# Patient Record
Sex: Female | Born: 1935 | Race: Black or African American | Hispanic: No | Marital: Married | Smoking: Former smoker
Health system: Southern US, Community
[De-identification: ages and names within clinical notes are randomized; demographics above are authoritative.]

## PROBLEM LIST (undated history)

## (undated) DIAGNOSIS — L8990 Pressure ulcer of unspecified site, unspecified stage: Secondary | ICD-10-CM

## (undated) DIAGNOSIS — M1990 Unspecified osteoarthritis, unspecified site: Secondary | ICD-10-CM

## (undated) DIAGNOSIS — I10 Essential (primary) hypertension: Secondary | ICD-10-CM

## (undated) DIAGNOSIS — R350 Frequency of micturition: Secondary | ICD-10-CM

## (undated) DIAGNOSIS — F0390 Unspecified dementia without behavioral disturbance: Secondary | ICD-10-CM

## (undated) DIAGNOSIS — K219 Gastro-esophageal reflux disease without esophagitis: Secondary | ICD-10-CM

## (undated) DIAGNOSIS — E785 Hyperlipidemia, unspecified: Secondary | ICD-10-CM

## (undated) DIAGNOSIS — N39498 Other specified urinary incontinence: Secondary | ICD-10-CM

## (undated) DIAGNOSIS — S31000A Unspecified open wound of lower back and pelvis without penetration into retroperitoneum, initial encounter: Secondary | ICD-10-CM

## (undated) DIAGNOSIS — T7840XA Allergy, unspecified, initial encounter: Secondary | ICD-10-CM

## (undated) DIAGNOSIS — F419 Anxiety disorder, unspecified: Secondary | ICD-10-CM

## (undated) HISTORY — PX: CHOLECYSTECTOMY: SHX55

## (undated) HISTORY — DX: Hyperlipidemia, unspecified: E78.5

## (undated) HISTORY — DX: Anxiety disorder, unspecified: F41.9

## (undated) HISTORY — PX: APPENDECTOMY: SHX54

## (undated) HISTORY — PX: ABDOMINAL HYSTERECTOMY: SHX81

## (undated) HISTORY — PX: COLON SURGERY: SHX602

## (undated) HISTORY — DX: Essential (primary) hypertension: I10

## (undated) HISTORY — DX: Allergy, unspecified, initial encounter: T78.40XA

## (undated) HISTORY — DX: Unspecified osteoarthritis, unspecified site: M19.90

## (undated) HISTORY — PX: COLONOSCOPY W/ POLYPECTOMY: SHX1380

---

## 1991-03-16 HISTORY — PX: BREAST SURGERY: SHX581

## 1998-03-15 HISTORY — PX: BACK SURGERY: SHX140

## 1998-08-16 ENCOUNTER — Emergency Department (HOSPITAL_COMMUNITY): Admission: EM | Admit: 1998-08-16 | Discharge: 1998-08-16 | Payer: Self-pay | Admitting: Emergency Medicine

## 1998-08-20 ENCOUNTER — Encounter: Payer: Self-pay | Admitting: Orthopedic Surgery

## 1998-08-21 ENCOUNTER — Inpatient Hospital Stay (HOSPITAL_COMMUNITY): Admission: RE | Admit: 1998-08-21 | Discharge: 1998-08-23 | Payer: Self-pay | Admitting: Orthopedic Surgery

## 1998-08-21 ENCOUNTER — Encounter: Payer: Self-pay | Admitting: Orthopedic Surgery

## 1999-05-06 ENCOUNTER — Encounter: Payer: Self-pay | Admitting: Orthopedic Surgery

## 1999-05-06 ENCOUNTER — Ambulatory Visit (HOSPITAL_COMMUNITY): Admission: RE | Admit: 1999-05-06 | Discharge: 1999-05-06 | Payer: Self-pay | Admitting: Orthopedic Surgery

## 1999-05-20 ENCOUNTER — Ambulatory Visit (HOSPITAL_COMMUNITY): Admission: RE | Admit: 1999-05-20 | Discharge: 1999-05-20 | Payer: Self-pay | Admitting: Orthopedic Surgery

## 1999-05-20 ENCOUNTER — Encounter: Payer: Self-pay | Admitting: Orthopedic Surgery

## 1999-06-12 ENCOUNTER — Encounter: Payer: Self-pay | Admitting: Orthopedic Surgery

## 1999-06-12 ENCOUNTER — Ambulatory Visit (HOSPITAL_COMMUNITY): Admission: RE | Admit: 1999-06-12 | Discharge: 1999-06-12 | Payer: Self-pay | Admitting: Orthopedic Surgery

## 2001-08-02 ENCOUNTER — Ambulatory Visit (HOSPITAL_COMMUNITY): Admission: RE | Admit: 2001-08-02 | Discharge: 2001-08-02 | Payer: Self-pay | Admitting: Gastroenterology

## 2007-06-14 LAB — CONVERTED CEMR LAB: Pap Smear: NORMAL

## 2008-05-28 ENCOUNTER — Encounter: Payer: Self-pay | Admitting: Family Medicine

## 2008-07-10 ENCOUNTER — Ambulatory Visit: Payer: Self-pay | Admitting: Family Medicine

## 2008-07-10 DIAGNOSIS — R32 Unspecified urinary incontinence: Secondary | ICD-10-CM

## 2008-07-10 DIAGNOSIS — I10 Essential (primary) hypertension: Secondary | ICD-10-CM | POA: Insufficient documentation

## 2008-07-10 DIAGNOSIS — E785 Hyperlipidemia, unspecified: Secondary | ICD-10-CM

## 2008-07-10 LAB — CONVERTED CEMR LAB
Bilirubin Urine: NEGATIVE
Nitrite: NEGATIVE
Urobilinogen, UA: 0.2

## 2008-08-05 ENCOUNTER — Ambulatory Visit: Payer: Self-pay | Admitting: Family Medicine

## 2008-08-06 ENCOUNTER — Encounter: Payer: Self-pay | Admitting: Family Medicine

## 2008-09-20 ENCOUNTER — Telehealth: Payer: Self-pay | Admitting: Family Medicine

## 2008-09-20 ENCOUNTER — Ambulatory Visit: Payer: Self-pay | Admitting: Family Medicine

## 2008-09-20 DIAGNOSIS — M169 Osteoarthritis of hip, unspecified: Secondary | ICD-10-CM

## 2008-09-20 DIAGNOSIS — M1610 Unilateral primary osteoarthritis, unspecified hip: Secondary | ICD-10-CM | POA: Insufficient documentation

## 2008-09-23 ENCOUNTER — Telehealth: Payer: Self-pay | Admitting: Family Medicine

## 2008-09-23 LAB — CONVERTED CEMR LAB
Alkaline Phosphatase: 68 units/L (ref 39–117)
BUN: 9 mg/dL (ref 6–23)
Bilirubin, Direct: 0 mg/dL (ref 0.0–0.3)
CO2: 31 meq/L (ref 19–32)
Chloride: 105 meq/L (ref 96–112)
Creatinine, Ser: 0.6 mg/dL (ref 0.4–1.2)
Glucose, Bld: 93 mg/dL (ref 70–99)
Total CHOL/HDL Ratio: 4
VLDL: 26 mg/dL (ref 0.0–40.0)

## 2008-10-03 ENCOUNTER — Encounter: Admission: RE | Admit: 2008-10-03 | Discharge: 2008-10-03 | Payer: Self-pay | Admitting: Orthopedic Surgery

## 2008-10-03 ENCOUNTER — Encounter: Payer: Self-pay | Admitting: Family Medicine

## 2008-11-13 ENCOUNTER — Telehealth: Payer: Self-pay | Admitting: Family Medicine

## 2008-11-26 ENCOUNTER — Encounter: Payer: Self-pay | Admitting: *Deleted

## 2008-11-27 ENCOUNTER — Ambulatory Visit: Payer: Self-pay | Admitting: Family Medicine

## 2008-12-09 ENCOUNTER — Telehealth: Payer: Self-pay | Admitting: Family Medicine

## 2009-01-09 ENCOUNTER — Ambulatory Visit: Payer: Self-pay | Admitting: Family Medicine

## 2009-01-10 LAB — CONVERTED CEMR LAB
LDL Cholesterol: 78 mg/dL (ref 0–99)
VLDL: 18.4 mg/dL (ref 0.0–40.0)

## 2009-01-16 ENCOUNTER — Telehealth (INDEPENDENT_AMBULATORY_CARE_PROVIDER_SITE_OTHER): Payer: Self-pay | Admitting: *Deleted

## 2009-01-27 ENCOUNTER — Telehealth: Payer: Self-pay | Admitting: Family Medicine

## 2009-02-20 ENCOUNTER — Ambulatory Visit: Payer: Self-pay | Admitting: Family Medicine

## 2009-04-04 ENCOUNTER — Telehealth: Payer: Self-pay | Admitting: Family Medicine

## 2009-04-25 ENCOUNTER — Ambulatory Visit: Payer: Self-pay | Admitting: Family Medicine

## 2009-04-25 DIAGNOSIS — R609 Edema, unspecified: Secondary | ICD-10-CM

## 2009-04-25 LAB — CONVERTED CEMR LAB
Bilirubin Urine: NEGATIVE
Glucose, Urine, Semiquant: NEGATIVE
Urobilinogen, UA: 0.2
pH: 6.5

## 2009-05-21 ENCOUNTER — Telehealth (INDEPENDENT_AMBULATORY_CARE_PROVIDER_SITE_OTHER): Payer: Self-pay | Admitting: *Deleted

## 2009-05-22 ENCOUNTER — Ambulatory Visit: Payer: Self-pay | Admitting: Family Medicine

## 2009-06-20 ENCOUNTER — Ambulatory Visit: Payer: Self-pay | Admitting: Family Medicine

## 2009-10-02 ENCOUNTER — Telehealth: Payer: Self-pay | Admitting: Family Medicine

## 2009-10-17 ENCOUNTER — Ambulatory Visit: Payer: Self-pay | Admitting: Family Medicine

## 2009-10-17 LAB — CONVERTED CEMR LAB: Cholesterol, target level: 200 mg/dL

## 2009-11-14 ENCOUNTER — Ambulatory Visit: Payer: Self-pay | Admitting: Family Medicine

## 2009-11-14 LAB — CONVERTED CEMR LAB
ALT: 11 U/L
AST: 18 U/L
Albumin: 3.8 g/dL
Alkaline Phosphatase: 59 U/L
BUN: 12 mg/dL
Bilirubin, Direct: 0.1 mg/dL
CO2: 31 meq/L
Calcium: 8.9 mg/dL
Chloride: 103 meq/L
Cholesterol: 172 mg/dL
Creatinine, Ser: 0.7 mg/dL
GFR calc non Af Amer: 105.52 mL/min
Glucose, Bld: 82 mg/dL
HDL: 57.5 mg/dL
LDL Cholesterol: 102 mg/dL — ABNORMAL HIGH
Potassium: 3.7 meq/L
Sodium: 140 meq/L
Total Bilirubin: 0.6 mg/dL
Total CHOL/HDL Ratio: 3
Total Protein: 6.4 g/dL
Triglycerides: 64 mg/dL
VLDL: 12.8 mg/dL

## 2009-11-20 ENCOUNTER — Encounter: Payer: Self-pay | Admitting: Family Medicine

## 2009-11-20 LAB — HM MAMMOGRAPHY

## 2009-12-15 ENCOUNTER — Telehealth: Payer: Self-pay | Admitting: Family Medicine

## 2009-12-16 ENCOUNTER — Ambulatory Visit: Payer: Self-pay | Admitting: Family Medicine

## 2009-12-16 DIAGNOSIS — N318 Other neuromuscular dysfunction of bladder: Secondary | ICD-10-CM

## 2009-12-16 LAB — CONVERTED CEMR LAB
Bilirubin Urine: NEGATIVE
Blood in Urine, dipstick: NEGATIVE
Ketones, urine, test strip: NEGATIVE
Protein, U semiquant: NEGATIVE
Urobilinogen, UA: 0.2

## 2009-12-30 ENCOUNTER — Telehealth: Payer: Self-pay | Admitting: Family Medicine

## 2010-01-16 ENCOUNTER — Ambulatory Visit: Payer: Self-pay | Admitting: Family Medicine

## 2010-01-27 ENCOUNTER — Ambulatory Visit: Payer: Self-pay | Admitting: Family Medicine

## 2010-01-29 LAB — CONVERTED CEMR LAB
OCCULT 1: NEGATIVE
OCCULT 2: NEGATIVE
OCCULT 3: NEGATIVE

## 2010-03-23 ENCOUNTER — Ambulatory Visit
Admission: RE | Admit: 2010-03-23 | Discharge: 2010-03-23 | Payer: Self-pay | Source: Home / Self Care | Attending: Family Medicine | Admitting: Family Medicine

## 2010-03-23 ENCOUNTER — Other Ambulatory Visit: Payer: Self-pay | Admitting: Family Medicine

## 2010-03-23 DIAGNOSIS — M545 Low back pain: Secondary | ICD-10-CM | POA: Insufficient documentation

## 2010-03-23 LAB — BASIC METABOLIC PANEL
BUN: 10 mg/dL (ref 6–23)
CO2: 30 mEq/L (ref 19–32)
Calcium: 9.5 mg/dL (ref 8.4–10.5)
Chloride: 107 mEq/L (ref 96–112)
Creatinine, Ser: 0.7 mg/dL (ref 0.4–1.2)
GFR: 103.71 mL/min (ref >60.00)
Glucose, Bld: 96 mg/dL (ref 70–99)
Potassium: 3.9 mEq/L (ref 3.5–5.1)
Sodium: 145 mEq/L (ref 135–145)

## 2010-03-23 LAB — CBC WITH DIFFERENTIAL/PLATELET
Basophils Absolute: 0 10*3/uL (ref 0.0–0.1)
Basophils Relative: 0.3 % (ref 0.0–3.0)
Eosinophils Absolute: 0.1 10*3/uL (ref 0.0–0.7)
Eosinophils Relative: 0.8 % (ref 0.0–5.0)
HCT: 42.5 % (ref 36.0–46.0)
Hemoglobin: 13.8 g/dL (ref 12.0–15.0)
Lymphocytes Relative: 37.4 % (ref 12.0–46.0)
Lymphs Abs: 3.3 10*3/uL (ref 0.7–4.0)
MCHC: 32.4 g/dL (ref 30.0–36.0)
MCV: 85.8 fl (ref 78.0–100.0)
Monocytes Absolute: 0.5 10*3/uL (ref 0.1–1.0)
Monocytes Relative: 5 % (ref 3.0–12.0)
Neutro Abs: 5 10*3/uL (ref 1.4–7.7)
Neutrophils Relative %: 56.5 % (ref 43.0–77.0)
Platelets: 192 10*3/uL (ref 150.0–400.0)
RBC: 4.95 Mil/uL (ref 3.87–5.11)
RDW: 14.6 % (ref 11.5–14.6)
WBC: 8.9 10*3/uL (ref 4.5–10.5)

## 2010-03-23 LAB — HEPATIC FUNCTION PANEL
ALT: 14 U/L (ref 0–35)
AST: 21 U/L (ref 0–37)
Albumin: 3.8 g/dL (ref 3.5–5.2)
Alkaline Phosphatase: 66 U/L (ref 39–117)
Bilirubin, Direct: 0.1 mg/dL (ref 0.0–0.3)
Total Bilirubin: 0.8 mg/dL (ref 0.3–1.2)
Total Protein: 6.9 g/dL (ref 6.0–8.3)

## 2010-03-23 LAB — CONVERTED CEMR LAB
Bilirubin Urine: NEGATIVE
Glucose, Urine, Semiquant: NEGATIVE
pH: 5.5

## 2010-03-23 LAB — SEDIMENTATION RATE: Sed Rate: 7 mm/hr (ref 0–22)

## 2010-03-31 ENCOUNTER — Telehealth: Payer: Self-pay | Admitting: Family Medicine

## 2010-04-07 ENCOUNTER — Ambulatory Visit
Admission: RE | Admit: 2010-04-07 | Discharge: 2010-04-07 | Payer: Self-pay | Source: Home / Self Care | Attending: Family Medicine | Admitting: Family Medicine

## 2010-04-16 NOTE — Assessment & Plan Note (Signed)
Summary: swollen feet   Vital Signs:  Patient profile:   76 year old female Menstrual status:  postmenopausal Weight:      160 pounds Temp:     98.7 degrees F oral BP sitting:   140 / 88  (left arm) Cuff size:   regular  Vitals Entered By: Sid Falcon LPN (May 22, 2009 4:12 PM) CC: legs, ankles, feet swelling ongoing, Hypertension Management   History of Present Illness: Patient seen with increased edema ankles and feet progressive over several weeks. No history of CHF. Denies any orthopnea, dyspnea with exertion, chest pains, or PND. She had some tendencies for edema in the past. No history of chronic kidney disease. Amlodipine 10 mg daily along with hydrochlorothiazide, losartan and Toprol-XL for blood pressure. Blood pressure relatively stable.  Slightly pruritic rash feet. Triamcinolone has helped pruritus somewhat but rash if anything seems to be spreading. Has not tried any topical antifungals.  Hypertension History:      She denies headache, chest pain, palpitations, dyspnea with exertion, PND, neurologic problems, and syncope.        Positive major cardiovascular risk factors include female age 16 years old or older, hyperlipidemia, and hypertension.     Allergies: 1)  ! Codeine Sulfate (Codeine Sulfate) 2)  Tramadol Hcl (Tramadol Hcl)  Past History:  Past Medical History: Last updated: 11/27/2008 Arthritis Hay Fever/Allergies Hyperlipidemia Hypertension neurogenic Urinary and bowel incontinence S/P disc surgery  6/00 UTI Anxiety, chronic--family stress Degenerative arthritis hips  Review of Systems       The patient complains of weight gain and peripheral edema.  The patient denies anorexia, fever, weight loss, chest pain, syncope, dyspnea on exertion, prolonged cough, headaches, hemoptysis, abdominal pain, and muscle weakness.    Physical Exam  General:  Well-developed,well-nourished,in no acute distress; alert,appropriate and cooperative throughout  examination Eyes:  pupils equal, pupils round, and pupils reactive to light.   Mouth:  Oral mucosa and oropharynx without lesions or exudates.  Teeth in good repair. Neck:  No deformities, masses, or tenderness noted. Lungs:  Normal respiratory effort, chest expands symmetrically. Lungs are clear to auscultation, no crackles or wheezes. Heart:  normal rate, regular rhythm, and no gallop.   Pulses:  2+ DP bil Extremities:  1 + edema ankles and legs bil. Neurologic:  strength normal in all extremities.   Skin:  erythematous scaly rash feet bil with fairly well demarcated borders.   Impression & Recommendations:  Problem # 1:  EDEMA (ICD-782.3) probably exacerbated by amlodipine. Reduced to 5 mg and increase losartan Her updated medication list for this problem includes:    Hydrochlorothiazide 25 Mg Tabs (Hydrochlorothiazide) ..... Once daily  Problem # 2:  SKIN RASH (ICD-782.1) stop triamcinolone and start clotrimazole cream. Keep feet as dry as possible. Her updated medication list for this problem includes:    Triamcinolone Acetonide 0.1 % Crea (Triamcinolone acetonide) .Marland Kitchen... Apply to affected rash two times a day as needed    Clotrimazole 1 % Crea (Clotrimazole) .Marland Kitchen... Apply to affected rash two times a day  Problem # 3:  HYPERTENSION (ICD-401.9) blood pressure medication changes as previously outlined and reassess one month Her updated medication list for this problem includes:    Amlodipine Besylate 5 Mg Tabs (Amlodipine besylate) ..... One by mouth once daily    Hydrochlorothiazide 25 Mg Tabs (Hydrochlorothiazide) ..... Once daily    Toprol Xl 50 Mg Xr24h-tab (Metoprolol succinate) ..... Once daily    Losartan Potassium 100 Mg Tabs (Losartan potassium) ..... One  by mouth once daily  Complete Medication List: 1)  Amlodipine Besylate 5 Mg Tabs (Amlodipine besylate) .... One by mouth once daily 2)  Estrace 0.5 Mg Tabs (Estradiol) .... One tab two times a day 3)   Hydrochlorothiazide 25 Mg Tabs (Hydrochlorothiazide) .... Once daily 4)  Simvastatin 20 Mg Tabs (Simvastatin) .... Once daily 5)  Hydrocodone-acetaminophen 5-500 Mg Tabs (Hydrocodone-acetaminophen) .Marland Kitchen.. 1-2 tabs every 6 hours as needed for back pain 6)  Benefiber Tabs (Wheat dextrin) 7)  Toprol Xl 50 Mg Xr24h-tab (Metoprolol succinate) .... Once daily 8)  Caltrate 600+d 600-400 Mg-unit Tabs (Calcium carbonate-vitamin d) .... Once daily 9)  Carisoprodol 350 Mg Tabs (Carisoprodol) .... At bedtime as needed 10)  Losartan Potassium 100 Mg Tabs (Losartan potassium) .... One by mouth once daily 11)  Astelin 137 Mcg/spray Soln (Azelastine hcl) .Marland Kitchen.. 1-2 sprays per nostril bid 12)  Flonase 50 Mcg/act Susp (Fluticasone propionate) .... 2 puffs per nostril once daily 13)  Triamcinolone Acetonide 0.1 % Crea (Triamcinolone acetonide) .... Apply to affected rash two times a day as needed 14)  Clotrimazole 1 % Crea (Clotrimazole) .... Apply to affected rash two times a day  Hypertension Assessment/Plan:      The patient's hypertensive risk group is category B: At least one risk factor (excluding diabetes) with no target organ damage.  Her calculated 10 year risk of coronary heart disease is 9 %.  Today's blood pressure is 140/88.    Patient Instructions: 1)  Please schedule a follow-up appointment in 1 month.  2)  Limit your Sodium(salt) .  Prescriptions: LOSARTAN POTASSIUM 100 MG TABS (LOSARTAN POTASSIUM) one by mouth once daily  #30 x 6   Entered and Authorized by:   Evelena Peat MD   Signed by:   Evelena Peat MD on 05/22/2009   Method used:   [facility]  [place] [address]       [address]       [place], [place]  [address]       Ph: [phone] or [phone]       Fax: [phone]   RxID:   [id number] AMLODIPINE BESYLATE 5 MG TABS (AMLODIPINE BESYLATE) one by mouth once daily  #30 x 6   Entered and Authorized by:   Evelena Peat MD   Signed by:   Evelena Peat MD on 05/22/2009   Method used:   [facility]        [facility]  [place] [address]       [address]       [place], [place]  [address]       Ph: [phone] or [phone]       Fax: [phone]   RxID:   [id number] CLOTRIMAZOLE 1 % CREA (CLOTRIMAZOLE) apply to affected rash two times a day  #15 gm x 1   Entered and Authorized by:   Evelena Peat MD   Signed by:   Evelena Peat MD on 05/22/2009   Method used:   [facility]        [facility]  [place] [address]       [address]       [place], [place]  [address]       Ph: [phone] or [phone]       Fax: [phone]   RxID:   [id number]

## 2010-04-16 NOTE — Assessment & Plan Note (Signed)
Summary: 4 month fup//ccm   Vital Signs:  Patient profile:   76 year old female Menstrual status:  postmenopausal Weight:      161 pounds Temp:     98.6 degrees F oral BP sitting:   130 / 72  (left arm) Cuff size:   regular  Vitals Entered By: Sid Falcon LPN (October 17, 2009 1:11 PM) CC: 4 month follow-up, Hypertension Management, Lipid Management   History of Present Illness: Patient here for followup multiple medical problems as below.  Osteoarthritis involving mostly her hips. Cortisone injection left hip since last visit-per orthopedist. This has improved greatly. Now has some right hip pains. Takes oxycodone very sporadically.  Hypertension. Blood pressure controlled by home readings. No side effects from medication other than mild swelling from amlodipine. No orthostatic dizziness.  Hyperlipidemia. Compliant with simvastatin. No myalgias. Needs repeat lipids soon.  Social Hx signif for daughter age 94 passed away last month from complications of acute leukemia.  Pt has supportive church and family and is coping well.  She has now lost 4 of 5 children.  Hypertension History:      She denies headache, chest pain, palpitations, dyspnea with exertion, orthopnea, PND, visual symptoms, neurologic problems, syncope, and side effects from treatment.        Positive major cardiovascular risk factors include female age 57 years old or older, hyperlipidemia, and hypertension.  Negative major cardiovascular risk factors include no history of diabetes.        Further assessment for target organ damage reveals no history of ASHD, stroke/TIA, or peripheral vascular disease.    Lipid Management History:      Positive NCEP/ATP III risk factors include female age 75 years old or older and hypertension.  Negative NCEP/ATP III risk factors include no history of early menopause without estrogen hormone replacement, non-diabetic, no ASHD (atherosclerotic heart disease), no prior stroke/TIA, no  peripheral vascular disease, and no history of aortic aneurysm.      Allergies: 1)  ! Codeine Sulfate (Codeine Sulfate) 2)  Tramadol Hcl (Tramadol Hcl)  Past History:  Past Medical History: Last updated: 11/27/2008 Arthritis Hay Fever/Allergies Hyperlipidemia Hypertension neurogenic Urinary and bowel incontinence S/P disc surgery  6/00 UTI Anxiety, chronic--family stress Degenerative arthritis hips  Social History: Last updated: 07/10/2008 Married, housewife Alcohol use-no Previous smoker PMH reviewed for relevance, SH/Risk Factors reviewed for relevance  Review of Systems  The patient denies anorexia, fever, weight loss, chest pain, syncope, dyspnea on exertion, prolonged cough, headaches, and abdominal pain.    Physical Exam  General:  Well-developed,well-nourished,in no acute distress; alert,appropriate and cooperative throughout examination Head:  Normocephalic and atraumatic without obvious abnormalities. No apparent alopecia or balding. Ears:  minimal cerumen in both ear canals Mouth:  Oral mucosa and oropharynx without lesions or exudates.  Teeth in good repair. Neck:  No deformities, masses, or tenderness noted. Lungs:  Normal respiratory effort, chest expands symmetrically. Lungs are clear to auscultation, no crackles or wheezes. Heart:  regular rhythm and rate with 2/6 systolic murmur right upper sternal border Extremities:  trace edema nonpitting lower legs bilaterally Skin:  no rashes.   Cervical Nodes:  No lymphadenopathy noted Psych:  good eye contact, not anxious appearing, and not depressed appearing.     Impression & Recommendations:  Problem # 1:  DEGENERATIVE JOINT DISEASE, HIPS (ICD-715.95)  Her updated medication list for this problem includes:    Oxycodone-acetaminophen 5-325 Mg Tabs (Oxycodone-acetaminophen) .Marland Kitchen... 1-2 by mouth q 6 hours as needed pain  Problem # 2:  HYPERTENSION (ICD-401.9)  Her updated medication list for this problem  includes:    Amlodipine Besylate 5 Mg Tabs (Amlodipine besylate) ..... One by mouth once daily    Hydrochlorothiazide 25 Mg Tabs (Hydrochlorothiazide) ..... Once daily    Toprol Xl 50 Mg Xr24h-tab (Metoprolol succinate) ..... Once daily    Losartan Potassium 100 Mg Tabs (Losartan potassium) ..... One by mouth once daily  Problem # 3:  HYPERLIPIDEMIA (ICD-272.4) schedule labs. Her updated medication list for this problem includes:    Simvastatin 20 Mg Tabs (Simvastatin) ..... Once daily  Complete Medication List: 1)  Amlodipine Besylate 5 Mg Tabs (Amlodipine besylate) .... One by mouth once daily 2)  Estrace 0.5 Mg Tabs (Estradiol) .... One tab two times a day 3)  Hydrochlorothiazide 25 Mg Tabs (Hydrochlorothiazide) .... Once daily 4)  Simvastatin 20 Mg Tabs (Simvastatin) .... Once daily 5)  Oxycodone-acetaminophen 5-325 Mg Tabs (Oxycodone-acetaminophen) .Marland Kitchen.. 1-2 by mouth q 6 hours as needed pain 6)  Benefiber Tabs (Wheat dextrin) 7)  Toprol Xl 50 Mg Xr24h-tab (Metoprolol succinate) .... Once daily 8)  Caltrate 600+d 600-400 Mg-unit Tabs (Calcium carbonate-vitamin d) .... Once daily 9)  Carisoprodol 350 Mg Tabs (Carisoprodol) .... At bedtime as needed 10)  Losartan Potassium 100 Mg Tabs (Losartan potassium) .... One by mouth once daily 11)  Astelin 137 Mcg/spray Soln (Azelastine hcl) .Marland Kitchen.. 1-2 sprays per nostril bid 12)  Flonase 50 Mcg/act Susp (Fluticasone propionate) .... 2 puffs per nostril once daily 13)  Triamcinolone Acetonide 0.1 % Crea (Triamcinolone acetonide) .... Apply to affected rash two times a day as needed 14)  Clotrimazole 1 % Crea (Clotrimazole) .... Apply to affected rash two times a day  Hypertension Assessment/Plan:      The patient's hypertensive risk group is category B: At least one risk factor (excluding diabetes) with no target organ damage.  Her calculated 10 year risk of coronary heart disease is 7 %.  Today's blood pressure is 130/72.    Lipid  Assessment/Plan:      Based on NCEP/ATP III, the patient's risk factor category is "0-1 risk factors".  The patient's lipid goals are as follows: Total cholesterol goal is 200; LDL cholesterol goal is 130; HDL cholesterol goal is 40; Triglyceride goal is 150.    Patient Instructions: 1)  [place] the following labs in 1-2 months: 2)   lipid and hepatic  272.4 3)  BMP   401.9 4)  Please schedule a follow-up appointment in 3 months .  Prescriptions: TOPROL XL 50 MG XR24H-TAB (METOPROLOL SUCCINATE) once daily  #30 Tablet x 6   Entered and Authorized by:   Evelena Peat MD   Signed by:   Evelena Peat MD on 10/17/2009   Method used:   [facility]        [facility]  [place] [address]       [address]       [place], [place]  [address]       Ph: [phone] or [phone]       Fax: [phone]   RxID:   [id number] HYDROCHLOROTHIAZIDE 25 MG TABS (HYDROCHLOROTHIAZIDE) once daily  #30 x 6   Entered and Authorized by:   Evelena Peat MD   Signed by:   Evelena Peat MD on 10/17/2009   Method used:   [facility]        [facility] [address] [phone][facility] ([facility]ail)       [address]       [place], [place]  [address]       Ph: [phone] or [phone]       Fax: [phone]   RxID:   [id number]

## 2010-04-16 NOTE — Progress Notes (Signed)
Summary: sample NS or different med  Phone Note Call from Patient Call back at Home Phone [phone]   Caller: live Call For: [name] Frank Summary of Call: Sample or different nasal spray needed.  Dr. B said he'd give sample if avail.  The azelastine generic costs $42.  It has helped but too expensive.  [facility]. Initial call taken by: Rudy Jew, RN,  April 04, 2009 11:56 AM  Follow-up for Phone Call        There are no less expesive nasal antihistamine options.  We could put her on generic Flonase but I think she has been on in past without much improvement.  If she is willing to try, let's get back on Flonase nasal 2 sprays per nostril once daily. Follow-up by: Evelena Peat MD,  April 04, 2009 12:13 PM    New/Updated Medications: FLONASE 50 MCG/ACT SUSP (FLUTICASONE PROPIONATE) 2 puffs per nostril once daily Prescriptions: FLONASE 50 MCG/ACT SUSP (FLUTICASONE PROPIONATE) 2 puffs per nostril once daily  #1 x 5   Entered by:   Rudy Jew, RN   Authorized by:   Evelena Peat MD   Signed by:   Rudy Jew, RN on 04/04/2009   Method used:   [facility]  [place] [address] [facility]       [address]       [place], [place]  [address]       Ph: [phone] or [phone]       Fax: [phone]   RxID:   [id number]

## 2010-04-16 NOTE — Progress Notes (Signed)
Summary: Amlodipine cost went up  Phone Note Call from Patient Call back at Home Phone [phone]   Caller: Patient------triage vm******* Call For: Evelena Peat MD Summary of Call: pt would like to knoa why her  AMLODIPINE BESYLATE 5 MG TABS one by mouth once daily, has went from $5.00 to $23.00? please return call. Initial call taken by: Warnell Forester,  March 31, 2010 4:15 PM  Follow-up for Phone Call        Pt will check with pharmacist for equilivent alternatives and call back with options Follow-up by: Sid Falcon LPN,  March 31, 2010 5:31 PM

## 2010-04-16 NOTE — Progress Notes (Signed)
Summary: ensure  Phone Note Call from Patient Call back at Home Phone [phone]   Caller: Patient Call For: Evelena Peat MD Reason for Call: Talk to Doctor Summary of Call: patient would like to know if it safe for her to drink ensure with the medications she is taking. Initial call taken by: Kern Reap CMA Duncan Dull),  October 02, 2009 12:00 PM  Follow-up for Phone Call        yes Follow-up by: Evelena Peat MD,  October 02, 2009 1:17 PM  Additional Follow-up for Phone Call Additional follow up Details #1::        Phone Call Completed Additional Follow-up by: Kern Reap CMA Duncan Dull),  October 02, 2009 2:13 PM

## 2010-04-16 NOTE — Assessment & Plan Note (Signed)
Summary: leg pain/dm   Vital Signs:  Patient profile:   76 year old female Menstrual status:  postmenopausal Weight:      161 pounds Temp:     98.7 degrees F oral BP sitting:   140 / 88  (left arm) Cuff size:   regular  Vitals Entered By: Sid Falcon LPN (March 23, 2010 10:01 AM)  History of Present Illness:       This is a 76 year old woman who presents with Back Pain.  The patient reports weakness and loss of sensation, but denies fever, chills, and urinary incontinence.  The pain is located in the right low back.  The pain began at home and gradually.  The pain radiates to the right buttock.  The pain is made worse by standing or walking, flexion, and activity.  The pain is made better by inactivity.  Risk factors for serious underlying conditions include duration of pain > 1 month and age >= 50 years.    Surgery ?level 2000 and some pain off and on since then.  Hypertension History:      She denies headache, chest pain, palpitations, dyspnea with exertion, orthopnea, peripheral edema, neurologic problems, and syncope.        Positive major cardiovascular risk factors include female age 47 years old or older, hyperlipidemia, and hypertension.  Negative major cardiovascular risk factors include no history of diabetes and negative family history for ischemic heart disease.        Further assessment for target organ damage reveals no history of ASHD, stroke/TIA, or peripheral vascular disease.     Allergies: 1)  ! Codeine Sulfate (Codeine Sulfate) 2)  Tramadol Hcl (Tramadol Hcl)  Past History:  Past Medical History: Last updated: 11/27/2008 Arthritis Hay Fever/Allergies Hyperlipidemia Hypertension neurogenic Urinary and bowel incontinence S/P disc surgery  6/00 UTI Anxiety, chronic--family stress Degenerative arthritis hips  Past Surgical History: Last updated: 07/10/2008 Appendectomy  1981 Cholecystectomy  1974 Hysterectomy  1981 Breast biopsy 1993 Back Surgery  2000  Family History: Last updated: 08/06/2008 Family History of Alcoholism/Addiction Family History of Arthritis Family History Hypertension, parent Family History of Cardiovascular disorder Colon cancer Lung cancer Prostate cancer Stroke Emotional Illness Diabetes  (mother)  Social History: 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    Physical Exam  General:  Well-developed,well-nourished,in no acute distress; alert,appropriate and cooperative throughout examination Eyes:  pupils equal, pupils round, and pupils reactive to light.   Ears:  External ear exam shows no significant lesions or deformities.  Otoscopic examination reveals clear canals, tympanic membranes are intact bilaterally without bulging, retraction, inflammation or discharge. Hearing is grossly normal bilaterally. Mouth:  Oral mucosa and oropharynx without lesions or exudates.  Teeth in good repair. Neck:  No deformities, masses, or tenderness noted. Lungs:  Normal respiratory effort, chest expands symmetrically. Lungs are clear to auscultation, no crackles or wheezes. Heart:  normal rate and regular rhythm.   Msk:  tender lower lumbar area. Extremities:  no calf tenderness.   no pitting edema. Neurologic:  alert & oriented X3, strength normal in all extremities, and sensation intact to light touch.   Psych:  normally interactive, good eye contact, not anxious appearing, and not depressed appearing.     Impression & Recommendations:  Problem # 1:  LUMBAGO (ICD-724.2)  Her updated medication list for this problem includes:    Oxycodone-acetaminophen 5-325 Mg Tabs (Oxycodone-acetaminophen) .Marland Kitchen... 1-2 by mouth q 6 hours as needed pain     Carisoprodol 350 Mg Tabs (Carisoprodol) .Marland Kitchen... At bedtime as needed    Meloxicam 15 Mg Tabs (Meloxicam) ..... One by mouth once daily  Orders: UA Dipstick w/o Micro (automated)  (81003) Specimen Handling (98119) Venipuncture (14782) TLB-CBC Platelet - w/Differential (85025-CBCD) TLB-Hepatic/Liver Function Pnl (80076-HEPATIC) TLB-BMP (Basic Metabolic Panel-BMET) (80048-METABOL) TLB-Sedimentation Rate (ESR) (85652-ESR) T-Lumbar Spine Complete, 5 Views (71110TC)  Problem # 2:  HYPERTENSION (ICD-401.9)  Her updated medication list for this problem includes:    Amlodipine Besylate 5 Mg Tabs (Amlodipine besylate) ..... One by mouth once daily    Hydrochlorothiazide 25 Mg Tabs (Hydrochlorothiazide) ..... Once daily    Toprol Xl 50 Mg Xr24h-tab (Metoprolol succinate) ..... Once daily    Losartan Potassium 100 Mg Tabs (Losartan potassium) ..... One by mouth once daily  Complete Medication List: 1)  Amlodipine Besylate 5 Mg Tabs (Amlodipine besylate) .... One by mouth once daily 2)  Estrace 0.5 Mg Tabs (Estradiol) .... One tab two times a day 3)  Hydrochlorothiazide 25 Mg Tabs (Hydrochlorothiazide) .... Once daily 4)  Simvastatin 20 Mg Tabs (Simvastatin) .... Once daily 5)  Oxycodone-acetaminophen 5-325 Mg Tabs (Oxycodone-acetaminophen) .Marland Kitchen.. 1-2 by mouth q 6 hours as needed pain 6)  Benefiber Tabs (Wheat dextrin) 7)  Toprol Xl 50 Mg Xr24h-tab (Metoprolol succinate) .... Once daily 8)  Caltrate 600+d 600-400 Mg-unit Tabs (Calcium carbonate-vitamin d) .... Once daily 9)  Carisoprodol 350 Mg Tabs (Carisoprodol) .... At bedtime as needed 10)  Losartan Potassium 100 Mg Tabs (Losartan potassium) .... One by mouth once daily 11)  Astelin 137 Mcg/spray Soln (Azelastine hcl) .Marland Kitchen.. 1-2 sprays per nostril bid 12)  Flonase 50 Mcg/act Susp (Fluticasone propionate) .... 2 puffs per nostril once daily 13)  Triamcinolone Acetonide 0.1 % Crea (Triamcinolone acetonide) .... Apply to affected rash two times a day  as needed 14)  Clotrimazole 1 % Crea (Clotrimazole) .... Apply to affected rash two times a day 15)  Meloxicam 15 Mg Tabs (Meloxicam) .... One by mouth once daily  Hypertension Assessment/Plan:      The patient's hypertensive risk group is category B: At least one risk factor (excluding diabetes) with no target organ damage.  Her calculated 10 year risk of coronary heart disease is 13 %.  Today's blood pressure is 140/88.    Patient Instructions: 1)  Please schedule a follow-up appointment in 2 weeks.    Orders Added: 1)  UA Dipstick w/o Micro (automated)  [81003] 2)  Specimen Handling [99000] 3)  Venipuncture [36415] 4)  TLB-CBC Platelet - w/Differential [85025-CBCD] 5)  TLB-Hepatic/Liver Function Pnl [80076-HEPATIC] 6)  TLB-BMP (Basic Metabolic Panel-BMET) [80048-METABOL] 7)  TLB-Sedimentation Rate (ESR) [85652-ESR] 8)  T-Lumbar Spine Complete, 5 Views [71110TC] 9)  Est. Patient Level IV [95621]    Laboratory Results   Urine Tests    Routine Urinalysis   Color: yellow Appearance: Clear Glucose: negative   (Normal Range: Negative) Bilirubin: negative   (Normal Range: Negative) Ketone: trace (5)   (Normal Range: Negative) Spec. Gravity: 1.020   (Normal Range: 1.003-1.035) Blood: negative   (Normal Range: Negative) pH: 5.5   (Normal Range: 5.0-8.0) Protein: negative   (Normal Range: Negative) Urobilinogen: 0.2   (Normal Range: 0-1) Nitrite: negative   (Normal Range: Negative) Leukocyte Esterace: negative   (Normal Range: Negative)    Comments: Rita Ohara  March 23, 2010 11:56 AM

## 2010-04-16 NOTE — Assessment & Plan Note (Signed)
Summary: 1 MO FU/LEGS STILL BOTHERING/PS   Vital Signs:  Patient profile:   76 year old female Menstrual status:  postmenopausal Weight:      164 pounds Temp:     97.7 degrees F oral BP sitting:   148 / 88  Vitals Entered By: Sid Falcon LPN (June 20, 6385 1:27 PM) CC: One month follow-up   History of Present Illness: Patient seen with mostly left lower extremity pain progressive over several weeks if not months. Pain poorly localized. Radiates from hip down toward in thigh region mostly. Achy quality. No claudication symptoms. No recent fall. History of known degenerative arthritis of hips. Has tried hydrocodone without relief. Difficulty sleeping secondary to pain. Does have history of some low back difficulties but currently no low back pain.  Symptoms have been progressive. X-rays last July revealed severe bilateral degenerative changes in both hips.  Allergies: 1)  ! Codeine Sulfate (Codeine Sulfate) 2)  Tramadol Hcl (Tramadol Hcl)  Past History:  Past Medical History: Last updated: 11/27/2008 Arthritis Hay Fever/Allergies Hyperlipidemia Hypertension neurogenic Urinary and bowel incontinence S/P disc surgery  6/00 UTI Anxiety, chronic--family stress Degenerative arthritis hips  Past Surgical History: Last updated: 07/10/2008 Appendectomy  1981 Cholecystectomy  1974 Hysterectomy  1981 Breast biopsy 1993 Back Surgery 2000  Social History: Last updated: 07/10/2008 Married, housewife Alcohol use-no Previous smoker PMH reviewed for relevance, PSH reviewed for relevance, SH/Risk Factors reviewed for relevance  Review of Systems      See HPI  Physical Exam  General:  Well-developed,well-nourished,in no acute distress; alert,appropriate and cooperative throughout examination Lungs:  Normal respiratory effort, chest expands symmetrically. Lungs are clear to auscultation, no crackles or wheezes. Heart:  normal rate and regular rhythm.   Extremities:  patient  has trace edema lower legs bilaterally. Very limited range of motion left hip. Limited but to a lesser extent range of motion right hip. Straight leg raise is negative. Neurologic:  normal sensory function lower extremities. Symmetric reflexes. No focal strength deficits.   Impression & Recommendations:  Problem # 1:  DEGENERATIVE JOINT DISEASE, HIPS (ICD-715.95)  symptoms progressive. Poor relief with hydrocodone. Prescription for oxycodone and referral back to orthopedist.  Suspect she may need consideration for L THR soon. Her updated medication list for this problem includes:    Oxycodone-acetaminophen 5-325 Mg Tabs (Oxycodone-acetaminophen) .Marland Kitchen... 1-2 by mouth q 6 hours as needed pain  Orders: Orthopedic Surgeon Referral (Ortho Surgeon)  Complete Medication List: 1)  Amlodipine Besylate 5 Mg Tabs (Amlodipine besylate) .... One by mouth once daily 2)  Estrace 0.5 Mg Tabs (Estradiol) .... One tab two times a day 3)  Hydrochlorothiazide 25 Mg Tabs (Hydrochlorothiazide) .... Once daily 4)  Simvastatin 20 Mg Tabs (Simvastatin) .... Once daily 5)  Oxycodone-acetaminophen 5-325 Mg Tabs (Oxycodone-acetaminophen) .Marland Kitchen.. 1-2 by mouth q 6 hours as needed pain 6)  Benefiber Tabs (Wheat dextrin) 7)  Toprol Xl 50 Mg Xr24h-tab (Metoprolol succinate) .... Once daily 8)  Caltrate 600+d 600-400 Mg-unit Tabs (Calcium carbonate-vitamin d) .... Once daily 9)  Carisoprodol 350 Mg Tabs (Carisoprodol) .... At bedtime as needed 10)  Losartan Potassium 100 Mg Tabs (Losartan potassium) .... One by mouth once daily 11)  Astelin 137 Mcg/spray Soln (Azelastine hcl) .Marland Kitchen.. 1-2 sprays per nostril bid 12)  Flonase 50 Mcg/act Susp (Fluticasone propionate) .... 2 puffs per nostril once daily 13)  Triamcinolone Acetonide 0.1 % Crea (Triamcinolone acetonide) .... Apply to affected rash two times a day as needed 14)  Clotrimazole 1 % Crea (  Clotrimazole) .... Apply to affected rash two times a day  Patient Instructions: 1)   Please schedule a follow-up appointment in 4 months .  Prescriptions: OXYCODONE-ACETAMINOPHEN 5-325 MG TABS (OXYCODONE-ACETAMINOPHEN) 1-2 by mouth q 6 hours as needed pain  #40 x 0   Entered and Authorized by:   Evelena Peat MD   Signed by:   Evelena Peat MD on 06/20/2009   Method used:   Print then Give to Patient   RxID:   [id number]

## 2010-04-16 NOTE — Assessment & Plan Note (Signed)
Summary: MEDS/LEGS HURTING/URINE SPECIMEN CHECK/COMPRESSION STOCKING ?/PS   Vital Signs:  Patient profile:   76 year old female Menstrual status:  postmenopausal Temp:     97.7 degrees F oral Resp:     97.7 per minute BP sitting:   142 / 82  (left arm) Cuff size:   regular  Vitals Entered By: Sid Falcon LPN (April 25, 2009 4:46 PM) CC: Legs hurting off and on X 2 weeks, check UA   History of Present Illness: Patient is seen for the following items today.  Chronic edema lower extremities. Previously used compression garments which all. Complains of frequent spitting and burning sensation. No history of diabetes or known neuropathy. Major complaint is edema which is usually worse late day. No shortness of breath.  Pruritic erythematous rash right foot greater than left. Conforms to contact areas with shoe. No known contact allergy. Using Neosporin without improvement.  Pt requests UA.  Occ frequency.  No burning.  Allergies: 1)  ! Codeine Sulfate (Codeine Sulfate) 2)  Tramadol Hcl (Tramadol Hcl)  Past History:  Past Medical History: Last updated: 11/27/2008 Arthritis Hay Fever/Allergies Hyperlipidemia Hypertension neurogenic Urinary and bowel incontinence S/P disc surgery  6/00 UTI Anxiety, chronic--family stress Degenerative arthritis hips  Past Surgical History: Last updated: 07/10/2008 Appendectomy  1981 Cholecystectomy  1974 Hysterectomy  1981 Breast biopsy 1993 Back Surgery 2000 PMH reviewed for relevance  Review of Systems       The patient complains of peripheral edema.  The patient denies anorexia, fever, weight loss, chest pain, dyspnea on exertion, and prolonged cough.    Physical Exam  General:  Well-developed,well-nourished,in no acute distress; alert,appropriate and cooperative throughout examination Lungs:  Normal respiratory effort, chest expands symmetrically. Lungs are clear to auscultation, no crackles or wheezes. Heart:  normal rate  and regular rhythm.   Pulses:  good dorsalis pedis pulses bilaterally Extremities:  trace nonpitting edema lower legs ankles and feet bilaterally Skin:  erythematous macular slightly scaly rash involving right foot greater than left. No interdigital involvement. Rash most along medial aspect lesser extent lateral aspect of foot   Impression & Recommendations:  Problem # 1:  DERMATITIS, ALLERGIC (ICD-692.9) suspect contact allergy. Try triamcinolone cream. May need to change shoes Her updated medication list for this problem includes:    Triamcinolone Acetonide 0.1 % Crea (Triamcinolone acetonide) .Marland Kitchen... Apply to affected rash two times a day as needed  Problem # 2:  EDEMA (ICD-782.3) suspect largely venous stasis related. Get medical compression stocking-rx given. Her updated medication list for this problem includes:    Hydrochlorothiazide 25 Mg Tabs (Hydrochlorothiazide) ..... Once daily  Complete Medication List: 1)  Amlodipine Besylate 10 Mg Tabs (Amlodipine besylate) .... Once daily 2)  Estrace 0.5 Mg Tabs (Estradiol) .... One tab two times a day 3)  Hydrochlorothiazide 25 Mg Tabs (Hydrochlorothiazide) .... Once daily 4)  Simvastatin 20 Mg Tabs (Simvastatin) .... Once daily 5)  Hydrocodone-acetaminophen 5-500 Mg Tabs (Hydrocodone-acetaminophen) .Marland Kitchen.. 1-2 tabs every 6 hours as needed for back pain 6)  Benefiber Tabs (Wheat dextrin) 7)  Toprol Xl 50 Mg Xr24h-tab (Metoprolol succinate) .... Once daily 8)  Caltrate 600+d 600-400 Mg-unit Tabs (Calcium carbonate-vitamin d) .... Once daily 9)  Carisoprodol 350 Mg Tabs (Carisoprodol) .... At bedtime as needed 10)  Losartan Potassium 50 Mg Tabs (Losartan potassium) .... Once daily 11)  Astelin 137 Mcg/spray Soln (Azelastine hcl) .Marland Kitchen.. 1-2 sprays per nostril bid 12)  Flonase 50 Mcg/act Susp (Fluticasone propionate) .... 2 puffs per nostril once daily  13)  Triamcinolone Acetonide 0.1 % Crea (Triamcinolone acetonide) .... Apply to affected rash  two times a day as needed  Other Orders: UA Dipstick w/o Micro (manual) (91478)  Patient Instructions: 1)  Leave off Neosporin to leg rash. 2)  Elevate legs and feet frequently 3)  Use compression hose daily 4)  Walk regularly for exercise 5)  Please schedule a follow-up appointment in 4 months .  Prescriptions: TRIAMCINOLONE ACETONIDE 0.1 % CREA (TRIAMCINOLONE ACETONIDE) apply to affected rash two times a day as needed  #30 gm x 1   Entered and Authorized by:   Evelena Peat MD   Signed by:   Evelena Peat MD on 04/25/2009   Method used:   Electronically to        [facility]  [place] [address] (retail)       [address]       [place], [place]  [address]       Ph: [phone] or [phone]       Fax: [phone]   RxID:   [id number]   Laboratory Results   Urine Tests    Routine Urinalysis   Color: yellow Appearance: Hazy Glucose: negative   (Normal Range: Negative) Bilirubin: negative   (Normal Range: Negative) Ketone: negative   (Normal Range: Negative) Spec. Gravity: 1.010   (Normal Range: 1.003-1.035) Blood: trace-lysed   (Normal Range: Negative) pH: 6.5   (Normal Range: 5.0-8.0) Protein: negative   (Normal Range: Negative) Urobilinogen: 0.2   (Normal Range: 0-1) Nitrite: negative   (Normal Range: Negative) Leukocyte Esterace: negative   (Normal Range: Negative)    Comments: Sid Falcon LPN  April 25, 2009 4:58 PM

## 2010-04-16 NOTE — Assessment & Plan Note (Signed)
Summary: urinating alot/njr   Vital Signs:  Patient profile:   76 year old female Menstrual status:  postmenopausal Weight:      161 pounds BMI:     24.93 O2 Sat:      92 % Temp:     98.1 degrees F Pulse rate:   114 / minute BP sitting:   140 / 86  (left arm)  Vitals Entered By: Pura Spice, RN (December 16, 2009 8:40 AM) CC: urinary frequency notices this after taking her losartan    History of Present Illness: Here to ask about urinary frequency It sounds like this has been going on for years, but she wanted to talk about it today. No burning or discomfort, no incontinence. her BP has been stable, and a BMET one month ago was normal.   Allergies: 1)  ! Codeine Sulfate (Codeine Sulfate) 2)  Tramadol Hcl (Tramadol Hcl)  Past History:  Past Medical History: Reviewed history from 11/27/2008 and no changes required. Arthritis Hay Fever/Allergies Hyperlipidemia Hypertension neurogenic Urinary and bowel incontinence S/P disc surgery  6/00 UTI Anxiety, chronic--family stress Degenerative arthritis hips  Review of Systems  The patient denies anorexia, fever, weight loss, weight gain, vision loss, decreased hearing, hoarseness, chest pain, syncope, dyspnea on exertion, peripheral edema, prolonged cough, headaches, hemoptysis, abdominal pain, melena, hematochezia, severe indigestion/heartburn, hematuria, incontinence, genital sores, muscle weakness, suspicious skin lesions, transient blindness, difficulty walking, depression, unusual weight change, abnormal bleeding, enlarged lymph nodes, angioedema, breast masses, and testicular masses.         Flu Vaccine Consent Questions     Do you have a history of severe allergic reactions to this vaccine? no    Any prior history of allergic reactions to egg and/or gelatin? no    Do you have a sensitivity to the preservative Thimersol? no    Do you have a past history of Guillan-Barre Syndrome? no    Do you currently have an acute febrile  illness? no    Have you ever had a severe reaction to latex? no    Vaccine information given and explained to patient? yes    Are you currently pregnant? no    Lot Number:[id number]   Exp Date:09/12/2010   Site Given  Left Deltoid IM Pura Spice, RN  December 16, 2009 9:02 AM    Physical Exam  General:  Well-developed,well-nourished,in no acute distress; alert,appropriate and cooperative throughout examination Lungs:  Normal respiratory effort, chest expands symmetrically. Lungs are clear to auscultation, no crackles or wheezes. Heart:  Normal rate and regular rhythm. S1 and S2 normal without gallop, murmur, click, rub or other extra sounds. Abdomen:  Bowel sounds positive,abdomen soft and non-tender without masses, organomegaly or hernias noted. Extremities:  1+ left pedal edema and 1+ right pedal edema.     Impression & Recommendations:  Problem # 1:  OVERACTIVE BLADDER (ICD-596.51)  Orders: UA Dipstick w/o Micro (manual) (16109)  Problem # 2:  EDEMA (ICD-782.3)  Her updated medication list for this problem includes:    Hydrochlorothiazide 25 Mg Tabs (Hydrochlorothiazide) ..... Once daily  Problem # 3:  HYPERTENSION (ICD-401.9)  Her updated medication list for this problem includes:    Amlodipine Besylate 5 Mg Tabs (Amlodipine besylate) ..... One by mouth once daily    Hydrochlorothiazide 25 Mg Tabs (Hydrochlorothiazide) ..... Once daily    Toprol Xl 50 Mg Xr24h-tab (Metoprolol succinate) ..... Once daily    Losartan Potassium 100 Mg Tabs (Losartan potassium) ..... One by mouth  once daily  Complete Medication List: 1)  Amlodipine Besylate 5 Mg Tabs (Amlodipine besylate) .... One by mouth once daily 2)  Estrace 0.5 Mg Tabs (Estradiol) .... One tab two times a day 3)  Hydrochlorothiazide 25 Mg Tabs (Hydrochlorothiazide) .... Once daily 4)  Simvastatin 20 Mg Tabs (Simvastatin) .... Once daily 5)  Oxycodone-acetaminophen 5-325 Mg Tabs (Oxycodone-acetaminophen) .Marland Kitchen.. 1-2 by  mouth q 6 hours as needed pain 6)  Benefiber Tabs (Wheat dextrin) 7)  Toprol Xl 50 Mg Xr24h-tab (Metoprolol succinate) .... Once daily 8)  Caltrate 600+d 600-400 Mg-unit Tabs (Calcium carbonate-vitamin d) .... Once daily 9)  Carisoprodol 350 Mg Tabs (Carisoprodol) .... At bedtime as needed 10)  Losartan Potassium 100 Mg Tabs (Losartan potassium) .... One by mouth once daily 11)  Astelin 137 Mcg/spray Soln (Azelastine hcl) .Marland Kitchen.. 1-2 sprays per nostril bid 12)  Flonase 50 Mcg/act Susp (Fluticasone propionate) .... 2 puffs per nostril once daily 13)  Triamcinolone Acetonide 0.1 % Crea (Triamcinolone acetonide) .... Apply to affected rash two times a day as needed 14)  Clotrimazole 1 % Crea (Clotrimazole) .... Apply to affected rash two times a day  Other Orders: Flu Vaccine 68yrs + MEDICARE PATIENTS (Z6109) Administration Flu vaccine - MCR (U0454)  Patient Instructions: 1)  Reassured her that her kidneys are working fine, but that her bladder wants to empty itself too quickly. I told her that medications are available to help with this, but she prefers to think about this first. Follow up with Dr. Caryl Never.   Laboratory Results   Urine Tests  Date/Time Received: December 16, 2009 9:05 AM  Date/Time Reported: 9:05 AM   Routine Urinalysis   Color: yellow Appearance: Clear Glucose: negative   (Normal Range: Negative) Bilirubin: negative   (Normal Range: Negative) Ketone: negative   (Normal Range: Negative) Spec. Gravity: 1.020   (Normal Range: 1.003-1.035) Blood: negative   (Normal Range: Negative) pH: 5.0   (Normal Range: 5.0-8.0) Protein: negative   (Normal Range: Negative) Urobilinogen: 0.2   (Normal Range: 0-1) Nitrite: negative   (Normal Range: Negative) Leukocyte Esterace: negative   (Normal Range: Negative)    Comments: Pura Spice, RN  December 16, 2009 9:05 AM

## 2010-04-16 NOTE — Progress Notes (Signed)
Summary: swollen feet  Phone Note Call from Patient Call back at Home Phone [phone]   Caller: Patient Call For: [name] Peat MD Summary of Call: she says her feet are swollen and "breaking out"; you gave her some cream and she's using that. Should she see foot doctor?  [facility] Initial call taken by: Raechel Ache, RN,  May 21, 2009 4:39 PM  Follow-up for Phone Call        I think she has two separate issues.  First, she has edema which is likely exacerbated by Norvasc.  She can f/u up here and we might need to change her BP meds.  If rash not clearing with triamcinolone, she might wish to try OTC Lamisil cream.  If not responding to that dermatology would be more appropriate. Follow-up by: [name] Peat MD,  May 21, 2009 5:09 PM  Additional Follow-up for Phone Call Additional follow up Details #1::        appt for today Additional Follow-up by: Raechel Ache, RN,  May 22, 2009 8:42 AM

## 2010-04-16 NOTE — Assessment & Plan Note (Signed)
Summary: 2 WK ROV/MM   Vital Signs:  Patient profile:   76 year old female Menstrual status:  postmenopausal Weight:      165 pounds Temp:     98.5 degrees F oral BP sitting:   130 / 84  (left arm) Cuff size:   regular  Vitals Entered By: Sid Falcon LPN (April 07, 2010 9:48 AM)  History of Present Illness: Here for follow up back pain. No real change in back pain. X-rays showed degenerative changes.    Labs unremarkable.  No weight loss. No change is symptoms.  Taking oxycodone sporadically but does not like the way it makes her feel.  She has had prior lumbar epidurals with Dr Ethelene Hal in past. She is considering starting water exercise class.  No recent incontinence of stool or urine.  No weight loss and no appetite loss.  Allergies: 1)  ! Codeine Sulfate (Codeine Sulfate) 2)  Tramadol Hcl (Tramadol Hcl)  Past History:  Past Medical History: Last updated: 11/27/2008 Arthritis Hay Fever/Allergies Hyperlipidemia Hypertension neurogenic Urinary and bowel incontinence S/P disc surgery  6/00 UTI Anxiety, chronic--family stress Degenerative arthritis hips  Physical Exam  General:  Well-developed,well-nourished,in no acute distress; alert,appropriate and cooperative throughout examination Neck:  No deformities, masses, or tenderness noted. Lungs:  Normal respiratory effort, chest expands symmetrically. Lungs are clear to auscultation, no crackles or wheezes. Heart:  normal rate and regular rhythm.   Extremities:  SLRs neg.  no edema legs Neurologic:  strength normal in all extremities, sensation intact to light touch, gait normal, and DTRs symmetrical and normal.     Impression & Recommendations:  Problem # 1:  LUMBAGO (ICD-724.2) unchanged.  discussed options-refer back to ortho vs trial of water exercises and she prefers the later. Her updated medication list for this problem includes:    Oxycodone-acetaminophen 5-325 Mg Tabs (Oxycodone-acetaminophen) .Marland Kitchen...  1-2 by mouth q 6 hours as needed pain    Carisoprodol 350 Mg Tabs (Carisoprodol) .Marland Kitchen... At bedtime as needed    Meloxicam 15 Mg Tabs (Meloxicam) ..... One by mouth once daily  Complete Medication List: 1)  Amlodipine Besylate 5 Mg Tabs (Amlodipine besylate) .... One by mouth once daily 2)  Estrace 0.5 Mg Tabs (Estradiol) .... One tab two times a day 3)  Hydrochlorothiazide 25 Mg Tabs (Hydrochlorothiazide) .... Once daily 4)  Simvastatin 20 Mg Tabs (Simvastatin) .... Once daily 5)  Oxycodone-acetaminophen 5-325 Mg Tabs (Oxycodone-acetaminophen) .Marland Kitchen.. 1-2 by mouth q 6 hours as needed pain 6)  Benefiber Tabs (Wheat dextrin) 7)  Toprol Xl 50 Mg Xr24h-tab (Metoprolol succinate) .... Once daily 8)  Caltrate 600+d 600-400 Mg-unit Tabs (Calcium carbonate-vitamin d) .... Once daily 9)  Carisoprodol 350 Mg Tabs (Carisoprodol) .... At bedtime as needed 10)  Losartan Potassium 100 Mg Tabs (Losartan potassium) .... One by mouth once daily 11)  Astelin 137 Mcg/spray Soln (Azelastine hcl) .Marland Kitchen.. 1-2 sprays per nostril bid 12)  Flonase 50 Mcg/act Susp (Fluticasone propionate) .... 2 puffs per nostril once daily 13)  Triamcinolone Acetonide 0.1 % Crea (Triamcinolone acetonide) .... Apply to affected rash two times a day as needed 14)  Clotrimazole 1 % Crea (Clotrimazole) .... Apply to affected rash two times a day 15)  Meloxicam 15 Mg Tabs (Meloxicam) .... One by mouth once daily  Patient Instructions: 1)  Please schedule a follow-up appointment in 3 months .  2)  Be in touch if your back pain is worsening and we will consider referral back to Dr Ethelene Hal. 3)  Consider  water aerobic exercises.   Orders Added: 1)  Est. Patient Level III [98119]

## 2010-04-16 NOTE — Progress Notes (Signed)
Summary: please advise of Oxycodone  Phone Note Call from Patient Call back at Home Phone [phone]   Caller: [facility] mail Summary of Call: calling about her oxycodone. shoud she stay on this meds? please advise. Initial call taken by: Warnell Forester,  December 30, 2009 1:22 PM  Follow-up for Phone Call        Best to avoid regular use, if possible, to avoid dependency.  If her hip pain is recurring following steroid injection from orthopedist, she should be back in touch with them. Follow-up by: Evelena Peat MD,  December 30, 2009 5:38 PM  Additional Follow-up for Phone Call Additional follow up Details #1::        Pt needs something for occasional pain, she was given #40 in April. " Is there something else Dr Caryl Never would like me to try for occasional hip pain?"  Its not bad enough to go back to Ortho yet. Additional Follow-up by: Sid Falcon LPN,  December 31, 2009 12:59 PM    Additional Follow-up for Phone Call Additional follow up Details #2::    We can refil that med.  She is apparently taking very sparingly. Follow-up by: Evelena Peat MD,  December 31, 2009 5:28 PM  Additional Follow-up for Phone Call Additional follow up Details #3:: Details for Additional Follow-up Action Taken: Pt informed RX ready for pick-up Additional Follow-up by: Sid Falcon LPN,  January 01, 2010 8:02 AM  Prescriptions: OXYCODONE-ACETAMINOPHEN 5-325 MG TABS (OXYCODONE-ACETAMINOPHEN) 1-2 by mouth q 6 hours as needed pain  #40 x 0   Entered by:   Evelena Peat MD   Authorized by:   Sid Falcon LPN   Signed by:   Evelena Peat MD on 12/31/2009   Method used:   Print then Give to Patient   RxID:   [id number]

## 2010-04-16 NOTE — Progress Notes (Signed)
Summary: URINATING ALOT ?MED  Phone Note Call from Patient Call back at Home Phone [phone]   Caller: Patient Call For: Evelena Peat MD Summary of Call: PT HAS ? CONCERNING LOSARTAN-POTASSIUM MED SHE IS URINATING A LOT . PLEASE ADVISE Initial call taken by: Heron Sabins,  December 15, 2009 11:23 AM  Follow-up for Phone Call        make an OV to look at this  Follow-up by: Nelwyn Salisbury MD,  December 15, 2009 4:57 PM  Additional Follow-up for Phone Call Additional follow up Details #1::        ov 12-15-2009 8.30am Additional Follow-up by: Heron Sabins,  December 15, 2009 5:14 PM

## 2010-04-16 NOTE — Assessment & Plan Note (Signed)
Summary: 3 month fup//ccm   Vital Signs:  Patient profile:   76 year old female Menstrual status:  postmenopausal Weight:      161 pounds Temp:     98.1 degrees F oral BP sitting:   136 / 80  (left arm) Cuff size:   regular  Vitals Entered By: Sid Falcon LPN (January 16, 2010 8:38 AM)  History of Present Illness: Patient is seen for followup multiple medical problems.  Osteoarthritis mostly involving hips. Takes meloxicam 7.5 mg daily. Would like to consider titrating to 15 mg. Pain is moderate. Still ambulating without much difficulty.  Hypertension on multiple medications. These are reviewed. Patient compliant with all. No side effects.  Patient had recent [facility] screening in August. She requested a review her tests. She had testing of all components with the exception of abdominal aortic aneurysm screening. These were all normal.  Hyperlipidemia. Recent lipids reviewed with patient. Compliant with simvastatin  Hypertension History:      She denies headache, chest pain, palpitations, dyspnea with exertion, orthopnea, PND, peripheral edema, visual symptoms, neurologic problems, syncope, and side effects from treatment.        Positive major cardiovascular risk factors include female age 70 years old or older, hyperlipidemia, and hypertension.  Negative major cardiovascular risk factors include no history of diabetes and negative family history for ischemic heart disease.        Further assessment for target organ damage reveals no history of ASHD, stroke/TIA, or peripheral vascular disease.    Lipid Management History:      Positive NCEP/ATP III risk factors include female age 84 years old or older and hypertension.  Negative NCEP/ATP III risk factors include no history of early menopause without estrogen hormone replacement, non-diabetic, no family history for ischemic heart disease, no ASHD (atherosclerotic heart disease), no prior stroke/TIA, no peripheral vascular disease,  and no history of aortic aneurysm.      Allergies: 1)  ! Codeine Sulfate (Codeine Sulfate) 2)  Tramadol Hcl (Tramadol Hcl)  Past History:  Past Medical History: Last updated: 11/27/2008 Arthritis Hay Fever/Allergies Hyperlipidemia Hypertension neurogenic Urinary and bowel incontinence S/P disc surgery  6/00 UTI Anxiety, chronic--family stress Degenerative arthritis hips  Past Surgical History: Last updated: 07/10/2008 Appendectomy  1981 Cholecystectomy  1974 Hysterectomy  1981 Breast biopsy 1993 Back Surgery 2000  Family History: Last updated: 08/06/2008 Family History of Alcoholism/Addiction Family History of Arthritis Family History Hypertension, parent Family History of Cardiovascular disorder Colon cancer Lung cancer Prostate cancer Stroke Emotional Illness Diabetes  (mother)  Social History: Last updated: 07/10/2008 Married, housewife Alcohol use-no Previous smoker PMH-FH-SH reviewed for relevance  Review of Systems       The patient complains of peripheral edema.  The patient denies anorexia, fever, weight loss, weight gain, chest pain, syncope, dyspnea on exertion, prolonged cough, headaches, hemoptysis, abdominal pain, melena, hematochezia, severe indigestion/heartburn, muscle weakness, and depression.         peripheral edema is mild and controlled with support hose.  Physical Exam  General:  Well-developed,well-nourished,in no acute distress; alert,appropriate and cooperative throughout examination Ears:  External ear exam shows no significant lesions or deformities.  Otoscopic examination reveals clear canals, tympanic membranes are intact bilaterally without bulging, retraction, inflammation or discharge. Hearing is grossly normal bilaterally. Mouth:  Oral mucosa and oropharynx without lesions or exudates.  Teeth in good repair. Neck:  No deformities, masses, or tenderness noted. Lungs:  Normal respiratory effort, chest expands symmetrically.  Lungs are clear to auscultation, no  crackles or wheezes. Heart:  normal rate, regular rhythm, and no gallop.   Extremities:  support hose on .  No pitting edema. Neurologic:  alert & oriented X3 and cranial nerves II-XII intact.   Skin:  no rashes and no suspicious lesions.   Cervical Nodes:  No lymphadenopathy noted Psych:  normally interactive, good eye contact, not anxious appearing, and not depressed appearing.     Impression & Recommendations:  Problem # 1:  DEGENERATIVE JOINT DISEASE, HIPS (ICD-715.95) titrate Meloxicam to 15 mg. Her updated medication list for this problem includes:    Oxycodone-acetaminophen 5-325 Mg Tabs (Oxycodone-acetaminophen) .Marland Kitchen... 1-2 by mouth q 6 hours as needed pain    Meloxicam 15 Mg Tabs (Meloxicam) ..... One by mouth once daily  Problem # 2:  HYPERTENSION (ICD-401.9)  Her updated medication list for this problem includes:    Amlodipine Besylate 5 Mg Tabs (Amlodipine besylate) ..... One by mouth once daily    Hydrochlorothiazide 25 Mg Tabs (Hydrochlorothiazide) ..... Once daily    Toprol Xl 50 Mg Xr24h-tab (Metoprolol succinate) ..... Once daily    Losartan Potassium 100 Mg Tabs (Losartan potassium) ..... One by mouth once daily  Problem # 3:  HYPERLIPIDEMIA (ICD-272.4)  Her updated medication list for this problem includes:    Simvastatin 20 Mg Tabs (Simvastatin) ..... Once daily  Problem # 4:  EDEMA (ICD-782.3)  Her updated medication list for this problem includes:    Hydrochlorothiazide 25 Mg Tabs (Hydrochlorothiazide) ..... Once daily  Complete Medication List: 1)  Amlodipine Besylate 5 Mg Tabs (Amlodipine besylate) .... One by mouth once daily 2)  Estrace 0.5 Mg Tabs (Estradiol) .... One tab two times a day 3)  Hydrochlorothiazide 25 Mg Tabs (Hydrochlorothiazide) .... Once daily 4)  Simvastatin 20 Mg Tabs (Simvastatin) .... Once daily 5)  Oxycodone-acetaminophen 5-325 Mg Tabs (Oxycodone-acetaminophen) .Marland Kitchen.. 1-2 by mouth q 6 hours as  needed pain 6)  Benefiber Tabs (Wheat dextrin) 7)  Toprol Xl 50 Mg Xr24h-tab (Metoprolol succinate) .... Once daily 8)  Caltrate 600+d 600-400 Mg-unit Tabs (Calcium carbonate-vitamin d) .... Once daily 9)  Carisoprodol 350 Mg Tabs (Carisoprodol) .... At bedtime as needed 10)  Losartan Potassium 100 Mg Tabs (Losartan potassium) .... One by mouth once daily 11)  Astelin 137 Mcg/spray Soln (Azelastine hcl) .Marland Kitchen.. 1-2 sprays per nostril bid 12)  Flonase 50 Mcg/act Susp (Fluticasone propionate) .... 2 puffs per nostril once daily 13)  Triamcinolone Acetonide 0.1 % Crea (Triamcinolone acetonide) .... Apply to affected rash two times a day as needed 14)  Clotrimazole 1 % Crea (Clotrimazole) .... Apply to affected rash two times a day 15)  Meloxicam 15 Mg Tabs (Meloxicam) .... One by mouth once daily  Hypertension Assessment/Plan:      The patient's hypertensive risk group is category B: At least one risk factor (excluding diabetes) with no target organ damage.  Her calculated 10 year risk of coronary heart disease is 9 %.  Today's blood pressure is 136/80.    Lipid Assessment/Plan:      Based on NCEP/ATP III, the patient's risk factor category is "2 or more risk factors and a calculated 10 year CAD risk of < 20%".  The patient's lipid goals are as follows: Total cholesterol goal is 200; LDL cholesterol goal is 130; HDL cholesterol goal is 40; Triglyceride goal is 150.    Patient Instructions: 1)  Please schedule a follow-up appointment in 3 months .  Prescriptions: MELOXICAM 15 MG TABS (MELOXICAM) one by mouth once daily  #  30 x 5   Entered and Authorized by:   Evelena Peat MD   Signed by:   Evelena Peat MD on 01/16/2010   Method used:   Electronically to        [facility]  [place] [address] (retail)       [address]       [place], [place]  [address]       Ph: [phone] or [phone]       Fax: [phone]   RxID:   [id number]    Orders Added: 1)  Est. Patient Level IV [59563]

## 2010-05-30 ENCOUNTER — Other Ambulatory Visit: Payer: Self-pay | Admitting: Family Medicine

## 2010-06-23 ENCOUNTER — Other Ambulatory Visit: Payer: Self-pay | Admitting: Family Medicine

## 2010-07-20 ENCOUNTER — Other Ambulatory Visit: Payer: Self-pay | Admitting: Family Medicine

## 2010-07-31 NOTE — Procedures (Signed)
[facility]  Patient:    [name] Frank, [name] Frank Visit Number: [id number] MRN: [id number]          Service Type: Attending:  Anselmo Rod, M.D. Dictated by:   Anselmo Rod, M.D. Proc. Date: 08/02/01   CC:         Kristian Covey, M.D.   Procedure Report  DATE OF BIRTH:  01/20/1935.  PROCEDURE:  Colonoscopy.  ENDOSCOPIST:  Anselmo Rod, M.D.  INSTRUMENT USED:  Adjustable pediatric Olympus colonoscope.  INDICATION FOR PROCEDURE:  Rectal bleeding and history of change in bowel habits in a 76 year old African-American female.  Rule out colonic polyps, masses, hemorrhoids, etc.  PREPROCEDURE PREPARATION:  Informed consent was procured from the patient. The patient was fasted for eight hours prior to the procedure and prepped with a bottle of magnesium citrate and a gallon of NuLytely the night prior to the procedure.  PREPROCEDURE PHYSICAL:  VITAL SIGNS:  The patient had stable vital signs.  NECK:  Supple.  CHEST:  Clear to auscultation.  S1, S2 regular.  ABDOMEN:  Soft with normal bowel sounds.  DESCRIPTION OF PROCEDURE:  The patient was placed in the left lateral decubitus position and sedated with 75 mg of Demerol and 8 mg of Versed intravenously.  Once the patient was adequately sedate and maintained on low-flow oxygen and continuous cardiac monitoring, the Olympus video colonoscope was advanced from the rectum to the cecum with difficulty.  The patient had a very tortuous colon.  Multiple attempts were made to reposition the patient from the left lateral to the supine and the right lateral position to adequately visualize the entire colonic mucosa.  The procedure was complete up to the cecum.  The appendiceal orifice and the ileocecal valve were clearly visualized and photographed.  No masses or polyps were seen.  The patient had somewhat lax sphincter tone, and retroflexion was not possible.  There was  no evidence of diverticular disease or hemorrhoids.  The patient tolerated the procedure well without complication.  IMPRESSION: 1. Very tortuous colon, no masses or polyps seen. 2. Lax sphincter tone, retroflexion not possible.  RECOMMENDATIONS: 1. A high-fiber diet has been discussed with the patient in great detail, and    brochures have been given to her for her education. 2. Outpatient follow-up is advised in the next two weeks.  Further    recommendations will be made depending on her symptoms in the near future.Dictated by:   Anselmo Rod, M.D. Attending:  Anselmo Rod, M.D. DD:  08/02/01 TD:  08/04/01 Job: [id number]

## 2010-09-02 ENCOUNTER — Other Ambulatory Visit: Payer: Self-pay | Admitting: Family Medicine

## 2010-09-17 ENCOUNTER — Encounter: Payer: Self-pay | Admitting: Family Medicine

## 2010-09-23 ENCOUNTER — Ambulatory Visit (INDEPENDENT_AMBULATORY_CARE_PROVIDER_SITE_OTHER): Payer: Medicare Other | Admitting: Family Medicine

## 2010-09-23 ENCOUNTER — Encounter: Payer: Self-pay | Admitting: Family Medicine

## 2010-09-23 VITALS — BP 144/80 | Temp 98.1°F | Wt 162.0 lb

## 2010-09-23 DIAGNOSIS — M545 Low back pain: Secondary | ICD-10-CM

## 2010-09-23 DIAGNOSIS — Z780 Asymptomatic menopausal state: Secondary | ICD-10-CM

## 2010-09-23 DIAGNOSIS — M1990 Unspecified osteoarthritis, unspecified site: Secondary | ICD-10-CM

## 2010-09-23 DIAGNOSIS — I10 Essential (primary) hypertension: Secondary | ICD-10-CM

## 2010-09-23 NOTE — Progress Notes (Signed)
  Subjective:    Patient ID: [name] Frank, female    DOB: 11/04/1934, 76 y.o.   MRN: [id number]  HPI Patient has severe arthritis especially involving low back. Previous epidurals without much improvement. She has had previous intolerance to tramadol. Takes oxycodone infrequently. Mobic with minimal benefit. No relief withTylenol. Back pain overall is stable  Hypertension treated with 4 drug regimen. No recent headaches or dizziness. She has some chronic mild ankle edema unchanged. Recent lab work including basic metabolic panel normal. No history of diabetes. No renal insufficiency.  Wellness issues discussed. Postmenopausal on estrogen replacement for years.  Gyn retiring. She has never tried discontinuing this. Gets yearly mammograms.   Review of Systems  Constitutional: Negative for fever, activity change, appetite change and unexpected weight change.  Respiratory: Negative for cough and shortness of breath.   Cardiovascular: Negative for chest pain, palpitations and leg swelling.  Genitourinary: Negative for dysuria.  Musculoskeletal: Positive for myalgias, back pain and arthralgias.       Objective:   Physical Exam  Constitutional: She appears well-developed and well-nourished.  HENT:  Right Ear: External ear normal.  Left Ear: External ear normal.  Neck: Neck supple.  Cardiovascular: Normal rate, regular rhythm and normal heart sounds.   Pulmonary/Chest: Effort normal and breath sounds normal. No respiratory distress. She has no wheezes. She has no rales.  Musculoskeletal: She exhibits no edema.  Lymphadenopathy:    She has no cervical adenopathy.          Assessment & Plan:  #1 post menopausal. Discussed tapering off Estrace. #2 chronic low back pain with known osteoarthritis. Discussed role exercise. Continued infrequent oxycodone and meloxicam #3 hypertension marginal control- continue close monitoring.

## 2010-09-23 NOTE — Patient Instructions (Signed)
Consider tapering Estrace to one every other day for 2 weeks and then try to discontinue.

## 2010-10-06 ENCOUNTER — Other Ambulatory Visit: Payer: Self-pay | Admitting: Family Medicine

## 2010-10-19 ENCOUNTER — Other Ambulatory Visit: Payer: Self-pay | Admitting: Family Medicine

## 2010-11-06 ENCOUNTER — Other Ambulatory Visit: Payer: Self-pay | Admitting: Family Medicine

## 2010-11-26 ENCOUNTER — Encounter: Payer: Self-pay | Admitting: Family Medicine

## 2010-12-01 ENCOUNTER — Telehealth: Payer: Self-pay | Admitting: *Deleted

## 2010-12-01 NOTE — Telephone Encounter (Signed)
Appt made with Dr. Caryl Never tomorrow.

## 2010-12-02 ENCOUNTER — Ambulatory Visit (INDEPENDENT_AMBULATORY_CARE_PROVIDER_SITE_OTHER): Payer: Medicare Other | Admitting: Family Medicine

## 2010-12-02 ENCOUNTER — Encounter: Payer: Self-pay | Admitting: Family Medicine

## 2010-12-02 VITALS — BP 142/82 | Temp 98.0°F | Wt 156.0 lb

## 2010-12-02 DIAGNOSIS — T23002A Burn of unspecified degree of left hand, unspecified site, initial encounter: Secondary | ICD-10-CM

## 2010-12-02 DIAGNOSIS — T23009A Burn of unspecified degree of unspecified hand, unspecified site, initial encounter: Secondary | ICD-10-CM

## 2010-12-02 DIAGNOSIS — Z23 Encounter for immunization: Secondary | ICD-10-CM

## 2010-12-02 NOTE — Progress Notes (Signed)
  Subjective:    Patient ID: [name] Frank, female    DOB: Mar 24, 1934, 76 y.o.   MRN: [id number]  HPI Burn left hand dorsally with recurrence 5 days ago. Occurred while stirring gravy. Initial pain but no significant pain at this time. No progressive erythema. No drainage. Last tetanus 2010. Patient denies any other injury. Full range of motion all digits of the hand. She has not been using any topical antibiotic or doing any dressings   Review of Systems  Constitutional: Negative for fever and chills.  Hematological: Negative for adenopathy.       Objective:   Physical Exam  Constitutional: She appears well-developed and well-nourished. No distress.  Cardiovascular: Normal rate and regular rhythm.   Pulmonary/Chest: Effort normal and breath sounds normal. No respiratory distress. She has no wheezes. She has no rales.  Skin:       Dorsum left hand proximal to thumb patient has second degree burn. No surrounding cellulitis. No drainage. Dimensions of burn are 4 x 4 centimeters          Assessment & Plan:  Second degree burn left hand. No secondary infection. Wound care discussed. Tetanus up to date. Daily dressing changes. Reassess 5 days

## 2010-12-02 NOTE — Patient Instructions (Signed)
Burn Care Instructions Your skin is a natural barrier to infection. It is the largest organ of your body. Because of your burn, this natural protection has been damaged. To help prevent infection, it is very important to follow these instructions in the care of your burn. BURNS ARE CLASSIFIED AS:  First degree - only erythema or redness of skin. No scarring expected.   Second degree - blistering of skin. No scarring expected.   Third degree - destruction of all layers of skin, scarring expected. Depending on size it may require grafting.  HOME CARE INSTRUCTIONS  Wash hands well before changing your bandage.   Change your bandage one times per day or as instructed by your caregiver.   Remove old bandage. (If bandage sticks to burn you may soak it off with cool, clean water).   Cleanse thoroughly but gently with mild soap and water.   Pat dry with a clean, dry cloth.   Apply a thin layer of anti-bacterial (germ) cream to the burn.   Apply clean bandages as shown to you in the Emergency Department or by your caregiver.   Keep dressing as clean and dry as possible.   Elevate affected area (such as hand or foot) for the first 24 hours, then as instructed by caregiver.   Only take over-the-counter or prescription medicines for pain, discomfort, or fever as directed by your caregiver.  SEEK IMMEDIATE MEDICAL CARE IF:  You develop excessive pain.   The burned area develops redness, tenderness, swelling, or red streaks near the burn.   The burned area develops pus or a foul odor.   You develop an oral temperature above 100.  MAKE SURE YOU:   Understand these instructions.   Will watch your condition.   Will get help right away if you are not doing well or get worse.  Document Released: 03/01/2005 Document Re-Released: 08/19/2009 [facility] Patient Information 2011 [place], [place].

## 2010-12-07 ENCOUNTER — Encounter: Payer: Self-pay | Admitting: Family Medicine

## 2010-12-07 ENCOUNTER — Ambulatory Visit (INDEPENDENT_AMBULATORY_CARE_PROVIDER_SITE_OTHER): Payer: Medicare Other | Admitting: Family Medicine

## 2010-12-07 VITALS — BP 140/80 | Temp 98.2°F | Wt 159.0 lb

## 2010-12-07 DIAGNOSIS — I10 Essential (primary) hypertension: Secondary | ICD-10-CM

## 2010-12-07 DIAGNOSIS — T23209A Burn of second degree of unspecified hand, unspecified site, initial encounter: Secondary | ICD-10-CM

## 2010-12-07 DIAGNOSIS — T23202A Burn of second degree of left hand, unspecified site, initial encounter: Secondary | ICD-10-CM

## 2010-12-07 NOTE — Progress Notes (Signed)
  Subjective:    Patient ID: [name] Frank, female    DOB: 1934/11/22, 76 y.o.   MRN: [id number]  HPI Followup burn left hand. Essentially no pain. Daily dressing changes with Silvadene. Overall feeling very well. No complaints.  Hypertension treated with Norvasc, hydrochlorothiazide, metoprolol, and losartan. Compliant with all medications. No dizziness. Denies any chest pains or dyspnea.   Review of Systems  Constitutional: Negative for fever and chills.  Cardiovascular: Negative for chest pain.  Neurological: Negative for dizziness and headaches.       Objective:   Physical Exam  Constitutional: She appears well-developed and well-nourished.  Neck: Neck supple. No thyromegaly present.  Cardiovascular: Normal rate and regular rhythm.   Pulmonary/Chest: Effort normal and breath sounds normal. No respiratory distress. She has no wheezes. She has no rales.  Musculoskeletal: She exhibits no edema.  Lymphadenopathy:    She has no cervical adenopathy.  Skin:       Second degree burn left hand healing extremely well. Much less erythema. No cellulitis changes.          Assessment & Plan:  #1 second degree burn left hand healing well with no signs of infection. Continue basic wound care and followup as needed #2 hypertension stable. Continue current medications

## 2010-12-07 NOTE — Patient Instructions (Signed)
Continue with Silvadene dressing for another couple of days and then should be able to leave open.

## 2011-02-18 ENCOUNTER — Telehealth: Payer: Self-pay | Admitting: *Deleted

## 2011-02-18 NOTE — Telephone Encounter (Signed)
Not taking any medications associated with cough.  If cough has gone one full month needs to be seen but not urgently if no fever or other acute symptoms.  See if we can get in next week

## 2011-02-18 NOTE — Telephone Encounter (Signed)
Pt is having a runny nose and cough. Cough has been going on for 1 month or more. No fever or sob. Pt hasn't tried anything OTC. She is wanting to know if it could be her medication causing the cough and what she could do about it. Eyes are watery. No fever.

## 2011-02-19 NOTE — Telephone Encounter (Signed)
Pt aware and appt made.

## 2011-02-21 ENCOUNTER — Other Ambulatory Visit: Payer: Self-pay | Admitting: Family Medicine

## 2011-02-25 ENCOUNTER — Ambulatory Visit (INDEPENDENT_AMBULATORY_CARE_PROVIDER_SITE_OTHER): Payer: Medicare Other | Admitting: Family Medicine

## 2011-02-25 ENCOUNTER — Encounter: Payer: Self-pay | Admitting: Family Medicine

## 2011-02-25 VITALS — BP 122/82 | Temp 98.3°F | Wt 155.0 lb

## 2011-02-25 DIAGNOSIS — R350 Frequency of micturition: Secondary | ICD-10-CM

## 2011-02-25 DIAGNOSIS — I10 Essential (primary) hypertension: Secondary | ICD-10-CM

## 2011-02-25 DIAGNOSIS — R05 Cough: Secondary | ICD-10-CM

## 2011-02-25 LAB — POCT URINALYSIS DIPSTICK
Ketones, UA: NEGATIVE
Protein, UA: NEGATIVE
Spec Grav, UA: 1.025
pH, UA: 5

## 2011-02-25 MED ORDER — AZELASTINE HCL 0.1 % NA SOLN: 1.0000 | Freq: Two times a day (BID) | NASAL | Status: DC

## 2011-02-25 NOTE — Progress Notes (Signed)
  Subjective:    Patient ID: [name] Frank, female    DOB: [birth year], 76 y.o.   MRN: [id number]  HPI  Chronic cough. Duration several months. Patient has frequent postnasal drip symptoms of clear mucus. No facial pain. Denies any fever or chills. No GERD symptoms. No wheezing. Denies any pleuritic pain or hemoptysis. Quit smoking 1985. No recent appetite or weight changes. Takes Flonase regularly. No consistent use of oral antihistamines.  No exacerbating features other than ? Postnasal drip symptoms.  No alleviating factors.  Past Medical History  Diagnosis Date  . Arthritis   . Allergy   . Hyperlipidemia   . Hypertension   . Anxiety    Past Surgical History  Procedure Date  . Appendectomy   . Cholecystectomy   . Abdominal hysterectomy   . Breast surgery     biopsy  . Back surgery     reports that she quit smoking about 27 years ago. Her smoking use included Cigarettes. She has a 5 pack-year smoking history. She does not have any smokeless tobacco history on file. She reports that she does not drink alcohol. Her drug history not on file. family history includes Alcohol abuse in an unspecified family member; Arthritis in an unspecified family member; Cancer in an unspecified family member; Depression in an unspecified family member; Diabetes in her mother; Heart disease in an unspecified family member; Hypertension in an unspecified family member; and Stroke in an unspecified family member. Allergies  Allergen Reactions  . Codeine Sulfate     REACTION: stomach pains  . Tramadol Hcl     REACTION: stomach cramps      Review of Systems  Constitutional: Negative for fever, chills, activity change and unexpected weight change.  HENT: Negative for sore throat.   Respiratory: Positive for cough. Negative for shortness of breath and wheezing.   Cardiovascular: Negative for chest pain, palpitations and leg swelling.  Gastrointestinal: Negative for abdominal pain.    Neurological: Negative for dizziness, syncope and headaches.  Hematological: Negative for adenopathy.       Objective:   Physical Exam  Constitutional: She is oriented to person, place, and time. She appears well-developed and well-nourished. No distress.  HENT:  Right Ear: External ear normal.  Left Ear: External ear normal.  Mouth/Throat: Oropharynx is clear and moist.  Neck: Neck supple. No thyromegaly present.  Cardiovascular: Normal rate and regular rhythm.   Pulmonary/Chest: Effort normal and breath sounds normal. No respiratory distress. She has no wheezes. She has no rales.  Musculoskeletal: She exhibits no edema.  Lymphadenopathy:    She has no cervical adenopathy.  Neurological: She is alert and oriented to person, place, and time.          Assessment & Plan:  Chronic cough. Suspect related to allergic postnasal drip but given duration chest x-ray. Add Astelin nasal twice daily and continue Flonase. Consider over-the-counter Allegra or Zyrtec. Touch base 2 weeks if symptoms not improving.  No red flags such as weight loss, hemoptysis, etc.

## 2011-02-25 NOTE — Patient Instructions (Signed)
Try over the counter antihistamine such as Allegra or Zyrtec. Add Astelin nasal to your Flonase.

## 2011-03-01 ENCOUNTER — Ambulatory Visit (INDEPENDENT_AMBULATORY_CARE_PROVIDER_SITE_OTHER)
Admission: RE | Admit: 2011-03-01 | Discharge: 2011-03-01 | Disposition: A | Discharge status: Home / Self Care | Payer: Medicare Other | Source: Ambulatory Visit | Attending: Family Medicine | Admitting: Family Medicine

## 2011-03-01 DIAGNOSIS — R05 Cough: Secondary | ICD-10-CM

## 2011-03-02 NOTE — Progress Notes (Signed)
Quick Note:  Pt informed on VM ______ 

## 2011-03-03 NOTE — Telephone Encounter (Addendum)
Pt call not afford generic astelin ns cost 45.00. Please call something cheaper [facility]. Pt has [employer].

## 2011-03-03 NOTE — Telephone Encounter (Signed)
No equivalent nasal spray.  I would have her try OTC Allegra one daily.

## 2011-03-04 NOTE — Telephone Encounter (Signed)
Pt informed

## 2011-03-22 ENCOUNTER — Encounter: Payer: Self-pay | Admitting: Family Medicine

## 2011-03-22 ENCOUNTER — Ambulatory Visit (INDEPENDENT_AMBULATORY_CARE_PROVIDER_SITE_OTHER): Payer: Medicare Other | Admitting: Family Medicine

## 2011-03-22 DIAGNOSIS — R05 Cough: Secondary | ICD-10-CM

## 2011-03-22 DIAGNOSIS — I10 Essential (primary) hypertension: Secondary | ICD-10-CM

## 2011-03-22 DIAGNOSIS — E785 Hyperlipidemia, unspecified: Secondary | ICD-10-CM

## 2011-03-22 DIAGNOSIS — M1990 Unspecified osteoarthritis, unspecified site: Secondary | ICD-10-CM

## 2011-03-22 LAB — LIPID PANEL
Cholesterol: 145 mg/dL (ref 0–200)
HDL: 44.6 mg/dL (ref >39.00)
LDL Cholesterol: 74 mg/dL (ref 0–99)
VLDL: 26.6 mg/dL (ref 0.0–40.0)

## 2011-03-22 LAB — HEPATIC FUNCTION PANEL
Bilirubin, Direct: 0.1 mg/dL (ref 0.0–0.3)
Total Bilirubin: 0.7 mg/dL (ref 0.3–1.2)

## 2011-03-22 LAB — BASIC METABOLIC PANEL
BUN: 18 mg/dL (ref 6–23)
CO2: 31 mEq/L (ref 19–32)
Glucose, Bld: 100 mg/dL — ABNORMAL HIGH (ref 70–99)
Potassium: 3.5 mEq/L (ref 3.5–5.1)
Sodium: 145 mEq/L (ref 135–145)

## 2011-03-22 MED ORDER — HYDROCODONE-HOMATROPINE 5-1.5 MG/5ML PO SYRP
5.0000 mL | ORAL_SOLUTION | Freq: Four times a day (QID) | ORAL | Status: AC | PRN
Start: 2011-03-22 — End: 2011-04-01

## 2011-03-22 NOTE — Progress Notes (Signed)
  Subjective:    Patient ID: [name] Frank, female    DOB: 01-23-1935, 77 y.o.   MRN: [id number]  HPI  Medical followup and followup from recent illness. Onset Christmas Eve of cough nasal congestion body aches. Poor appetite. Overall improved and now has persistent dry cough. Especially bothersome at night. Not relieved with over-the-counter medications. Postnasal drip symptoms treated with Allegra.  She has chronic problems including hyperlipidemia, hypertension, urge urinary incontinence and osteoarthritis. Needs repeat lab work. Medications reviewed. Compliant with all. Denies recent chest pains. Hyperlipidemia treated with simvastatin. No myalgias.   Review of Systems  Constitutional: Negative for fever, chills, activity change, appetite change and fatigue.  HENT: Negative for congestion, sore throat and postnasal drip.   Respiratory: Positive for cough. Negative for shortness of breath and wheezing.   Cardiovascular: Negative for chest pain.  Gastrointestinal: Negative for abdominal pain.  Neurological: Negative for dizziness and headaches.       Objective:   Physical Exam  Constitutional: She appears well-developed and well-nourished.  HENT:  Right Ear: External ear normal.  Left Ear: External ear normal.  Mouth/Throat: Oropharynx is clear and moist.  Neck: Neck supple. No thyromegaly present.  Cardiovascular: Normal rate and regular rhythm.   Pulmonary/Chest: Effort normal and breath sounds normal. No respiratory distress. She has no wheezes. She has no rales.  Musculoskeletal: She exhibits no edema.  Lymphadenopathy:    She has no cervical adenopathy.          Assessment & Plan:  #1 cough probably secondary to recent acute viral illness. Hycodan as needed for cough suppression at night. Follow up probably for a fever #2 hyperlipidemia. Check lipid and hepatic panel. Continue current medication  #3 hypertension stable and at goal. #4 osteoarthritis stable.  Continue meloxicam

## 2011-03-23 NOTE — Progress Notes (Signed)
Quick Note:  Pt informed on home VM ______ 

## 2011-04-23 ENCOUNTER — Other Ambulatory Visit: Payer: Self-pay

## 2011-04-23 MED ORDER — HYDROCHLOROTHIAZIDE 25 MG PO TABS: 25.0000 mg | ORAL_TABLET | Freq: Every day | ORAL | Status: DC

## 2011-04-23 NOTE — Telephone Encounter (Signed)
Fax refill request from [facility] for hydromet  Last seen 03/22/11 - last written 03/22/11   0RF Please advise

## 2011-04-23 NOTE — Telephone Encounter (Signed)
Refill once 

## 2011-04-23 NOTE — Telephone Encounter (Signed)
rx called in

## 2011-04-26 ENCOUNTER — Telehealth: Payer: Self-pay | Admitting: *Deleted

## 2011-04-26 NOTE — Telephone Encounter (Signed)
Refill once 

## 2011-04-26 NOTE — Telephone Encounter (Signed)
Duplicate request

## 2011-04-26 NOTE — Telephone Encounter (Signed)
Faxed request for refill of Homatropine cough syrup.  Last filled 03-22-11

## 2011-07-20 ENCOUNTER — Other Ambulatory Visit: Payer: Self-pay | Admitting: *Deleted

## 2011-07-20 MED ORDER — SIMVASTATIN 20 MG PO TABS: 20.0000 mg | ORAL_TABLET | Freq: Every day | ORAL | Status: DC

## 2011-07-21 ENCOUNTER — Other Ambulatory Visit: Payer: Self-pay | Admitting: *Deleted

## 2011-07-21 MED ORDER — SIMVASTATIN 20 MG PO TABS: 20.0000 mg | ORAL_TABLET | Freq: Every day | ORAL | Status: DC

## 2011-07-28 ENCOUNTER — Encounter: Payer: Self-pay | Admitting: Family Medicine

## 2011-07-28 ENCOUNTER — Ambulatory Visit (INDEPENDENT_AMBULATORY_CARE_PROVIDER_SITE_OTHER): Payer: Medicare Other | Admitting: Family Medicine

## 2011-07-28 VITALS — BP 142/82 | Temp 97.8°F | Wt 154.0 lb

## 2011-07-28 DIAGNOSIS — M1990 Unspecified osteoarthritis, unspecified site: Secondary | ICD-10-CM

## 2011-07-28 DIAGNOSIS — M48061 Spinal stenosis, lumbar region without neurogenic claudication: Secondary | ICD-10-CM

## 2011-07-28 DIAGNOSIS — M169 Osteoarthritis of hip, unspecified: Secondary | ICD-10-CM

## 2011-07-28 NOTE — Patient Instructions (Signed)
We will call you regarding orthopedic referral. 

## 2011-07-28 NOTE — Progress Notes (Signed)
Subjective:    Patient ID: [name] Frank, female    DOB: [birth year], 77 y.o.   MRN: [id number]  HPI  Progressive right hip pain. She has known osteoarthritis. Previous x-rays 2010 reveals severe degenerative change right hip and also advanced arthritis left hip. Her pain is mostly right sided. Especially progressive over the past several months. Pain is sometimes 9 out 10 severity. She also has frequent right lumbar back pain which is sharp with occasional radiation down right lower extremity. Her hip pain and back pain are worse with walking and movement. She has occasional numbness right upper thigh. Question of some progressive weakness. Taken meloxicam without relief. Supplements with rare Percocet which does help. She has been reluctant to surgery previously but is willing to consider this point.  She has known degenerative spondylosis lumbar spine by previous MRI scan 10/03/2008.  Evidence then for right S1 nerve impingement.  Past Medical History  Diagnosis Date  . Arthritis   . Allergy   . Hyperlipidemia   . Hypertension   . Anxiety    Past Surgical History  Procedure Date  . Appendectomy   . Cholecystectomy   . Abdominal hysterectomy   . Breast surgery     biopsy  . Back surgery     reports that she quit smoking about 27 years ago. Her smoking use included Cigarettes. She has a 5 pack-year smoking history. She does not have any smokeless tobacco history on file. She reports that she does not drink alcohol. Her drug history not on file. family history includes Alcohol abuse in an unspecified family member; Arthritis in an unspecified family member; Cancer in an unspecified family member; Depression in an unspecified family member; Diabetes in her mother; Heart disease in an unspecified family member; Hypertension in an unspecified family member; and Stroke in an unspecified family member. Allergies  Allergen Reactions  . Codeine Sulfate     REACTION: stomach pains    . Tramadol Hcl     REACTION: stomach cramps      Review of Systems  Constitutional: Negative for appetite change and unexpected weight change.  Respiratory: Negative for cough and shortness of breath.   Cardiovascular: Negative for chest pain.  Gastrointestinal: Negative for abdominal pain.  Genitourinary: Negative for dysuria.  Musculoskeletal: Positive for back pain, arthralgias and gait problem. Negative for myalgias.       Objective:   Physical Exam  Constitutional: She appears well-developed and well-nourished.  Cardiovascular: Normal rate and regular rhythm.   Pulmonary/Chest: Effort normal and breath sounds normal. No respiratory distress. She has no wheezes. She has no rales.  Musculoskeletal: She exhibits no edema.       Very limited range of motion right hip. Pain with internal and external rotation. Straight leg raise is negative.  Neurological:       Trace reflexes in knee and ankle bilaterally. Question of some weakness with right plantar flexion dorsiflexion compared to the left. Subjective impairment of sensation right anterior thigh compared to left.          Assessment & Plan:  Right hip pain. She has known severe degenerative arthritis. Needs orthopedic referral. We elected not to get x-rays since she has known severe degenerative changes and orthopedist will likely need to repeat any way. She had severe right-sided osteoarthritis 2010. Suspect will need consideration for total hip replacement.  Ambulation becoming significantly impaired.  Known degenerative spondylosis lumbar spine. Suspect some of her radiculopathy symptoms related to this. Suspect her ambulation  difficulties are more related to her severe osteoarthritis hip

## 2011-08-10 ENCOUNTER — Telehealth: Payer: Self-pay | Admitting: *Deleted

## 2011-08-10 NOTE — Telephone Encounter (Signed)
Pt states she wants to speak to dr burchette about her condition,and then states she wants to talk to someone about a bill from 2006 that she is just getting from dr Charlann Boxer and bean??Thinks she needs to  talk to dr burchette because dr Caryl Never sent him to these mds??

## 2011-08-11 ENCOUNTER — Other Ambulatory Visit: Payer: Self-pay | Admitting: *Deleted

## 2011-08-11 MED ORDER — LOSARTAN POTASSIUM 100 MG PO TABS: 100.0000 mg | ORAL_TABLET | Freq: Every day | ORAL | Status: DC

## 2011-08-12 NOTE — Telephone Encounter (Signed)
Returned called to patient and she stated that her issues have been resolved.

## 2011-09-30 ENCOUNTER — Encounter: Payer: Self-pay | Admitting: Family Medicine

## 2011-10-05 ENCOUNTER — Telehealth: Payer: Self-pay | Admitting: *Deleted

## 2011-10-05 MED ORDER — HYDROCODONE-HOMATROPINE 5-1.5 MG/5ML PO SYRP: 5.0000 mL | ORAL_SOLUTION | Freq: Three times a day (TID) | ORAL | Status: AC | PRN

## 2011-10-05 NOTE — Telephone Encounter (Signed)
Refill once 

## 2011-10-05 NOTE — Telephone Encounter (Signed)
Pt requesting refill Hydrocodone-Homatropine syrup.  Last filled 04/01/11

## 2011-10-11 ENCOUNTER — Ambulatory Visit (INDEPENDENT_AMBULATORY_CARE_PROVIDER_SITE_OTHER): Payer: Medicare Other | Admitting: Family Medicine

## 2011-10-11 ENCOUNTER — Encounter: Payer: Self-pay | Admitting: Family Medicine

## 2011-10-11 VITALS — BP 138/78 | Temp 97.7°F | Wt 149.0 lb

## 2011-10-11 DIAGNOSIS — R300 Dysuria: Secondary | ICD-10-CM

## 2011-10-11 DIAGNOSIS — M160 Bilateral primary osteoarthritis of hip: Secondary | ICD-10-CM

## 2011-10-11 DIAGNOSIS — M169 Osteoarthritis of hip, unspecified: Secondary | ICD-10-CM

## 2011-10-11 DIAGNOSIS — K219 Gastro-esophageal reflux disease without esophagitis: Secondary | ICD-10-CM

## 2011-10-11 LAB — POCT URINALYSIS DIPSTICK
Bilirubin, UA: NEGATIVE
Glucose, UA: NEGATIVE
Ketones, UA: NEGATIVE
Nitrite, UA: NEGATIVE

## 2011-10-11 MED ORDER — PANTOPRAZOLE SODIUM 40 MG PO TBEC: 40.0000 mg | DELAYED_RELEASE_TABLET | Freq: Every day | ORAL | Status: DC

## 2011-10-11 NOTE — Progress Notes (Signed)
  Subjective:    Patient ID: [name] Frank, female    DOB: 03-14-1935, 77 y.o.   MRN: [id number]  HPI  Patient presents with multiple symptoms. She frequently has dry cough and frequently clears her throat. She has occasional sensation of something being stuck in her throat but has never had any true dysphagia or odynophagia. No hoarseness. Chest x-ray several months ago which was normal. No appetite or weight changes. Nonsmoker. No hemoptysis. No pleuritic pain. She has long history of frequent sinus congestive symptoms. No purulent secretions.  Patient also has some mild dysuria off and on.  Mild burning and occasional urgency.  No fever, nausea, back pain.    Saw orthopedist since last visit and ?steroid injection right hip.  Minimal improvement.    Past Medical History  Diagnosis Date  . Arthritis   . Allergy   . Hyperlipidemia   . Hypertension   . Anxiety    Past Surgical History  Procedure Date  . Appendectomy   . Cholecystectomy   . Abdominal hysterectomy   . Breast surgery     biopsy  . Back surgery     reports that she quit smoking about 28 years ago. Her smoking use included Cigarettes. She has a 5 pack-year smoking history. She does not have any smokeless tobacco history on file. She reports that she does not drink alcohol. Her drug history not on file. family history includes Alcohol abuse in an unspecified family member; Arthritis in an unspecified family member; Cancer in an unspecified family member; Depression in an unspecified family member; Diabetes in her mother; Heart disease in an unspecified family member; Hypertension in an unspecified family member; and Stroke in an unspecified family member. Allergies  Allergen Reactions  . Codeine Sulfate     REACTION: stomach pains  . Tramadol Hcl     REACTION: stomach cramps      Review of Systems  Constitutional: Negative for fever, appetite change and unexpected weight change.  HENT: Positive for  congestion. Negative for sore throat, trouble swallowing and voice change.   Respiratory: Positive for cough. Negative for shortness of breath and wheezing.   Cardiovascular: Negative for chest pain, palpitations and leg swelling.  Genitourinary: Positive for dysuria, urgency and frequency. Negative for hematuria, flank pain and difficulty urinating.  Musculoskeletal: Positive for arthralgias.  Neurological: Negative for dizziness.  Hematological: Negative for adenopathy.       Objective:   Physical Exam  Constitutional: She appears well-developed and well-nourished. No distress.  HENT:  Mouth/Throat: Oropharynx is clear and moist.  Neck: Neck supple. No thyromegaly present.  Cardiovascular: Normal rate and regular rhythm.   Pulmonary/Chest: Effort normal and breath sounds normal. No respiratory distress. She has no wheezes. She has no rales.  Lymphadenopathy:    She has no cervical adenopathy.          Assessment & Plan:  Patient presents with multiple issues including intermittent dry cough, frequent sinus congestion, and frequent clearing of throat. Question GERD. Discontinue Mobic which she states is not helping her arthritis. Start pantoprazole 40 mg once daily. Reassess in one month. Dysuria.  Urine dip unremarkable.  ?atropic vaginitis. Osteoarthritis hip.  Recently saw orthopedist with ?steroid injection of hip.  Minimal improvement and she will follow up with them if pain persists.

## 2011-11-17 ENCOUNTER — Other Ambulatory Visit: Payer: Self-pay | Admitting: *Deleted

## 2011-11-17 MED ORDER — MELOXICAM 15 MG PO TABS: 15.0000 mg | ORAL_TABLET | Freq: Every day | ORAL | Status: DC | PRN

## 2011-11-24 LAB — HM MAMMOGRAPHY: HM Mammogram: NEGATIVE

## 2011-11-25 ENCOUNTER — Other Ambulatory Visit: Payer: Self-pay | Admitting: *Deleted

## 2011-11-25 MED ORDER — AMLODIPINE BESYLATE 5 MG PO TABS: 5.0000 mg | ORAL_TABLET | Freq: Every day | ORAL | Status: DC

## 2011-11-26 ENCOUNTER — Encounter: Payer: Self-pay | Admitting: Family Medicine

## 2011-11-29 ENCOUNTER — Telehealth: Payer: Self-pay | Admitting: Family Medicine

## 2011-11-29 NOTE — Telephone Encounter (Signed)
Patient called stating that she need a refill of her oxycodone. Please assist.  °

## 2011-11-29 NOTE — Telephone Encounter (Signed)
Pt last seen 10/11/11.  Pls advise.  

## 2011-11-29 NOTE — Telephone Encounter (Signed)
OK to refill #30.  Confirm whether she has seen orthopedist yet.

## 2011-11-30 ENCOUNTER — Encounter: Payer: Self-pay | Admitting: Family Medicine

## 2011-11-30 ENCOUNTER — Ambulatory Visit (INDEPENDENT_AMBULATORY_CARE_PROVIDER_SITE_OTHER): Payer: Medicare Other | Admitting: Family Medicine

## 2011-11-30 VITALS — BP 142/88 | Temp 97.9°F | Wt 148.0 lb

## 2011-11-30 DIAGNOSIS — M1990 Unspecified osteoarthritis, unspecified site: Secondary | ICD-10-CM

## 2011-11-30 DIAGNOSIS — J309 Allergic rhinitis, unspecified: Secondary | ICD-10-CM

## 2011-11-30 DIAGNOSIS — M169 Osteoarthritis of hip, unspecified: Secondary | ICD-10-CM

## 2011-11-30 DIAGNOSIS — R21 Rash and other nonspecific skin eruption: Secondary | ICD-10-CM

## 2011-11-30 MED ORDER — OXYCODONE-ACETAMINOPHEN 10-325 MG PO TABS: 1.0000 | ORAL_TABLET | ORAL | Status: DC | PRN

## 2011-11-30 MED ORDER — HYDROXYZINE HCL 25 MG PO TABS: 25.0000 mg | ORAL_TABLET | Freq: Three times a day (TID) | ORAL | Status: DC | PRN

## 2011-11-30 MED ORDER — AZELASTINE HCL 0.1 % NA SOLN: 1.0000 | Freq: Two times a day (BID) | NASAL | Status: DC

## 2011-11-30 NOTE — Progress Notes (Signed)
  Subjective:    Patient ID: [name] Frank, female    DOB: Dec 01, 1934, 77 y.o.   MRN: [id number]  HPI  The patient seen for several issues as follows  Severe degenerative arthritis right hip. Recently saw orthopedics.  Had cortisone injection which did not seem to help much. She has not followup with him yet. She is requesting refill of Percocet. She has not had any pain medicine from orthopedist. She's tried meloxicam regular without relief. Tylenol without relief. Severe difficulty with transfers increased night pain. No recent fall or injury.  She complains of difficulty swallowing the past several weeks. On further questioning no true dysphagia. No odynophagia. No appetite changes. Some gradual weight loss over the past several years- some of which as been due to her efforts. Her weight is only down 1 pound from July. She had several recent screening labs including TSH and CBC per gastroenterologist and these were normal. She's not having active GERD symptoms. Takes protonix. No appetite changes. Positive postnasal drip symptoms. Does use Flonase and over-the-counter antihistamine such as Allegra without much improvement. No purulent secretions. No fever or chills.  Pruritic rash lower legs around the ankles and feet. Present for about a week. She felt or some type of bite. No relief with topical creams   Review of Systems  Constitutional: Negative for fever, chills and appetite change.  HENT: Positive for postnasal drip. Negative for trouble swallowing and voice change.   Respiratory: Negative for cough and shortness of breath.   Cardiovascular: Negative for chest pain and palpitations.  Gastrointestinal: Negative for abdominal pain.  Genitourinary: Negative for dysuria.  Musculoskeletal: Positive for arthralgias.  Hematological: Negative for adenopathy. Does not bruise/bleed easily.  Psychiatric/Behavioral: Negative for dysphoric mood.       Objective:   Physical Exam    Constitutional: She appears well-developed and well-nourished.  HENT:  Mouth/Throat: Oropharynx is clear and moist.  Neck: Neck supple. No thyromegaly present.  Cardiovascular: Normal rate and regular rhythm.   Pulmonary/Chest: Effort normal and breath sounds normal. No respiratory distress. She has no wheezes. She has no rales.  Musculoskeletal: She exhibits no edema.  Lymphadenopathy:    She has no cervical adenopathy.  Skin: Rash noted.       Patient's couple small scattered erythematous papules lower legs. Couple small vesicles region. Nontender. No pustules. Papules are about 1-2 mm diameter  Psychiatric: She has a normal mood and affect. Her behavior is normal.          Assessment & Plan:  #1 severe osteoarthritis right hip. Refill Percocet. Explained she can only get through one provider. She is encouraged followup with orthopedist. No relief with corticosteroid injection recently. May need surgery soon  #2 allergic rhinitis. Try Astelin nasal 1 spray per nostril once daily.  #3 pruritic rash lower legs. Presumably secondary to some type of bite question flea versus chiggers. Atarax 25 mg each bedtime. Continue hydrocortisone cream

## 2011-11-30 NOTE — Telephone Encounter (Signed)
Left a message for pt to return call 

## 2011-12-02 NOTE — Telephone Encounter (Signed)
Pt informed Rx ready to pick up

## 2011-12-02 NOTE — Telephone Encounter (Signed)
Left a message for pt to return call 

## 2011-12-02 NOTE — Telephone Encounter (Signed)
Rx was printed on 11/30/11 and given to Dr. Caryl Never for signature.

## 2011-12-03 ENCOUNTER — Other Ambulatory Visit: Payer: Self-pay | Admitting: *Deleted

## 2011-12-03 MED ORDER — FLUTICASONE PROPIONATE 50 MCG/ACT NA SUSP: 2.0000 | Freq: Every day | NASAL | Status: DC

## 2011-12-20 ENCOUNTER — Encounter: Payer: Self-pay | Admitting: Family Medicine

## 2011-12-20 ENCOUNTER — Ambulatory Visit (INDEPENDENT_AMBULATORY_CARE_PROVIDER_SITE_OTHER): Payer: Medicare Other | Admitting: Family Medicine

## 2011-12-20 VITALS — BP 130/70 | Temp 97.8°F | Wt 144.0 lb

## 2011-12-20 DIAGNOSIS — I10 Essential (primary) hypertension: Secondary | ICD-10-CM

## 2011-12-20 DIAGNOSIS — Z23 Encounter for immunization: Secondary | ICD-10-CM

## 2011-12-20 DIAGNOSIS — M1990 Unspecified osteoarthritis, unspecified site: Secondary | ICD-10-CM

## 2011-12-20 DIAGNOSIS — Z01818 Encounter for other preprocedural examination: Secondary | ICD-10-CM

## 2011-12-20 NOTE — Progress Notes (Signed)
  Subjective:    Patient ID: [name] Frank, female    DOB: Jul 29, 1934, 77 y.o.   MRN: [id number]  HPI  Preop for total hip replacement. Patient's chronic medical problems include history of osteoarthritis, urinary incontinence, hypertension, hyperlipidemia. No history of diabetes. Former smoker. Quit 1985. No history of chronic lung disease. No cardiac history.  She needs flu vaccine. Has received prior Pneumovax. Denies any recent chest pains, syncope, or dyspnea. Her tetanus is up to date from 2010.  Past Medical History  Diagnosis Date  . Arthritis   . Allergy   . Hyperlipidemia   . Hypertension   . Anxiety    Past Surgical History  Procedure Date  . Appendectomy   . Cholecystectomy   . Abdominal hysterectomy   . Breast surgery     biopsy  . Back surgery     reports that she quit smoking about 28 years ago. Her smoking use included Cigarettes. She has a 5 pack-year smoking history. She does not have any smokeless tobacco history on file. She reports that she does not drink alcohol. Her drug history not on file. family history includes Alcohol abuse in an unspecified family member; Arthritis in an unspecified family member; Cancer in an unspecified family member; Depression in an unspecified family member; Diabetes in her mother; Heart disease in an unspecified family member; Hypertension in an unspecified family member; and Stroke in an unspecified family member. Allergies  Allergen Reactions  . Codeine Sulfate     REACTION: stomach pains  . Tramadol Hcl     REACTION: stomach cramps      Review of Systems  Constitutional: Negative for fatigue.  Eyes: Negative for visual disturbance.  Respiratory: Negative for cough, chest tightness, shortness of breath and wheezing.   Cardiovascular: Negative for chest pain, palpitations and leg swelling.  Genitourinary: Negative for dysuria.  Neurological: Negative for dizziness, seizures, syncope, weakness, light-headedness  and headaches.       Objective:   Physical Exam  Constitutional: She is oriented to person, place, and time. She appears well-developed and well-nourished.  Neck: Neck supple. No thyromegaly present.       No carotid bruits  Cardiovascular: Normal rate and regular rhythm.   Pulmonary/Chest: Effort normal and breath sounds normal. No respiratory distress. She has no wheezes. She has no rales.  Abdominal: Soft. Bowel sounds are normal. She exhibits no distension and no mass. There is no tenderness. There is no rebound and no guarding.  Musculoskeletal: She exhibits no edema.  Lymphadenopathy:    She has no cervical adenopathy.  Neurological: She is alert and oriented to person, place, and time.          Assessment & Plan:  Preoperative visit. Patient has no history of CAD or chronic lung disease. Hypertension has been well controlled. Check EKG since we have none on file. Had chest x-ray last December which was normal. No worrisome symptoms. Needs flu vaccine. No known contraindications for surgery this time

## 2011-12-23 ENCOUNTER — Other Ambulatory Visit: Payer: Self-pay | Admitting: Physician Assistant

## 2011-12-29 ENCOUNTER — Encounter (HOSPITAL_COMMUNITY): Payer: Self-pay

## 2011-12-29 ENCOUNTER — Encounter (HOSPITAL_COMMUNITY)
Admission: RE | Admit: 2011-12-29 | Discharge: 2011-12-29 | Disposition: A | Discharge status: Home / Self Care | Payer: Medicare Other | Source: Ambulatory Visit | Attending: Orthopedic Surgery | Admitting: Orthopedic Surgery

## 2011-12-29 HISTORY — DX: Frequency of micturition: R35.0

## 2011-12-29 HISTORY — DX: Other specified urinary incontinence: N39.498

## 2011-12-29 HISTORY — DX: Gastro-esophageal reflux disease without esophagitis: K21.9

## 2011-12-29 LAB — CBC WITH DIFFERENTIAL/PLATELET
Basophils Absolute: 0 10*3/uL (ref 0.0–0.1)
HCT: 41.8 % (ref 36.0–46.0)
Lymphocytes Relative: 28 % (ref 12–46)
Monocytes Absolute: 0.4 10*3/uL (ref 0.1–1.0)
Neutro Abs: 6 10*3/uL (ref 1.7–7.7)
Platelets: 236 10*3/uL (ref 150–400)
RBC: 5.05 MIL/uL (ref 3.87–5.11)
RDW: 13.9 % (ref 11.5–15.5)
WBC: 9 10*3/uL (ref 4.0–10.5)

## 2011-12-29 LAB — URINALYSIS, ROUTINE W REFLEX MICROSCOPIC
Ketones, ur: 15 mg/dL — AB
Leukocytes, UA: NEGATIVE
Nitrite: NEGATIVE
Specific Gravity, Urine: 1.017 (ref 1.005–1.030)
pH: 5.5 (ref 5.0–8.0)

## 2011-12-29 LAB — COMPREHENSIVE METABOLIC PANEL
ALT: 10 U/L (ref 0–35)
AST: 16 U/L (ref 0–37)
CO2: 30 mEq/L (ref 19–32)
Chloride: 102 mEq/L (ref 96–112)
GFR calc non Af Amer: 83 mL/min — ABNORMAL LOW (ref >90)
Sodium: 140 mEq/L (ref 135–145)
Total Bilirubin: 0.4 mg/dL (ref 0.3–1.2)

## 2011-12-29 LAB — ABO/RH: ABO/RH(D): O POS

## 2011-12-29 LAB — SURGICAL PCR SCREEN: MRSA, PCR: NEGATIVE

## 2011-12-29 LAB — APTT: aPTT: 31 seconds (ref 24–37)

## 2011-12-29 NOTE — Pre-Procedure Instructions (Signed)
20 SREYA FROIO  12/29/2011   Your procedure is scheduled on:  Friday, October 25  Report to [facility] at 0530 AM.  Call this number if you have problems the morning of surgery: [phone]   Remember:   Do not eat food or drink liquids:After Midnight.    Take these medicines the morning of surgery with A SIP OF WATER: *Amlodipine,nasal spray,Hydroxyzine,Metoprolol,pantoprazole, Oxycodone**   Do not wear jewelry, make-up or nail polish.  Do not wear lotions, powders, or perfumes. You may wear deodorant.  Do not shave 48 hours prior to surgery. Men may shave face and neck.  Do not bring valuables to the hospital.  Contacts, dentures or bridgework may not be worn into surgery.  Leave suitcase in the car. After surgery it may be brought to your room.  For patients admitted to the hospital, checkout time is 11:00 AM the day of discharge.   Special Instructions: Incentive Spirometry - Practice and bring it with you on the day of surgery. Shower using CHG 2 nights before surgery and the night before surgery.  If you shower the day of surgery use CHG.  Use special wash - you have one bottle of CHG for all showers.  You should use approximately 1/3 of the bottle for each shower.   Please read over the following fact sheets that you were given: Pain Booklet, Coughing and Deep Breathing, Blood Transfusion Information, MRSA Information and Surgical Site Infection Prevention

## 2011-12-30 LAB — URINE CULTURE

## 2011-12-30 NOTE — H&P (Signed)
[facility] [address]   [address] [place], [place] [address] [phone] A Division of [facility]  Daniel F. Murphy, M.D.   Robert A. [facility], M.D.   W. Dan Caffrey, M.D.   Ryin Schillo P. Landau, M.D.   Anna Voytek, M.D Pollock R. Ibazebo, M.D.  S. Michael Tooke, M.D.   James S. Kramer, M.D.   Rebecca S. Bassett, M.D. James M. Owens, PA-C            Kirstin A. Shepperson, PA-C Josh Edith Groleau, PA-C Brandon Parry, OPA-C  RE: [name] Frank, [name] Frank   [id number]      DOB: 02/17/1935 PROGRESS NOTE: 12-23-11 Follow-up right hip osteoarthritis.   HPI: The patient is a 77-year-old female with a history of right hip osteoarthritis. Also with a history of hypertension and hyperlipidemia. She presents today for further discussion of continued right hip pain and decrease in her range of motion. She has failed conservative treatment with p.o. NSAID, pain medication, intra-articular hip injection, as well as lumbosacral injections. The pain and limitation of her motion is significantly affecting her quality of life and activities of daily living. It will awaken her from sleep at night. She uses a single point cane for a gait aid. The patient is at her wits end and would like to further discuss surgical intervention at this time.  Review of Systems:   A 10-point review of systems obtained. Positive for glasses, high blood pressure, ankle swelling, frequent urination, night urination, weight loss. Past Medical History:   Positive for osteoarthritis right hip, hypertension, hyperlipidemia. Past Surgical History:   Gallbladder, hysterectomy, and a back surgery.  Current Medications:   Norvasc 5 mg daily, Astelin 137 mcg nasal spray b.i.d., calcium carbonate and vitamin D one tablet daily, fexofenadine 180 mg tablet daily, Flonase 50 mcg/Act nasal spray 2 sprays into each nose daily, hydrochlorothiazide 25 mg tablet daily, hydroxyzine 25 mg tablet t.i.d.  p.r.n. itching, losartan 100 mg tablet daily, meloxicam 15 mg tablet daily and as needed, metoprolol 50 mg 24 hour tablet daily, Percocet 10/325 q. 4 hours p.r.n. pain, Protonix 40 mg tablet daily, Zocor 20 mg tablet daily, Benifiber tabs. No known drug allergies.   Family History:   Positive for diabetes in her mother, high blood pressure in her brother.  Social History:   The patient is a smoker, a pack per day for 10 days. Does not drink alcohol. She is married and lives with her husband in a 2 story residence.   EXAMINATION: The patient is seated in the exam room in some obvious discomfort favoring the right hip and lower extremity. Alert and oriented. Appears appropriate age. Height 5'7", weight 144 pounds. BMI calculated at 22.6. Vital signs:  temperature 97.7, pulse 94, respiration 18, blood pressure 164/98. HEENT:  pupils are equal, round, and reactive to light. She does have a front upper tooth that is removable. Neck is supple with good range of motion. Chest and lungs clear to auscultation bilaterally. Normal breath sounds. Normal effort. Cardiac:  regular rate and rhythm no murmurs. Abdomen soft, nontender, positive bowel sounds in all 4 quadrants. Cranial nerves grossly intact. Skin:  no signs of rashes or lesions. Rectal/breast exam not indicated for surgery. Musculoskeletal:  examination of right lower extremity shows she is neurovascularly intact. She has intact dorsiflexion/plantar flexion of the ankle. Denies any calf tenderness with palpation.  Significant limitation of range of motion with the right hip. Passive motion is painful. Ambulating with an antalgic gait   with the assistance of a gait aid.    IMAGING: No new images obtained today. AP pelvis and lateral right hip taken on 12-02-11 show severe osteoarthritis with cystic changes to the femoral head, severe joint space narrowing, bone-on-bone contact in bilateral hips, right greater than left.   ASSESSMENT:   1.  Bilateral hip OA,  right greater than left.  2.  Hypertension. 3.  Hyperlipidemia.   PLAN: The risks and benefits of right total hip arthroplasty were discussed again today and patient wishes to proceed. She has a preadmission appointment scheduled on 12-29-11. Surgery scheduled for 01-07-12. She does wish to go home following surgery but did discuss the possible need for skilled nursing facility. She is to obtain medical and cardiac clearance from her primary care physician Dr. Burchette. She was given preoperative instructions. Also discussed postoperative DVT prophylaxis with Lovenox and perioperative antibiotics with Ancef. If she has any further questions or concerns prior to surgery she may contact the office.   Stevin Bielinski A. Ramiah Helfrich, PA-C 

## 2012-01-06 MED ORDER — CHLORHEXIDINE GLUCONATE 4 % EX LIQD: 60.0000 mL | Freq: Once | CUTANEOUS | Status: DC

## 2012-01-06 MED ORDER — SODIUM CHLORIDE 0.9 % IV SOLN: INTRAVENOUS | Status: DC

## 2012-01-06 MED ORDER — CEFAZOLIN SODIUM-DEXTROSE 2-3 GM-% IV SOLR
2.0000 g | INTRAVENOUS | Status: AC
  Administered 2012-01-07: 2 g via INTRAVENOUS
  Filled 2012-01-06: qty 50

## 2012-01-06 MED ORDER — ACETAMINOPHEN 10 MG/ML IV SOLN
1000.0000 mg | Freq: Four times a day (QID) | INTRAVENOUS | Status: DC
  Administered 2012-01-07: 1000 mg via INTRAVENOUS
  Filled 2012-01-06 (×4): qty 100

## 2012-01-07 ENCOUNTER — Encounter (HOSPITAL_COMMUNITY)
Admission: RE | Disposition: A | Discharge status: Home Health | Payer: Self-pay | Source: Ambulatory Visit | Attending: Orthopedic Surgery

## 2012-01-07 ENCOUNTER — Inpatient Hospital Stay (HOSPITAL_COMMUNITY): Payer: Medicare Other

## 2012-01-07 ENCOUNTER — Encounter (HOSPITAL_COMMUNITY): Payer: Self-pay

## 2012-01-07 ENCOUNTER — Inpatient Hospital Stay (HOSPITAL_COMMUNITY)
Admission: RE | Admit: 2012-01-07 | Discharge: 2012-01-09 | DRG: 470 | Disposition: A | Discharge status: Home Health | Payer: Medicare Other | Source: Ambulatory Visit | Attending: Orthopedic Surgery | Admitting: Orthopedic Surgery

## 2012-01-07 DIAGNOSIS — D62 Acute posthemorrhagic anemia: Secondary | ICD-10-CM | POA: Diagnosis not present

## 2012-01-07 DIAGNOSIS — Z01811 Encounter for preprocedural respiratory examination: Secondary | ICD-10-CM

## 2012-01-07 DIAGNOSIS — Z79899 Other long term (current) drug therapy: Secondary | ICD-10-CM

## 2012-01-07 DIAGNOSIS — R32 Unspecified urinary incontinence: Secondary | ICD-10-CM | POA: Diagnosis present

## 2012-01-07 DIAGNOSIS — M545 Low back pain, unspecified: Secondary | ICD-10-CM | POA: Diagnosis present

## 2012-01-07 DIAGNOSIS — M169 Osteoarthritis of hip, unspecified: Principal | ICD-10-CM | POA: Diagnosis present

## 2012-01-07 DIAGNOSIS — Z01812 Encounter for preprocedural laboratory examination: Secondary | ICD-10-CM

## 2012-01-07 DIAGNOSIS — M1990 Unspecified osteoarthritis, unspecified site: Secondary | ICD-10-CM | POA: Diagnosis present

## 2012-01-07 DIAGNOSIS — I10 Essential (primary) hypertension: Secondary | ICD-10-CM | POA: Diagnosis present

## 2012-01-07 DIAGNOSIS — E785 Hyperlipidemia, unspecified: Secondary | ICD-10-CM | POA: Diagnosis present

## 2012-01-07 DIAGNOSIS — M1610 Unilateral primary osteoarthritis, unspecified hip: Principal | ICD-10-CM | POA: Diagnosis present

## 2012-01-07 HISTORY — PX: TOTAL HIP ARTHROPLASTY: SHX124

## 2012-01-07 SURGERY — ARTHROPLASTY, HIP, TOTAL,POSTERIOR APPROACH
Anesthesia: General | Site: Hip | Laterality: Right | Wound class: Clean

## 2012-01-07 MED ORDER — BUPIVACAINE-EPINEPHRINE (PF) 0.5% -1:200000 IJ SOLN
INTRAMUSCULAR | Status: AC
  Filled 2012-01-07: qty 10

## 2012-01-07 MED ORDER — MENTHOL 3 MG MT LOZG: 1.0000 | LOZENGE | OROMUCOSAL | Status: DC | PRN

## 2012-01-07 MED ORDER — OXYCODONE HCL 5 MG PO TABS: 5.0000 mg | ORAL_TABLET | ORAL | Status: DC | PRN

## 2012-01-07 MED ORDER — PANTOPRAZOLE SODIUM 40 MG PO TBEC
40.0000 mg | DELAYED_RELEASE_TABLET | Freq: Every day | ORAL | Status: DC
  Administered 2012-01-08 – 2012-01-09 (×2): 40 mg via ORAL

## 2012-01-07 MED ORDER — ACETAMINOPHEN 650 MG RE SUPP: 650.0000 mg | Freq: Four times a day (QID) | RECTAL | Status: DC | PRN

## 2012-01-07 MED ORDER — ONDANSETRON HCL 4 MG/2ML IJ SOLN
INTRAMUSCULAR | Status: DC | PRN
  Administered 2012-01-07: 4 mg via INTRAVENOUS

## 2012-01-07 MED ORDER — METHOCARBAMOL 500 MG PO TABS: 500.0000 mg | ORAL_TABLET | Freq: Four times a day (QID) | ORAL | Status: DC

## 2012-01-07 MED ORDER — ENOXAPARIN SODIUM 30 MG/0.3ML ~~LOC~~ SOLN: 30.0000 mg | Freq: Two times a day (BID) | SUBCUTANEOUS | Status: DC

## 2012-01-07 MED ORDER — ONDANSETRON HCL 4 MG/2ML IJ SOLN: 4.0000 mg | Freq: Four times a day (QID) | INTRAMUSCULAR | Status: DC | PRN

## 2012-01-07 MED ORDER — METOCLOPRAMIDE HCL 5 MG/ML IJ SOLN: 10.0000 mg | Freq: Once | INTRAMUSCULAR | Status: DC | PRN

## 2012-01-07 MED ORDER — ACETAMINOPHEN 10 MG/ML IV SOLN
INTRAVENOUS | Status: AC
  Filled 2012-01-07: qty 100

## 2012-01-07 MED ORDER — BISACODYL 10 MG RE SUPP: 10.0000 mg | Freq: Every day | RECTAL | Status: DC | PRN

## 2012-01-07 MED ORDER — METHOCARBAMOL 100 MG/ML IJ SOLN
500.0000 mg | Freq: Four times a day (QID) | INTRAVENOUS | Status: DC | PRN
  Filled 2012-01-07: qty 5

## 2012-01-07 MED ORDER — ENOXAPARIN SODIUM 30 MG/0.3ML ~~LOC~~ SOLN
30.0000 mg | Freq: Two times a day (BID) | SUBCUTANEOUS | Status: DC
  Administered 2012-01-08 – 2012-01-09 (×3): 30 mg via SUBCUTANEOUS
  Filled 2012-01-07 (×5): qty 0.3

## 2012-01-07 MED ORDER — HYDROCHLOROTHIAZIDE 25 MG PO TABS
25.0000 mg | ORAL_TABLET | Freq: Every day | ORAL | Status: DC
  Administered 2012-01-08 – 2012-01-09 (×2): 25 mg via ORAL
  Filled 2012-01-07 (×3): qty 1

## 2012-01-07 MED ORDER — SIMVASTATIN 20 MG PO TABS
20.0000 mg | ORAL_TABLET | Freq: Every day | ORAL | Status: DC
  Administered 2012-01-07 – 2012-01-09 (×3): 20 mg via ORAL
  Filled 2012-01-07 (×3): qty 1

## 2012-01-07 MED ORDER — FLUTICASONE PROPIONATE 50 MCG/ACT NA SUSP
2.0000 | Freq: Every day | NASAL | Status: DC
  Administered 2012-01-08 – 2012-01-09 (×2): 2 via NASAL
  Filled 2012-01-07: qty 16

## 2012-01-07 MED ORDER — HYDROXYZINE HCL 25 MG PO TABS: 25.0000 mg | ORAL_TABLET | Freq: Three times a day (TID) | ORAL | Status: DC | PRN

## 2012-01-07 MED ORDER — OXYCODONE HCL 5 MG PO TABS: 5.0000 mg | ORAL_TABLET | Freq: Once | ORAL | Status: DC | PRN

## 2012-01-07 MED ORDER — DOCUSATE SODIUM 100 MG PO CAPS
100.0000 mg | ORAL_CAPSULE | Freq: Two times a day (BID) | ORAL | Status: DC
  Administered 2012-01-07 – 2012-01-09 (×5): 100 mg via ORAL
  Filled 2012-01-07 (×6): qty 1

## 2012-01-07 MED ORDER — CEFAZOLIN SODIUM 1-5 GM-% IV SOLN
1.0000 g | Freq: Four times a day (QID) | INTRAVENOUS | Status: AC
  Administered 2012-01-07 (×2): 1 g via INTRAVENOUS
  Filled 2012-01-07 (×2): qty 50

## 2012-01-07 MED ORDER — ACETAMINOPHEN 10 MG/ML IV SOLN
1000.0000 mg | Freq: Four times a day (QID) | INTRAVENOUS | Status: AC
  Administered 2012-01-07 – 2012-01-08 (×4): 1000 mg via INTRAVENOUS
  Filled 2012-01-07 (×4): qty 100

## 2012-01-07 MED ORDER — HYDROMORPHONE HCL PF 1 MG/ML IJ SOLN: 0.2500 mg | INTRAMUSCULAR | Status: DC | PRN

## 2012-01-07 MED ORDER — ROCURONIUM BROMIDE 100 MG/10ML IV SOLN
INTRAVENOUS | Status: DC | PRN
  Administered 2012-01-07: 5 mg via INTRAVENOUS
  Administered 2012-01-07: 40 mg via INTRAVENOUS

## 2012-01-07 MED ORDER — NEOSTIGMINE METHYLSULFATE 1 MG/ML IJ SOLN
INTRAMUSCULAR | Status: DC | PRN
  Administered 2012-01-07: 4 mg via INTRAVENOUS

## 2012-01-07 MED ORDER — AMLODIPINE BESYLATE 5 MG PO TABS
5.0000 mg | ORAL_TABLET | Freq: Every day | ORAL | Status: DC
  Administered 2012-01-08 – 2012-01-09 (×2): 5 mg via ORAL
  Filled 2012-01-07 (×3): qty 1

## 2012-01-07 MED ORDER — METOCLOPRAMIDE HCL 5 MG PO TABS
5.0000 mg | ORAL_TABLET | Freq: Three times a day (TID) | ORAL | Status: DC | PRN
  Filled 2012-01-07: qty 2

## 2012-01-07 MED ORDER — SODIUM CHLORIDE 0.9 % IV SOLN
INTRAVENOUS | Status: DC
  Administered 2012-01-07: 14:00:00 via INTRAVENOUS

## 2012-01-07 MED ORDER — OXYCODONE-ACETAMINOPHEN 5-325 MG PO TABS: ORAL_TABLET | ORAL | Status: DC

## 2012-01-07 MED ORDER — HYDROMORPHONE HCL PF 1 MG/ML IJ SOLN: 1.0000 mg | INTRAMUSCULAR | Status: DC | PRN

## 2012-01-07 MED ORDER — DEXAMETHASONE SODIUM PHOSPHATE 4 MG/ML IJ SOLN
INTRAMUSCULAR | Status: DC | PRN
  Administered 2012-01-07: 8 mg via INTRAVENOUS

## 2012-01-07 MED ORDER — MIDAZOLAM HCL 5 MG/5ML IJ SOLN
INTRAMUSCULAR | Status: DC | PRN
  Administered 2012-01-07 (×2): 1 mg via INTRAVENOUS

## 2012-01-07 MED ORDER — PHENYLEPHRINE HCL 10 MG/ML IJ SOLN
INTRAMUSCULAR | Status: DC | PRN
  Administered 2012-01-07 (×10): 40 ug via INTRAVENOUS
  Administered 2012-01-07: 80 ug via INTRAVENOUS

## 2012-01-07 MED ORDER — LACTATED RINGERS IV SOLN
INTRAVENOUS | Status: DC | PRN
  Administered 2012-01-07 (×2): via INTRAVENOUS

## 2012-01-07 MED ORDER — LIDOCAINE HCL (CARDIAC) 20 MG/ML IV SOLN
INTRAVENOUS | Status: DC | PRN
  Administered 2012-01-07: 100 mg via INTRAVENOUS

## 2012-01-07 MED ORDER — SODIUM CHLORIDE 0.9 % IR SOLN
Status: DC | PRN
  Administered 2012-01-07: 1000 mL

## 2012-01-07 MED ORDER — ACETAMINOPHEN 325 MG PO TABS: 650.0000 mg | ORAL_TABLET | Freq: Four times a day (QID) | ORAL | Status: DC | PRN

## 2012-01-07 MED ORDER — FLEET ENEMA 7-19 GM/118ML RE ENEM: 1.0000 | ENEMA | Freq: Once | RECTAL | Status: AC | PRN

## 2012-01-07 MED ORDER — GLYCOPYRROLATE 0.2 MG/ML IJ SOLN
INTRAMUSCULAR | Status: DC | PRN
  Administered 2012-01-07: 0.6 mg via INTRAVENOUS

## 2012-01-07 MED ORDER — PROPOFOL 10 MG/ML IV BOLUS
INTRAVENOUS | Status: DC | PRN
  Administered 2012-01-07: 150 mg via INTRAVENOUS

## 2012-01-07 MED ORDER — PHENOL 1.4 % MT LIQD: 1.0000 | OROMUCOSAL | Status: DC | PRN

## 2012-01-07 MED ORDER — CALCIUM CARBONATE 1250 (500 CA) MG PO TABS
1.0000 | ORAL_TABLET | Freq: Every day | ORAL | Status: DC
  Administered 2012-01-08 – 2012-01-09 (×2): 500 mg via ORAL
  Filled 2012-01-07 (×3): qty 1

## 2012-01-07 MED ORDER — METOPROLOL SUCCINATE ER 50 MG PO TB24
50.0000 mg | ORAL_TABLET | Freq: Every day | ORAL | Status: DC
  Administered 2012-01-08 – 2012-01-09 (×2): 50 mg via ORAL
  Filled 2012-01-07 (×3): qty 1

## 2012-01-07 MED ORDER — SENNOSIDES-DOCUSATE SODIUM 8.6-50 MG PO TABS: 1.0000 | ORAL_TABLET | Freq: Every evening | ORAL | Status: DC | PRN

## 2012-01-07 MED ORDER — LOSARTAN POTASSIUM 50 MG PO TABS
100.0000 mg | ORAL_TABLET | Freq: Every day | ORAL | Status: DC
  Administered 2012-01-07 – 2012-01-09 (×3): 100 mg via ORAL
  Filled 2012-01-07 (×3): qty 2

## 2012-01-07 MED ORDER — METHOCARBAMOL 500 MG PO TABS
500.0000 mg | ORAL_TABLET | Freq: Four times a day (QID) | ORAL | Status: DC | PRN
  Filled 2012-01-07: qty 1

## 2012-01-07 MED ORDER — FENTANYL CITRATE 0.05 MG/ML IJ SOLN
INTRAMUSCULAR | Status: DC | PRN
  Administered 2012-01-07 (×3): 50 ug via INTRAVENOUS
  Administered 2012-01-07: 100 ug via INTRAVENOUS

## 2012-01-07 MED ORDER — ONDANSETRON HCL 4 MG PO TABS: 4.0000 mg | ORAL_TABLET | Freq: Four times a day (QID) | ORAL | Status: DC | PRN

## 2012-01-07 MED ORDER — CALCIUM CARBONATE-VITAMIN D 600-400 MG-UNIT PO TABS: 1.0000 | ORAL_TABLET | Freq: Every day | ORAL | Status: DC

## 2012-01-07 MED ORDER — OXYCODONE HCL 5 MG/5ML PO SOLN: 5.0000 mg | Freq: Once | ORAL | Status: DC | PRN

## 2012-01-07 MED ORDER — METOCLOPRAMIDE HCL 5 MG/ML IJ SOLN: 5.0000 mg | Freq: Three times a day (TID) | INTRAMUSCULAR | Status: DC | PRN

## 2012-01-07 SURGICAL SUPPLY — 57 items
BLADE SAW SAG 73X25 THK (BLADE) ×1
BLADE SAW SGTL 73X25 THK (BLADE) ×1 IMPLANT
BRUSH FEMORAL CANAL (MISCELLANEOUS) IMPLANT
CLOTH BEACON ORANGE TIMEOUT ST (SAFETY) ×2 IMPLANT
COVER BACK TABLE 24X17X13 BIG (DRAPES) IMPLANT
COVER SURGICAL LIGHT HANDLE (MISCELLANEOUS) ×2 IMPLANT
DRAPE INCISE IOBAN 66X45 STRL (DRAPES) ×2 IMPLANT
DRAPE ORTHO SPLIT 77X108 STRL (DRAPES) ×2
DRAPE SURG ORHT 6 SPLT 77X108 (DRAPES) ×2 IMPLANT
DRAPE U-SHAPE 47X51 STRL (DRAPES) ×2 IMPLANT
DRSG ADAPTIC 3X8 NADH LF (GAUZE/BANDAGES/DRESSINGS) ×2 IMPLANT
DRSG PAD ABDOMINAL 8X10 ST (GAUZE/BANDAGES/DRESSINGS) ×4 IMPLANT
DURAPREP 26ML APPLICATOR (WOUND CARE) ×2 IMPLANT
ELECT CAUTERY BLADE 6.4 (BLADE) ×2 IMPLANT
ELECT REM PT RETURN 9FT ADLT (ELECTROSURGICAL) ×2
ELECTRODE REM PT RTRN 9FT ADLT (ELECTROSURGICAL) ×1 IMPLANT
EVACUATOR 1/8 PVC DRAIN (DRAIN) IMPLANT
FACESHIELD LNG OPTICON STERILE (SAFETY) ×4 IMPLANT
GLOVE BIOGEL PI IND STRL 8 (GLOVE) ×2 IMPLANT
GLOVE BIOGEL PI INDICATOR 8 (GLOVE) ×2
GLOVE ORTHO TXT STRL SZ7.5 (GLOVE) ×4 IMPLANT
GLOVE SURG ORTHO 8.0 STRL STRW (GLOVE) ×8 IMPLANT
GOWN PREVENTION PLUS XLARGE (GOWN DISPOSABLE) ×4 IMPLANT
GOWN PREVENTION PLUS XXLARGE (GOWN DISPOSABLE) ×2 IMPLANT
GOWN STRL NON-REIN LRG LVL3 (GOWN DISPOSABLE) ×2 IMPLANT
HANDPIECE INTERPULSE COAX TIP (DISPOSABLE)
IMMOBILIZER KNEE 20 (SOFTGOODS)
IMMOBILIZER KNEE 20 THIGH 36 (SOFTGOODS) IMPLANT
IMMOBILIZER KNEE 22 UNIV (SOFTGOODS) ×2 IMPLANT
IMMOBILIZER KNEE 24 THIGH 36 (MISCELLANEOUS) IMPLANT
IMMOBILIZER KNEE 24 UNIV (MISCELLANEOUS)
KIT BASIN OR (CUSTOM PROCEDURE TRAY) ×2 IMPLANT
KIT ROOM TURNOVER OR (KITS) ×2 IMPLANT
MANIFOLD NEPTUNE II (INSTRUMENTS) ×2 IMPLANT
NEEDLE 22X1 1/2 (OR ONLY) (NEEDLE) ×2 IMPLANT
NEEDLE MAYO TROCAR (NEEDLE) IMPLANT
NS IRRIG 1000ML POUR BTL (IV SOLUTION) ×2 IMPLANT
PACK TOTAL JOINT (CUSTOM PROCEDURE TRAY) ×2 IMPLANT
PAD ARMBOARD 7.5X6 YLW CONV (MISCELLANEOUS) ×4 IMPLANT
PRESSURIZER FEMORAL UNIV (MISCELLANEOUS) IMPLANT
SET HNDPC FAN SPRY TIP SCT (DISPOSABLE) IMPLANT
SPONGE GAUZE 4X4 12PLY (GAUZE/BANDAGES/DRESSINGS) ×2 IMPLANT
STAPLER VISISTAT 35W (STAPLE) ×2 IMPLANT
SUCTION FRAZIER TIP 10 FR DISP (SUCTIONS) ×2 IMPLANT
SUT ETHIBOND NAB CT1 #1 30IN (SUTURE) ×8 IMPLANT
SUT VIC AB 0 CT1 27 (SUTURE) ×2
SUT VIC AB 0 CT1 27XBRD ANBCTR (SUTURE) ×1 IMPLANT
SUT VIC AB 1 CTB1 27 (SUTURE) ×4 IMPLANT
SUT VIC AB 2-0 CT1 27 (SUTURE) ×4
SUT VIC AB 2-0 CT1 TAPERPNT 27 (SUTURE) ×2 IMPLANT
SYR CONTROL 10ML LL (SYRINGE) ×2 IMPLANT
TAPE CLOTH SURG 4X10 WHT LF (GAUZE/BANDAGES/DRESSINGS) ×2 IMPLANT
TOWEL OR 17X24 6PK STRL BLUE (TOWEL DISPOSABLE) ×2 IMPLANT
TOWEL OR 17X26 10 PK STRL BLUE (TOWEL DISPOSABLE) ×2 IMPLANT
TOWER CARTRIDGE SMART MIX (DISPOSABLE) IMPLANT
TRAY FOLEY CATH 14FR (SET/KITS/TRAYS/PACK) ×2 IMPLANT
WATER STERILE IRR 1000ML POUR (IV SOLUTION) ×6 IMPLANT

## 2012-01-07 NOTE — Brief Op Note (Signed)
01/07/2012  10:03 AM  PATIENT:  [name] Frank  77 y.o. female  PRE-OPERATIVE DIAGNOSIS:  DJD RIGHT HIP  POST-OPERATIVE DIAGNOSIS:  DJD RIGHT HIP  PROCEDURE:  Procedure(s) (LRB) with comments: TOTAL HIP ARTHROPLASTY (Right)  SURGEON:  Surgeon(s) and Role:    * W D Carloyn Manner., MD - Primary  PHYSICIAN ASSISTANT:   ASSISTANTS: Margart Sickles, PA-C   ANESTHESIA:   general  EBL:  Total I/O In: 1000 [I.V.:1000] Out: 300 [Urine:100; Blood:200]  BLOOD ADMINISTERED:none  DRAINS: none   LOCAL MEDICATIONS USED:  NONE  SPECIMEN:  No Specimen  DISPOSITION OF SPECIMEN:  N/A  COUNTS:  YES  TOURNIQUET:  * No tourniquets in log *  DICTATION: .Other Dictation: Dictation Number   PLAN OF CARE: Admit to inpatient   PATIENT DISPOSITION:  PACU - hemodynamically stable.   Delay start of Pharmacological VTE agent (>24hrs) due to surgical blood loss or risk of bleeding: yes

## 2012-01-07 NOTE — Transfer of Care (Signed)
Immediate Anesthesia Transfer of Care Note  Patient: [name] Frank  Procedure(s) Performed: Procedure(s) (LRB) with comments: TOTAL HIP ARTHROPLASTY (Right)  Patient Location: PACU  Anesthesia Type: General  Level of Consciousness: awake, alert  and oriented  Airway & Oxygen Therapy: Patient Spontanous Breathing and Patient connected to face mask oxygen  Post-op Assessment: Report given to PACU RN  Post vital signs: Reviewed and stable  Complications: No apparent anesthesia complications

## 2012-01-07 NOTE — Preoperative (Signed)
Beta Blockers   Reason not to administer Beta Blockers:Not Applicable, Took am dose

## 2012-01-07 NOTE — Interval H&P Note (Signed)
History and Physical Interval Note:  01/07/2012 7:41 AM  [name] Frank  has presented today for surgery, with the diagnosis of DJD RIGHT HIP  The various methods of treatment have been discussed with the patient and family. After consideration of risks, benefits and other options for treatment, the patient has consented to  Procedure(s) (LRB) with comments: TOTAL HIP ARTHROPLASTY (Right) as a surgical intervention .  The patient's history has been reviewed, patient examined, no change in status, stable for surgery.  I have reviewed the patient's chart and labs.  Questions were answered to the patient's satisfaction.     [name] Frank,W D

## 2012-01-07 NOTE — Anesthesia Postprocedure Evaluation (Signed)
Anesthesia Post Note  Patient: [name] Frank  Procedure(s) Performed: Procedure(s) (LRB): TOTAL HIP ARTHROPLASTY (Right)  Anesthesia type: general  Patient location: PACU  Post pain: Pain level controlled  Post assessment: Patient's Cardiovascular Status Stable  Last Vitals:  Filed Vitals:   01/07/12 1015  BP:   Pulse:   Temp: 36.2 C  Resp:     Post vital signs: Reviewed and stable  Level of consciousness: sedated  Complications: No apparent anesthesia complications

## 2012-01-07 NOTE — Plan of Care (Signed)
Problem: Consults Goal: Diagnosis- Total Joint Replacement Primary Total Hip RIGHT     

## 2012-01-07 NOTE — H&P (View-Only) (Signed)
[facility] [address]   [address] [place], [place] [address] [phone] A Division of [facility]  Loreta Ave, M.D.   Robert A. Thurston Hole, M.D.   Burnell Blanks, M.D.   Eulas Post, M.D.   Lunette Stands, M.D Buford Dresser, M.D.  Charlsie Quest, M.D.   Estell Harpin, M.D.   Melina Fiddler, M.D. Genene Churn. Barry Dienes, PA-C            Kirstin A. Shepperson, PA-C Josh [place], PA-C [place], [place]  RE: [name], Frank   [id number]      DOB: Feb 25, 1935 PROGRESS NOTE: 12-23-11 Follow-up right hip osteoarthritis.   HPI: The patient is a 77 year old female with a history of right hip osteoarthritis. Also with a history of hypertension and hyperlipidemia. She presents today for further discussion of continued right hip pain and decrease in her range of motion. She has failed conservative treatment with p.o. NSAID, pain medication, intra-articular hip injection, as well as lumbosacral injections. The pain and limitation of her motion is significantly affecting her quality of life and activities of daily living. It will awaken her from sleep at night. She uses a single point cane for a gait aid. The patient is at her wits end and would like to further discuss surgical intervention at this time.  Review of Systems:   A 10-point review of systems obtained. Positive for glasses, high blood pressure, ankle swelling, frequent urination, night urination, weight loss. Past Medical History:   Positive for osteoarthritis right hip, hypertension, hyperlipidemia. Past Surgical History:   Gallbladder, hysterectomy, and a back surgery.  Current Medications:   Norvasc 5 mg daily, Astelin 137 mcg nasal spray b.i.d., calcium carbonate and vitamin D one tablet daily, fexofenadine 180 mg tablet daily, Flonase 50 mcg/Act nasal spray 2 sprays into each nose daily, hydrochlorothiazide 25 mg tablet daily, hydroxyzine 25 mg tablet t.i.d.  p.r.n. itching, losartan 100 mg tablet daily, meloxicam 15 mg tablet daily and as needed, metoprolol 50 mg 24 hour tablet daily, Percocet 10/325 q. 4 hours p.r.n. pain, Protonix 40 mg tablet daily, Zocor 20 mg tablet daily, Benifiber tabs. No known drug allergies.   Family History:   Positive for diabetes in her mother, high blood pressure in her brother.  Social History:   The patient is a smoker, a pack per day for 10 days. Does not drink alcohol. She is married and lives with her husband in a 2 story residence.   EXAMINATION: The patient is seated in the exam room in some obvious discomfort favoring the right hip and lower extremity. Alert and oriented. Appears appropriate age. Height 5'7", weight 144 pounds. BMI calculated at 22.6. Vital signs:  temperature 97.7, pulse 94, respiration 18, blood pressure 164/98. HEENT:  pupils are equal, round, and reactive to light. She does have a front upper tooth that is removable. Neck is supple with good range of motion. Chest and lungs clear to auscultation bilaterally. Normal breath sounds. Normal effort. Cardiac:  regular rate and rhythm no murmurs. Abdomen soft, nontender, positive bowel sounds in all 4 quadrants. Cranial nerves grossly intact. Skin:  no signs of rashes or lesions. Rectal/breast exam not indicated for surgery. Musculoskeletal:  examination of right lower extremity shows she is neurovascularly intact. She has intact dorsiflexion/plantar flexion of the ankle. Denies any calf tenderness with palpation.  Significant limitation of range of motion with the right hip. Passive motion is painful. Ambulating with an antalgic gait  with the assistance of a gait aid.    IMAGING: No new images obtained today. AP pelvis and lateral right hip taken on 12-02-11 show severe osteoarthritis with cystic changes to the femoral head, severe joint space narrowing, bone-on-bone contact in bilateral hips, right greater than left.   ASSESSMENT:   1.  Bilateral hip OA,  right greater than left.  2.  Hypertension. 3.  Hyperlipidemia.   PLAN: The risks and benefits of right total hip arthroplasty were discussed again today and patient wishes to proceed. She has a preadmission appointment scheduled on 12-29-11. Surgery scheduled for 01-07-12. She does wish to go home following surgery but did discuss the possible need for skilled nursing facility. She is to obtain medical and cardiac clearance from her primary care physician Dr. Caryl Never. She was given preoperative instructions. Also discussed postoperative DVT prophylaxis with Lovenox and perioperative antibiotics with Ancef. If she has any further questions or concerns prior to surgery she may contact the office.   [name] Januszewski A. Frazier Balfour, PA-C

## 2012-01-07 NOTE — Anesthesia Preprocedure Evaluation (Signed)
Anesthesia Evaluation  Patient identified by MRN, date of birth, ID band Patient awake    Reviewed: Allergy & Precautions, H&P , NPO status , Patient's Chart, lab work & pertinent test results, reviewed documented beta blocker date and time   Airway Mallampati: II TM Distance: >3 FB Neck ROM: full    Dental   Pulmonary neg pulmonary ROS,  breath sounds clear to auscultation        Cardiovascular hypertension, On Medications and On Home Beta Blockers Rhythm:regular     Neuro/Psych  Neuromuscular disease negative psych ROS   GI/Hepatic Neg liver ROS, Medicated and Controlled,  Endo/Other  negative endocrine ROS  Renal/GU negative Renal ROS  negative genitourinary   Musculoskeletal   Abdominal   Peds  Hematology negative hematology ROS (+)   Anesthesia Other Findings See surgeon's H&P   Reproductive/Obstetrics negative OB ROS                           Anesthesia Physical Anesthesia Plan  ASA: II  Anesthesia Plan: General   Post-op Pain Management:    Induction: Intravenous  Airway Management Planned: Oral ETT  Additional Equipment:   Intra-op Plan:   Post-operative Plan: Extubation in OR  Informed Consent: I have reviewed the patients History and Physical, chart, labs and discussed the procedure including the risks, benefits and alternatives for the proposed anesthesia with the patient or authorized representative who has indicated his/her understanding and acceptance.   Dental Advisory Given  Plan Discussed with: CRNA and Surgeon  Anesthesia Plan Comments:         Anesthesia Quick Evaluation

## 2012-01-07 NOTE — Op Note (Signed)
[name] Frank, LIVECCHI NO.:  [id number]  MEDICAL RECORD NO.:  [id number]  LOCATION:  5N02C                        FACILITY:  [facility]  PHYSICIAN:  Dyke Brackett, M.D.    DATE OF BIRTH:  February 16, 1935  DATE OF PROCEDURE:  01/07/2012 DATE OF DISCHARGE:                              OPERATIVE REPORT   PREOPERATIVE DIAGNOSIS:  Severe osteoarthritis, right hip.  POSTOPERATIVE DIAGNOSIS:  Severe osteoarthritis, right hip.  OPERATION:  Right total hip replacement (DePuy AML porous-coated stem, size 15 small stature 32 mm hip ball +1.5 mm neck length with 50-mm acetabulum Sector Gription cup 100 series).  SURGEON:  Dyke Brackett, M.D.  ASSISTANT:  Margart Sickles, PA-C.  BLOOD LOSS:  Approximately 400.  DESCRIPTION OF PROCEDURE:  Sterile prep and drape, lateral positioning posterior approach to the hip made with splitting the iliotibial band and gluteus maximus.  We split the capsule and did a T capsulotomy.  I dislocated the head and cut it about 1 fingerbreadth above the lesser trochanter.  Progressively reamed and rasped to accept the 50 mm small stature stem.  Attention was next directed to the acetabulum.  Acetabular retractors were placed.  The beginnings of protrusio noted, which had already eroded down to the tear drop.  We reamed 1 mm under reaming 49 mm for 50 cup, placed trial cup which would not bottom out.  We then elected to use 100 series Gription cup size 50, placed the final cup and put a trial poly liner in it.  We then trialed the trial broach and noted excellent stability with the trials and with no tendency to dislocate. Good restoration of leg lengths.  The patient was probably had mild shortening on the opposite side from similar hip disease.  Trial components were removed.  The final poly was placed after apex screw was placed on the cup and the buildup on the poly placed at about the 10 o'clock position.  The final prosthesis was placed with  the 1.5 mm neck length, 32 mm hip ball.  Again all parameters were deemed to be acceptable.  T capsulotomy was closed with interrupted Ethibond.  The fascia with a running #1 Ethibond.  The subcu tissues with 2-0 Vicryl.  Skin with skin clips.  Marcaine with epinephrine over the skin.  Light compressive sterile dressing and knee immobilizer applied.  Taken to the recovery room in a stable condition.     Dyke Brackett, M.D.     WDC/MEDQ  D:  01/07/2012  T:  01/07/2012  Job:  [id number]

## 2012-01-08 ENCOUNTER — Encounter (HOSPITAL_COMMUNITY): Payer: Self-pay | Admitting: *Deleted

## 2012-01-08 DIAGNOSIS — D62 Acute posthemorrhagic anemia: Secondary | ICD-10-CM | POA: Diagnosis not present

## 2012-01-08 LAB — CBC
HCT: 24.4 % — ABNORMAL LOW (ref 36.0–46.0)
Hemoglobin: 8.1 g/dL — ABNORMAL LOW (ref 12.0–15.0)
MCH: 27.1 pg (ref 26.0–34.0)
MCHC: 33.2 g/dL (ref 30.0–36.0)

## 2012-01-08 LAB — BASIC METABOLIC PANEL
BUN: 16 mg/dL (ref 6–23)
CO2: 26 mEq/L (ref 19–32)
GFR calc non Af Amer: 63 mL/min — ABNORMAL LOW (ref >90)
Glucose, Bld: 113 mg/dL — ABNORMAL HIGH (ref 70–99)
Potassium: 3.7 mEq/L (ref 3.5–5.1)

## 2012-01-08 MED ORDER — FUROSEMIDE 10 MG/ML IJ SOLN
20.0000 mg | Freq: Once | INTRAMUSCULAR | Status: AC
  Administered 2012-01-08: 20 mg via INTRAVENOUS
  Filled 2012-01-08: qty 2

## 2012-01-08 NOTE — Clinical Social Work Note (Signed)
Clinical Social Work   CSW received consult for SNF. CSW reviewed chart. PT is recommending home health PT at discharge. CSW is signing off as no further needs identified. Please reconsult if a need arises prior to discharge.   Dede Query, MSW, Theresia Majors [phone]

## 2012-01-08 NOTE — Evaluation (Signed)
Physical Therapy Evaluation Patient Details Name: [name] Frank MRN: [id number] DOB: 06-05-1934 Today's Date: 01/08/2012 Time: 5784-6962 PT Time Calculation (min): 28 min  PT Assessment / Plan / Recommendation Clinical Impression  Pt very motivated to participate in PT.  PT indicated to increase mobility and strength to maximize functional activity for safe d/c home with family.    PT Assessment  Patient needs continued PT services    Follow Up Recommendations  Home health PT    Does the patient have the potential to tolerate intense rehabilitation      Barriers to Discharge None      Equipment Recommendations  None recommended by PT    Recommendations for Other Services     Frequency 7X/week    Precautions / Restrictions Precautions Precautions: Posterior Hip Precaution Booklet Issued: Yes (comment) Precaution Comments: educated pt on 3/3 hip precautions, handout provided Required Braces or Orthoses: Knee Immobilizer - Right Knee Immobilizer - Right: Other (comment) (in bed) Restrictions RLE Weight Bearing: Weight bearing as tolerated   Pertinent Vitals/Pain 0/10      Mobility  Bed Mobility Bed Mobility: Supine to Sit Supine to Sit: 4: Min assist Details for Bed Mobility Assistance: verbal cues for sequencing, precautions Transfers Transfers: Sit to Stand;Stand to Sit Sit to Stand: 4: Min assist;From bed;With upper extremity assist Stand to Sit: 4: Min assist;With armrests;To chair/3-in-1 Details for Transfer Assistance: verbal cues for sequencing, hand placement Ambulation/Gait Ambulation/Gait Assistance: 4: Min guard Ambulation Distance (Feet): 15 Feet Assistive device: Rolling walker Ambulation/Gait Assistance Details: verbal cues for RW management Gait Pattern: Step-to pattern Gait velocity: decreased    Shoulder Instructions     Exercises     PT Diagnosis: Difficulty walking;Acute pain  PT Problem List: Decreased strength;Decreased activity  tolerance;Decreased mobility;Decreased knowledge of use of DME;Decreased knowledge of precautions;Pain PT Treatment Interventions: DME instruction;Gait training;Stair training;Functional mobility training;Therapeutic activities;Therapeutic exercise;Patient/family education   PT Goals Acute Rehab PT Goals PT Goal Formulation: With patient Time For Goal Achievement: 01/15/12 Potential to Achieve Goals: Good Pt will go Supine/Side to Sit: with modified independence;with HOB 0 degrees PT Goal: Supine/Side to Sit - Progress: Goal set today Pt will go Sit to Stand: with modified independence;with upper extremity assist PT Goal: Sit to Stand - Progress: Goal set today Pt will go Stand to Sit: with modified independence;with upper extremity assist PT Goal: Stand to Sit - Progress: Goal set today Pt will Ambulate: >150 feet;with supervision;with rolling walker PT Goal: Ambulate - Progress: Goal set today Pt will Go Up / Down Stairs: 3-5 stairs;with rail(s);with supervision PT Goal: Up/Down Stairs - Progress: Goal set today Pt will Perform Home Exercise Program: with supervision, verbal cues required/provided PT Goal: Perform Home Exercise Program - Progress: Goal set today  Visit Information  Last PT Received On: 01/08/12 Assistance Needed: +1    Subjective Data  Subjective: I feel pretty good. Patient Stated Goal: home   Prior Functioning  Home Living Lives With: Spouse Available Help at Discharge: Family Type of Home: House Home Access: Stairs to enter [employer] of Steps: 3 Entrance Stairs-Rails: Left;Right;Can reach both Home Layout: Two level;Able to live on main level with bedroom/bathroom Home Adaptive Equipment: Walker - rolling;Straight cane;Reacher Prior Function Level of Independence: Independent Able to Take Stairs?: Yes Communication Communication: No difficulties    Cognition  Overall Cognitive Status: Appears within functional limits for tasks  assessed/performed Arousal/Alertness: Awake/alert Orientation Level: Appears intact for tasks assessed Behavior During Session: [facility] for tasks performed  Extremity/Trunk Assessment     Balance    End of Session PT - End of Session Equipment Utilized During Treatment: Gait belt Activity Tolerance: Patient tolerated treatment well Patient left: in chair;with call bell/phone within reach;with family/visitor present  GP     Ilda Foil 01/08/2012, 10:50 AM  Aida Raider, PT  Office # [phone] Pager [phone]

## 2012-01-08 NOTE — Progress Notes (Addendum)
   CARE MANAGEMENT NOTE 01/08/2012  Patient:  [name] Frank, [name] Frank   Account Number:  [id number]  Date Initiated:  01/08/2012  Documentation initiated by:  [facility]  Subjective/Objective Assessment:   TOTAL HIP ARTHROPLASTY     Action/Plan:   lives at home with husband. Rubie Maid [phone] will assist with care at home.   Anticipated DC Date:  01/10/2012   Anticipated DC Plan:  HOME W HOME HEALTH SERVICES      DC Planning Services  CM consult      [facility] Choice  HOME HEALTH   Choice offered to / List presented to:  C-4 Adult Children           [facility] agency  [facility]   Status of service:  In process, will continue to follow Medicare Important Message given?   (If response is "NO", the following Medicare IM given date fields will be blank) Date Medicare IM given:   Date Additional Medicare IM given:    Discharge Disposition:    Per UR Regulation:    If discussed at Long Length of Stay Meetings, dates discussed:    Comments:  01/08/2012 1320 NCM spoke to pt and gave permission to speak to dtr. Offered HH choice to daughter. Dtr states she did not have preference for [facility]. Wanted agency that will accept insurance. Requested [facility] for [facility]. Pt has RW, 3n1 and elevated toilet seat at home. Waiting orders and F2F from d/c MD. NCM did follow up with [facility] and are accepting [facility]. Will fax orders to [place].  Isidoro Donning RN CCM Case Mgmt phone [phone]

## 2012-01-08 NOTE — Progress Notes (Signed)
Patient ID: [name] Frank, female   DOB: July 05, 1934, 77 y.o.   MRN: [id number] PATIENT ID: [name] Frank  MRN: [id number]  DOB/AGE:  Jan 24, 1935 / 77 y.o.  1 Day Post-Op Procedure(s) (LRB): TOTAL HIP ARTHROPLASTY (Right)    PROGRESS NOTE Subjective: Patient is alert, oriented,no Nausea, no Vomiting, yes passing gas, no Bowel Movement. Taking PO well. Denies SOB, Chest or Calf Pain. Using Incentive Spirometer, PAS in place. Ambulate well 1+ assist Patient reports pain as 4 on 0-10 scale  .    Objective: Vital signs in last 24 hours: Filed Vitals:   01/08/12 0000 01/08/12 0200 01/08/12 0400 01/08/12 0600  BP:  100/54  103/51  Pulse:  83  88  Temp:  98.4 F (36.9 C)  98.3 F (36.8 C)  TempSrc:      Resp: 18 18 18 18   SpO2: 100% 100% 100% 100%      Intake/Output from previous day: I/O last 3 completed shifts: In: 2240 [P.O.:240; I.V.:2000] Out: 575 [Urine:375; Blood:200]   Intake/Output this shift: Total I/O In: -  Out: 100 [Urine:100]   LABORATORY DATA:  Basename 01/08/12 0545  WBC 11.3*  HGB 8.1*  HCT 24.4*  PLT 153  NA 141  K 3.7  CL 108  CO2 26  BUN 16  CREATININE 0.88  GLUCOSE 113*  GLUCAP --  INR --  CALCIUM 8.1*    Examination: Neurologically intact ABD soft Neurovascular intact Sensation intact distally Intact pulses distally Dorsiflexion/Plantar flexion intact Incision: moderate drainage} XR AP&Lat of hip shows well placed\fixed THA  Assessment:   1 Day Post-Op Procedure(s) (LRB): TOTAL HIP ARTHROPLASTY (Right) ADDITIONAL DIAGNOSIS:  Principal Problem:  *DEGENERATIVE JOINT DISEASE, HIPS Active Problems:  HYPERTENSION  Lumbago  Postoperative anemia due to acute blood loss   Plan: PT/OT WBAT, THA  posterior precautions  DVT Prophylaxis: SCDx72 hrs, ASA 325 mg BID x 2 weeks  DISCHARGE PLAN: Home  DISCHARGE NEEDS: HHPT, HHRN and Walker  will transfuse 2 units of PRBC due to hypotension due to acute postoperative blood loss  anemia.

## 2012-01-08 NOTE — Progress Notes (Signed)
Physical Therapy Treatment Patient Details Name: [name] Frank MRN: [id number] DOB: 1934/03/16 Today's Date: 01/08/2012 Time: 2952-8413 PT Time Calculation (min): 12 min  PT Assessment / Plan / Recommendation Comments on Treatment Session  Pt progressing well.     Follow Up Recommendations  Home health PT     Does the patient have the potential to tolerate intense rehabilitation     Barriers to Discharge None      Equipment Recommendations  None recommended by PT    Recommendations for Other Services    Frequency 7X/week   Plan Discharge plan remains appropriate;Frequency remains appropriate    Precautions / Restrictions Precautions Precautions: Posterior Hip Precaution Booklet Issued: Yes (comment) Precaution Comments: Reviewed 3/3 hip precautions. Required Braces or Orthoses: Knee Immobilizer - Right Knee Immobilizer - Right: Other (comment) (in bed) Restrictions RLE Weight Bearing: Weight bearing as tolerated   Pertinent Vitals/Pain 0/10    Mobility  Bed Mobility Bed Mobility: Sit to Supine Supine to Sit: 4: Min assist Sit to Supine: 4: Min assist Details for Bed Mobility Assistance: verbal cues for sequencing, precautions Transfers Transfers: Sit to Stand;Stand to Sit Sit to Stand: 4: Min assist;From chair/3-in-1;With upper extremity assist Stand to Sit: 4: Min assist;To bed;With upper extremity assist Details for Transfer Assistance: verbal cues for sequencing Ambulation/Gait Ambulation/Gait Assistance: 4: Min guard Ambulation Distance (Feet): 5 Feet Assistive device: Rolling walker Ambulation/Gait Assistance Details: verbal cues to pivot toward L to avoid IR of RLE Gait Pattern: Step-to pattern Gait velocity: decreased    Exercises Total Joint Exercises Ankle Circles/Pumps: AROM;Both;10 reps Heel Slides: AAROM;Right;10 reps Hip ABduction/ADduction: AAROM;Right;10 reps   PT Diagnosis: Difficulty walking;Acute pain  PT Problem List: Decreased  strength;Decreased activity tolerance;Decreased mobility;Decreased knowledge of use of DME;Decreased knowledge of precautions;Pain PT Treatment Interventions: DME instruction;Gait training;Stair training;Functional mobility training;Therapeutic activities;Therapeutic exercise;Patient/family education   PT Goals Acute Rehab PT Goals PT Goal Formulation: With patient Time For Goal Achievement: 01/15/12 Potential to Achieve Goals: Good Pt will go Supine/Side to Sit: with modified independence;with HOB 0 degrees PT Goal: Supine/Side to Sit - Progress: Goal set today Pt will go Sit to Stand: with modified independence;with upper extremity assist PT Goal: Sit to Stand - Progress: Goal set today Pt will go Stand to Sit: with modified independence;with upper extremity assist PT Goal: Stand to Sit - Progress: Goal set today Pt will Ambulate: >150 feet;with supervision;with rolling walker PT Goal: Ambulate - Progress: Goal set today Pt will Go Up / Down Stairs: 3-5 stairs;with rail(s);with supervision PT Goal: Up/Down Stairs - Progress: Goal set today Pt will Perform Home Exercise Program: with supervision, verbal cues required/provided PT Goal: Perform Home Exercise Program - Progress: Goal set today  Visit Information  Last PT Received On: 01/08/12 Assistance Needed: +1    Subjective Data  Subjective: I feel pretty good. Patient Stated Goal: home   Cognition  Overall Cognitive Status: Appears within functional limits for tasks assessed/performed Arousal/Alertness: Awake/alert Orientation Level: Appears intact for tasks assessed Behavior During Session: [facility] for tasks performed    Balance     End of Session PT - End of Session Equipment Utilized During Treatment: Gait belt Activity Tolerance: Patient tolerated treatment well Patient left: in bed;with call bell/phone within reach;with family/visitor present Nurse Communication: Mobility status   GP     Ilda Foil 01/08/2012, 2:35 PM  Aida Raider, PT  Office # [phone] Pager [phone]

## 2012-01-09 LAB — TYPE AND SCREEN

## 2012-01-09 LAB — CBC
HCT: 30.5 % — ABNORMAL LOW (ref 36.0–46.0)
MCH: 27.2 pg (ref 26.0–34.0)
MCV: 82 fL (ref 78.0–100.0)
Platelets: 125 10*3/uL — ABNORMAL LOW (ref 150–400)
RBC: 3.72 MIL/uL — ABNORMAL LOW (ref 3.87–5.11)

## 2012-01-09 MED ORDER — DSS 100 MG PO CAPS: 100.0000 mg | ORAL_CAPSULE | Freq: Two times a day (BID) | ORAL | Status: DC

## 2012-01-09 NOTE — Progress Notes (Signed)
Physical Therapy Treatment Patient Details Name: [name] Frank MRN: [id number] DOB: [birth year] Today's Date: 01/09/2012 Time: 4098-1191 PT Time Calculation (min): 13 min  PT Assessment / Plan / Recommendation Comments on Treatment Session  Increased gait distance noted today.    Follow Up Recommendations  Home health PT     Does the patient have the potential to tolerate intense rehabilitation     Barriers to Discharge        Equipment Recommendations  None recommended by PT    Recommendations for Other Services    Frequency 7X/week   Plan Discharge plan remains appropriate;Frequency remains appropriate    Precautions / Restrictions Precautions Precautions: Posterior Hip Precaution Comments: Reviewed 3/3 hip precautions. Required Braces or Orthoses: Knee Immobilizer - Right Knee Immobilizer - Right: Other (comment) (in bed) Restrictions RLE Weight Bearing: Weight bearing as tolerated   Pertinent Vitals/Pain 0/10    Mobility  Bed Mobility Supine to Sit: 4: Min assist Details for Bed Mobility Assistance: verbal cues for sequencing, precautions Transfers Sit to Stand: 4: Min assist;From bed;With upper extremity assist Stand to Sit: 4: Min guard;To chair/3-in-1;With armrests Details for Transfer Assistance: verbal cues for hand placement Ambulation/Gait Ambulation/Gait Assistance: 4: Min guard Ambulation Distance (Feet): 25 Feet Assistive device: Rolling walker Ambulation/Gait Assistance Details: verbal cues for precautions Gait Pattern: Step-to pattern;Antalgic Gait velocity: decreased    Exercises     PT Diagnosis:    PT Problem List:   PT Treatment Interventions:     PT Goals Acute Rehab PT Goals PT Goal: Supine/Side to Sit - Progress: Progressing toward goal PT Goal: Sit to Stand - Progress: Progressing toward goal PT Goal: Stand to Sit - Progress: Progressing toward goal PT Goal: Ambulate - Progress: Progressing toward goal PT Goal: Up/Down  Stairs - Progress: Progressing toward goal PT Goal: Perform Home Exercise Program - Progress: Progressing toward goal  Visit Information  Last PT Received On: 01/09/12 Assistance Needed: +1    Subjective Data  Subjective: "I slept well." Patient Stated Goal: home   Cognition  Overall Cognitive Status: Appears within functional limits for tasks assessed/performed Arousal/Alertness: Awake/alert Orientation Level: Appears intact for tasks assessed Behavior During Session: [facility] for tasks performed    Balance     End of Session PT - End of Session Equipment Utilized During Treatment: Gait belt Activity Tolerance: Patient tolerated treatment well Patient left: in chair;with call bell/phone within reach   GP     Ilda Foil 01/09/2012, 10:39 AM  Aida Raider, PT  Office # [phone] Pager [phone]

## 2012-01-09 NOTE — Discharge Summary (Signed)
Patient ID: [name] Frank MRN: [id number] DOB/AGE: September 23, 1934 77 y.o.  Admit date: 01/07/2012 Discharge date: 01/09/2012  Admission Diagnoses:  Present on Admission:  .DEGENERATIVE JOINT DISEASE, HIPS .HYPERTENSION .Lumbago .HYPERLIPIDEMIA .URINARY INCONTINENCE .Osteoarthritis   Discharge Diagnoses:  Principal Problem:  *DEGENERATIVE JOINT DISEASE, HIPS Active Problems:  HYPERLIPIDEMIA  HYPERTENSION  URINARY INCONTINENCE  Lumbago  Osteoarthritis  Postoperative anemia due to acute blood loss   Past Medical History  Diagnosis Date  . Allergy   . Hyperlipidemia   . Hypertension   . Anxiety   . GERD (gastroesophageal reflux disease)   . Urinary frequency   . Neurogenic urinary incontinence     since back surgery  . Arthritis     degenerative oateoarthritis    Surgeries: Procedure(s): TOTAL HIP ARTHROPLASTY on 01/07/2012   Consultants:  none  Discharged Condition: Improved  Hospital Course: CHANEL MCADAMS is an 77 y.o. female who was admitted 01/07/2012 for operative treatment ofOsteoarthrosis, unspecified whether generalized or localized, pelvic region and thigh. Patient has severe unremitting pain that affects sleep, daily activities, and work/hobbies. After pre-op clearance the patient was taken to the operating room on 01/07/2012 and underwent  Procedure(s): TOTAL HIP ARTHROPLASTY.    Patient was given perioperative antibiotics: Anti-infectives     Start     Dose/Rate Route Frequency Ordered Stop   01/07/12 1400   ceFAZolin (ANCEF) IVPB 1 g/50 mL premix        1 g 100 mL/hr over 30 Minutes Intravenous Every 6 hours 01/07/12 1150 01/07/12 2003   01/06/12 1412   ceFAZolin (ANCEF) IVPB 2 g/50 mL premix        2 g 100 mL/hr over 30 Minutes Intravenous 60 min pre-op 01/06/12 1412 01/07/12 0756           Patient was given sequential compression devices, early ambulation, and chemoprophylaxis to prevent DVT.  Patient benefited maximally from  hospital stay and there were no complications.    Recent vital signs: Patient Vitals for the past 24 hrs:  BP Temp Temp src Pulse Resp SpO2  01/09/12 0533 134/69 mmHg 98.1 F (36.7 C) Oral 92  16  99 %  01-24-2012 2025 138/77 mmHg 99.3 F (37.4 C) Oral 84  18  99 %  01-24-12 2015 129/66 mmHg 99.4 F (37.4 C) Oral 86  18  -  2012-01-24 1915 118/65 mmHg 99.1 F (37.3 C) Oral 81  18  -  24-Jan-2012 1815 127/63 mmHg 99.2 F (37.3 C) - 86  18  100 %  01-24-2012 1715 110/54 mmHg 98.7 F (37.1 C) - 82  18  -  2012-01-24 1700 114/53 mmHg 98.7 F (37.1 C) - 81  18  -  01/24/12 1615 102/78 mmHg 99.2 F (37.3 C) - 84  18  95 %  2012-01-24 1600 - - - - 18  100 %  01-24-12 1519 120/58 mmHg 97.7 F (36.5 C) - 80  18  100 %  01-24-12 1419 109/56 mmHg 98.8 F (37.1 C) - 79  18  -  2012/01/24 1401 104/44 mmHg 97.4 F (36.3 C) - 86  18  -  01/24/12 1355 106/56 mmHg 97.7 F (36.5 C) - 88  18  -  Jan 24, 2012 1200 - - - - 18  100 %     Recent laboratory studies:  Basename 01/09/12 0610 January 24, 2012 0545  WBC 11.1* 11.3*  HGB 10.1* 8.1*  HCT 30.5* 24.4*  PLT 125* 153  NA -- 141  K -- 3.7  CL -- 108  CO2 -- 26  BUN -- 16  CREATININE -- 0.88  GLUCOSE -- 113*  INR -- --  CALCIUM -- 8.1*     Discharge Medications:     Medication List     As of 01/09/2012 10:14 AM    STOP taking these medications         meloxicam 15 MG tablet   Commonly known as: MOBIC      oxyCODONE-acetaminophen 10-325 MG per tablet   Commonly known as: PERCOCET      TAKE these medications         amLODipine 5 MG tablet   Commonly known as: NORVASC   Take 1 tablet (5 mg total) by mouth daily.      azelastine 137 MCG/SPRAY nasal spray   Commonly known as: ASTELIN   Place 1 spray into the nose 2 (two) times daily. Use in each nostril as directed  Too expensive, $41.00      Benefiber Tabs   Take by mouth.      CALTRATE 600+D 600-400 MG-UNIT per tablet   Generic drug: Calcium Carbonate-Vitamin D   Take 1 tablet by  mouth daily.      DSS 100 MG Caps   Take 100 mg by mouth 2 (two) times daily.      enoxaparin 30 MG/0.3ML injection   Commonly known as: LOVENOX   Inject 0.3 mLs (30 mg total) into the skin every 12 (twelve) hours.      fexofenadine 180 MG tablet   Commonly known as: ALLEGRA   Take 180 mg by mouth daily.      fluticasone 50 MCG/ACT nasal spray   Commonly known as: FLONASE   Place 2 sprays into the nose daily.      hydrochlorothiazide 25 MG tablet   Commonly known as: HYDRODIURIL   Take 1 tablet (25 mg total) by mouth daily.      hydrOXYzine 25 MG tablet   Commonly known as: ATARAX/VISTARIL   Take 1 tablet (25 mg total) by mouth 3 (three) times daily as needed for itching.      losartan 100 MG tablet   Commonly known as: COZAAR   Take 1 tablet (100 mg total) by mouth daily.      methocarbamol 500 MG tablet   Commonly known as: ROBAXIN   Take 1 tablet (500 mg total) by mouth 4 (four) times daily.      metoprolol succinate 50 MG 24 hr tablet   Commonly known as: TOPROL-XL   Take 50 mg by mouth daily. Take with or immediately following a meal.      oxyCODONE-acetaminophen 5-325 MG per tablet   Commonly known as: PERCOCET/ROXICET   1-2 tabs po q4-6hrs prn pain      pantoprazole 40 MG tablet   Commonly known as: PROTONIX   Take 1 tablet (40 mg total) by mouth daily.      simvastatin 20 MG tablet   Commonly known as: ZOCOR   Take 1 tablet (20 mg total) by mouth daily.        Diagnostic Studies: Dg Pelvis Portable  01/07/2012  *RADIOLOGY REPORT*  Clinical Data: Postop for right hip arthroplasty.  PORTABLE PELVIS  Comparison: 09/20/2008  Findings: AP view pelvis.  Interval right hip arthroplasty, without acute hardware complication or periprosthetic fracture.  Moderate osteopenia.  Left femoral head located, with progressive severe osteoarthritis.  Surgical staples overlie the right hip.  IMPRESSION: Expected appearance after right hip arthroplasty.  Severe left hip  osteoarthritis.   Original Report Authenticated By: Consuello Bossier, M.D.    Dg Hip Portable 1 View Right  01/07/2012  *RADIOLOGY REPORT*  Clinical Data: Postop for right hip arthroplasty.  PORTABLE RIGHT HIP - 1 VIEW  Comparison: Pelvic films same date  Findings: Single cross-table lateral view of the right hip. Interval right hip arthroplasty, without acute hardware complication or periprosthetic fracture. Visualization of the proximal portion is slightly degraded due to overlying soft tissues.  IMPRESSION: Expected appearance after right hip arthroplasty.   Original Report Authenticated By: Consuello Bossier, M.D.     Disposition:       Discharge Orders    Future Orders Please Complete By Expires   Diet - low sodium heart healthy      Call MD / Call 911      Comments:   If you experience chest pain or shortness of breath, CALL 911 and be transported to the hospital emergency room.  If you develope a fever above 101 F, pus (white drainage) or increased drainage or redness at the wound, or calf pain, call your surgeon's office.   Constipation Prevention      Comments:   Drink plenty of fluids.  Prune juice may be helpful.  You may use a stool softener, such as Colace (over the counter) 100 mg twice a day.  Use MiraLax (over the counter) for constipation as needed.   Increase activity slowly as tolerated      Follow the hip precautions as taught in Physical Therapy      Change dressing      Comments:   You may change your dressing on Wednesday, then change the dressing daily with sterile 4 x 4 inch gauze dressing and paper tape.  You may clean the incision with alcohol prior to redressing   TED hose      Comments:   Use stockings (TED hose) for 2 weeks on both leg(s).  You may remove them at night for sleeping.      Follow-up Information    Schedule an appointment as soon as possible for a visit with CAFFREY JR,W D, MD. (on 01/20/2012)    Contact information:   [address]  [place] [address] [phone]       Follow up with [facility] (Home Health Physical Therapy)    Contact information:   [phone]          Signed: Pascal Lux 01/09/2012, 10:14 AM

## 2012-01-09 NOTE — Progress Notes (Signed)
Physical Therapy Treatment Patient Details Name: [name] Frank MRN: [id number] DOB: 07-27-1934 Today's Date: 01/09/2012 Time: 1111-1140 PT Time Calculation (min): 29 min  PT Assessment / Plan / Recommendation Comments on Treatment Session  Plan is for d/c home today.  HHPT to continue progress toward mobility goals.    Follow Up Recommendations  Home health PT     Does the patient have the potential to tolerate intense rehabilitation     Barriers to Discharge        Equipment Recommendations  None recommended by PT    Recommendations for Other Services    Frequency 7X/week   Plan Discharge plan remains appropriate    Precautions / Restrictions Precautions Precautions: Posterior Hip Precaution Comments: Reviewed 3/3 hip precautions. Required Braces or Orthoses: Knee Immobilizer - Right (in bed) Knee Immobilizer - Right: Other (comment) (in bed) Restrictions RLE Weight Bearing: Weight bearing as tolerated   Pertinent Vitals/Pain 0/10    Mobility  Bed Mobility Supine to Sit: 4: Min assist Details for Bed Mobility Assistance: verbal cues for sequencing, precautions Transfers Sit to Stand: 4: Min assist;With armrests;From chair/3-in-1 Stand to Sit: 4: Min guard;To chair/3-in-1;With armrests Details for Transfer Assistance: verbal cues for hand placement Ambulation/Gait Ambulation/Gait Assistance: 4: Min guard Ambulation Distance (Feet): 180 Feet Assistive device: Rolling walker Ambulation/Gait Assistance Details: verbal cues for upright posture Gait Pattern: Step-to pattern Gait velocity: decreased Stairs: Yes Stairs Assistance: 4: Min assist Stairs Assistance Details (indicate cue type and reason): verbal cues for sequencing, stair handout provided Stair Management Technique: Two rails;Forwards Number of Stairs: 3     Exercises     PT Diagnosis:    PT Problem List:   PT Treatment Interventions:     PT Goals Acute Rehab PT Goals PT Goal: Supine/Side  to Sit - Progress: Progressing toward goal PT Goal: Sit to Stand - Progress: Progressing toward goal PT Goal: Stand to Sit - Progress: Progressing toward goal PT Goal: Ambulate - Progress: Progressing toward goal PT Goal: Up/Down Stairs - Progress: Progressing toward goal PT Goal: Perform Home Exercise Program - Progress: Progressing toward goal  Visit Information  Last PT Received On: 01/09/12 Assistance Needed: +1    Subjective Data  Subjective: "I am ready to go home." Patient Stated Goal: home   Cognition  Overall Cognitive Status: Appears within functional limits for tasks assessed/performed Arousal/Alertness: Awake/alert Orientation Level: Appears intact for tasks assessed Behavior During Session: [facility] for tasks performed    Balance     End of Session PT - End of Session Equipment Utilized During Treatment: Gait belt Activity Tolerance: Patient tolerated treatment well Patient left: in chair;with call bell/phone within reach Nurse Communication: Mobility status   GP     Ilda Foil 01/09/2012, 12:41 PM  Aida Raider, PT  Office # [phone] Pager [phone]

## 2012-01-10 ENCOUNTER — Encounter (HOSPITAL_COMMUNITY): Payer: Self-pay | Admitting: Orthopedic Surgery

## 2012-02-29 ENCOUNTER — Other Ambulatory Visit: Payer: Self-pay | Admitting: *Deleted

## 2012-02-29 MED ORDER — LOSARTAN POTASSIUM 100 MG PO TABS: 100.0000 mg | ORAL_TABLET | Freq: Every day | ORAL | Status: DC

## 2012-03-17 ENCOUNTER — Other Ambulatory Visit: Payer: Self-pay | Admitting: Family Medicine

## 2012-03-20 NOTE — Telephone Encounter (Signed)
Refill once 

## 2012-03-20 NOTE — Telephone Encounter (Signed)
Hycodan cough syrup last filled 10-05-11, #120 ml with 0 refills

## 2012-03-22 ENCOUNTER — Other Ambulatory Visit: Payer: Self-pay | Admitting: Family Medicine

## 2012-05-05 ENCOUNTER — Ambulatory Visit: Payer: Medicare Other | Admitting: Family Medicine

## 2012-05-08 ENCOUNTER — Encounter: Payer: Self-pay | Admitting: Family Medicine

## 2012-05-08 ENCOUNTER — Ambulatory Visit (INDEPENDENT_AMBULATORY_CARE_PROVIDER_SITE_OTHER): Payer: Medicare Other | Admitting: Family Medicine

## 2012-05-08 VITALS — BP 170/100 | Temp 98.4°F | Wt 134.0 lb

## 2012-05-08 DIAGNOSIS — R634 Abnormal weight loss: Secondary | ICD-10-CM

## 2012-05-08 DIAGNOSIS — R300 Dysuria: Secondary | ICD-10-CM

## 2012-05-08 LAB — POCT URINALYSIS DIPSTICK
Ketones, UA: NEGATIVE
Spec Grav, UA: <=1.005
pH, UA: 8.5

## 2012-05-08 MED ORDER — CEPHALEXIN 500 MG PO CAPS: 500.0000 mg | ORAL_CAPSULE | Freq: Three times a day (TID) | ORAL | Status: DC

## 2012-05-08 NOTE — Patient Instructions (Addendum)
Urinary Tract Infection Urinary tract infections (UTIs) can develop anywhere along your urinary tract. Your urinary tract is your body's drainage system for removing wastes and extra water. Your urinary tract includes two kidneys, two ureters, a bladder, and a urethra. Your kidneys are a pair of bean-shaped organs. Each kidney is about the size of your fist. They are located below your ribs, one on each side of your spine. CAUSES Infections are caused by microbes, which are microscopic organisms, including fungi, viruses, and bacteria. These organisms are so small that they can only be seen through a microscope. Bacteria are the microbes that most commonly cause UTIs. SYMPTOMS  Symptoms of UTIs may vary by age and gender of the patient and by the location of the infection. Symptoms in young women typically include a frequent and intense urge to urinate and a painful, burning feeling in the bladder or urethra during urination. Older women and men are more likely to be tired, shaky, and weak and have muscle aches and abdominal pain. A fever may mean the infection is in your kidneys. Other symptoms of a kidney infection include pain in your back or sides below the ribs, nausea, and vomiting. DIAGNOSIS To diagnose a UTI, your caregiver will ask you about your symptoms. Your caregiver also will ask to provide a urine sample. The urine sample will be tested for bacteria and white blood cells. White blood cells are made by your body to help fight infection. TREATMENT  Typically, UTIs can be treated with medication. Because most UTIs are caused by a bacterial infection, they usually can be treated with the use of antibiotics. The choice of antibiotic and length of treatment depend on your symptoms and the type of bacteria causing your infection. HOME CARE INSTRUCTIONS  If you were prescribed antibiotics, take them exactly as your caregiver instructs you. Finish the medication even if you feel better after you  have only taken some of the medication.  Drink enough water and fluids to keep your urine clear or pale yellow.  Avoid caffeine, tea, and carbonated beverages. They tend to irritate your bladder.  Empty your bladder often. Avoid holding urine for long periods of time.  Empty your bladder before and after sexual intercourse.  After a bowel movement, women should cleanse from front to back. Use each tissue only once. SEEK MEDICAL CARE IF:   You have back pain.  You develop a fever.  Your symptoms do not begin to resolve within 3 days. SEEK IMMEDIATE MEDICAL CARE IF:   You have severe back pain or lower abdominal pain.  You develop chills.  You have nausea or vomiting.  You have continued burning or discomfort with urination. MAKE SURE YOU:   Understand these instructions.  Will watch your condition.  Will get help right away if you are not doing well or get worse. Document Released: 12/09/2004 Document Revised: 08/31/2011 Document Reviewed: 04/09/2011 [employer] Patient Information 2013 [employer]  

## 2012-05-08 NOTE — Progress Notes (Signed)
Subjective:    Patient ID: [name] Frank, female    DOB: 1934/06/02, 78 y.o.   MRN: [id number]  HPI Patient seen for the following acute/subacute problems  One-week history of dysuria. Urine frequency and burning. Denies associated fever or chills. No nausea or vomiting. No back pain. No antibiotic allergies.  Patient had right total hip replacement October 2013. Did extremely well postoperatively and went straight home without rehabilitation. Ambulating with minimal assistance at this point.  Husband is concerned because of weight loss. She is 10 pounds down from last visit here. Patient states appetite decline since surgery. She thinks this is improving but family is not convinced. No depression issues. Patient denies any recent headaches, abdominal pain, stool changes, lymphadenopathy, cough, or any significant night sweats  Colonoscopy last year. Mammography September 2013 normal..  Past Medical History  Diagnosis Date  . Allergy   . Hyperlipidemia   . Hypertension   . Anxiety   . GERD (gastroesophageal reflux disease)   . Urinary frequency   . Neurogenic urinary incontinence     since back surgery  . Arthritis     degenerative oateoarthritis   Past Surgical History  Procedure Laterality Date  . Appendectomy    . Cholecystectomy    . Abdominal hysterectomy    . Back surgery  2000  . Colonoscopy w/ polypectomy    . Breast surgery  1993    biopsy  . Total hip arthroplasty  01/07/2012    Procedure: TOTAL HIP ARTHROPLASTY;  Surgeon: Thera Flake., MD;  Location: [facility] OR;  Service: Orthopedics;  Laterality: Right;    reports that she quit smoking about 28 years ago. Her smoking use included Cigarettes. She has a 5 pack-year smoking history. She does not have any smokeless tobacco history on file. She reports that she does not drink alcohol or use illicit drugs. family history includes Alcohol abuse in an unspecified family member; Arthritis in an unspecified family  member; Cancer in an unspecified family member; Depression in an unspecified family member; Diabetes in her mother; Heart disease in an unspecified family member; Hypertension in an unspecified family member; and Stroke in an unspecified family member. Allergies  Allergen Reactions  . Codeine Sulfate     REACTION: stomach pains  . Tramadol Hcl     REACTION: stomach cramps      Review of Systems  Constitutional: Positive for appetite change and unexpected weight change. Negative for fever, chills and diaphoresis.  HENT: Negative for trouble swallowing.   Respiratory: Negative for cough, shortness of breath and wheezing.   Cardiovascular: Negative for chest pain, palpitations and leg swelling.  Gastrointestinal: Negative for nausea, vomiting, abdominal pain, diarrhea, constipation and blood in stool.  Endocrine: Negative for polydipsia, polyphagia and polyuria.  Genitourinary: Negative for dysuria and hematuria.  Neurological: Negative for weakness and headaches.  Hematological: Negative for adenopathy. Does not bruise/bleed easily.  Psychiatric/Behavioral: Negative for dysphoric mood.       Objective:   Physical Exam  Constitutional: She is oriented to person, place, and time. She appears well-developed and well-nourished. No distress.  HENT:  Mouth/Throat: Oropharynx is clear and moist.  Neck: Neck supple. No thyromegaly present.  Cardiovascular: Normal rate and regular rhythm.   Pulmonary/Chest: Effort normal and breath sounds normal. No respiratory distress. She has no wheezes. She has no rales.  Abdominal: Soft. She exhibits no mass. There is no tenderness.  Musculoskeletal: She exhibits no edema.  Lymphadenopathy:    She has no cervical  adenopathy.  Neurological: She is alert and oriented to person, place, and time.  Skin: No rash noted.  Psychiatric: She has a normal mood and affect. Her behavior is normal.          Assessment & Plan:  #1 dysuria. Probable  uncomplicated cystitis. Urine culture sent. Keflex 500 mg 3 times a day for 7 days #2 weight loss. She does not appear depressed. She's had basic screening tests such as colonoscopy and mammography as above. Obtain screening labs with TSH, CBC, basic metabolic panel, and hepatic panel.  We discussed nutritional supplementation. Reassess weight within one month if all labs normal

## 2012-05-09 LAB — CBC WITH DIFFERENTIAL/PLATELET
Basophils Relative: 0.2 % (ref 0.0–3.0)
Eosinophils Absolute: 0 10*3/uL (ref 0.0–0.7)
Eosinophils Relative: 0.2 % (ref 0.0–5.0)
Hemoglobin: 12.1 g/dL (ref 12.0–15.0)
MCHC: 32.4 g/dL (ref 30.0–36.0)
MCV: 82.2 fl (ref 78.0–100.0)
Monocytes Absolute: 0.5 10*3/uL (ref 0.1–1.0)
Neutro Abs: 9.8 10*3/uL — ABNORMAL HIGH (ref 1.4–7.7)
RBC: 4.53 Mil/uL (ref 3.87–5.11)
WBC: 13.1 10*3/uL — ABNORMAL HIGH (ref 4.5–10.5)

## 2012-05-09 LAB — BASIC METABOLIC PANEL
CO2: 30 mEq/L (ref 19–32)
Chloride: 104 mEq/L (ref 96–112)
Potassium: 3.1 mEq/L — ABNORMAL LOW (ref 3.5–5.1)
Sodium: 143 mEq/L (ref 135–145)

## 2012-05-09 LAB — HEPATIC FUNCTION PANEL
AST: 15 U/L (ref 0–37)
Albumin: 3.8 g/dL (ref 3.5–5.2)
Total Bilirubin: 0.5 mg/dL (ref 0.3–1.2)

## 2012-05-09 LAB — SEDIMENTATION RATE: Sed Rate: 25 mm/hr — ABNORMAL HIGH (ref 0–22)

## 2012-05-09 LAB — TSH: TSH: 0.52 u[IU]/mL (ref 0.35–5.50)

## 2012-05-10 LAB — URINE CULTURE: Colony Count: >=100000

## 2012-05-11 ENCOUNTER — Other Ambulatory Visit: Payer: Self-pay | Admitting: *Deleted

## 2012-05-11 DIAGNOSIS — E876 Hypokalemia: Secondary | ICD-10-CM

## 2012-05-11 MED ORDER — POTASSIUM CHLORIDE CRYS ER 20 MEQ PO TBCR: 20.0000 meq | EXTENDED_RELEASE_TABLET | Freq: Every day | ORAL | Status: DC

## 2012-05-11 NOTE — Progress Notes (Signed)
Quick Note:  Pt informed, lab and Rx will be ordered ______

## 2012-05-15 ENCOUNTER — Other Ambulatory Visit (INDEPENDENT_AMBULATORY_CARE_PROVIDER_SITE_OTHER): Payer: Medicare Other

## 2012-05-15 DIAGNOSIS — E876 Hypokalemia: Secondary | ICD-10-CM

## 2012-05-15 LAB — BASIC METABOLIC PANEL
Chloride: 101 mEq/L (ref 96–112)
Creatinine, Ser: 0.8 mg/dL (ref 0.4–1.2)
Potassium: 3.8 mEq/L (ref 3.5–5.1)
Sodium: 140 mEq/L (ref 135–145)

## 2012-05-16 NOTE — Progress Notes (Signed)
Quick Note:  Pt informed ______ 

## 2012-05-18 ENCOUNTER — Telehealth: Payer: Self-pay | Admitting: Family Medicine

## 2012-05-18 NOTE — Telephone Encounter (Signed)
Patient Information:  Caller Name: Ameila  Phone: [phone]  Patient: [name] Frank, [name] Frank  Gender: Female  DOB: 1934-08-31  Age: 78 Years  PCP: Evelena Peat [facility])  Office Follow Up:  Does the office need to follow up with this patient?: Yes  Instructions For The Office: OFFICE REQUEST  RN Note:  Rn advised a note about her request would be sent to Dr. Caryl Never.  Symptoms  Reason For Call & Symptoms: Today, 05/18/2012 , pt calling wanting to know if she should continue taking  [employer] Stool Softeners  that she  was told to start   ~ 1 month ago.   RN  unable to locate in [facility]   records when she was told to start.  She has noticed that bowel habits are better.    OFFICE FOLLOW UP REQUEST  Reviewed Health History In EMR: Yes  Reviewed Medications In EMR: Yes  Reviewed Allergies In EMR: Yes  Reviewed Surgeries / Procedures: Yes  Date of Onset of Symptoms: 05/18/2012  Guideline(s) Used:  No Protocol Available - Information Only  Disposition Per Guideline:   Discuss with PCP and Callback by Nurse Today  Reason For Disposition Reached:   Nursing judgment  Advice Given:  N/A

## 2012-05-18 NOTE — Telephone Encounter (Signed)
Pt informed OK to take OTC stool softner as needed

## 2012-05-22 ENCOUNTER — Telehealth: Payer: Self-pay | Admitting: Family Medicine

## 2012-05-22 NOTE — Telephone Encounter (Signed)
Pt can no longer afford metoprolol 50 mg. Pt would like a different med due to cost. [facility]

## 2012-05-22 NOTE — Telephone Encounter (Signed)
Metoprolol is generic so I have no idea why this would be costly from her insurance.  She will need to check with her insurance to see what less costly beta blocker would be. Since she is already on generic would not make sense for me to change to another generic without having more information about cost.

## 2012-05-23 NOTE — Telephone Encounter (Signed)
Pt informed and she will do some checking with her pharmacy and insurance co and call us back.

## 2012-06-12 ENCOUNTER — Ambulatory Visit: Payer: Medicare Other | Admitting: Family Medicine

## 2012-06-19 ENCOUNTER — Encounter: Payer: Self-pay | Admitting: Family Medicine

## 2012-06-19 ENCOUNTER — Ambulatory Visit (INDEPENDENT_AMBULATORY_CARE_PROVIDER_SITE_OTHER): Payer: Medicare Other | Admitting: Family Medicine

## 2012-06-19 VITALS — BP 130/88 | Temp 98.1°F | Wt 132.0 lb

## 2012-06-19 DIAGNOSIS — R634 Abnormal weight loss: Secondary | ICD-10-CM

## 2012-06-19 DIAGNOSIS — I10 Essential (primary) hypertension: Secondary | ICD-10-CM

## 2012-06-19 DIAGNOSIS — E876 Hypokalemia: Secondary | ICD-10-CM

## 2012-06-19 LAB — BASIC METABOLIC PANEL
Chloride: 104 mEq/L (ref 96–112)
GFR: 89.81 mL/min (ref >60.00)
Glucose, Bld: 94 mg/dL (ref 70–99)
Potassium: 4.4 mEq/L (ref 3.5–5.1)
Sodium: 141 mEq/L (ref 135–145)

## 2012-06-19 NOTE — Progress Notes (Signed)
Subjective:    Patient ID: [name] Frank, female    DOB: [birth year], 78 y.o.   MRN: [id number]  HPI Patient seen for followup regarding some recent weight loss issues. She's had gradual weight loss since hip replacement several months ago. Appetite is fair. We suspect a least some of her weight loss is due to decrease in muscle mass. She has had fairly extensive physical therapy  Patient denies any abdominal pain, cough, dyspnea, or any change in urine or stool habits No night sweats. Recent lab work mostly unremarkable. Potassium 3.1. Patient apparently never been on potassium replacement.  Dysuria symptoms from last visit resolved on antibiotics  Past Medical History  Diagnosis Date  . Allergy   . Hyperlipidemia   . Hypertension   . Anxiety   . GERD (gastroesophageal reflux disease)   . Urinary frequency   . Neurogenic urinary incontinence     since back surgery  . Arthritis     degenerative oateoarthritis   Past Surgical History  Procedure Laterality Date  . Appendectomy    . Cholecystectomy    . Abdominal hysterectomy    . Back surgery  2000  . Colonoscopy w/ polypectomy    . Breast surgery  1993    biopsy  . Total hip arthroplasty  01/07/2012    Procedure: TOTAL HIP ARTHROPLASTY;  Surgeon: Thera Flake., MD;  Location: [facility] OR;  Service: Orthopedics;  Laterality: Right;    reports that she quit smoking about 28 years ago. Her smoking use included Cigarettes. She has a 5 pack-year smoking history. She does not have any smokeless tobacco history on file. She reports that she does not drink alcohol or use illicit drugs. family history includes Alcohol abuse in an unspecified family member; Arthritis in an unspecified family member; Cancer in an unspecified family member; Depression in an unspecified family member; Diabetes in her mother; Heart disease in an unspecified family member; Hypertension in an unspecified family member; and Stroke in an unspecified family  member. Allergies  Allergen Reactions  . Codeine Sulfate     REACTION: stomach pains  . Tramadol Hcl     REACTION: stomach cramps      Review of Systems  Constitutional: Positive for fatigue. Negative for fever and chills.  HENT: Negative for sore throat, trouble swallowing and voice change.   Respiratory: Negative for cough and shortness of breath.   Cardiovascular: Negative for chest pain and palpitations.  Gastrointestinal: Positive for constipation. Negative for nausea, vomiting, abdominal pain and blood in stool.  Endocrine: Negative for polydipsia and polyuria.  Genitourinary: Negative for dysuria.  Neurological: Negative for syncope and headaches.  Hematological: Negative for adenopathy. Does not bruise/bleed easily.       Objective:   Physical Exam  Constitutional: She appears well-developed and well-nourished.  HENT:  Right Ear: External ear normal.  Left Ear: External ear normal.  Mouth/Throat: Oropharynx is clear and moist.  Neck: Neck supple.  Cardiovascular: Normal rate and regular rhythm.   Pulmonary/Chest: Effort normal and breath sounds normal. No respiratory distress. She has no wheezes. She has no rales.  Musculoskeletal: She exhibits no edema.  Only trace edema ankles bilaterally. No pitting edema  Lymphadenopathy:    She has no cervical adenopathy.          Assessment & Plan:  #1 hypertension. Stable. Continue current medications #2 hypokalemia. Recheck basic metabolic panel #3 weight loss. Mild and appearing to stabilize slightly though she is still down 2 pounds from  last visit. We discussed strategies to try to increase calorie intake. Reassess one month. Consider abdominal and pelvic CT scan at that point if any further weight loss

## 2012-07-09 ENCOUNTER — Other Ambulatory Visit: Payer: Self-pay | Admitting: Family Medicine

## 2012-07-17 ENCOUNTER — Other Ambulatory Visit: Payer: Self-pay | Admitting: Family Medicine

## 2012-07-18 NOTE — Telephone Encounter (Signed)
Cough syrup last filled on 06-19-12

## 2012-07-19 ENCOUNTER — Ambulatory Visit (INDEPENDENT_AMBULATORY_CARE_PROVIDER_SITE_OTHER): Payer: Medicare Other | Admitting: Family Medicine

## 2012-07-19 ENCOUNTER — Ambulatory Visit (INDEPENDENT_AMBULATORY_CARE_PROVIDER_SITE_OTHER)
Admission: RE | Admit: 2012-07-19 | Discharge: 2012-07-19 | Disposition: A | Discharge status: Home / Self Care | Payer: Medicare Other | Source: Ambulatory Visit | Attending: Family Medicine | Admitting: Family Medicine

## 2012-07-19 VITALS — BP 130/82 | Temp 98.3°F

## 2012-07-19 DIAGNOSIS — R634 Abnormal weight loss: Secondary | ICD-10-CM

## 2012-07-19 DIAGNOSIS — R05 Cough: Secondary | ICD-10-CM

## 2012-07-19 NOTE — Progress Notes (Signed)
  Subjective:    Patient ID: [name] Frank, female    DOB: 04/13/1934, 78 y.o.   MRN: [id number]  HPI Followup regarding weight loss. Patient had right total hip replacement back in October 2013. Since then she claims she's had some decreased appetite but she states her appetite is actually improved since last visit. Her weight is essentially the same. Her weight loss had been somewhat steady but slow. Generally feels well. Denies any recent fevers, chills, might sweats, lymphadenopathy, abdominal pain, headaches. She has had some intermittent dry cough which she thinks may be related to allergic postnasal drip. Denies hemoptysis. No dyspnea. 10-pack-year history of smoking many years ago  Past Medical History  Diagnosis Date  . Allergy   . Hyperlipidemia   . Hypertension   . Anxiety   . GERD (gastroesophageal reflux disease)   . Urinary frequency   . Neurogenic urinary incontinence     since back surgery  . Arthritis     degenerative oateoarthritis   Past Surgical History  Procedure Laterality Date  . Appendectomy    . Cholecystectomy    . Abdominal hysterectomy    . Back surgery  2000  . Colonoscopy w/ polypectomy    . Breast surgery  1993    biopsy  . Total hip arthroplasty  01/07/2012    Procedure: TOTAL HIP ARTHROPLASTY;  Surgeon: Thera Flake., MD;  Location: [facility] OR;  Service: Orthopedics;  Laterality: Right;    reports that she quit smoking about 28 years ago. Her smoking use included Cigarettes. She has a 5 pack-year smoking history. She does not have any smokeless tobacco history on file. She reports that she does not drink alcohol or use illicit drugs. family history includes Alcohol abuse in an unspecified family member; Arthritis in an unspecified family member; Cancer in an unspecified family member; Depression in an unspecified family member; Diabetes in her mother; Heart disease in an unspecified family member; Hypertension in an unspecified family member; and  Stroke in an unspecified family member. Allergies  Allergen Reactions  . Codeine Sulfate     REACTION: stomach pains  . Tramadol Hcl     REACTION: stomach cramps      Review of Systems  Constitutional: Negative for fever, chills and unexpected weight change.  HENT: Negative for sore throat and trouble swallowing.   Respiratory: Positive for cough. Negative for shortness of breath and wheezing.   Cardiovascular: Negative for chest pain.  Gastrointestinal: Negative for abdominal pain and blood in stool.  Genitourinary: Negative for dysuria.  Neurological: Negative for dizziness.  Hematological: Negative for adenopathy.       Objective:   Physical Exam  Constitutional: She appears well-developed and well-nourished. No distress.  HENT:  Mouth/Throat: Oropharynx is clear and moist.  Neck: Neck supple.  Cardiovascular: Normal rate and regular rhythm.  Exam reveals no gallop.   Pulmonary/Chest: Effort normal and breath sounds normal. No respiratory distress. She has no wheezes. She has no rales.  Musculoskeletal:  Only trace edema ankles bilaterally  Lymphadenopathy:    She has no cervical adenopathy.  Skin: No rash noted.          Assessment & Plan:  Weight loss. Appears to be stabilizing. She does have some dry cough which may be related to allergic postnasal drip. Go ahead with chest x-ray given her prior smoking status

## 2012-07-19 NOTE — Telephone Encounter (Signed)
Refill once 

## 2012-07-19 NOTE — Progress Notes (Signed)
Quick Note:  Pt informed ______ 

## 2012-08-21 ENCOUNTER — Other Ambulatory Visit: Payer: Self-pay | Admitting: Family Medicine

## 2012-08-21 NOTE — Telephone Encounter (Signed)
Refill once 

## 2012-08-21 NOTE — Telephone Encounter (Signed)
Hycodan cough syrup last filled on 03-17-12, #120 ml with 0 refills

## 2012-08-22 ENCOUNTER — Other Ambulatory Visit: Payer: Self-pay | Admitting: *Deleted

## 2012-08-22 MED ORDER — SIMVASTATIN 20 MG PO TABS: 20.0000 mg | ORAL_TABLET | Freq: Every day | ORAL | Status: DC

## 2012-09-06 ENCOUNTER — Other Ambulatory Visit: Payer: Self-pay | Admitting: Family Medicine

## 2012-09-06 NOTE — Telephone Encounter (Signed)
Rx request to pharmacy/[facility]  

## 2012-09-07 ENCOUNTER — Ambulatory Visit (INDEPENDENT_AMBULATORY_CARE_PROVIDER_SITE_OTHER): Payer: Medicare Other | Admitting: Family Medicine

## 2012-09-07 ENCOUNTER — Encounter: Payer: Self-pay | Admitting: Family Medicine

## 2012-09-07 VITALS — BP 130/78 | HR 66 | Temp 98.2°F | Resp 14 | Wt 126.5 lb

## 2012-09-07 DIAGNOSIS — H6121 Impacted cerumen, right ear: Secondary | ICD-10-CM

## 2012-09-07 DIAGNOSIS — H6120 Impacted cerumen, unspecified ear: Secondary | ICD-10-CM

## 2012-09-07 DIAGNOSIS — E876 Hypokalemia: Secondary | ICD-10-CM

## 2012-09-07 DIAGNOSIS — R634 Abnormal weight loss: Secondary | ICD-10-CM

## 2012-09-07 LAB — CBC WITH DIFFERENTIAL/PLATELET
Eosinophils Relative: 0.6 % (ref 0.0–5.0)
Lymphocytes Relative: 39 % (ref 12.0–46.0)
Monocytes Relative: 4.7 % (ref 3.0–12.0)
Neutrophils Relative %: 55.4 % (ref 43.0–77.0)
Platelets: 224 10*3/uL (ref 150.0–400.0)
WBC: 8.6 10*3/uL (ref 4.5–10.5)

## 2012-09-07 LAB — BASIC METABOLIC PANEL
BUN: 15 mg/dL (ref 6–23)
Creatinine, Ser: 0.7 mg/dL (ref 0.4–1.2)
GFR: 103.01 mL/min (ref >60.00)

## 2012-09-07 LAB — HEPATIC FUNCTION PANEL
ALT: 9 U/L (ref 0–35)
AST: 16 U/L (ref 0–37)
Alkaline Phosphatase: 73 U/L (ref 39–117)
Bilirubin, Direct: 0.1 mg/dL (ref 0.0–0.3)
Total Bilirubin: 0.5 mg/dL (ref 0.3–1.2)

## 2012-09-07 MED ORDER — MIRTAZAPINE 15 MG PO TBDP: 15.0000 mg | ORAL_TABLET | Freq: Every day | ORAL | Status: DC

## 2012-09-07 NOTE — Progress Notes (Signed)
Subjective:    Patient ID: [name] Frank, female    DOB: 08-26-1934, 78 y.o.   MRN: [id number]  HPI Followup regarding her weight loss issues Has gone for several months. She attributes her weight loss to her hip replacement surgery. We explained this would not be typical. She had colonoscopy last year which was normal. Recent chest x-ray no acute findings. Mammogram during the past year normal. She's had previous hysterectomy which was partial hysterectomy.  Patient is still eating regularly. She denies abdominal pain. No headaches. No dyspnea. No cough. No dysuria. No stool changes. No confusion. Denies depression  She complains of some intermittent fullness right ear.  Patient several labs done several months ago sedimentation rate 25 potassium 3.1 white blood count 13.1  Past Medical History  Diagnosis Date  . Allergy   . Hyperlipidemia   . Hypertension   . Anxiety   . GERD (gastroesophageal reflux disease)   . Urinary frequency   . Neurogenic urinary incontinence     since back surgery  . Arthritis     degenerative oateoarthritis   Past Surgical History  Procedure Laterality Date  . Appendectomy    . Cholecystectomy    . Abdominal hysterectomy    . Back surgery  2000  . Colonoscopy w/ polypectomy    . Breast surgery  1993    biopsy  . Total hip arthroplasty  01/07/2012    Procedure: TOTAL HIP ARTHROPLASTY;  Surgeon: Thera Flake., MD;  Location: [facility] OR;  Service: Orthopedics;  Laterality: Right;    reports that she quit smoking about 28 years ago. Her smoking use included Cigarettes. She has a 5 pack-year smoking history. She does not have any smokeless tobacco history on file. She reports that she does not drink alcohol or use illicit drugs. family history includes Alcohol abuse in an unspecified family member; Arthritis in an unspecified family member; Cancer in an unspecified family member; Depression in an unspecified family member; Diabetes in her mother;  Heart disease in an unspecified family member; Hypertension in an unspecified family member; and Stroke in an unspecified family member. Allergies  Allergen Reactions  . Codeine Sulfate     REACTION: stomach pains  . Tramadol Hcl     REACTION: stomach cramps       Review of Systems  Constitutional: Positive for appetite change and unexpected weight change. Negative for fever and chills.  Respiratory: Negative for cough and shortness of breath.   Cardiovascular: Positive for leg swelling. Negative for chest pain and palpitations.  Gastrointestinal: Negative for abdominal pain.  Genitourinary: Negative for dysuria and hematuria.  Neurological: Negative for syncope and headaches.  Hematological: Negative for adenopathy. Does not bruise/bleed easily.  Psychiatric/Behavioral: Negative for confusion and dysphoric mood.       Objective:   Physical Exam  Constitutional: She is oriented to person, place, and time. She appears well-developed and well-nourished.  HENT:  Mouth/Throat: Oropharynx is clear and moist.  Cerumen impaction right ear canal removed with curette  Cardiovascular: Normal rate.   Pulmonary/Chest: Effort normal and breath sounds normal. No respiratory distress. She has no wheezes. She has no rales.  Abdominal: Soft. She exhibits no mass. There is no tenderness.  Musculoskeletal: She exhibits no edema.  Neurological: She is alert and oriented to person, place, and time. No cranial nerve deficit.          Assessment & Plan:  Persistent weight loss. She's had multiple screening tests within the past year normal  including mammogram, colonoscopy, chest x-ray and labs. We discussed possible CT abdomen and pelvis for further evaluation. Obtain labs including CBC, basic metabolic panel, and sed rate. Need to reassess BMP secondary to mild hypokalemia in the past. Discussed trial of Remeron 15 mg each bedtime. Reassess one month Cerumen impaction removed as above with  curette.  TM appears normal.

## 2012-09-14 ENCOUNTER — Other Ambulatory Visit: Payer: Medicare Other

## 2012-09-18 ENCOUNTER — Ambulatory Visit (INDEPENDENT_AMBULATORY_CARE_PROVIDER_SITE_OTHER)
Admission: RE | Admit: 2012-09-18 | Discharge: 2012-09-18 | Disposition: A | Discharge status: Home / Self Care | Payer: Medicare Other | Source: Ambulatory Visit | Attending: Family Medicine | Admitting: Family Medicine

## 2012-09-18 DIAGNOSIS — R634 Abnormal weight loss: Secondary | ICD-10-CM

## 2012-09-18 MED ORDER — IOHEXOL 300 MG/ML  SOLN
100.0000 mL | Freq: Once | INTRAMUSCULAR | Status: AC | PRN
  Administered 2012-09-18: 100 mL via INTRAVENOUS

## 2012-09-30 ENCOUNTER — Other Ambulatory Visit: Payer: Self-pay | Admitting: Family Medicine

## 2012-10-09 ENCOUNTER — Encounter: Payer: Self-pay | Admitting: Family Medicine

## 2012-10-09 ENCOUNTER — Ambulatory Visit (INDEPENDENT_AMBULATORY_CARE_PROVIDER_SITE_OTHER): Payer: Medicare Other | Admitting: Family Medicine

## 2012-10-09 VITALS — BP 136/88 | HR 102 | Temp 98.0°F | Wt 135.0 lb

## 2012-10-09 DIAGNOSIS — R634 Abnormal weight loss: Secondary | ICD-10-CM

## 2012-10-09 NOTE — Progress Notes (Signed)
  Subjective:    Patient ID: [name] Frank, female    DOB: Mar 06, 1935, 78 y.o.   MRN: [id number]  HPI Followup regarding her recent weight loss.  She had extensive workup including CT abdomen and pelvis which were unremarkable. She was started on Remeron and has already gained about 9 pounds since last visit. Husband states her appetite is greatly improved. Overall she feels well. No depression issues. She is sleeping well. Denies any medication side effects.  Past Medical History  Diagnosis Date  . Allergy   . Hyperlipidemia   . Hypertension   . Anxiety   . GERD (gastroesophageal reflux disease)   . Urinary frequency   . Neurogenic urinary incontinence     since back surgery  . Arthritis     degenerative oateoarthritis   Past Surgical History  Procedure Laterality Date  . Appendectomy    . Cholecystectomy    . Abdominal hysterectomy    . Back surgery  2000  . Colonoscopy w/ polypectomy    . Breast surgery  1993    biopsy  . Total hip arthroplasty  01/07/2012    Procedure: TOTAL HIP ARTHROPLASTY;  Surgeon: Thera Flake., MD;  Location: [facility] OR;  Service: Orthopedics;  Laterality: Right;    reports that she quit smoking about 29 years ago. Her smoking use included Cigarettes. She has a 5 pack-year smoking history. She does not have any smokeless tobacco history on file. She reports that she does not drink alcohol or use illicit drugs. family history includes Alcohol abuse in an unspecified family member; Arthritis in an unspecified family member; Cancer in an unspecified family member; Depression in an unspecified family member; Diabetes in her mother; Heart disease in an unspecified family member; Hypertension in an unspecified family member; and Stroke in an unspecified family member. Allergies  Allergen Reactions  . Codeine Sulfate     REACTION: stomach pains  . Tramadol Hcl     REACTION: stomach cramps      Review of Systems  Constitutional: Negative for fever and  appetite change.  Respiratory: Negative for cough and shortness of breath.   Cardiovascular: Negative for chest pain.  Gastrointestinal: Negative for abdominal pain and blood in stool.  Hematological: Negative for adenopathy.       Objective:   Physical Exam  Constitutional: She appears well-developed and well-nourished.  Neck: Neck supple. No thyromegaly present.  Cardiovascular: Normal rate and regular rhythm.   Pulmonary/Chest: Effort normal and breath sounds normal. No respiratory distress. She has no wheezes. She has no rales.  Musculoskeletal: She exhibits no edema.          Assessment & Plan:  Weight loss. Finally stabilized. Recent workup unrevealing of any worrisome call such as malignancy. Continue Remeron 15 mg each bedtime. Reassess 2 months.

## 2012-12-11 ENCOUNTER — Ambulatory Visit (INDEPENDENT_AMBULATORY_CARE_PROVIDER_SITE_OTHER): Payer: Medicare Other | Admitting: Family Medicine

## 2012-12-11 ENCOUNTER — Encounter: Payer: Self-pay | Admitting: Family Medicine

## 2012-12-11 VITALS — BP 130/70 | HR 73 | Temp 97.7°F | Wt 137.0 lb

## 2012-12-11 DIAGNOSIS — E785 Hyperlipidemia, unspecified: Secondary | ICD-10-CM

## 2012-12-11 DIAGNOSIS — I10 Essential (primary) hypertension: Secondary | ICD-10-CM

## 2012-12-11 DIAGNOSIS — Z23 Encounter for immunization: Secondary | ICD-10-CM

## 2012-12-11 DIAGNOSIS — R21 Rash and other nonspecific skin eruption: Secondary | ICD-10-CM

## 2012-12-11 LAB — LIPID PANEL
Cholesterol: 176 mg/dL (ref 0–200)
LDL Cholesterol: 91 mg/dL (ref 0–99)
VLDL: 19 mg/dL (ref 0.0–40.0)

## 2012-12-11 LAB — HEPATIC FUNCTION PANEL
Bilirubin, Direct: 0 mg/dL (ref 0.0–0.3)
Total Protein: 7.3 g/dL (ref 6.0–8.3)

## 2012-12-11 MED ORDER — TRIAMCINOLONE ACETONIDE 0.025 % EX CREA: TOPICAL_CREAM | Freq: Two times a day (BID) | CUTANEOUS | Status: DC

## 2012-12-11 NOTE — Progress Notes (Signed)
  Subjective:    Patient ID: [name] Frank, female    DOB: 1934/06/25, 78 y.o.   MRN: [id number]  HPI Patient seen for the following issues:  Recent weight loss. Engaged in fairly extensive workup which was unrevealing. We started Remeron 15 mg each bedtime and she has had some steady weight gain since then. Her current weight is up about 11 pounds since starting this. Overall feels well.  She has hypertension which has been stable on losartan, HCTZ, Toprol, and amlodipine. No increased peripheral edema. No headaches. No dizziness.  She has new issue of pruritic rash on her left anterior thigh. Erythematous papules. No pustules. No fevers or chills.  Hyperlipidemia on simvastatin. Takes 20 mg once daily. Needs followup labs.  Past Medical History  Diagnosis Date  . Allergy   . Hyperlipidemia   . Hypertension   . Anxiety   . GERD (gastroesophageal reflux disease)   . Urinary frequency   . Neurogenic urinary incontinence     since back surgery  . Arthritis     degenerative oateoarthritis   Past Surgical History  Procedure Laterality Date  . Appendectomy    . Cholecystectomy    . Abdominal hysterectomy    . Back surgery  2000  . Colonoscopy w/ polypectomy    . Breast surgery  1993    biopsy  . Total hip arthroplasty  01/07/2012    Procedure: TOTAL HIP ARTHROPLASTY;  Surgeon: Thera Flake., MD;  Location: [facility] OR;  Service: Orthopedics;  Laterality: Right;    reports that she quit smoking about 29 years ago. Her smoking use included Cigarettes. She has a 5 pack-year smoking history. She does not have any smokeless tobacco history on file. She reports that she does not drink alcohol or use illicit drugs. family history includes Alcohol abuse in an other family member; Arthritis in an other family member; Cancer in an other family member; Depression in an other family member; Diabetes in her mother; Heart disease in an other family member; Hypertension in an other family  member; Stroke in an other family member. Allergies  Allergen Reactions  . Codeine Sulfate     REACTION: stomach pains  . Tramadol Hcl     REACTION: stomach cramps      Review of Systems  Constitutional: Negative for fatigue.  Eyes: Negative for visual disturbance.  Respiratory: Negative for cough, chest tightness, shortness of breath and wheezing.   Cardiovascular: Negative for chest pain, palpitations and leg swelling.  Neurological: Negative for dizziness, seizures, syncope, weakness, light-headedness and headaches.       Objective:   Physical Exam  Constitutional: She is oriented to person, place, and time. She appears well-developed and well-nourished.  Neck: Neck supple. No thyromegaly present.  Cardiovascular: Normal rate and regular rhythm.   Pulmonary/Chest: Effort normal and breath sounds normal. No respiratory distress. She has no wheezes. She has no rales.  Musculoskeletal: She exhibits no edema.  Neurological: She is alert and oriented to person, place, and time.  Skin:  Patient has a few scattered nonspecific erythematous papules left anterior distal thigh. No pustules. Nontender.          Assessment & Plan:  #1 weight loss. Stabilized on Remeron. No further evaluation. She will continue for now and consider tapering off over the next few months #2 hypertension. Stable #3 hyperlipidemia. Check lipid and hepatic panel #4 pruritic rash. Nonspecific follicular. Triamcinolone 0. 25% cream twice daily as needed

## 2013-01-22 ENCOUNTER — Telehealth: Payer: Self-pay | Admitting: Family Medicine

## 2013-01-22 NOTE — Telephone Encounter (Signed)
Pt was seen in  07-2012 for cough. Pt would like a new rx for hydrocodone cough med

## 2013-01-22 NOTE — Telephone Encounter (Signed)
Patient needs to be seen.

## 2013-01-22 NOTE — Telephone Encounter (Signed)
Pt is sch for 01-24-13

## 2013-01-24 ENCOUNTER — Ambulatory Visit (INDEPENDENT_AMBULATORY_CARE_PROVIDER_SITE_OTHER): Payer: Medicare Other | Admitting: Family Medicine

## 2013-01-24 ENCOUNTER — Encounter: Payer: Self-pay | Admitting: Family Medicine

## 2013-01-24 VITALS — BP 136/72 | HR 120 | Temp 97.6°F | Wt 138.0 lb

## 2013-01-24 DIAGNOSIS — R05 Cough: Secondary | ICD-10-CM

## 2013-01-24 MED ORDER — HYDROCODONE-HOMATROPINE 5-1.5 MG/5ML PO SYRP: 5.0000 mL | ORAL_SOLUTION | Freq: Four times a day (QID) | ORAL | Status: DC | PRN

## 2013-01-24 NOTE — Patient Instructions (Signed)
Call for any fever or increased shortness of breath.

## 2013-01-24 NOTE — Progress Notes (Signed)
  Subjective:    Patient ID: [name] Frank, female    DOB: [birth year], 78 y.o.   MRN: [id number]  HPI  Cough off and on for one week.   Dry cough. No fever.  No dyspnea.   Some PND. Nonsmoker Denies any GERD symptoms. No ACE inhibitor use.  Denies any sore throat, fever, chills No exacerbating factors. Alleviated with Hycodan cough syrup and she is requesting refill.  Past Medical History  Diagnosis Date  . Allergy   . Hyperlipidemia   . Hypertension   . Anxiety   . GERD (gastroesophageal reflux disease)   . Urinary frequency   . Neurogenic urinary incontinence     since back surgery  . Arthritis     degenerative oateoarthritis   Past Surgical History  Procedure Laterality Date  . Appendectomy    . Cholecystectomy    . Abdominal hysterectomy    . Back surgery  2000  . Colonoscopy w/ polypectomy    . Breast surgery  1993    biopsy  . Total hip arthroplasty  01/07/2012    Procedure: TOTAL HIP ARTHROPLASTY;  Surgeon: Thera Flake., MD;  Location: [facility] OR;  Service: Orthopedics;  Laterality: Right;    reports that she quit smoking about 29 years ago. Her smoking use included Cigarettes. She has a 5 pack-year smoking history. She does not have any smokeless tobacco history on file. She reports that she does not drink alcohol or use illicit drugs. family history includes Alcohol abuse in an other family member; Arthritis in an other family member; Cancer in an other family member; Depression in an other family member; Diabetes in her mother; Heart disease in an other family member; Hypertension in an other family member; Stroke in an other family member. Allergies  Allergen Reactions  . Codeine Sulfate     REACTION: stomach pains  . Tramadol Hcl     REACTION: stomach cramps     Review of Systems  Constitutional: Negative for fever, chills, appetite change and unexpected weight change.  Respiratory: Positive for cough. Negative for shortness of breath and wheezing.         Objective:   Physical Exam  Constitutional: She appears well-developed and well-nourished.  HENT:  Right Ear: External ear normal.  Left Ear: External ear normal.  Nose: Nose normal.  Mouth/Throat: Oropharynx is clear and moist.  Neck: Neck supple.  Cardiovascular: Normal rate.   Pulmonary/Chest: Effort normal and breath sounds normal. No respiratory distress. She has no wheezes. She has no rales.          Assessment & Plan:  Cough possibly secondary to allergic postnasal drip. 1 refill Hycodan cough syrup for nighttime use as needed. Continue Flonase. Consider over-the-counter antihistamine. Followup promptly for fever or worsening symptoms

## 2013-01-24 NOTE — Progress Notes (Signed)
Pre visit review using our clinic review tool, if applicable. No additional management support is needed unless otherwise documented below in the visit note. 

## 2013-01-26 ENCOUNTER — Telehealth: Payer: Self-pay | Admitting: Family Medicine

## 2013-01-26 NOTE — Telephone Encounter (Signed)
Pt seen wed and rx for HYDROcodone-homatropine (HYCODAN) 5-1.5 MG/5ML syrup was prescribed. But pt states she does not have that rx. pls advise.

## 2013-01-26 NOTE — Telephone Encounter (Signed)
Pt stated that she dropped the RX off at the pharmacy and called to see if it was ready to be filled, pharmacist stated NO. I called the pharmacy the pharmacist stated that the patient never dropped of the RX.

## 2013-03-04 ENCOUNTER — Other Ambulatory Visit: Payer: Self-pay | Admitting: Family Medicine

## 2013-03-05 ENCOUNTER — Other Ambulatory Visit: Payer: Self-pay | Admitting: Family Medicine

## 2013-04-12 ENCOUNTER — Ambulatory Visit (INDEPENDENT_AMBULATORY_CARE_PROVIDER_SITE_OTHER): Payer: Medicare Other | Admitting: Family Medicine

## 2013-04-12 ENCOUNTER — Encounter: Payer: Self-pay | Admitting: Family Medicine

## 2013-04-12 VITALS — BP 132/84 | HR 84 | Temp 97.5°F | Wt 139.0 lb

## 2013-04-12 DIAGNOSIS — R634 Abnormal weight loss: Secondary | ICD-10-CM

## 2013-04-12 DIAGNOSIS — M1610 Unilateral primary osteoarthritis, unspecified hip: Secondary | ICD-10-CM

## 2013-04-12 DIAGNOSIS — L853 Xerosis cutis: Secondary | ICD-10-CM

## 2013-04-12 DIAGNOSIS — M169 Osteoarthritis of hip, unspecified: Secondary | ICD-10-CM

## 2013-04-12 DIAGNOSIS — L258 Unspecified contact dermatitis due to other agents: Secondary | ICD-10-CM

## 2013-04-12 DIAGNOSIS — I10 Essential (primary) hypertension: Secondary | ICD-10-CM

## 2013-04-12 MED ORDER — TRIAMCINOLONE ACETONIDE 0.1 % EX CREA: 1.0000 "application " | TOPICAL_CREAM | Freq: Two times a day (BID) | CUTANEOUS | Status: DC

## 2013-04-12 NOTE — Progress Notes (Signed)
Pre visit review using our clinic review tool, if applicable. No additional management support is needed unless otherwise documented below in the visit note. 

## 2013-04-12 NOTE — Progress Notes (Signed)
   Subjective:    Patient ID: [name] FlavinShirley M Frank, female    DOB: 1934/05/03, 79 y.o.   MRN: [id number]  HPI Patient is seen for medical followup. She has chronic problems of hypertension, hyperlipidemia, degenerative arthritis, urinary urgency. Medications reviewed. Blood pressure been stable. No dizziness. No orthostasis. No chest pains. She has had mild peripheral edema the past but none currently  She complains of itching mostly involving her left forearm and to some extent upper trunk region. She's tried occasional moisturizer creams but not consistently. Her skin is generally fairly dry.  She's had some weight loss vision the past but has responded fairly well to Remeron. She is sleeping well. Her mood is stable. Appetite is good.  Past Medical History  Diagnosis Date  . Allergy   . Hyperlipidemia   . Hypertension   . Anxiety   . GERD (gastroesophageal reflux disease)   . Urinary frequency   . Neurogenic urinary incontinence     since back surgery  . Arthritis     degenerative oateoarthritis   Past Surgical History  Procedure Laterality Date  . Appendectomy    . Cholecystectomy    . Abdominal hysterectomy    . Back surgery  2000  . Colonoscopy w/ polypectomy    . Breast surgery  1993    biopsy  . Total hip arthroplasty  01/07/2012    Procedure: TOTAL HIP ARTHROPLASTY;  Surgeon: Thera FlakeW D Caffrey Jr., MD;  Location: [facility] OR;  Service: Orthopedics;  Laterality: Right;    reports that she quit smoking about 29 years ago. Her smoking use included Cigarettes. She has a 5 pack-year smoking history. She does not have any smokeless tobacco history on file. She reports that she does not drink alcohol or use illicit drugs. family history includes Alcohol abuse in an other family member; Arthritis in an other family member; Cancer in an other family member; Depression in an other family member; Diabetes in her mother; Heart disease in an other family member; Hypertension in an other family  member; Stroke in an other family member. Allergies  Allergen Reactions  . Codeine Sulfate     REACTION: stomach pains  . Tramadol Hcl     REACTION: stomach cramps      Review of Systems  Constitutional: Negative for chills, appetite change, fatigue and unexpected weight change.  Eyes: Negative for visual disturbance.  Respiratory: Negative for cough, chest tightness, shortness of breath and wheezing.   Cardiovascular: Negative for chest pain, palpitations and leg swelling.  Endocrine: Negative for polydipsia and polyuria.  Neurological: Negative for dizziness, seizures, syncope, weakness, light-headedness and headaches.       Objective:   Physical Exam  Constitutional: She appears well-developed and well-nourished.  Cardiovascular: Normal rate and regular rhythm.   Pulmonary/Chest: Effort normal and breath sounds normal. No respiratory distress. She has no wheezes. She has no rales.  Musculoskeletal: She exhibits no edema.  Skin:  She has some generalized dryness of the arms and a few excoriations left elbow region.  Psychiatric: She has a normal mood and affect. Her behavior is normal.          Assessment & Plan:  #1 hypertension. Well controlled at goal. Continue current medications #2 dry skin/ nonspecific eczema. Triamcinolone 0.1% cream twice daily and combine with moisturizer #3 hyperlipidemia. Continue simvastatin. Recheck lipids at followup in 6 months #4 recent weight loss. Stabilized. Continue Remeron. Will consider possible discontinuation in the spring if she is still doing well

## 2013-04-13 ENCOUNTER — Telehealth: Payer: Self-pay | Admitting: Family Medicine

## 2013-04-13 NOTE — Telephone Encounter (Signed)
Relevant patient education mailed to patient.  

## 2013-04-17 ENCOUNTER — Other Ambulatory Visit: Payer: Self-pay | Admitting: Family Medicine

## 2013-07-23 ENCOUNTER — Other Ambulatory Visit: Payer: Self-pay | Admitting: Family Medicine

## 2013-08-17 ENCOUNTER — Encounter: Payer: Self-pay | Admitting: Family Medicine

## 2013-08-17 ENCOUNTER — Ambulatory Visit (INDEPENDENT_AMBULATORY_CARE_PROVIDER_SITE_OTHER): Payer: Medicare Other | Admitting: Family Medicine

## 2013-08-17 VITALS — BP 132/76 | HR 74 | Wt 136.0 lb

## 2013-08-17 DIAGNOSIS — H6120 Impacted cerumen, unspecified ear: Secondary | ICD-10-CM

## 2013-08-17 DIAGNOSIS — K219 Gastro-esophageal reflux disease without esophagitis: Secondary | ICD-10-CM | POA: Insufficient documentation

## 2013-08-17 DIAGNOSIS — Z23 Encounter for immunization: Secondary | ICD-10-CM

## 2013-08-17 DIAGNOSIS — E785 Hyperlipidemia, unspecified: Secondary | ICD-10-CM

## 2013-08-17 DIAGNOSIS — H6122 Impacted cerumen, left ear: Secondary | ICD-10-CM

## 2013-08-17 DIAGNOSIS — I10 Essential (primary) hypertension: Secondary | ICD-10-CM

## 2013-08-17 NOTE — Progress Notes (Signed)
Pre visit review using our clinic review tool, if applicable. No additional management support is needed unless otherwise documented below in the visit note. 

## 2013-08-17 NOTE — Patient Instructions (Signed)
Try leaving off pantoprazole and start back if you have any breakthrough reflux symptoms Check on insurance coverage for shingles vaccine

## 2013-08-17 NOTE — Progress Notes (Signed)
Subjective:    Patient ID: [name] Frank, female    DOB: 1934/06/12, 79 y.o.   MRN: [id number]  HPI Medical followup.  Hypertension. Treated with multidrug regimen including losartan, amlodipine, HCTZ, metoprolol. No recent orthostasis. Compliant with medications. No headaches. No chest pains.  Acute problem of left ear fullness. No drainage. No dizziness. Slightly diminished hearing.  Hyperlipidemia on simvastatin. No myalgias. Lipids were checked last fall and stable. Osteoarthritis with prior hip replacement. Arthritis symptoms are stable. She's had poor appetite and had some weight loss for the past year and workup was unrevealing. Her weight is currently stable and she is taking mirtazapine at night which is helping her appetite.  Past Medical History  Diagnosis Date  . Allergy   . Hyperlipidemia   . Hypertension   . Anxiety   . GERD (gastroesophageal reflux disease)   . Urinary frequency   . Neurogenic urinary incontinence     since back surgery  . Arthritis     degenerative oateoarthritis   Past Surgical History  Procedure Laterality Date  . Appendectomy    . Cholecystectomy    . Abdominal hysterectomy    . Back surgery  2000  . Colonoscopy w/ polypectomy    . Breast surgery  1993    biopsy  . Total hip arthroplasty  01/07/2012    Procedure: TOTAL HIP ARTHROPLASTY;  Surgeon: Thera Flake., MD;  Location: [facility] OR;  Service: Orthopedics;  Laterality: Right;    reports that she quit smoking about 29 years ago. Her smoking use included Cigarettes. She has a 5 pack-year smoking history. She does not have any smokeless tobacco history on file. She reports that she does not drink alcohol or use illicit drugs. family history includes Alcohol abuse in an other family member; Arthritis in an other family member; Cancer in an other family member; Depression in an other family member; Diabetes in her mother; Heart disease in an other family member; Hypertension in an other  family member; Stroke in an other family member. Allergies  Allergen Reactions  . Codeine Sulfate     REACTION: stomach pains  . Tramadol Hcl     REACTION: stomach cramps      Review of Systems  Constitutional: Negative for fatigue.  Eyes: Negative for visual disturbance.  Respiratory: Negative for cough, chest tightness, shortness of breath and wheezing.   Cardiovascular: Negative for chest pain, palpitations and leg swelling.  Endocrine: Negative for polydipsia and polyuria.  Musculoskeletal: Positive for arthralgias.  Neurological: Negative for dizziness, seizures, syncope, weakness, light-headedness and headaches.       Objective:   Physical Exam  Constitutional: She is oriented to person, place, and time. She appears well-developed and well-nourished.  HENT:  Right Ear: External ear normal.  Cerumen impaction left canal  Neck: Neck supple. No thyromegaly present.  Cardiovascular: Normal rate and regular rhythm.   Pulmonary/Chest: Effort normal and breath sounds normal. No respiratory distress. She has no wheezes. She has no rales.  Musculoskeletal: She exhibits no edema.  Neurological: She is alert and oriented to person, place, and time.  Psychiatric: She has a normal mood and affect. Her behavior is normal. Thought content normal.          Assessment & Plan:  #1 hypertension. Well controlled. Check basic metabolic panel #2 history of GERD. Stable. She will trial leaving off Protonix. #3 cerumen impaction left canal. Removed with irrigation #4 health maintenance. Prevnar 13 immunization given. Check on coverage for  shingles vaccine.

## 2013-08-17 NOTE — Addendum Note (Signed)
Addended by: Shelby Dubin E on: 08/17/2013 11:20 AM   Modules accepted: Orders

## 2013-08-25 ENCOUNTER — Other Ambulatory Visit: Payer: Self-pay | Admitting: Family Medicine

## 2013-09-04 ENCOUNTER — Telehealth: Payer: Self-pay | Admitting: Family Medicine

## 2013-09-04 NOTE — Telephone Encounter (Signed)
Pt would like a refill of the HYDROcodone-homatropine (HYCODAN) 5-1.5 MG/5ML syrup Pt states dr Caryl Neverburchette gave her this med for cough one time and she has a cough again.

## 2013-09-05 MED ORDER — HYDROCODONE-HOMATROPINE 5-1.5 MG/5ML PO SYRP: 5.0000 mL | ORAL_SOLUTION | Freq: Four times a day (QID) | ORAL | Status: DC | PRN

## 2013-09-05 NOTE — Telephone Encounter (Signed)
Pt was last seen 08/17/13

## 2013-09-05 NOTE — Telephone Encounter (Signed)
Left message for patient that Rx is ready for pickup  

## 2013-09-05 NOTE — Telephone Encounter (Signed)
Refill once 

## 2013-09-07 ENCOUNTER — Other Ambulatory Visit: Payer: Self-pay | Admitting: Family Medicine

## 2013-11-06 ENCOUNTER — Telehealth: Payer: Self-pay | Admitting: Family Medicine

## 2013-11-06 NOTE — Telephone Encounter (Signed)
Pt states she received a letter stating that her rx hydrochlorothiazide (HYDRODIURIL) 25 MG tablet has been recalled, pt would like to know if does she continue to take the meds.

## 2013-11-07 NOTE — Telephone Encounter (Signed)
I would continue to take what has already been dispensed.

## 2013-11-07 NOTE — Telephone Encounter (Signed)
Pt informed

## 2013-11-20 ENCOUNTER — Ambulatory Visit (INDEPENDENT_AMBULATORY_CARE_PROVIDER_SITE_OTHER): Payer: Medicare Other | Admitting: Family Medicine

## 2013-11-20 ENCOUNTER — Encounter: Payer: Self-pay | Admitting: Family Medicine

## 2013-11-20 VITALS — BP 136/80 | HR 70 | Temp 97.8°F | Wt 127.0 lb

## 2013-11-20 DIAGNOSIS — E785 Hyperlipidemia, unspecified: Secondary | ICD-10-CM

## 2013-11-20 DIAGNOSIS — Z23 Encounter for immunization: Secondary | ICD-10-CM

## 2013-11-20 DIAGNOSIS — I10 Essential (primary) hypertension: Secondary | ICD-10-CM

## 2013-11-20 DIAGNOSIS — R634 Abnormal weight loss: Secondary | ICD-10-CM

## 2013-11-20 DIAGNOSIS — R300 Dysuria: Secondary | ICD-10-CM

## 2013-11-20 DIAGNOSIS — R5381 Other malaise: Secondary | ICD-10-CM

## 2013-11-20 DIAGNOSIS — R5383 Other fatigue: Principal | ICD-10-CM

## 2013-11-20 LAB — CBC WITH DIFFERENTIAL/PLATELET
BASOS PCT: 0.3 % (ref 0.0–3.0)
Basophils Absolute: 0 10*3/uL (ref 0.0–0.1)
EOS ABS: 0.1 10*3/uL (ref 0.0–0.7)
EOS PCT: 1.6 % (ref 0.0–5.0)
HCT: 39.3 % (ref 36.0–46.0)
HEMOGLOBIN: 12.9 g/dL (ref 12.0–15.0)
LYMPHS PCT: 36.1 % (ref 12.0–46.0)
Lymphs Abs: 3.1 10*3/uL (ref 0.7–4.0)
MCHC: 32.7 g/dL (ref 30.0–36.0)
MCV: 83 fl (ref 78.0–100.0)
MONOS PCT: 5.3 % (ref 3.0–12.0)
Monocytes Absolute: 0.5 10*3/uL (ref 0.1–1.0)
NEUTROS ABS: 4.9 10*3/uL (ref 1.4–7.7)
NEUTROS PCT: 56.7 % (ref 43.0–77.0)
Platelets: 198 10*3/uL (ref 150.0–400.0)
RBC: 4.74 Mil/uL (ref 3.87–5.11)
RDW: 15.1 % (ref 11.5–15.5)
WBC: 8.6 10*3/uL (ref 4.0–10.5)

## 2013-11-20 LAB — POCT URINALYSIS DIPSTICK
BILIRUBIN UA: NEGATIVE
Glucose, UA: NEGATIVE
Nitrite, UA: POSITIVE
PH UA: 5.5
RBC UA: NEGATIVE
Spec Grav, UA: 1.02
Urobilinogen, UA: 0.2

## 2013-11-20 MED ORDER — TERBINAFINE HCL 250 MG PO TABS: 250.0000 mg | ORAL_TABLET | Freq: Every day | ORAL | Status: DC

## 2013-11-20 NOTE — Progress Notes (Signed)
Subjective:    Patient ID: [name] Frank, female    DOB: [birth year], 79 y.o.   MRN: [id number]  Toe Pain    Patient seen for several items  Thickened toenails balding especially great toes bilaterally. She has brittle nail changes. Occasional associated pain. She would like to consider treatment options  Hypertension. This has been well controlled with multiple drug regimen. No recent dizziness. No chest pains  Weight loss. She's had some issues over the past couple of years. Her weight seemed to stabilize. She is taking Remeron at night. She states her appetite is fairly good and her weight is slightly down from last visit. Previous lab testing is unremarkable  She complains of general fatigue. Sleeping fairly well. Appetite is good as above. Denies active depression symptoms.  Past Medical History  Diagnosis Date  . Allergy   . Hyperlipidemia   . Hypertension   . Anxiety   . GERD (gastroesophageal reflux disease)   . Urinary frequency   . Neurogenic urinary incontinence     since back surgery  . Arthritis     degenerative oateoarthritis   Past Surgical History  Procedure Laterality Date  . Appendectomy    . Cholecystectomy    . Abdominal hysterectomy    . Back surgery  2000  . Colonoscopy w/ polypectomy    . Breast surgery  1993    biopsy  . Total hip arthroplasty  01/07/2012    Procedure: TOTAL HIP ARTHROPLASTY;  Surgeon: Thera Flake., MD;  Location: [facility] OR;  Service: Orthopedics;  Laterality: Right;    reports that she quit smoking about 30 years ago. Her smoking use included Cigarettes. She has a 5 pack-year smoking history. She does not have any smokeless tobacco history on file. She reports that she does not drink alcohol or use illicit drugs. family history includes Alcohol abuse in an other family member; Arthritis in an other family member; Cancer in an other family member; Depression in an other family member; Diabetes in her mother; Heart disease in an  other family member; Hypertension in an other family member; Stroke in an other family member. Allergies  Allergen Reactions  . Codeine Sulfate     REACTION: stomach pains  . Tramadol Hcl     REACTION: stomach cramps      Review of Systems  Constitutional: Positive for fatigue and unexpected weight change. Negative for fever and appetite change.  Respiratory: Negative for cough and shortness of breath.   Cardiovascular: Negative for chest pain.  Gastrointestinal: Negative for nausea, vomiting, abdominal pain and diarrhea.  Endocrine: Negative for cold intolerance and heat intolerance.  Genitourinary: Negative for hematuria.  Neurological: Negative for headaches.  Hematological: Negative for adenopathy.  Psychiatric/Behavioral: Negative for confusion and dysphoric mood.       Objective:   Physical Exam  Constitutional: She appears well-developed and well-nourished.  HENT:  Right Ear: External ear normal.  Left Ear: External ear normal.  Mouth/Throat: Oropharynx is clear and moist.  Neck: Neck supple.  Cardiovascular: Normal rate and regular rhythm.  Exam reveals no gallop.   Pulmonary/Chest: Effort normal and breath sounds normal. No respiratory distress. She has no wheezes. She has no rales.  Musculoskeletal: She exhibits no edema.  Psychiatric: She has a normal mood and affect. Her behavior is normal.          Assessment & Plan:  #1 hypertension. Stable at goal. Check basic metabolic panel #2 hyperlipidemia. Continue simvastatin. Check lipid and hepatic  panel #3 onychomycosis involving several toenails. We discussed risk and benefits of treatment. Check hepatic panel. If normal, start Lamisil 250 mg once daily for 3 months #4 fatigue and weight loss. Her weight has actually cycled up and down for the past couple of years. She has had recent increase in appetite which is encouraging. Check further lab work including TSH and CBC.  She does not have any current depression  issues.

## 2013-11-20 NOTE — Progress Notes (Signed)
Pre visit review using our clinic review tool, if applicable. No additional management support is needed unless otherwise documented below in the visit note. 

## 2013-11-21 LAB — BASIC METABOLIC PANEL
BUN: 10 mg/dL (ref 6–23)
CALCIUM: 9.3 mg/dL (ref 8.4–10.5)
CHLORIDE: 104 meq/L (ref 96–112)
CO2: 30 meq/L (ref 19–32)
CREATININE: 1 mg/dL (ref 0.4–1.2)
GFR: 68.37 mL/min (ref >60.00)
Glucose, Bld: 137 mg/dL — ABNORMAL HIGH (ref 70–99)
Potassium: 3.6 mEq/L (ref 3.5–5.1)
Sodium: 142 mEq/L (ref 135–145)

## 2013-11-21 LAB — HEPATIC FUNCTION PANEL
ALK PHOS: 68 U/L (ref 39–117)
ALT: 11 U/L (ref 0–35)
AST: 23 U/L (ref 0–37)
Albumin: 3.8 g/dL (ref 3.5–5.2)
BILIRUBIN DIRECT: 0.1 mg/dL (ref 0.0–0.3)
BILIRUBIN TOTAL: 0.5 mg/dL (ref 0.2–1.2)
Total Protein: 6.9 g/dL (ref 6.0–8.3)

## 2013-11-21 LAB — LIPID PANEL
CHOL/HDL RATIO: 3
Cholesterol: 145 mg/dL (ref 0–200)
HDL: 49 mg/dL (ref >39.00)
LDL CALC: 80 mg/dL (ref 0–99)
NONHDL: 96
Triglycerides: 79 mg/dL (ref 0.0–149.0)
VLDL: 15.8 mg/dL (ref 0.0–40.0)

## 2013-11-21 LAB — TSH: TSH: 0.26 u[IU]/mL — ABNORMAL LOW (ref 0.35–4.50)

## 2013-11-22 ENCOUNTER — Other Ambulatory Visit: Payer: Self-pay | Admitting: Family Medicine

## 2013-11-22 DIAGNOSIS — E785 Hyperlipidemia, unspecified: Secondary | ICD-10-CM

## 2013-11-23 LAB — URINE CULTURE: Colony Count: >=100000

## 2014-01-12 ENCOUNTER — Other Ambulatory Visit: Payer: Self-pay | Admitting: Family Medicine

## 2014-02-18 ENCOUNTER — Ambulatory Visit: Payer: Medicare Other | Admitting: Family Medicine

## 2014-02-20 ENCOUNTER — Ambulatory Visit (INDEPENDENT_AMBULATORY_CARE_PROVIDER_SITE_OTHER): Payer: Medicare Other | Admitting: Family Medicine

## 2014-02-20 ENCOUNTER — Encounter: Payer: Self-pay | Admitting: Family Medicine

## 2014-02-20 VITALS — BP 134/88 | HR 90 | Temp 98.0°F | Wt 127.0 lb

## 2014-02-20 DIAGNOSIS — R739 Hyperglycemia, unspecified: Secondary | ICD-10-CM

## 2014-02-20 DIAGNOSIS — I10 Essential (primary) hypertension: Secondary | ICD-10-CM

## 2014-02-20 DIAGNOSIS — E785 Hyperlipidemia, unspecified: Secondary | ICD-10-CM

## 2014-02-20 DIAGNOSIS — R7989 Other specified abnormal findings of blood chemistry: Secondary | ICD-10-CM

## 2014-02-20 DIAGNOSIS — K219 Gastro-esophageal reflux disease without esophagitis: Secondary | ICD-10-CM

## 2014-02-20 LAB — BASIC METABOLIC PANEL
BUN: 9 mg/dL (ref 6–23)
CHLORIDE: 100 meq/L (ref 96–112)
CO2: 32 mEq/L (ref 19–32)
Calcium: 9.4 mg/dL (ref 8.4–10.5)
Creatinine, Ser: 0.8 mg/dL (ref 0.4–1.2)
GFR: 93.45 mL/min (ref >60.00)
GLUCOSE: 99 mg/dL (ref 70–99)
POTASSIUM: 4 meq/L (ref 3.5–5.1)
SODIUM: 139 meq/L (ref 135–145)

## 2014-02-20 LAB — TSH: TSH: 0.43 u[IU]/mL (ref 0.35–4.50)

## 2014-02-20 LAB — HEMOGLOBIN A1C: Hgb A1c MFr Bld: 5.7 % (ref 4.6–6.5)

## 2014-02-20 LAB — T4, FREE: FREE T4: 1.09 ng/dL (ref 0.60–1.60)

## 2014-02-20 NOTE — Progress Notes (Signed)
Subjective:    Patient ID: [name] FlavinShirley M Frohlich, female    DOB: 06/14/1934, 79 y.o.   MRN: [id number]  HPI  patient seen for medical follow-up. She has history of osteoarthritis, GERD , dyslipidemia, hypertension. Check recent lab significant for TSH low at 0.26 and elevated glucose 137.  Brother recently diagnosed with type 2 diabetes. She denies any symptoms of polyuria or polydipsia. Medications reviewed. Compliant with all. She's had difficulties gaining weight over the past couple years but her weight is stable. We had her on Remeron for some time which she stopped on her own several months ago. She is not having depression issues. Appetite she states is excellent. No diarrhea. Occasional palpitations. No chest pains.  Past Medical History  Diagnosis Date  . Allergy   . Hyperlipidemia   . Hypertension   . Anxiety   . GERD (gastroesophageal reflux disease)   . Urinary frequency   . Neurogenic urinary incontinence     since back surgery  . Arthritis     degenerative oateoarthritis   Past Surgical History  Procedure Laterality Date  . Appendectomy    . Cholecystectomy    . Abdominal hysterectomy    . Back surgery  2000  . Colonoscopy w/ polypectomy    . Breast surgery  1993    biopsy  . Total hip arthroplasty  01/07/2012    Procedure: TOTAL HIP ARTHROPLASTY;  Surgeon: Thera FlakeW D Caffrey Jr., MD;  Location: [facility] OR;  Service: Orthopedics;  Laterality: Right;    reports that she quit smoking about 30 years ago. Her smoking use included Cigarettes. She has a 5 pack-year smoking history. She does not have any smokeless tobacco history on file. She reports that she does not drink alcohol or use illicit drugs. family history includes Alcohol abuse in an other family member; Arthritis in an other family member; Cancer in an other family member; Depression in an other family member; Diabetes in her mother; Heart disease in an other family member; Hypertension in an other family member; Stroke in  an other family member. Allergies  Allergen Reactions  . Codeine Sulfate     REACTION: stomach pains  . Tramadol Hcl     REACTION: stomach cramps      Review of Systems  Constitutional: Positive for fatigue.  Eyes: Negative for visual disturbance.  Respiratory: Negative for cough, chest tightness, shortness of breath and wheezing.   Cardiovascular: Negative for chest pain, palpitations and leg swelling.  Gastrointestinal: Positive for constipation. Negative for nausea, vomiting, abdominal pain and diarrhea.  Endocrine: Negative for heat intolerance, polydipsia and polyuria.  Genitourinary: Negative for dysuria.  Neurological: Negative for dizziness, seizures, syncope, weakness, light-headedness and headaches.       Objective:   Physical Exam  Constitutional: She is oriented to person, place, and time. She appears well-developed and well-nourished.  Neck: Neck supple. No thyromegaly present.  Cardiovascular: Normal rate and regular rhythm.  Exam reveals no gallop.   Pulmonary/Chest: Effort normal and breath sounds normal. No respiratory distress. She has no wheezes. She has no rales.  Musculoskeletal: She exhibits no edema.  Neurological: She is alert and oriented to person, place, and time. No cranial nerve deficit.  Psychiatric: She has a normal mood and affect. Her behavior is normal.          Assessment & Plan:   #1 hypertension. Stable and at goal. Continue current medications  #2 recent hyperglycemia 137. She is fasting today. Check repeat glucose along with hemoglobin  A1c  #3 recent abnormal TSH which was low. Repeat TSH along with free T4.

## 2014-02-20 NOTE — Progress Notes (Signed)
Pre visit review using our clinic review tool, if applicable. No additional management support is needed unless otherwise documented below in the visit note. 

## 2014-03-26 ENCOUNTER — Encounter: Payer: Self-pay | Admitting: Family Medicine

## 2014-03-26 ENCOUNTER — Ambulatory Visit (INDEPENDENT_AMBULATORY_CARE_PROVIDER_SITE_OTHER): Payer: Medicare Other | Admitting: Family Medicine

## 2014-03-26 VITALS — BP 128/80 | HR 89 | Temp 97.3°F | Wt 123.0 lb

## 2014-03-26 DIAGNOSIS — L989 Disorder of the skin and subcutaneous tissue, unspecified: Secondary | ICD-10-CM | POA: Diagnosis not present

## 2014-03-26 DIAGNOSIS — L98491 Non-pressure chronic ulcer of skin of other sites limited to breakdown of skin: Secondary | ICD-10-CM

## 2014-03-26 NOTE — Progress Notes (Signed)
Subjective:    Patient ID: [name] Frank, female    DOB: 27-Oct-1934, 80 y.o.   MRN: [id number]  HPI Acute visit. Patient is seen with slightly painful area which she states is close to her buttocks (lower sacral area) and she first noticed this about 2-3 months ago. She has some pain with sitting but not ambulation. No progressive symptoms. She's not had any stool changes. She's had some difficulties over past couple years with weight gain and has had some weight loss which has been relatively stable. Denies any recent hematochezia. No history of decubitus ulcers.  She had colonoscopy July 2013. She had hip replacement surgery back in 2013 states her appetite has never been the same since then   Reviewed with no changes:  Past Medical History  Diagnosis Date  . Allergy   . Hyperlipidemia   . Hypertension   . Anxiety   . GERD (gastroesophageal reflux disease)   . Urinary frequency   . Neurogenic urinary incontinence     since back surgery  . Arthritis     degenerative oateoarthritis   Past Surgical History  Procedure Laterality Date  . Appendectomy    . Cholecystectomy    . Abdominal hysterectomy    . Back surgery  2000  . Colonoscopy w/ polypectomy    . Breast surgery  1993    biopsy  . Total hip arthroplasty  01/07/2012    Procedure: TOTAL HIP ARTHROPLASTY;  Surgeon: Thera Flake., MD;  Location: [facility] OR;  Service: Orthopedics;  Laterality: Right;    reports that she quit smoking about 30 years ago. Her smoking use included Cigarettes. She has a 5 pack-year smoking history. She does not have any smokeless tobacco history on file. She reports that she does not drink alcohol or use illicit drugs. family history includes Alcohol abuse in an other family member; Arthritis in an other family member; Cancer in an other family member; Depression in an other family member; Diabetes in her mother; Heart disease in an other family member; Hypertension in an other family member;  Stroke in an other family member. Allergies  Allergen Reactions  . Codeine Sulfate     REACTION: stomach pains  . Tramadol Hcl     REACTION: stomach cramps      Review of Systems  Constitutional: Negative for chills, appetite change and unexpected weight change.  Respiratory: Negative for shortness of breath.   Cardiovascular: Negative for chest pain.  Gastrointestinal: Negative for nausea, vomiting, abdominal pain and blood in stool.  Genitourinary: Negative for dysuria.  Hematological: Negative for adenopathy.       Objective:   Physical Exam  Constitutional: She appears well-developed and well-nourished.  Cardiovascular: Normal rate and regular rhythm.   Pulmonary/Chest: Effort normal and breath sounds normal. No respiratory distress. She has no wheezes. She has no rales.  Genitourinary:  Digital exam reveals no rectal mass. No evidence for external hemorrhoids  Skin:  Patient has thickened hyperkeratotic almost verrucous appearing type lesion in her lower sacral area which is about 1.5 x 2 cm in dimension. Note the center region of this she has small superficial appearing ulceration.          Assessment & Plan:  Patient has skin lesion lower sacral region of uncertain significance with small superficial ulceration near the center. Although this appears to be somewhat hyperkeratotic and even verrucous in appearance, with ulceration new the center recommend referral to general surgeon for further evaluation and probable  biopsy. Patient agrees

## 2014-03-26 NOTE — Progress Notes (Signed)
Pre visit review using our clinic review tool, if applicable. No additional management support is needed unless otherwise documented below in the visit note. 

## 2014-03-26 NOTE — Patient Instructions (Signed)
We will call you regarding referral to general surgeon Follow-up immediately for any fever or other signs of infection. Keep area clean with soap and water and keep dry

## 2014-04-15 ENCOUNTER — Ambulatory Visit: Payer: Medicare Other | Admitting: Family Medicine

## 2014-04-16 ENCOUNTER — Encounter: Payer: Self-pay | Admitting: Family Medicine

## 2014-04-16 ENCOUNTER — Ambulatory Visit (INDEPENDENT_AMBULATORY_CARE_PROVIDER_SITE_OTHER): Payer: Medicare Other | Admitting: Family Medicine

## 2014-04-16 VITALS — BP 134/82 | HR 90 | Temp 97.8°F | Wt 121.0 lb

## 2014-04-16 DIAGNOSIS — L509 Urticaria, unspecified: Secondary | ICD-10-CM

## 2014-04-16 NOTE — Progress Notes (Signed)
   Subjective:    Patient ID: [name] Frank, female    DOB: 1934-12-18, 80 y.o.   MRN: [id number]  HPI Acute visit for skin rash over the past few days. She's noticed this mostly on her arms and forearms. Pruritic hives. She's not noted any on her lower extremities. She denies recent fevers or chills. No sore throat. No nasal congestive symptoms. No change of soap or detergent. No recent change of medication. No recent antibiotics. No history of food allergies. No angioedema issues. No dyspnea.  Past Medical History  Diagnosis Date  . Allergy   . Hyperlipidemia   . Hypertension   . Anxiety   . GERD (gastroesophageal reflux disease)   . Urinary frequency   . Neurogenic urinary incontinence     since back surgery  . Arthritis     degenerative oateoarthritis   Past Surgical History  Procedure Laterality Date  . Appendectomy    . Cholecystectomy    . Abdominal hysterectomy    . Back surgery  2000  . Colonoscopy w/ polypectomy    . Breast surgery  1993    biopsy  . Total hip arthroplasty  01/07/2012    Procedure: TOTAL HIP ARTHROPLASTY;  Surgeon: Thera FlakeW D Caffrey Jr., MD;  Location: [facility] OR;  Service: Orthopedics;  Laterality: Right;    reports that she quit smoking about 30 years ago. Her smoking use included Cigarettes. She has a 5 pack-year smoking history. She does not have any smokeless tobacco history on file. She reports that she does not drink alcohol or use illicit drugs. family history includes Alcohol abuse in an other family member; Arthritis in an other family member; Cancer in an other family member; Depression in an other family member; Diabetes in her mother; Heart disease in an other family member; Hypertension in an other family member; Stroke in an other family member. Allergies  Allergen Reactions  . Codeine Sulfate     REACTION: stomach pains  . Tramadol Hcl     REACTION: stomach cramps      Review of Systems  Constitutional: Negative for fever and chills.    Respiratory: Negative for cough, shortness of breath and wheezing.   Cardiovascular: Negative for chest pain.  Skin: Positive for rash.       Objective:   Physical Exam  Constitutional: She appears well-developed and well-nourished.  HENT:  Mouth/Throat: Oropharynx is clear and moist.  Neck: Neck supple.  Cardiovascular: Normal rate and regular rhythm.   Pulmonary/Chest: Effort normal and breath sounds normal. No respiratory distress. She has no wheezes. She has no rales.  Lymphadenopathy:    She has no cervical adenopathy.  Skin: Rash noted.  She has scattered urticarial lesions on her forearms and arms and also a few scattered lesions on her back region. These are raised erythematous nontender and very in size from 1/2-3 cm          Assessment & Plan:  Urticaria. Uncertain etiology. Recommend over-the-counter Allegra or Zyrtec and also can add Pepcid or Zantac. We explained these may take several days to resolve. Touch base if these are not resolving or if she has any new lesions

## 2014-04-16 NOTE — Progress Notes (Signed)
Pre visit review using our clinic review tool, if applicable. No additional management support is needed unless otherwise documented below in the visit note. 

## 2014-04-16 NOTE — Patient Instructions (Signed)
Hives Hives are itchy, red, swollen areas of the skin. They can vary in size and location on your body. Hives can come and go for hours or several days (acute hives) or for several weeks (chronic hives). Hives do not spread from person to person (noncontagious). They may get worse with scratching, exercise, and emotional stress. CAUSES   Allergic reaction to food, additives, or drugs.  Infections, including the common cold.  Illness, such as vasculitis, lupus, or thyroid disease.  Exposure to sunlight, heat, or cold.  Exercise.  Stress.  Contact with chemicals. SYMPTOMS   Red or white swollen patches on the skin. The patches may change size, shape, and location quickly and repeatedly.  Itching.  Swelling of the hands, feet, and face. This may occur if hives develop deeper in the skin. DIAGNOSIS  Your caregiver can usually tell what is wrong by performing a physical exam. Skin or blood tests may also be done to determine the cause of your hives. In some cases, the cause cannot be determined. TREATMENT  Mild cases usually get better with medicines such as antihistamines. Severe cases may require an emergency epinephrine injection. If the cause of your hives is known, treatment includes avoiding that trigger.  HOME CARE INSTRUCTIONS   Avoid causes that trigger your hives.  Take antihistamines as directed by your caregiver to reduce the severity of your hives. Non-sedating or low-sedating antihistamines are usually recommended. Do not drive while taking an antihistamine.  Take any other medicines prescribed for itching as directed by your caregiver.  Wear loose-fitting clothing.  Keep all follow-up appointments as directed by your caregiver. SEEK MEDICAL CARE IF:   You have persistent or severe itching that is not relieved with medicine.  You have painful or swollen joints. SEEK IMMEDIATE MEDICAL CARE IF:   You have a fever.  Your tongue or lips are swollen.  You have  trouble breathing or swallowing.  You feel tightness in the throat or chest.  You have abdominal pain. These problems may be the first sign of a life-threatening allergic reaction. Call your local emergency services (911 in [place] MAKE SURE YOU:   Understand these instructions.  Will watch your condition.  Will get help right away if you are not doing well or get worse. Document Released: 03/01/2005 Document Revised: 03/06/2013 Document Reviewed: 05/25/2011 [facility] Patient Information 2015 [employer], [place]. This information is not intended to replace advice given to you by your health care provider. Make sure you discuss any questions you have with your health care provider.  Try to avoid scratching as much as possible Try over the counter Zyrtec or allegra or Claritan Can also try Zantac or Pepcid

## 2014-04-24 ENCOUNTER — Other Ambulatory Visit: Payer: Self-pay | Admitting: Family Medicine

## 2014-05-06 ENCOUNTER — Ambulatory Visit (INDEPENDENT_AMBULATORY_CARE_PROVIDER_SITE_OTHER): Payer: Medicare Other | Admitting: Family Medicine

## 2014-05-06 ENCOUNTER — Encounter: Payer: Self-pay | Admitting: Family Medicine

## 2014-05-06 VITALS — BP 130/78 | HR 85 | Temp 97.9°F | Wt 120.0 lb

## 2014-05-06 DIAGNOSIS — I10 Essential (primary) hypertension: Secondary | ICD-10-CM

## 2014-05-06 DIAGNOSIS — R42 Dizziness and giddiness: Secondary | ICD-10-CM

## 2014-05-06 DIAGNOSIS — R21 Rash and other nonspecific skin eruption: Secondary | ICD-10-CM

## 2014-05-06 NOTE — Progress Notes (Signed)
Subjective:    Patient ID: [name] Frank, female    DOB: Feb 20, 1935, 80 y.o.   MRN: [id number]  HPI Patient here for follow-up with several issues as follows  She complains of dizziness. On further questioning she is complaint of some nonspecific lightheadedness. This is sometimes positional. No syncope. This has developed since last visit. She has not had any vertigo. No focal weakness. No visual changes.  She initially complained of leg cramps but on further questioning denies this. She is complaining of mostly asymptomatic rash upper and lower extremities. She was seen recently with urticaria and took some Zantac and the hives have resolved but she is convinced the Zantac may have caused this current rash. Current rash is nonscaly. Mostly forearms. No recent change of soaps or detergent.  Hypertension treated with several medications. Blood pressure been stable. She has history of poor appetite and difficulty gaining weight for past couple of years. Her weight is relatively stable. She is currently on Remeron.  Denies active depression symptoms  Past Medical History  Diagnosis Date  . Allergy   . Hyperlipidemia   . Hypertension   . Anxiety   . GERD (gastroesophageal reflux disease)   . Urinary frequency   . Neurogenic urinary incontinence     since back surgery  . Arthritis     degenerative oateoarthritis   Past Surgical History  Procedure Laterality Date  . Appendectomy    . Cholecystectomy    . Abdominal hysterectomy    . Back surgery  2000  . Colonoscopy w/ polypectomy    . Breast surgery  1993    biopsy  . Total hip arthroplasty  01/07/2012    Procedure: TOTAL HIP ARTHROPLASTY;  Surgeon: Thera Flake., MD;  Location: [facility] OR;  Service: Orthopedics;  Laterality: Right;    reports that she quit smoking about 30 years ago. Her smoking use included Cigarettes. She has a 5 pack-year smoking history. She does not have any smokeless tobacco history on file. She reports  that she does not drink alcohol or use illicit drugs. family history includes Alcohol abuse in an other family member; Arthritis in an other family member; Cancer in an other family member; Depression in an other family member; Diabetes in her mother; Heart disease in an other family member; Hypertension in an other family member; Stroke in an other family member. Allergies  Allergen Reactions  . Codeine Sulfate     REACTION: stomach pains  . Tramadol Hcl     REACTION: stomach cramps      Review of Systems  Constitutional: Negative for fever and chills.  Respiratory: Negative for cough and shortness of breath.   Cardiovascular: Negative for chest pain.  Gastrointestinal: Negative for nausea, vomiting, abdominal pain and diarrhea.  Genitourinary: Negative for dysuria.  Neurological: Positive for dizziness and light-headedness. Negative for seizures, syncope, speech difficulty and weakness.  Hematological: Negative for adenopathy. Does not bruise/bleed easily.  Psychiatric/Behavioral: Negative for confusion.       Objective:   Physical Exam  Constitutional: She is oriented to person, place, and time. She appears well-developed and well-nourished.  Cardiovascular: Normal rate and regular rhythm.   Pulmonary/Chest: Effort normal and breath sounds normal. No respiratory distress. She has no wheezes. She has no rales.  Musculoskeletal: She exhibits no edema.  Neurological: She is alert and oriented to person, place, and time. No cranial nerve deficit.  Skin: Rash noted.  Patient has scattered rash upper and lower extremities including forearm  and leg bilaterally. She has erythematous somewhat circumferential rash which blanches with pressure with nonscaly center. There is no clear elevation of border  Psychiatric: She has a normal mood and affect.          Assessment & Plan:  #1 lightheadedness. She does not have any confirmed orthostasis. Sitting blood pressure 142/80 and standing  142/80. Her symptoms are very nonspecific. She had CBC back in September with normal hemoglobin and clinically does not appear anemic or dehydrated. Increase hydration  #2 skin rash. Question fading urticarial rash. This is nonscaly and does not appear fungal. Recommend observation and give this another couple weeks.  If not resolving at that point consider dermatology referral  #3 hypertension stable and at goal #4 history of protein calorie malnutrition. We recommend trial of Ensure supplement. Consider trial of Megace

## 2014-05-06 NOTE — Progress Notes (Signed)
Pre visit review using our clinic review tool, if applicable. No additional management support is needed unless otherwise documented below in the visit note. 

## 2014-05-06 NOTE — Patient Instructions (Signed)
Let me know in one to two week if rash not resolving.

## 2014-05-07 ENCOUNTER — Telehealth: Payer: Self-pay | Admitting: Family Medicine

## 2014-05-07 NOTE — Telephone Encounter (Signed)
emmi mailed  °

## 2014-05-24 ENCOUNTER — Ambulatory Visit (INDEPENDENT_AMBULATORY_CARE_PROVIDER_SITE_OTHER): Payer: Medicare Other | Admitting: Family Medicine

## 2014-05-24 ENCOUNTER — Encounter: Payer: Self-pay | Admitting: Family Medicine

## 2014-05-24 VITALS — BP 128/80 | HR 106 | Temp 98.2°F | Wt 120.0 lb

## 2014-05-24 DIAGNOSIS — R634 Abnormal weight loss: Secondary | ICD-10-CM | POA: Diagnosis not present

## 2014-05-24 DIAGNOSIS — R413 Other amnesia: Secondary | ICD-10-CM

## 2014-05-24 LAB — VITAMIN B12: VITAMIN B 12: 245 pg/mL (ref 211–911)

## 2014-05-24 NOTE — Progress Notes (Signed)
Subjective:    Patient ID: [name] Frank, female    DOB: 04/26/1934, 80 y.o.   MRN: [id number]  HPI Patient is seen for medical follow-up. Husband comes in today with concerns that she may have some memory deficits. He has recently noted that she started repeat her self. Also, with things like cooking she is sometimes forgetting whether she's added things when cooking such as salt or sugar. He has noticed this over several months but this is the first time he is brought our attention. She has also had some vague lightheadedness. No syncope. No vertigo. Denies any headache. Recent TSH normal.  She's had some weight loss and poor weight gain for several months and has had fairly extensive workup thus far unrevealing. Weight has been stable over the past couple months. Denies any depression symptoms.  Past Medical History  Diagnosis Date  . Allergy   . Hyperlipidemia   . Hypertension   . Anxiety   . GERD (gastroesophageal reflux disease)   . Urinary frequency   . Neurogenic urinary incontinence     since back surgery  . Arthritis     degenerative oateoarthritis   Past Surgical History  Procedure Laterality Date  . Appendectomy    . Cholecystectomy    . Abdominal hysterectomy    . Back surgery  2000  . Colonoscopy w/ polypectomy    . Breast surgery  1993    biopsy  . Total hip arthroplasty  01/07/2012    Procedure: TOTAL HIP ARTHROPLASTY;  Surgeon: Thera FlakeW D Caffrey Jr., MD;  Location: [facility] OR;  Service: Orthopedics;  Laterality: Right;    reports that she quit smoking about 30 years ago. Her smoking use included Cigarettes. She has a 5 pack-year smoking history. She does not have any smokeless tobacco history on file. She reports that she does not drink alcohol or use illicit drugs. family history includes Alcohol abuse in an other family member; Arthritis in an other family member; Cancer in an other family member; Depression in an other family member; Diabetes in her mother; Heart  disease in an other family member; Hypertension in an other family member; Stroke in an other family member. Allergies  Allergen Reactions  . Codeine Sulfate     REACTION: stomach pains  . Tramadol Hcl     REACTION: stomach cramps      Review of Systems  Constitutional: Negative for fatigue.  Eyes: Negative for visual disturbance.  Respiratory: Negative for cough, chest tightness, shortness of breath and wheezing.   Cardiovascular: Negative for chest pain, palpitations and leg swelling.  Neurological: Negative for dizziness, seizures, syncope, weakness, light-headedness and headaches.  Psychiatric/Behavioral: Positive for confusion. Negative for dysphoric mood and agitation.       Objective:   Physical Exam  Constitutional: She is oriented to person, place, and time. She appears well-developed and well-nourished.  Cardiovascular: Normal rate and regular rhythm.   Pulmonary/Chest: Effort normal and breath sounds normal. No respiratory distress. She has no wheezes. She has no rales.  Neurological: She is alert and oriented to person, place, and time. No cranial nerve deficit.  No focal weakness. Gait normal. She ambulates with cane.  Psychiatric:  Mini-Mental status exam 23 out of 30          Assessment & Plan:  Patient seen with short-term memory deficits. She has some definite cognitive impairment with 23 out of 30 on MMSE. Recent TSH normal. Check B12. In view of her recent weight loss and  poor appetite check CT head without contrast. If the above is normal consider trial of Namenda or Aricept.

## 2014-05-24 NOTE — Progress Notes (Signed)
Pre visit review using our clinic review tool, if applicable. No additional management support is needed unless otherwise documented below in the visit note. 

## 2014-05-27 ENCOUNTER — Ambulatory Visit (INDEPENDENT_AMBULATORY_CARE_PROVIDER_SITE_OTHER)
Admission: RE | Admit: 2014-05-27 | Discharge: 2014-05-27 | Disposition: A | Discharge status: Home / Self Care | Payer: Medicare Other | Source: Ambulatory Visit | Attending: Family Medicine | Admitting: Family Medicine

## 2014-05-27 DIAGNOSIS — R413 Other amnesia: Secondary | ICD-10-CM

## 2014-05-27 DIAGNOSIS — R634 Abnormal weight loss: Secondary | ICD-10-CM

## 2014-05-27 DIAGNOSIS — R42 Dizziness and giddiness: Secondary | ICD-10-CM | POA: Diagnosis not present

## 2014-05-28 ENCOUNTER — Other Ambulatory Visit: Payer: Self-pay

## 2014-05-29 ENCOUNTER — Other Ambulatory Visit: Payer: Self-pay

## 2014-05-29 MED ORDER — MEMANTINE HCL 28 X 5 MG & 21 X 10 MG PO TABS: 5.0000 mg | ORAL_TABLET | ORAL | Status: DC

## 2014-06-17 ENCOUNTER — Telehealth: Payer: Self-pay | Admitting: Family Medicine

## 2014-06-17 NOTE — Telephone Encounter (Signed)
Pt had an episode Sat am and got up to go to the eye dr .( pt did not have an eye appt.) pt has been doing things which are out of character. Husband is concerned it is the medicine she is on, and does not know which med.  Husband would like an appt to come in w/ pt and sit down to discuss. As soon as possible. Daughter in law wants to come in also.   Husband doesn't want to leave pt alone until they meet w/ the doctor. No appts except Same day on tues. pls advise if ok to schedule.

## 2014-06-17 NOTE — Telephone Encounter (Signed)
Is okay to use a 15 min slot or 30 min slot

## 2014-06-17 NOTE — Telephone Encounter (Signed)
Will need 30 minutes.

## 2014-06-17 NOTE — Telephone Encounter (Signed)
Pt has been scheduled.  °

## 2014-06-19 ENCOUNTER — Encounter: Payer: Self-pay | Admitting: Family Medicine

## 2014-06-19 ENCOUNTER — Ambulatory Visit (INDEPENDENT_AMBULATORY_CARE_PROVIDER_SITE_OTHER): Payer: Medicare Other | Admitting: Family Medicine

## 2014-06-19 VITALS — BP 128/90 | HR 74 | Temp 98.2°F | Wt 118.0 lb

## 2014-06-19 DIAGNOSIS — R4189 Other symptoms and signs involving cognitive functions and awareness: Secondary | ICD-10-CM | POA: Diagnosis not present

## 2014-06-19 DIAGNOSIS — R21 Rash and other nonspecific skin eruption: Secondary | ICD-10-CM | POA: Diagnosis not present

## 2014-06-19 DIAGNOSIS — E46 Unspecified protein-calorie malnutrition: Secondary | ICD-10-CM | POA: Diagnosis not present

## 2014-06-19 MED ORDER — DONEPEZIL HCL 5 MG PO TABS: 5.0000 mg | ORAL_TABLET | Freq: Every day | ORAL | Status: DC

## 2014-06-19 MED ORDER — TRIAMCINOLONE ACETONIDE 0.1 % EX CREA
1.0000 | TOPICAL_CREAM | Freq: Two times a day (BID) | CUTANEOUS | Status: DC
Start: 2014-06-19 — End: 2014-11-16

## 2014-06-19 MED ORDER — MEGESTROL ACETATE 40 MG/ML PO SUSP: ORAL | Status: DC

## 2014-06-19 NOTE — Progress Notes (Signed)
Subjective:    Patient ID: [name] FlavinShirley M Frank, female    DOB: 1934/10/26, 80 y.o.   MRN: [id number]  HPI   Patient here regarding memory problems and ongoing weight loss and decreased appetite. Refer to recent note. MMSE 23 out of 30. We suggested starting Namenda but there apparently was a cost issue even with generic. Family would like to consider generic Aricept. She is starting to have some occasional wandering behaviors at night.   Ongoing weight loss really now for couple years. Previous CT scans unremarkable. Lab work unrevealing. Recent CT head no acute findings. We started Remeron and initially seemed to have some stabilization of weight but never really gained much and she has now had some ongoing losses. We discussed briefly previous possible consideration of Megace. Family would like to consider Meals on Wheels and asking for written order for this. Daughter-in-law states eats fairly well with her husband does not do much cooking. Her appetite is somewhat variable   She's had intermittent pruritic rash arms and legs. More urticarial. Symptoms are worse at night. No recent change of soaps or detergents.  Past Medical History  Diagnosis Date  . Allergy   . Hyperlipidemia   . Hypertension   . Anxiety   . GERD (gastroesophageal reflux disease)   . Urinary frequency   . Neurogenic urinary incontinence     since back surgery  . Arthritis     degenerative oateoarthritis   Past Surgical History  Procedure Laterality Date  . Appendectomy    . Cholecystectomy    . Abdominal hysterectomy    . Back surgery  2000  . Colonoscopy w/ polypectomy    . Breast surgery  1993    biopsy  . Total hip arthroplasty  01/07/2012    Procedure: TOTAL HIP ARTHROPLASTY;  Surgeon: Thera FlakeW D Caffrey Jr., MD;  Location: [facility] OR;  Service: Orthopedics;  Laterality: Right;    reports that she quit smoking about 30 years ago. Her smoking use included Cigarettes. She has a 5 pack-year smoking history. She does  not have any smokeless tobacco history on file. She reports that she does not drink alcohol or use illicit drugs. family history includes Alcohol abuse in an other family member; Arthritis in an other family member; Cancer in an other family member; Depression in an other family member; Diabetes in her mother; Heart disease in an other family member; Hypertension in an other family member; Stroke in an other family member. Allergies  Allergen Reactions  . Codeine Sulfate     REACTION: stomach pains  . Tramadol Hcl     REACTION: stomach cramps      Review of Systems  Constitutional: Positive for appetite change and unexpected weight change. Negative for fever and chills.  Respiratory: Negative for cough and shortness of breath.   Cardiovascular: Negative for chest pain.  Gastrointestinal: Negative for nausea, vomiting, abdominal pain, diarrhea and constipation.  Skin: Positive for rash.  Neurological: Negative for headaches.  Psychiatric/Behavioral: Positive for confusion and sleep disturbance. Negative for hallucinations and agitation.       Objective:   Physical Exam  Constitutional: She appears well-developed and well-nourished.  HENT:  Mouth/Throat: Oropharynx is clear and moist.  Neck: Neck supple. No thyromegaly present.  Cardiovascular: Normal rate and regular rhythm.   Pulmonary/Chest: Effort normal and breath sounds normal. No respiratory distress. She has no wheezes. She has no rales.  Musculoskeletal: She exhibits no edema.  Neurological: She is alert.  Assessment & Plan:   #1 cognitive  Impairment and probable Alzheimer's dementia. Recent Mini-Mental status exam 23 out of 30. Start Aricept 5 mg once daily. Reassess in one month and consider titration at that point  #2 weight loss with protein calorie malnutrition. Question of how much of this is social. Husband is not cooking much. We have suggested Meals on Wheels. Discontinue Remeron which does not seem  to have much impact. Start Megace 40 mg for 5 mL 1 teaspoon twice daily. Reassess weight in one month  #3 skin rash.  Rash appears to be somewhat more urticarial. She'll he has a few faint lesions. Triamcinolone 0.1% cream twice daily. Avoid sedating antihistamines

## 2014-06-19 NOTE — Patient Instructions (Signed)
Stop the Remeron (Mirtazepine) Start the Megace (for appetite) one tsp by mouth twice daily

## 2014-06-19 NOTE — Progress Notes (Signed)
Pre visit review using our clinic review tool, if applicable. No additional management support is needed unless otherwise documented below in the visit note. 

## 2014-06-27 ENCOUNTER — Other Ambulatory Visit: Payer: Self-pay | Admitting: Family Medicine

## 2014-07-03 ENCOUNTER — Ambulatory Visit (INDEPENDENT_AMBULATORY_CARE_PROVIDER_SITE_OTHER): Payer: Medicare Other | Admitting: Family Medicine

## 2014-07-03 ENCOUNTER — Encounter: Payer: Self-pay | Admitting: Family Medicine

## 2014-07-03 VITALS — BP 132/80 | HR 88 | Temp 98.5°F | Wt 113.0 lb

## 2014-07-03 DIAGNOSIS — R634 Abnormal weight loss: Secondary | ICD-10-CM

## 2014-07-03 DIAGNOSIS — E46 Unspecified protein-calorie malnutrition: Secondary | ICD-10-CM

## 2014-07-03 NOTE — Progress Notes (Signed)
Subjective:    Patient ID: [name] Frank, female    DOB: 1934-03-18, 80 y.o.   MRN: [id number]  HPI Patient is accompanied today by her husband and daughter with some intermittent dizziness. Nonspecific lightheadedness. No syncope. She's had progressive weight loss over several months. She has had multiple scans which of all been unremarkable. She does have probable dementia and we recently started low-dose Aricept. We also started Megace for protein calorie malnutrition.  They have not yet set up meals on wheels.  They deny any depression symptoms otherwise.  We tried Remeron which did not seem to help.  Her chronic problems include hyperlipidemia, hypertension, degenerative arthritis, history of GERD.  She lives with her husband. They state that she generally eats fairly well when food is put in front of her but apparently daughter thinks that husband is asking her whether she is hungry and she is saying no and is not offering food certain meals. She has unfortunately lost another 5 pounds. No diarrhea. No nausea or vomiting. No abdominal pain.  Past Medical History  Diagnosis Date  . Allergy   . Hyperlipidemia   . Hypertension   . Anxiety   . GERD (gastroesophageal reflux disease)   . Urinary frequency   . Neurogenic urinary incontinence     since back surgery  . Arthritis     degenerative oateoarthritis   Past Surgical History  Procedure Laterality Date  . Appendectomy    . Cholecystectomy    . Abdominal hysterectomy    . Back surgery  2000  . Colonoscopy w/ polypectomy    . Breast surgery  1993    biopsy  . Total hip arthroplasty  01/07/2012    Procedure: TOTAL HIP ARTHROPLASTY;  Surgeon: Thera Flake., MD;  Location: [facility] OR;  Service: Orthopedics;  Laterality: Right;    reports that she quit smoking about 30 years ago. Her smoking use included Cigarettes. She has a 5 pack-year smoking history. She does not have any smokeless tobacco history on file. She reports that  she does not drink alcohol or use illicit drugs. family history includes Alcohol abuse in an other family member; Arthritis in an other family member; Cancer in an other family member; Depression in an other family member; Diabetes in her mother; Heart disease in an other family member; Hypertension in an other family member; Stroke in an other family member. Allergies  Allergen Reactions  . Codeine Sulfate     REACTION: stomach pains  . Tramadol Hcl     REACTION: stomach cramps      Review of Systems  Constitutional: Positive for fatigue.  Eyes: Negative for visual disturbance.  Respiratory: Negative for cough, chest tightness, shortness of breath and wheezing.   Cardiovascular: Negative for chest pain, palpitations and leg swelling.  Gastrointestinal: Negative for nausea, vomiting, abdominal pain and diarrhea.  Genitourinary: Negative for dysuria.  Neurological: Positive for dizziness. Negative for seizures, syncope, light-headedness and headaches.       Objective:   Physical Exam  Constitutional:  Alert thin elderly female in no distress  HENT:  Mouth/Throat: Oropharynx is clear and moist.  Neck: Neck supple. No thyromegaly present.  Cardiovascular: Normal rate and regular rhythm.   Pulmonary/Chest: Effort normal and breath sounds normal. No respiratory distress. She has no wheezes. She has no rales.  Musculoskeletal: She exhibits no edema.  Lymphadenopathy:    She has no cervical adenopathy.  Neurological: She is alert.  Assessment & Plan:  Patient's had progressive weight loss and protein calorie malnutrition. We think some of this may be social.  Her husband generally takes great care of her but is apparently inquiring whether she is hungry and she is saying no and he is not feeding her several meals. Daughter states that once food is placed in front of her she consistently eats well. We have strongly advised that they establish consistency with feeding her at  least 3-4 meals per day and also discussed supplements such as ensure. Continue Megace. Simplify medications with discontinued Protonix, simvastatin, K-Lor, HCTZ. Check labs with CBC and basic metabolic panel

## 2014-07-03 NOTE — Patient Instructions (Signed)
STOP the Pantoprazole, HCTZ, K-lor, and the Simvastatin.

## 2014-07-03 NOTE — Progress Notes (Signed)
Pre visit review using our clinic review tool, if applicable. No additional management support is needed unless otherwise documented below in the visit note. 

## 2014-07-04 LAB — CBC WITH DIFFERENTIAL/PLATELET
BASOS PCT: 0.6 % (ref 0.0–3.0)
Basophils Absolute: 0.1 10*3/uL (ref 0.0–0.1)
Eosinophils Absolute: 0 10*3/uL (ref 0.0–0.7)
Eosinophils Relative: 0.4 % (ref 0.0–5.0)
HCT: 43.4 % (ref 36.0–46.0)
HEMOGLOBIN: 14.4 g/dL (ref 12.0–15.0)
LYMPHS PCT: 16.6 % (ref 12.0–46.0)
Lymphs Abs: 2 10*3/uL (ref 0.7–4.0)
MCHC: 33.3 g/dL (ref 30.0–36.0)
MCV: 81.9 fl (ref 78.0–100.0)
MONOS PCT: 1.7 % — AB (ref 3.0–12.0)
Monocytes Absolute: 0.2 10*3/uL (ref 0.1–1.0)
NEUTROS ABS: 9.7 10*3/uL — AB (ref 1.4–7.7)
Neutrophils Relative %: 80.7 % — ABNORMAL HIGH (ref 43.0–77.0)
Platelets: 284 10*3/uL (ref 150.0–400.0)
RBC: 5.3 Mil/uL — ABNORMAL HIGH (ref 3.87–5.11)
RDW: 16.2 % — ABNORMAL HIGH (ref 11.5–15.5)
WBC: 12.1 10*3/uL — ABNORMAL HIGH (ref 4.0–10.5)

## 2014-07-04 LAB — HEPATIC FUNCTION PANEL
ALBUMIN: 4.6 g/dL (ref 3.5–5.2)
ALT: 8 U/L (ref 0–35)
AST: 16 U/L (ref 0–37)
Alkaline Phosphatase: 65 U/L (ref 39–117)
Bilirubin, Direct: 0.1 mg/dL (ref 0.0–0.3)
TOTAL PROTEIN: 7.8 g/dL (ref 6.0–8.3)
Total Bilirubin: 0.6 mg/dL (ref 0.2–1.2)

## 2014-07-04 LAB — BASIC METABOLIC PANEL
BUN: 15 mg/dL (ref 6–23)
CO2: 26 mEq/L (ref 19–32)
Calcium: 10.3 mg/dL (ref 8.4–10.5)
Chloride: 103 mEq/L (ref 96–112)
Creatinine, Ser: 1.06 mg/dL (ref 0.40–1.20)
GFR: 64.56 mL/min (ref >60.00)
Glucose, Bld: 107 mg/dL — ABNORMAL HIGH (ref 70–99)
POTASSIUM: 4.7 meq/L (ref 3.5–5.1)
SODIUM: 140 meq/L (ref 135–145)

## 2014-07-11 DIAGNOSIS — H2513 Age-related nuclear cataract, bilateral: Secondary | ICD-10-CM | POA: Diagnosis not present

## 2014-07-11 DIAGNOSIS — H40013 Open angle with borderline findings, low risk, bilateral: Secondary | ICD-10-CM | POA: Diagnosis not present

## 2014-07-19 ENCOUNTER — Encounter: Payer: Self-pay | Admitting: Family Medicine

## 2014-07-19 ENCOUNTER — Ambulatory Visit (INDEPENDENT_AMBULATORY_CARE_PROVIDER_SITE_OTHER): Payer: Medicare Other | Admitting: Family Medicine

## 2014-07-19 VITALS — BP 130/82 | HR 103 | Temp 98.4°F | Wt 116.0 lb

## 2014-07-19 DIAGNOSIS — R4189 Other symptoms and signs involving cognitive functions and awareness: Secondary | ICD-10-CM

## 2014-07-19 DIAGNOSIS — E46 Unspecified protein-calorie malnutrition: Secondary | ICD-10-CM | POA: Diagnosis not present

## 2014-07-19 DIAGNOSIS — I10 Essential (primary) hypertension: Secondary | ICD-10-CM | POA: Diagnosis not present

## 2014-07-19 MED ORDER — DONEPEZIL HCL 10 MG PO TABS: 10.0000 mg | ORAL_TABLET | Freq: Every day | ORAL | Status: AC

## 2014-07-19 NOTE — Progress Notes (Signed)
Pre visit review using our clinic review tool, if applicable. No additional management support is needed unless otherwise documented below in the visit note. 

## 2014-07-19 NOTE — Patient Instructions (Addendum)
High Protein, High Calorie Diet A high protein, high calorie diet increases the amount of protein and calories you eat. You may need more protein and calories in your diet because of illness, surgery, injury, weight loss, or having a poor appetite. Eating high protein and high calorie foods can help you gain weight, heal, and recover after illness.  SERVING SIZES Measuring foods and serving sizes helps to make sure you are getting the right amount of food. The list below tells how big or small some common serving sizes are.   1 oz.........4 stacked dice.  3 oz........Marland Kitchen.Deck of cards.  1 tsp.......Marland Kitchen.Tip of little finger.  1 tbs......Marland Kitchen.Marland Kitchen.Thumb.  2 tbs.......Marland Kitchen.Golf ball.   cup......Marland Kitchen.Half of a fist.  1 cup.......Marland Kitchen.A fist. HIGH PROTEIN FOODS Dairy  Whole milk.  Whole milk yogurt.  Powdered milk.  Cheese.  [employer] CorporationCottage Cheese.  Instant breakfast products.  Eggnog. Tips for adding to your diet:  Use whole milk when making hot cereal, puddings, soups, and hot cocoa.  Add powdered milk to baked goods, smoothies, and milkshakes.  Make whole milk yogurt parfaits by adding granola, fruit, or nuts.  Add cheese to sandwiches, pastas, soups, and casseroles.  Add fruit to cottage cheese. Meat   Beef, pork, and poultry.  Fish and seafood.  Peanut butter.  Dried beans.  Eggs. Tips for adding to your diet:  Make meat and cheese omelets.  Add eggs to salads and baked goods.  Add fish and seafood to salads.  Add meat and poultry to casseroles, salads, and soups.  Use peanut butter as a topping for pretzels, celery, crackers, or add it to baked goods.  Use beans in casseroles, dips, and spreads. GENERAL GUIDELINES TO INCREASE CALORIES  Replace calorie-free drinks with calorie-containing drinks, such as milk, fruit juices, regular soda, milkshakes, and hot chocolate.  Try to eat 6 small meals instead of 3 large meals each day.  Keep snacks handy, such as nuts, trail mixes,  dried fruit, and yogurt.  Choose foods with sauces and gravies.  Add dried fruits, honey, and half-and-half to hot or cold cereal.  Add extra fats when possible, such as butter, sour cream, cream cheese, and salad dressings.  Add cheese to foods often.  Consider adding a clear liquid nutritional supplement to your diet. Your caregiver can give you recommendations. HIGH CALORIE FOODS Grain/Starch  Baked goods, such as muffins and quick breads.  Croissants.  Pancakes and waffles. Vegetable   Sauted vegetables in oil.  Fried vegetables.  Salad greens with regular salad dressing or vinegar and oil. Fruit  Dried fruit.  Canned fruit in syrup.  Fruit juice. Fat  Avocado.  Butter or margarine.  Whipped cream.  Mayonnaise.  Salad dressing.  Peanuts and mixed nuts.  Cream cheese and sour cream. Sweets and Dessert  Cake.  Cookies.  Pie.  Ice cream.  Doughnuts and pastries.  Protein and meal replacement bars.  Jam, preserves, and jelly.  Candy bars.  Chocolate.  Chocolate, caramel, or other flavored syrups. Document Released: 03/01/2005 Document Revised: 05/24/2011 Document Reviewed: 12/02/2006 [facility] Patient Information 2015 [employer] [place], [place]. This information is not intended to replace advice given to you by your health care provider. Make sure you discuss any questions you have with your health care provider.  Go ahead and increase the Aricept to 10 mg at night (may take 2 of the 5 mg until gone) and then take ONE of the 10 mg

## 2014-07-19 NOTE — Progress Notes (Signed)
Subjective:    Patient ID: [name] Frank, female    DOB: 02/13/1935, 80 y.o.   MRN: [id number]  HPI  Patient here regarding dementia and weight loss issues. We had started Aricept 5 mg at night. She has tolerated without nausea or other side effects. Her weight is up 3 pounds from last visit. Refer to prior note. Surprisingly, her albumin was normal. Her husband is being more diligent with putting foods in front of her and being more encouraging for her to be regularly. She is apparently not eating large volume with each meal but is snacking frequently.  Electrolytes were all stable. Recent thyroid functions normal. No diarrhea. No vomiting. No abdominal pain. No new symptoms. She is still taking Megace and they think that is helped her appetite somewhat.  We stopped several medications last visit and attempts to simplify including stopping her statin and Protonix, and one blood pressure medication. She has not had any rebound and hypertension. No GERD symptoms.  Past Medical History  Diagnosis Date  . Allergy   . Hyperlipidemia   . Hypertension   . Anxiety   . GERD (gastroesophageal reflux disease)   . Urinary frequency   . Neurogenic urinary incontinence     since back surgery  . Arthritis     degenerative oateoarthritis   Past Surgical History  Procedure Laterality Date  . Appendectomy    . Cholecystectomy    . Abdominal hysterectomy    . Back surgery  2000  . Colonoscopy w/ polypectomy    . Breast surgery  1993    biopsy  . Total hip arthroplasty  01/07/2012    Procedure: TOTAL HIP ARTHROPLASTY;  Surgeon: Thera FlakeW D Caffrey Jr., MD;  Location: [facility] OR;  Service: Orthopedics;  Laterality: Right;    reports that she quit smoking about 30 years ago. Her smoking use included Cigarettes. She has a 5 pack-year smoking history. She does not have any smokeless tobacco history on file. She reports that she does not drink alcohol or use illicit drugs. family history includes Alcohol  abuse in an other family member; Arthritis in an other family member; Cancer in an other family member; Depression in an other family member; Diabetes in her mother; Heart disease in an other family member; Hypertension in an other family member; Stroke in an other family member. Allergies  Allergen Reactions  . Codeine Sulfate     REACTION: stomach pains  . Tramadol Hcl     REACTION: stomach cramps     Review of Systems  Constitutional: Negative for fever, chills, appetite change and unexpected weight change.  HENT: Negative for trouble swallowing.   Respiratory: Negative for cough, shortness of breath and wheezing.   Cardiovascular: Negative for chest pain, palpitations and leg swelling.  Gastrointestinal: Negative for nausea, vomiting, abdominal pain and diarrhea.  Genitourinary: Negative for dysuria.  Hematological: Negative for adenopathy.       Objective:   Physical Exam  Constitutional: She appears well-developed.  HENT:  Mouth/Throat: Oropharynx is clear and moist.  Neck: Neck supple. No thyromegaly present.  Cardiovascular: Normal rate and regular rhythm.   Pulmonary/Chest: Effort normal and breath sounds normal. No respiratory distress. She has no wheezes. She has no rales.  Musculoskeletal: She exhibits no edema.  Neurological: She is alert.          Assessment & Plan:  #1 protein calorie malnutrition. Her weight is up 3 pounds today. Continue Megace. Continue frequent small meals. Handout given on high-protein  high-calorie diet. Reassess 2 months #2 hypertension stable with recent reduction medications #3 cognitive impairment. Suspect dementia Alzheimer's type. Titrate Aricept 10 mg once daily

## 2014-09-03 ENCOUNTER — Encounter: Payer: Self-pay | Admitting: Family Medicine

## 2014-09-03 ENCOUNTER — Ambulatory Visit (INDEPENDENT_AMBULATORY_CARE_PROVIDER_SITE_OTHER): Payer: Medicare Other | Admitting: Family Medicine

## 2014-09-03 VITALS — BP 128/80 | HR 102 | Temp 97.6°F | Wt 113.0 lb

## 2014-09-03 DIAGNOSIS — R600 Localized edema: Secondary | ICD-10-CM

## 2014-09-03 LAB — BASIC METABOLIC PANEL
BUN: 13 mg/dL (ref 6–23)
CO2: 29 mEq/L (ref 19–32)
CREATININE: 0.82 mg/dL (ref 0.40–1.20)
Calcium: 9.7 mg/dL (ref 8.4–10.5)
Chloride: 104 mEq/L (ref 96–112)
GFR: 86.78 mL/min (ref >60.00)
Glucose, Bld: 92 mg/dL (ref 70–99)
Potassium: 4.2 mEq/L (ref 3.5–5.1)
Sodium: 139 mEq/L (ref 135–145)

## 2014-09-03 LAB — HEPATIC FUNCTION PANEL
ALT: 10 U/L (ref 0–35)
AST: 14 U/L (ref 0–37)
Albumin: 4.3 g/dL (ref 3.5–5.2)
Alkaline Phosphatase: 49 U/L (ref 39–117)
BILIRUBIN DIRECT: 0.1 mg/dL (ref 0.0–0.3)
BILIRUBIN TOTAL: 0.5 mg/dL (ref 0.2–1.2)
Total Protein: 7.1 g/dL (ref 6.0–8.3)

## 2014-09-03 LAB — BRAIN NATRIURETIC PEPTIDE: Pro B Natriuretic peptide (BNP): 37 pg/mL (ref 0.0–100.0)

## 2014-09-03 MED ORDER — FUROSEMIDE 20 MG PO TABS
20.0000 mg | ORAL_TABLET | Freq: Every day | ORAL | Status: DC
Start: 2014-09-03 — End: 2014-10-02

## 2014-09-03 NOTE — Progress Notes (Signed)
Pre visit review using our clinic review tool, if applicable. No additional management support is needed unless otherwise documented below in the visit note. 

## 2014-09-03 NOTE — Patient Instructions (Signed)

## 2014-09-03 NOTE — Progress Notes (Signed)
   Subjective:    Patient ID: [name] Frank, female    DOB: 1934-06-07, 80 y.o.   MRN: [id number]  HPI Bilateral ankle and lower leg edema past few days. She denies any orthopnea or dyspnea. No dietary changes. Her weight has continued to decline slightly. Recent thyroid functions normal. No history of chronic kidney disease. She takes amlodipine among other medications at baseline and has been on this for many years. No history of known CHF. Husband has been supplementing with ensure. She is eating at least 3 meals per day but somewhat inconsistent with appetite and intake.  Past Medical History  Diagnosis Date  . Allergy   . Hyperlipidemia   . Hypertension   . Anxiety   . GERD (gastroesophageal reflux disease)   . Urinary frequency   . Neurogenic urinary incontinence     since back surgery  . Arthritis     degenerative oateoarthritis   Past Surgical History  Procedure Laterality Date  . Appendectomy    . Cholecystectomy    . Abdominal hysterectomy    . Back surgery  2000  . Colonoscopy w/ polypectomy    . Breast surgery  1993    biopsy  . Total hip arthroplasty  01/07/2012    Procedure: TOTAL HIP ARTHROPLASTY;  Surgeon: Thera Flake., MD;  Location: [facility] OR;  Service: Orthopedics;  Laterality: Right;    reports that she quit smoking about 30 years ago. Her smoking use included Cigarettes. She has a 5 pack-year smoking history. She does not have any smokeless tobacco history on file. She reports that she does not drink alcohol or use illicit drugs. family history includes Alcohol abuse in an other family member; Arthritis in an other family member; Cancer in an other family member; Depression in an other family member; Diabetes in her mother; Heart disease in an other family member; Hypertension in an other family member; Stroke in an other family member. Allergies  Allergen Reactions  . Codeine Sulfate     REACTION: stomach pains  . Tramadol Hcl     REACTION: stomach  cramps      Review of Systems  Constitutional: Positive for fatigue. Negative for fever, chills and appetite change.  Respiratory: Negative for cough and shortness of breath.   Cardiovascular: Positive for leg swelling. Negative for chest pain and palpitations.  Gastrointestinal: Negative for nausea, vomiting and abdominal pain.  Genitourinary: Negative for dysuria.  Hematological: Negative for adenopathy.  Psychiatric/Behavioral: Negative for agitation.       Objective:   Physical Exam  Constitutional: She appears well-developed and well-nourished.  Neck: Neck supple. No JVD present. No thyromegaly present.  Cardiovascular: Normal rate and regular rhythm.   Pulmonary/Chest: Effort normal and breath sounds normal. No respiratory distress. She has no wheezes. She has no rales.  Musculoskeletal: She exhibits edema.  Neurological: She is alert.  Psychiatric: She has a normal mood and affect.          Assessment & Plan:  Bilateral leg edema. Likely multifactorial-amlodipine, ?diastolic failure, venous stasis, poor nutrition (?low albumin-though recent albumin 4.6). No history of known systolic heart failure. Surprisingly, her weight is actually down a few pounds from last visit. Check basic metabolic panel and BNP. Start low-dose furosemide 20 mg once daily. Elevate legs frequently. Watch sodium intake. Potassium rich foods. Reassess 3 days.

## 2014-09-04 ENCOUNTER — Encounter: Payer: Self-pay | Admitting: *Deleted

## 2014-09-06 ENCOUNTER — Ambulatory Visit (INDEPENDENT_AMBULATORY_CARE_PROVIDER_SITE_OTHER): Payer: Medicare Other | Admitting: Family Medicine

## 2014-09-06 ENCOUNTER — Encounter: Payer: Self-pay | Admitting: Family Medicine

## 2014-09-06 VITALS — BP 130/80 | HR 101 | Temp 98.2°F | Wt 111.0 lb

## 2014-09-06 DIAGNOSIS — R634 Abnormal weight loss: Secondary | ICD-10-CM | POA: Diagnosis not present

## 2014-09-06 DIAGNOSIS — R600 Localized edema: Secondary | ICD-10-CM

## 2014-09-06 NOTE — Progress Notes (Signed)
Subjective:    Patient ID: [name] Frank, female    DOB: 28-May-1934, 80 y.o.   MRN: [id number]  HPI  There for follow-up regarding bilateral leg edema. Refer to recent note. Her BNP levels 37. Electrolytes remain stable. Albumen normal. We started furosemide 20 mg once daily. Her weight is down 2 pounds and edema has all but resolved. Denies any dyspnea or orthopnea. No dietary changes.  Has continued to have weight loss over the past few years. We started Remeron initially which did not seem to help. She's not had any clear-cut depression. She is currently on Megace and family states she has decent appetite and eats low volume with meals. No vomiting. No diarrhea. No signs of malabsorption. She's had multiple studies including labs, CT of head, CT abdomen pelvis unremarkable. No pain She does have underlying dementia and we wondered if a lot of her decline in weight is related to this. She has low motivation. Husband is trying to be more diligent and encouraging her to eat  Past Medical History  Diagnosis Date  . Allergy   . Hyperlipidemia   . Hypertension   . Anxiety   . GERD (gastroesophageal reflux disease)   . Urinary frequency   . Neurogenic urinary incontinence     since back surgery  . Arthritis     degenerative oateoarthritis   Past Surgical History  Procedure Laterality Date  . Appendectomy    . Cholecystectomy    . Abdominal hysterectomy    . Back surgery  2000  . Colonoscopy w/ polypectomy    . Breast surgery  1993    biopsy  . Total hip arthroplasty  01/07/2012    Procedure: TOTAL HIP ARTHROPLASTY;  Surgeon: Thera Flake., MD;  Location: [facility] OR;  Service: Orthopedics;  Laterality: Right;    reports that she quit smoking about 30 years ago. Her smoking use included Cigarettes. She has a 5 pack-year smoking history. She does not have any smokeless tobacco history on file. She reports that she does not drink alcohol or use illicit drugs. family history includes  Alcohol abuse in an other family member; Arthritis in an other family member; Cancer in an other family member; Depression in an other family member; Diabetes in her mother; Heart disease in an other family member; Hypertension in an other family member; Stroke in an other family member. Allergies  Allergen Reactions  . Codeine Sulfate     REACTION: stomach pains  . Tramadol Hcl     REACTION: stomach cramps     Review of Systems  Constitutional: Negative for chills, appetite change, fatigue and unexpected weight change.  Eyes: Negative for visual disturbance.  Respiratory: Negative for cough, chest tightness, shortness of breath and wheezing.   Cardiovascular: Positive for leg swelling. Negative for chest pain and palpitations.  Gastrointestinal: Negative for nausea, vomiting, abdominal pain and diarrhea.  Genitourinary: Negative for dysuria.  Neurological: Negative for dizziness, seizures, syncope, weakness, light-headedness and headaches.       Objective:   Physical Exam  Constitutional: She appears well-developed and well-nourished.  Neck: Neck supple. No JVD present.  Cardiovascular: Normal rate and regular rhythm.  Exam reveals no gallop.   Pulmonary/Chest: Effort normal and breath sounds normal. No respiratory distress. She has no wheezes. She has no rales.  Musculoskeletal:  Only trace nonpitting edema legs bilaterally. Much improved from earlier in the week  Neurological: She is alert.          Assessment &  Plan:  #1 bilateral leg edema. No evidence for systolic failure. Improved with low-dose furosemide. They will treat for one more day and then use as needed. Leg elevation and consider compression garments. #2 gradual chronic weight loss for over couple years. Suspect largely related to underlying dementia. We've offered nutritional consult and they decline. Continue Megace. We've given them several suggestions for ways to try to increase her calorie intake

## 2014-09-06 NOTE — Progress Notes (Signed)
Pre visit review using our clinic review tool, if applicable. No additional management support is needed unless otherwise documented below in the visit note. 

## 2014-09-06 NOTE — Patient Instructions (Signed)
Take the furosemide daily for one more day and then use as needed If leg edema recurs, consider going to one every Monday, Wednesday, and Friday.

## 2014-10-02 ENCOUNTER — Other Ambulatory Visit: Payer: Self-pay | Admitting: Family Medicine

## 2014-10-09 ENCOUNTER — Ambulatory Visit: Payer: Medicare Other | Admitting: Family Medicine

## 2014-10-10 ENCOUNTER — Encounter: Payer: Self-pay | Admitting: Family Medicine

## 2014-10-10 ENCOUNTER — Ambulatory Visit (INDEPENDENT_AMBULATORY_CARE_PROVIDER_SITE_OTHER): Payer: Medicare Other | Admitting: Family Medicine

## 2014-10-10 VITALS — BP 130/80 | HR 90 | Temp 97.6°F | Wt 115.0 lb

## 2014-10-10 DIAGNOSIS — R350 Frequency of micturition: Secondary | ICD-10-CM | POA: Diagnosis not present

## 2014-10-10 DIAGNOSIS — R627 Adult failure to thrive: Secondary | ICD-10-CM | POA: Diagnosis not present

## 2014-10-10 DIAGNOSIS — M25552 Pain in left hip: Secondary | ICD-10-CM | POA: Diagnosis not present

## 2014-10-10 DIAGNOSIS — R319 Hematuria, unspecified: Secondary | ICD-10-CM

## 2014-10-10 LAB — POCT URINALYSIS DIPSTICK
Bilirubin, UA: NEGATIVE
Glucose, UA: NEGATIVE
KETONES UA: NEGATIVE
NITRITE UA: POSITIVE
SPEC GRAV UA: 1.015
Urobilinogen, UA: 0.2
pH, UA: 6

## 2014-10-10 MED ORDER — CIPROFLOXACIN HCL 250 MG PO TABS: 250.0000 mg | ORAL_TABLET | Freq: Two times a day (BID) | ORAL | Status: DC

## 2014-10-10 NOTE — Progress Notes (Signed)
Subjective:    Patient ID: [name] Frank, female    DOB: Mar 02, 1935, 80 y.o.   MRN: [id number]  HPI Patient for follow-up regarding her ongoing weight loss. She seems to have turned the corner and is actually up 4 pounds today. She had recent bilateral leg edema and that is improved following brief use of Lasix. She is taking Megace for appetite stimulation that seems to be helping. Husband states she has a great appetite.  Is complaining of some urine frequency for the past few days. Denies burning. No fevers or chills.  Left hip pain. History of total hip replacement a few years ago. Her pain is actually more iliac crest region. Husband thinks this may be due to her straining to get in and out of their truck. She denies any falls or other injury.  Past Medical History  Diagnosis Date  . Allergy   . Hyperlipidemia   . Hypertension   . Anxiety   . GERD (gastroesophageal reflux disease)   . Urinary frequency   . Neurogenic urinary incontinence     since back surgery  . Arthritis     degenerative oateoarthritis   Past Surgical History  Procedure Laterality Date  . Appendectomy    . Cholecystectomy    . Abdominal hysterectomy    . Back surgery  2000  . Colonoscopy w/ polypectomy    . Breast surgery  1993    biopsy  . Total hip arthroplasty  01/07/2012    Procedure: TOTAL HIP ARTHROPLASTY;  Surgeon: Thera Flake., MD;  Location: [facility] OR;  Service: Orthopedics;  Laterality: Right;    reports that she quit smoking about 31 years ago. Her smoking use included Cigarettes. She has a 5 pack-year smoking history. She does not have any smokeless tobacco history on file. She reports that she does not drink alcohol or use illicit drugs. family history includes Alcohol abuse in an other family member; Arthritis in an other family member; Cancer in an other family member; Depression in an other family member; Diabetes in her mother; Heart disease in an other family member; Hypertension  in an other family member; Stroke in an other family member. Allergies  Allergen Reactions  . Codeine Sulfate     REACTION: stomach pains  . Tramadol Hcl     REACTION: stomach cramps      Review of Systems  Constitutional: Negative for fever and fatigue.  Eyes: Negative for visual disturbance.  Respiratory: Negative for cough, chest tightness, shortness of breath and wheezing.   Cardiovascular: Negative for chest pain, palpitations and leg swelling.  Genitourinary: Positive for frequency. Negative for dysuria.  Neurological: Negative for dizziness, seizures, syncope, weakness, light-headedness and headaches.       Objective:   Physical Exam  Constitutional: She appears well-developed and well-nourished. No distress.  Neck: Neck supple.  Cardiovascular: Normal rate and regular rhythm.   Pulmonary/Chest: Effort normal and breath sounds normal. No respiratory distress. She has no wheezes. She has no rales.  Musculoskeletal: She exhibits no edema.  No reproducible tenderness in palpating left iliac crest. She has fair range of motion left hip  Lymphadenopathy:    She has no cervical adenopathy.  Neurological: She is alert.  Skin: No rash noted.  Psychiatric: She has a normal mood and affect.          Assessment & Plan:  #1 failure to thrive. She's had problems with ongoing weight loss but this seems to have stabilized as her  weight is up 4 pounds over the past month. Continue to monitor. Continue Megace #2 left hip pain. This is more iliac crest in location. Obtain x-rays left hip to assess #3 urine frequency. Rule out infection. Obtain urinalysis

## 2014-10-10 NOTE — Progress Notes (Signed)
Pre visit review using our clinic review tool, if applicable. No additional management support is needed unless otherwise documented below in the visit note. 

## 2014-10-12 LAB — URINE CULTURE: Colony Count: >=100000

## 2014-10-14 ENCOUNTER — Ambulatory Visit (INDEPENDENT_AMBULATORY_CARE_PROVIDER_SITE_OTHER)
Admission: RE | Admit: 2014-10-14 | Discharge: 2014-10-14 | Disposition: A | Discharge status: Home / Self Care | Payer: Medicare Other | Source: Ambulatory Visit | Attending: Family Medicine | Admitting: Family Medicine

## 2014-10-14 ENCOUNTER — Ambulatory Visit: Payer: Medicare Other | Admitting: Family Medicine

## 2014-10-14 ENCOUNTER — Other Ambulatory Visit: Payer: Self-pay | Admitting: Family Medicine

## 2014-10-14 DIAGNOSIS — M25552 Pain in left hip: Secondary | ICD-10-CM

## 2014-10-21 ENCOUNTER — Other Ambulatory Visit: Payer: Self-pay | Admitting: Family Medicine

## 2014-10-28 ENCOUNTER — Ambulatory Visit (INDEPENDENT_AMBULATORY_CARE_PROVIDER_SITE_OTHER): Payer: Medicare Other | Admitting: Family Medicine

## 2014-10-28 ENCOUNTER — Encounter: Payer: Self-pay | Admitting: Family Medicine

## 2014-10-28 VITALS — BP 130/70 | HR 90 | Temp 97.8°F | Wt 117.0 lb

## 2014-10-28 DIAGNOSIS — L89329 Pressure ulcer of left buttock, unspecified stage: Secondary | ICD-10-CM | POA: Diagnosis not present

## 2014-10-28 DIAGNOSIS — I10 Essential (primary) hypertension: Secondary | ICD-10-CM | POA: Diagnosis not present

## 2014-10-28 MED ORDER — AMOXICILLIN-POT CLAVULANATE 875-125 MG PO TABS: 1.0000 | ORAL_TABLET | Freq: Two times a day (BID) | ORAL | Status: DC

## 2014-10-28 NOTE — Patient Instructions (Signed)
Pressure Ulcer A pressure ulcer is a sore that has formed from the breakdown of skin and exposure of deeper layers of tissue. It develops in areas of the body where there is unrelieved pressure. Pressure ulcers are usually found over a bony area, such as the shoulder blades, spine, lower back, hips, knees, ankles, and heels. Pressure ulcers vary in severity. Your health care provider may determine the severity (stage) of your pressure ulcer. The stages include:  Stage I--The skin is red, and when the skin is pressed, it stays red.  Stage II--The top layer of skin is gone, and there is a shallow, pink ulcer.  Stage III--The ulcer becomes deeper, and it is more difficult to see the whole wound. Also, there may be yellow or brown parts, as well as pink and red parts.  Stage IV--The ulcer may be deep and red, pink, brown, white, or yellow. Bone or muscle may be seen.  Unstageable pressure ulcer--The ulcer is covered almost completely with black, brown, or yellow tissue. It is not known how deep the ulcer is or what stage it is until this covering comes off.  Suspected deep tissue injury--A person's skin can be injured from pressure or pulling on the skin when his or her position is changed. The skin appears purple or maroon. There may not be an opening in the skin, but there could be a blood-filled blister. This deep tissue injury is often difficult to see in people with darker skin tones. The site may open and become deeper in time. However, early interventions will help the area heal and may prevent the area from opening. CAUSES  Pressure ulcers are caused by pressure against the skin that limits the flow of blood to the skin and nearby tissues. There are many risk factors that can lead to pressure sores. RISK FACTORS  Decreased ability to move.  Decreased ability to feel pain or discomfort.  Excessive skin moisture from urine, stool, sweat, or secretions.  Poor  nutrition.  Dehydration.  Tobacco, drug, or alcohol abuse.  Having someone pull on bedsheets that are under you, such as when health care workers are changing your position in a hospital bed.  Obesity.  Increased adult age.  Hospitalization in a critical care unit for longer than 4 days with use of medical devices.  Prolonged use of medical devices.  Critical illness.  Anemia.  Traumatic brain injury.  Spinal cord injury.  Stroke.  Diabetes.  Poor blood glucose control.  Low blood pressure (hypotension).  Low oxygen levels.  Medicines that reduce blood flow.  Infection. DIAGNOSIS  Your health care provider will diagnose your pressure ulcer based on its appearance. The health care provider may determine the stage of your pressure ulcer as well. Tests may be done to check for infection, to assess your circulation, or to check for other diseases, such as diabetes. TREATMENT  Treatment of your pressure ulcer begins with determining what stage the ulcer is in. Your treatment team may include your health care provider, a wound care specialist, a nutritionist, a physical therapist, and a surgeon. Possible treatments may include:   Moving or repositioning every 1-2 hours.  Using beds or mattresses to shift your body weight and pressure points frequently.  Improving your diet.  Cleaning and bandaging (dressing) the open wound.  Giving antibiotic medicines.  Removing damaged tissue.  Surgery and sometimes skin grafts. HOME CARE INSTRUCTIONS  If you were hospitalized, follow the care plan that was started in the hospital.    Avoid staying in the same position for more than 2 hours. Use padding, devices, or mattresses to cushion your pressure points as directed by your health care provider.  Eat a well-balanced diet. Take nutritional supplements and vitamins as directed by your health care provider.  Keep all follow-up appointments.  Only take over-the-counter or  prescription medicines for pain, fever, or discomfort as directed by your health care provider. SEEK MEDICAL CARE IF:   Your pressure ulcer is not improving.  You do not know how to care for your pressure ulcer.  You notice other areas of redness on your skin.  You have a fever. SEEK IMMEDIATE MEDICAL CARE IF:   You have increasing redness, swelling, or pain in your pressure ulcer.  You notice pus coming from your pressure ulcer.  You notice a bad smell coming from the wound or dressing.  Your pressure ulcer opens up again. Document Released: 03/01/2005 Document Revised: 03/06/2013 Document Reviewed: 11/06/2012 [employer] Patient Information 2015 [employer]. This information is not intended to replace advice given to you by your health care provider. Make sure you discuss any questions you have with your health care provider.  

## 2014-10-28 NOTE — Progress Notes (Signed)
   Subjective:    Patient ID: [name] Frank, female    DOB: [birth year], 80 y.o.   MRN: [id number]  HPI Patient seen with acute issue of "knot "they noticed on her buttock this past Friday. She apparently was touching her backside and noticed irregular skin. She's not noted any drainage. Minimal pain. No fevers or chills. No history of diabetes. She is very debilitated overall and spends most of her day sitting. She does have some urine incontinence history. No prior history of recent decubitus ulcers.  Nonsmoker and no history of peripheral vascular disease  Other medical problems include history of hyperlipidemia, hypertension, urinary urgency, osteoarthritis. Recent hip films revealed severe degenerative changes left hip. She is ambulating fairly well. Blood pressures have been stable.  Past Medical History  Diagnosis Date  . Allergy   . Hyperlipidemia   . Hypertension   . Anxiety   . GERD (gastroesophageal reflux disease)   . Urinary frequency   . Neurogenic urinary incontinence     since back surgery  . Arthritis     degenerative oateoarthritis   Past Surgical History  Procedure Laterality Date  . Appendectomy    . Cholecystectomy    . Abdominal hysterectomy    . Back surgery  2000  . Colonoscopy w/ polypectomy    . Breast surgery  1993    biopsy  . Total hip arthroplasty  01/07/2012    Procedure: TOTAL HIP ARTHROPLASTY;  Surgeon: Thera Flake., MD;  Location: [facility] OR;  Service: Orthopedics;  Laterality: Right;    reports that she quit smoking about 31 years ago. Her smoking use included Cigarettes. She has a 5 pack-year smoking history. She does not have any smokeless tobacco history on file. She reports that she does not drink alcohol or use illicit drugs. family history includes Alcohol abuse in an other family member; Arthritis in an other family member; Cancer in an other family member; Depression in an other family member; Diabetes in her mother; Heart disease in an  other family member; Hypertension in an other family member; Stroke in an other family member. Allergies  Allergen Reactions  . Codeine Sulfate     REACTION: stomach pains  . Tramadol Hcl     REACTION: stomach cramps      Review of Systems  Constitutional: Negative for fever and chills.  Respiratory: Negative for shortness of breath.   Gastrointestinal: Negative for nausea and vomiting.  Genitourinary: Negative for dysuria.  Psychiatric/Behavioral: Negative for confusion.       Objective:   Physical Exam  Constitutional:  Alert and cooperative elderly female  Cardiovascular: Normal rate and regular rhythm.   Pulmonary/Chest: Effort normal and breath sounds normal. No respiratory distress. She has no wheezes. She has no rales.  Skin:  She has fairly large at least 2 X 4 centimeter and fairly deep at least stage III decubitus ulcer Midline of her sacroiliac region-there is some necrotic appearing tissue and some foul odor.  No surrounding erythema or cellulitis changes          Assessment & Plan:  Fairly advanced sacroiliac decubitus ulcer in the midline-at least stage III if not stage IV. This will need some fairly urgent attention. Will consult wound care center. May need direct referral to surgeon for further evaluation.  Hypertension stable and at goal

## 2014-10-28 NOTE — Progress Notes (Signed)
Pre visit review using our clinic review tool, if applicable. No additional management support is needed unless otherwise documented below in the visit note. 

## 2014-10-30 ENCOUNTER — Encounter: Payer: Medicare Other | Attending: Surgery | Admitting: Surgery

## 2014-10-30 ENCOUNTER — Other Ambulatory Visit
Admission: RE | Admit: 2014-10-30 | Discharge: 2014-10-30 | Disposition: A | Discharge status: Home / Self Care | Payer: Medicare Other | Source: Ambulatory Visit | Attending: Surgery | Admitting: Surgery

## 2014-10-30 DIAGNOSIS — M1990 Unspecified osteoarthritis, unspecified site: Secondary | ICD-10-CM | POA: Diagnosis not present

## 2014-10-30 DIAGNOSIS — I10 Essential (primary) hypertension: Secondary | ICD-10-CM | POA: Insufficient documentation

## 2014-10-30 DIAGNOSIS — L89150 Pressure ulcer of sacral region, unstageable: Secondary | ICD-10-CM | POA: Insufficient documentation

## 2014-10-30 DIAGNOSIS — R32 Unspecified urinary incontinence: Secondary | ICD-10-CM | POA: Insufficient documentation

## 2014-10-30 DIAGNOSIS — L89159 Pressure ulcer of sacral region, unspecified stage: Secondary | ICD-10-CM | POA: Diagnosis not present

## 2014-10-30 DIAGNOSIS — F0390 Unspecified dementia without behavioral disturbance: Secondary | ICD-10-CM | POA: Diagnosis not present

## 2014-10-30 DIAGNOSIS — R895 Abnormal microbiological findings in specimens from other organs, systems and tissues: Secondary | ICD-10-CM | POA: Insufficient documentation

## 2014-10-30 NOTE — Progress Notes (Signed)
LURA, FALOR ([id number]) Visit Report for 10/30/2014 Abuse/Suicide Risk Screen Details Patient Name: [name] Frank, [name] Frank Date of Service: 10/30/2014 8:00 AM Medical Record Patient Account Number: [id number] Number: Treating RN: Curtis Sites Aug 10, 1934 (80 y.o. Other Clinician: Date of Birth/Sex: Female) Treating BURNS III, Primary Care Physician/Extender: Justine Null, Smitty Cords Physician: Referring Physician: Eulah Citizen in Treatment: 0 Abuse/Suicide Risk Screen Items Answer ABUSE/SUICIDE RISK SCREEN: Has anyone close to you tried to hurt or harm you recentlyo No Do you feel uncomfortable with anyone in your familyo No Has anyone forced you do things that you didnot want to doo No Do you have any thoughts of harming yourselfo No Patient displays signs or symptoms of abuse and/or neglect. No Electronic Signature(s) Signed: 10/30/2014 2:12:04 PM By: Curtis Sites Entered By: Curtis Sites on 10/30/2014 08:23:11 [name] Frank ([id number]) -------------------------------------------------------------------------------- Activities of Daily Living Details Patient Name: [name] Frank Date of Service: 10/30/2014 8:00 AM Medical Record Patient Account Number: [id number] Number: Treating RN: Curtis Sites Dec 14, 1934 (80 y.o. Other Clinician: Date of Birth/Sex: Female) Treating BURNS III, Primary Care Physician/Extender: Justine Null, Smitty Cords Physician: Referring Physician: Eulah Citizen in Treatment: 0 Activities of Daily Living Items Answer Activities of Daily Living (Please select one for each item) Drive Automobile Not Able Take Medications Need Assistance Use Telephone Need Assistance Care for Appearance Completely Able Use Toilet Completely Able Bath / Shower Need Assistance Dress Self Need Assistance Feed Self Completely Able Walk Completely Able Get In / Out Bed Completely Able Housework Need  Assistance Prepare Meals Need Assistance Handle Money Need Assistance Shop for Self Need Assistance Electronic Signature(s) Signed: 10/30/2014 2:12:04 PM By: Curtis Sites Entered By: Curtis Sites on 10/30/2014 08:24:03 [name] Frank ([id number]) -------------------------------------------------------------------------------- Education Assessment Details Patient Name: [name] Frank Date of Service: 10/30/2014 8:00 AM Medical Record Patient Account Number: [id number] Number: Treating RN: Curtis Sites 1935/01/30 (80 y.o. Other Clinician: Date of Birth/Sex: Female) Treating BURNS III, Primary Care Physician/Extender: Justine Null, Smitty Cords Physician: Referring Physician: Eulah Citizen in Treatment: 0 Primary Learner Assessed: Caregiver Reason Patient is not Primary Learner: dementia Learning Preferences/Education Level/Primary Language Learning Preference: Explanation, Demonstration Highest Education Level: High School Preferred Language: English Cognitive Barrier Assessment/Beliefs Language Barrier: No Translator Needed: No Memory Deficit: No Emotional Barrier: No Cultural/Religious Beliefs Affecting Medical No Care: Physical Barrier Assessment Impaired Vision: No Impaired Hearing: No Decreased Hand dexterity: No Knowledge/Comprehension Assessment Knowledge Level: Medium Comprehension Level: Medium Ability to understand written Medium instructions: Ability to understand verbal Medium instructions: Motivation Assessment Anxiety Level: Calm Cooperation: Cooperative Education Importance: Acknowledges Need Interest in Health Problems: Asks Questions Perception: Coherent Willingness to Engage in Self- Medium Management Activities: LEYTON, BROWNLEE ([id number]) Readiness to Engage in Self- Medium Management Activities: Electronic Signature(s) Signed: 10/30/2014 2:12:04 PM By: Curtis Sites Entered By: Curtis Sites on  10/30/2014 08:24:53 [name] Frank ([id number]) -------------------------------------------------------------------------------- Fall Risk Assessment Details Patient Name: [name] Frank Date of Service: 10/30/2014 8:00 AM Medical Record Patient Account Number: [id number] Number: Treating RN: Curtis Sites March 24, 1934 (80 y.o. Other Clinician: Date of Birth/Sex: Female) Treating BURNS III, Primary Care Physician/Extender: Justine Null, Smitty Cords Physician: Referring Physician: Eulah Citizen in Treatment: 0 Fall Risk Assessment Items FALL RISK ASSESSMENT: History of falling - immediate or within 3 months 0 No Secondary diagnosis 0 No Ambulatory aid None/bed rest/wheelchair/nurse 0 No Crutches/cane/walker 15 Yes Furniture 0 No IV Access/Saline Lock 0 No Gait/Training Normal/bed rest/immobile 0 No Weak 10 Yes Impaired 0 No Mental Status  Oriented to own ability 0 Yes Electronic Signature(s) Signed: 10/30/2014 2:12:04 PM By: Curtis Sites Entered By: Curtis Sites on 10/30/2014 08:25:13 [name] Frank ([id number]) -------------------------------------------------------------------------------- Nutrition Risk Assessment Details Patient Name: [name] Frank Date of Service: 10/30/2014 8:00 AM Medical Record Patient Account Number: [id number] Number: Treating RN: Curtis Sites 1934/05/16 (80 y.o. Other Clinician: Date of Birth/Sex: Female) Treating BURNS III, Primary Care Physician/Extender: Justine Null, Smitty Cords Physician: Referring Physician: Eulah Citizen in Treatment: 0 Height (in): Weight (lbs): Body Mass Index (BMI): Nutrition Risk Assessment Items NUTRITION RISK SCREEN: I have an illness or condition that made me change the kind and/or 0 No amount of food I eat I eat fewer than two meals per day 0 No I eat few fruits and vegetables, or milk products 0 No I have three or more drinks of beer,  liquor or wine almost every day 0 No I have tooth or mouth problems that make it hard for me to eat 0 No I don't always have enough money to buy the food I need 0 No I eat alone most of the time 0 No I take three or more different prescribed or over-the-counter drugs a 1 Yes day Without wanting to, I have lost or gained 10 pounds in the last six 0 No months I am not always physically able to shop, cook and/or feed myself 0 No Nutrition Protocols Good Risk Protocol 0 No interventions needed Moderate Risk Protocol Electronic Signature(s) Signed: 10/30/2014 2:12:04 PM By: Curtis Sites Entered By: Curtis Sites on 10/30/2014 08:25:23

## 2014-10-31 NOTE — Progress Notes (Signed)
[name], Frank ([id number]) Visit Report for 10/30/2014 Allergy List Details Patient Name: [name] Frank, [name] Frank. Date of Service: 10/30/2014 8:00 AM Medical Record Patient Account Number: [id number] Number: Treating RN: Curtis Sites 1934/12/15 (80 y.o. Other Clinician: Date of Birth/Sex: Female) Treating BURNS III, Primary Care Physician: Evelena Peat Physician/Extender: Zollie Beckers Referring Physician: Evelena Peat Weeks in Treatment: 0 Allergies Active Allergies codeine tramadol Allergy Notes Electronic Signature(s) Signed: 10/30/2014 2:12:04 PM By: Curtis Sites Entered By: Curtis Sites on 10/30/2014 08:18:34 [name] Frank ([id number]) -------------------------------------------------------------------------------- Arrival Information Details Patient Name: [name] Frank Date of Service: 10/30/2014 8:00 AM Medical Record Patient Account Number: [id number] Number: Treating RN: Curtis Sites [birth year] (80 y.o. Other Clinician: Date of Birth/Sex: Female) Treating BURNS III, Primary Care Physician: Evelena Peat Physician/Extender: Zollie Beckers Referring Physician: Eulah Citizen in Treatment: 0 Visit Information Patient Arrived: Cane Arrival Time: 08:16 Accompanied By: dtr Transfer Assistance: None Patient Identification Verified: Yes Secondary Verification Process Completed: Yes Electronic Signature(s) Signed: 10/30/2014 2:12:04 PM By: Curtis Sites Entered By: Curtis Sites on 10/30/2014 08:17:44 [name] Frank ([id number]) -------------------------------------------------------------------------------- Clinic Level of Care Assessment Details Patient Name: [name] Frank Date of Service: 10/30/2014 8:00 AM Medical Record Patient Account Number: [id number] Number: Treating RN: Curtis Sites 1935/03/11 (80 y.o. Other Clinician: Date of Birth/Sex: Female) Treating BURNS III, Primary Care  Physician: Evelena Peat Physician/Extender: Zollie Beckers Referring Physician: Eulah Citizen in Treatment: 0 Clinic Level of Care Assessment Items TOOL 1 Quantity Score []  - Use when EandM and Procedure is performed on INITIAL visit 0 ASSESSMENTS - Nursing Assessment / Reassessment X - General Physical Exam (combine w/ comprehensive assessment (listed just 1 20 below) when performed on new pt. evals) X - Comprehensive Assessment (HX, ROS, Risk Assessments, Wounds Hx, etc.) 1 25 ASSESSMENTS - Wound and Skin Assessment / Reassessment []  - Dermatologic / Skin Assessment (not related to wound area) 0 ASSESSMENTS - Ostomy and/or Continence Assessment and Care []  - Incontinence Assessment and Management 0 []  - Ostomy Care Assessment and Management (repouching, etc.) 0 PROCESS - Coordination of Care X - Simple Patient / Family Education for ongoing care 1 15 []  - Complex (extensive) Patient / Family Education for ongoing care 0 X - Staff obtains Consents, Records, Test Results / Process Orders 1 10 []  - Staff telephones HHA, Nursing Homes / Clarify orders / etc 0 []  - Routine Transfer to another Facility (non-emergent condition) 0 []  - Routine Hospital Admission (non-emergent condition) 0 X - New Admissions / Manufacturing engineer / Ordering NPWT, Apligraf, etc. 1 15 []  - Emergency Hospital Admission (emergent condition) 0 PROCESS - Special Needs []  - Pediatric / Minor Patient Management 0 EDWARDINE, DESCHEPPER ([id number]) []  - Isolation Patient Management 0 []  - Hearing / Language / Visual special needs 0 []  - Assessment of Community assistance (transportation, D/C planning, etc.) 0 []  - Additional assistance / Altered mentation 0 []  - Support Surface(s) Assessment (bed, cushion, seat, etc.) 0 INTERVENTIONS - Miscellaneous []  - External ear exam 0 []  - Patient Transfer (multiple staff / Nurse, adult / Similar devices) 0 []  - Simple Staple / Suture removal (25 or less) 0 []  -  Complex Staple / Suture removal (26 or more) 0 []  - Hypo/Hyperglycemic Management (do not check if billed separately) 0 []  - Ankle / Brachial Index (ABI) - do not check if billed separately 0 Has the patient been seen at the hospital within the last three years: Yes Total Score: 85 Level Of Care: New/Established - Level  3 Electronic Signature(s) Signed: 10/30/2014 9:49:25 AM By: Curtis Sites Entered By: Curtis Sites on 10/30/2014 09:49:24 [name] Frank ([id number]) -------------------------------------------------------------------------------- Encounter Discharge Information Details Patient Name: [name] Frank Date of Service: 10/30/2014 8:00 AM Medical Record Patient Account Number: [id number] Number: Treating RN: Curtis Sites [birth year] (80 y.o. Other Clinician: Date of Birth/Sex: Female) Treating BURNS III, Primary Care Physician: Evelena Peat Physician/Extender: Zollie Beckers Referring Physician: Eulah Citizen in Treatment: 0 Encounter Discharge Information Items Discharge Pain Level: 0 Discharge Condition: Stable Ambulatory Status: Cane Discharge Destination: Home Transportation: Private Auto Accompanied By: dtr in law Schedule Follow-up Appointment: Yes Medication Reconciliation completed No and provided to Patient/Care Saralyn Willison: Provided on Clinical Summary of Care: 10/30/2014 Form Type Recipient Paper Patient ST Electronic Signature(s) Signed: 10/30/2014 9:17:33 AM By: Gwenlyn Perking Entered By: Gwenlyn Perking on 10/30/2014 09:17:33 [name] Frank ([id number]) -------------------------------------------------------------------------------- Multi Wound Chart Details Patient Name: [name] Frank Date of Service: 10/30/2014 8:00 AM Medical Record Patient Account Number: [id number] Number: Treating RN: Curtis Sites January 09, 1935 (80 y.o. Other Clinician: Date of Birth/Sex: Female) Treating BURNS III, Primary  Care Physician: Evelena Peat Physician/Extender: Zollie Beckers Referring Physician: Eulah Citizen in Treatment: 0 Vital Signs Height(in): 66 Pulse(bpm): 78 Weight(lbs): 117 Blood Pressure 118/72 (mmHg): Body Mass Index(BMI): 19 Temperature(F): 98.1 Respiratory Rate 16 (breaths/min): Photos: [1:No Photos] [N/A:N/A] Wound Location: [1:Right Sacrum] [N/A:N/A] Wounding Event: [1:Gradually Appeared] [N/A:N/A] Primary Etiology: [1:Pressure Ulcer] [N/A:N/A] Comorbid History: [1:Hypertension, Osteoarthritis] [N/A:N/A] Date Acquired: [1:10/14/2014] [N/A:N/A] Weeks of Treatment: [1:0] [N/A:N/A] Wound Status: [1:Open] [N/A:N/A] Measurements L x W x D 5x3x0.2 [N/A:N/A] (cm) Area (cm) : [1:11.781] [N/A:N/A] Volume (cm) : [1:2.356] [N/A:N/A] % Reduction in Area: [1:0.00%] [N/A:N/A] % Reduction in Volume: 0.00% [N/A:N/A] Classification: [1:Unstageable/Unclassified] [N/A:N/A] Exudate Amount: [1:None Present] [N/A:N/A] Wound Margin: [1:Flat and Intact] [N/A:N/A] Granulation Amount: [1:None Present (0%)] [N/A:N/A] Necrotic Amount: [1:Large (67-100%)] [N/A:N/A] Necrotic Tissue: [1:Eschar] [N/A:N/A] Exposed Structures: [1:Fascia: No Fat: No Tendon: No Muscle: No Joint: No Bone: No] [N/A:N/A] Limited to Skin Breakdown Epithelialization: None N/A N/A Periwound Skin Texture: Edema: No N/A N/A Excoriation: No Induration: No Callus: No Crepitus: No Fluctuance: No Friable: No Rash: No Scarring: No Periwound Skin Moist: Yes N/A N/A Moisture: Maceration: No Dry/Scaly: No Periwound Skin Color: Atrophie Blanche: No N/A N/A Cyanosis: No Ecchymosis: No Erythema: No Hemosiderin Staining: No Mottled: No Pallor: No Rubor: No Temperature: No Abnormality N/A N/A Tenderness on Yes N/A N/A Palpation: Wound Preparation: Ulcer Cleansing: N/A N/A Rinsed/Irrigated with Saline Topical Anesthetic Applied: Other: lidocaine 4% Treatment Notes Electronic Signature(s) Signed:  10/30/2014 2:12:04 PM By: Curtis Sites Entered By: Curtis Sites on 10/30/2014 08:58:18 [name] Frank ([id number]) -------------------------------------------------------------------------------- Multi-Disciplinary Care Plan Details Patient Name: [name] Frank Date of Service: 10/30/2014 8:00 AM Medical Record Patient Account Number: [id number] Number: Treating RN: Curtis Sites 1934/04/29 (79 y.o. Other Clinician: Date of Birth/Sex: Female) Treating BURNS III, Primary Care Physician: Evelena Peat Physician/Extender: Zollie Beckers Referring Physician: Eulah Citizen in Treatment: 0 Active Inactive Abuse / Safety / Falls / Self Care Management Nursing Diagnoses: Impaired physical mobility Potential for falls Goals: Patient will remain injury free Date Initiated: 10/30/2014 Goal Status: Active Interventions: Assess fall risk on admission and as needed Notes: Orientation to the Wound Care Program Nursing Diagnoses: Knowledge deficit related to the wound healing center program Goals: Patient/caregiver will verbalize understanding of the Wound Healing Center Program Date Initiated: 10/30/2014 Goal Status: Active Interventions: Provide education on orientation to the wound center Notes: Pressure Nursing Diagnoses: Knowledge deficit related to causes and  risk factors for pressure ulcer development Potential for impaired tissue integrity related to pressure, friction, moisture, and shear QUENESHA, DOUGLASS ([id number]) Goals: Patient will remain free from development of additional pressure ulcers Date Initiated: 10/30/2014 Goal Status: Active Patient/caregiver will verbalize understanding of pressure ulcer management Date Initiated: 10/30/2014 Goal Status: Active Interventions: Assess: immobility, friction, shearing, incontinence upon admission and as needed Assess potential for pressure ulcer upon admission and as needed Provide education on  pressure ulcers Notes: Wound/Skin Impairment Nursing Diagnoses: Impaired tissue integrity Goals: Ulcer/skin breakdown will have a volume reduction of 30% by week 4 Date Initiated: 10/30/2014 Goal Status: Active Ulcer/skin breakdown will have a volume reduction of 50% by week 8 Date Initiated: 10/30/2014 Goal Status: Active Ulcer/skin breakdown will have a volume reduction of 80% by week 12 Date Initiated: 10/30/2014 Goal Status: Active Ulcer/skin breakdown will heal within 14 weeks Date Initiated: 10/30/2014 Goal Status: Active Interventions: Assess patient/caregiver ability to obtain necessary supplies Assess ulceration(s) every visit Notes: Electronic Signature(s) Signed: 10/30/2014 2:12:04 PM By: Curtis Sites Entered By: Curtis Sites on 10/30/2014 08:58:04 [name] Frank ([id number] Frank ([id number]) -------------------------------------------------------------------------------- Patient/Caregiver Education Details Patient Name: [name] Frank Date of Service: 10/30/2014 8:00 AM Medical Record Patient Account Number: [id number] Number: Treating RN: Curtis Sites Sep 27, 1934 (79 y.o. Other Clinician: Date of Birth/Gender: Female) Treating BURNS III, Primary Care Physician: Evelena Peat Physician/Extender: Zollie Beckers Referring Physician: Eulah Citizen in Treatment: 0 Education Assessment Education Provided To: Patient and Caregiver Education Topics Provided Pressure: Handouts: Preventing Pressure Ulcers Methods: Explain/Verbal Responses: State content correctly Wound/Skin Impairment: Handouts: Other: wound care as ordered Methods: Demonstration, Explain/Verbal Responses: State content correctly Electronic Signature(s) Signed: 10/30/2014 2:12:04 PM By: Curtis Sites Entered By: Curtis Sites on 10/30/2014 09:04:20 [name] Frank  ([id number]) -------------------------------------------------------------------------------- Wound Assessment Details Patient Name: [name] Frank Date of Service: 10/30/2014 8:00 AM Medical Record Patient Account Number: [id number] Number: Treating RN: Curtis Sites 14-Nov-1934 (79 y.o. Other Clinician: Date of Birth/Sex: Female) Treating BURNS III, Primary Care Physician: Evelena Peat Physician/Extender: Zollie Beckers Referring Physician: Evelena Peat Weeks in Treatment: 0 Wound Status Wound Number: 1 Primary Etiology: Pressure Ulcer Wound Location: Right Sacrum Wound Status: Open Wounding Event: Gradually Appeared Comorbid History: Hypertension, Osteoarthritis Date Acquired: 10/14/2014 Weeks Of Treatment: 0 Clustered Wound: No Photos Photo Uploaded By: Elliot Gurney, RN, BSN, Kim on 10/30/2014 17:02:21 Wound Measurements Length: (cm) 5 % Reduction in A Width: (cm) 3 % Reduction in V Depth: (cm) 0.2 Epithelializatio Area: (cm) 11.781 Tunneling: Volume: (cm) 2.356 Undermining: rea: 0% olume: 0% n: None No No Wound Description Classification: Unstageable/Unclassified Wound Margin: Flat and Intact Exudate Amount: None Present Foul Odor After Cleansing: No Wound Bed Granulation Amount: None Present (0%) Exposed Structure Necrotic Amount: Large (67-100%) Fascia Exposed: No Necrotic Quality: Eschar Fat Layer Exposed: No Tendon Exposed: No Muscle Exposed: No ZYRIA, FISCUS ([id number]) Joint Exposed: No Bone Exposed: No Limited to Skin Breakdown Periwound Skin Texture Texture Color No Abnormalities Noted: No No Abnormalities Noted: No Callus: No Atrophie Blanche: No Crepitus: No Cyanosis: No Excoriation: No Ecchymosis: No Fluctuance: No Erythema: No Friable: No Hemosiderin Staining: No Induration: No Mottled: No Localized Edema: No Pallor: No Rash: No Rubor: No Scarring: No Temperature / Pain Moisture Temperature: No  Abnormality No Abnormalities Noted: No Tenderness on Palpation: Yes Dry / Scaly: No Maceration: No Moist: Yes Wound Preparation Ulcer Cleansing: Rinsed/Irrigated with Saline Topical Anesthetic Applied: Other: lidocaine 4%, Treatment Notes Wound #1 (Right Sacrum) 1. Cleansed with: Clean wound with  Normal Saline 2. Anesthetic Topical Lidocaine 4% cream to wound bed prior to debridement 3. Peri-wound Care: Skin Prep 4. Dressing Applied: Aquacel Ag 5. Secondary Dressing Applied Bordered Foam Dressing Electronic Signature(s) Signed: 10/30/2014 2:12:04 PM By: Curtis Sites Entered By: Curtis Sites on 10/30/2014 08:43:38 [name] Frank ([id number]) -------------------------------------------------------------------------------- Vitals Details Patient Name: [name] Frank Date of Service: 10/30/2014 8:00 AM Medical Record Patient Account Number: [id number] Number: Treating RN: Curtis Sites [birth year] (79 y.o. Other Clinician: Date of Birth/Sex: Female) Treating BURNS III, Primary Care Physician: Evelena Peat Physician/Extender: Zollie Beckers Referring Physician: Eulah Citizen in Treatment: 0 Vital Signs Time Taken: 08:25 Temperature (F): 98.1 Height (in): 66 Pulse (bpm): 78 Source: Stated Respiratory Rate (breaths/min): 16 Weight (lbs): 117 Blood Pressure (mmHg): 118/72 Source: Stated Reference Range: 80 - 120 mg / dl Body Mass Index (BMI): 18.9 Electronic Signature(s) Signed: 10/30/2014 2:12:04 PM By: Curtis Sites Entered By: Curtis Sites on 10/30/2014 08:30:20

## 2014-10-31 NOTE — Progress Notes (Signed)
IVYROSE, HASHMAN ([id number]) Visit Report for 10/30/2014 Chief Complaint Document Details Patient Name: [name] Frank, [name] Frank. Date of Service: 10/30/2014 8:00 AM Medical Record Patient Account Number: [id number] Number: Treating RN: Curtis Sites 11-12-1934 (79 y.o. Other Clinician: Date of Birth/Sex: Female) Treating BURNS III, Primary Care Physician/Extender: Justine Null, Smitty Cords Physician: Referring Physician: Eulah Citizen in Treatment: 0 Information Obtained from: Patient Chief Complaint Sacral pressure ulcer. Electronic Signature(s) Signed: 10/30/2014 4:05:14 PM By: Madelaine Bhat MD Entered By: Madelaine Bhat on 10/30/2014 13:10:10 [name] Frank ([id number]) -------------------------------------------------------------------------------- Debridement Details Patient Name: [name] Frank Date of Service: 10/30/2014 8:00 AM Medical Record Patient Account Number: [id number] Number: Treating RN: Curtis Sites 1934-07-13 (79 y.o. Other Clinician: Date of Birth/Sex: Female) Treating BURNS III, Primary Care Physician/Extender: Justine Null, Smitty Cords Physician: Referring Physician: Eulah Citizen in Treatment: 0 Debridement Performed for Wound #1 Right Sacrum Assessment: Performed By: Physician BURNS III, Melanie Crazier., MD Debridement: Debridement Pre-procedure Yes Verification/Time Out Taken: Start Time: 08:58 Pain Control: Lidocaine 4% Topical Solution Level: Skin/Subcutaneous Tissue/Muscle Total Area Debrided (L x 5 (cm) x 3 (cm) = 15 (cm) W): Tissue and other Viable, Non-Viable, Eschar, Fibrin/Slough material debrided: Instrument: Curette, Forceps, Scissors Bleeding: Minimum Hemostasis Achieved: Pressure End Time: 09:02 Procedural Pain: 0 Post Procedural Pain: 0 Response to Treatment: Procedure was tolerated well Post Debridement Measurements of Total Wound Length: (cm) 5 Stage:  Unstageable/Unclassified Width: (cm) 3 Depth: (cm) 0.7 Volume: (cm) 8.247 Electronic Signature(s) Signed: 10/30/2014 2:12:04 PM By: Curtis Sites Signed: 10/30/2014 4:05:14 PM By: Madelaine Bhat MD Entered By: Madelaine Bhat on 10/30/2014 13:09:58 [name] Frank ([id number]) -------------------------------------------------------------------------------- HPI Details Patient Name: [name] Frank Date of Service: 10/30/2014 8:00 AM Medical Record Patient Account Number: [id number] Number: Treating RN: Curtis Sites [birth year] (79 y.o. Other Clinician: Date of Birth/Sex: Female) Treating BURNS III, Primary Care Physician/Extender: Justine Null, Smitty Cords Physician: Referring Physician: Eulah Citizen in Treatment: 0 History of Present Illness HPI Description: Pleasant 80 year old with history of dementia and osteoarthritis.. No diabetes. She noted a sacral pressure ulceration in early August 2016. She lives with her husband in [place], [place]. She is ambulatory but spends most of the day in a chair. No support surfaces. No prior history of pressure ulcerations. Tolerating a regular diet. Supplements with ensure. She denies any significant pain. Minimal drainage. Occasionally malodorous. No fever or chills. Electronic Signature(s) Signed: 10/30/2014 4:05:14 PM By: Madelaine Bhat MD Entered By: Madelaine Bhat on 10/30/2014 13:12:43 [name] Frank ([id number]) -------------------------------------------------------------------------------- Physical Exam Details Patient Name: [name] Frank Date of Service: 10/30/2014 8:00 AM Medical Record Patient Account Number: [id number] Number: Treating RN: Curtis Sites [birth year] (79 y.o. Other Clinician: Date of Birth/Sex: Female) Treating BURNS III, Primary Care Physician/Extender: Justine Null, Smitty Cords Physician: Referring Physician: Evelena Peat Weeks in Treatment: 0 Constitutional . Pulse regular. Respirations normal and unlabored. Afebrile. Marland Kitchen Respiratory WNL. No retractions.. Breath sounds WNL, No rubs, rales, rhonchi, or wheeze.. Cardiovascular Heart rhythm and rate regular, no murmur or gallop.. Gastrointestinal (GI) Abdomen without masses or tenderness.. Integumentary (Hair, Skin) .Marland Kitchen Neurological . Psychiatric . Marland Kitchen No evidence of depression, anxiety, or agitation.. Notes Sacral pressure ulcer covered with moist eschar. Sharply excised down to healthy subcutaneous fat. Debridement limited by patient discomfort. No exposed bone at this time. No cellulitis. Malodorous necrotic tissue sent for culture. Electronic Signature(s) Signed: 10/30/2014 4:05:14 PM By: Madelaine Bhat MD Entered By: Madelaine Bhat on 10/30/2014 13:13:53 [name] Frank, [name] Frank ([id number]) -------------------------------------------------------------------------------- Physician Orders Details Patient Name: [name] Frank Date of Service: 10/30/2014 8:00 AM Medical Record Patient Account Number: [id number] Number: Treating RN: Curtis Sites 1934-11-28 (79 y.o. Other Clinician: Date of Birth/Sex: Female) Treating BURNS III, Primary Care Physician/Extender: Justine Null, Smitty Cords Physician: Referring Physician: Eulah Citizen in Treatment: 0 Verbal / Phone Orders: Yes Clinician: Curtis Sites Read Back and Verified: Yes Diagnosis Coding Wound Cleansing Wound #1 Right Sacrum o Clean wound with Normal Saline. Anesthetic Wound #1 Right Sacrum o Topical Lidocaine 4% cream applied to wound bed prior to debridement Skin Barriers/Peri-Wound Care Wound #1 Right Sacrum o Skin Prep Primary Wound Dressing Wound #1 Right Sacrum o Aquacel Ag Secondary Dressing Wound #1 Right Sacrum o Boardered Foam Dressing Dressing Change Frequency Wound #1 Right Sacrum o Change dressing every day. Follow-up  Appointments Wound #1 Right Sacrum o Return Appointment in 1 week. Off-Loading Wound #1 Right Sacrum o Gel wheelchair cushion - HHRN to order for patient o Turn and reposition every 2 hours [name] Frank, [name] Frank. ([id number]) o Mattress - HHRN to order air mattress or appropriate gel overlay for patient's bed for patient Home Health Wound #1 Right Sacrum o Initiate Home Health for Skilled Nursing o Home Health Nurse may visit PRN to address patientos wound care needs. o FACE TO FACE ENCOUNTER: MEDICARE and MEDICAID PATIENTS: I certify that this patient is under my care and that I had a face-to-face encounter that meets the physician face-to-face encounter requirements with this patient on this date. The encounter with the patient was in whole or in part for the following MEDICAL CONDITION: (primary reason for Home Healthcare) MEDICAL NECESSITY: I certify, that based on my findings, NURSING services are a medically necessary home health service. HOME BOUND STATUS: I certify that my clinical findings support that this patient is homebound (i.e., Due to illness or injury, pt requires aid of supportive devices such as crutches, cane, wheelchairs, walkers, the use of special transportation or the assistance of another person to leave their place of residence. There is a normal inability to leave the home and doing so requires considerable and taxing effort. Other absences are for medical reasons / religious services and are infrequent or of short duration when for other reasons). o If current dressing causes regression in wound condition, may D/C ordered dressing product/s and apply Normal Saline Moist Dressing daily until next Wound Healing Center / Other MD appointment. Notify Wound Healing Center of regression in wound condition at [phone]. o Please direct any NON-WOUND related issues/requests for orders to patient's Primary Care Physician Medications-please add to  medication list. Wound #1 Right Sacrum o P.O. Antibiotics - doxycycline Laboratory o Culture and Sensitivity oooo Patient Medications Allergies: codeine, tramadol Notifications Medication Indication Start End doxycycline hyclate 10/30/2014 DOSE oral 100 mg tablet - tablet oral Electronic Signature(s) [name] Frank, [name] Frank ([id number]) Signed: 10/30/2014 9:30:17 AM By: Curtis Sites Signed: 10/30/2014 4:05:14 PM By: Madelaine Bhat MD Entered By: Curtis Sites on 10/30/2014 09:30:16 [name] Frank, [name] Frank ([id number]) -------------------------------------------------------------------------------- Prescription 10/30/2014 Patient Name: [name] Frank Physician: Madelaine Bhat MD Date of Birth: 1934/05/24 NPI#: [id number] Sex: F DEA#: Phone #: [phone] License #: Patient Address: [facility] [address] [place], [place] [address] [facility] [address] [place], [place] [address] [phone] Allergies codeine tramadol Medication Medication: Route: Strength: Form: doxycycline hyclate 100 mg oral 100 mg tablet tablet Class: TETRACYCLINES Dose: Frequency / Time: Indication: tablet oral Number  of Refills: Number of Units: 0 Generic Substitution: Start Date: End Date: One Time Use: Substitution Permitted 10/30/2014 No Note to Pharmacy: Signature(s): Date(s): [name] Frank, [name] Frank ([id number]) Electronic Signature(s) Signed: 10/30/2014 2:12:04 PM By: Curtis Sites Signed: 10/30/2014 4:05:14 PM By: Madelaine Bhat MD Entered By: Curtis Sites on 10/30/2014 09:30:17 [name] Frank ([id number]) --------------------------------------------------------------------------------  Problem List Details Patient Name: [name] Frank Date of Service: 10/30/2014 8:00 AM Medical Record Patient Account Number: [id number] Number: Treating RN: Curtis Sites June 12, 1934  (79 y.o. Other Clinician: Date of Birth/Sex: Female) Treating BURNS III, Primary Care Physician/Extender: Justine Null, Smitty Cords Physician: Referring Physician: Eulah Citizen in Treatment: 0 Active Problems ICD-10 Encounter Code Description Active Date Diagnosis L89.150 Pressure ulcer of sacral region, unstageable 10/30/2014 Yes F03.90 Unspecified dementia without behavioral disturbance 10/30/2014 Yes R32 Unspecified urinary incontinence 10/30/2014 Yes Inactive Problems Resolved Problems Electronic Signature(s) Signed: 10/30/2014 4:05:14 PM By: Madelaine Bhat MD Entered By: Madelaine Bhat on 10/30/2014 13:09:35 [name] Frank ([id number]) -------------------------------------------------------------------------------- Progress Note/History and Physical Details Patient Name: [name] Frank Date of Service: 10/30/2014 8:00 AM Medical Record Patient Account Number: [id number] Number: Treating RN: Curtis Sites 1934-05-28 (79 y.o. Other Clinician: Date of Birth/Sex: Female) Treating BURNS III, Primary Care Physician/Extender: Justine Null, Smitty Cords Physician: Referring Physician: Eulah Citizen in Treatment: 0 Subjective Chief Complaint Information obtained from Patient Sacral pressure ulcer. History of Present Illness (HPI) Pleasant 80 year old with history of dementia and osteoarthritis.. No diabetes. She noted a sacral pressure ulceration in early August 2016. She lives with her husband in [place], [place]. She is ambulatory but spends most of the day in a chair. No support surfaces. No prior history of pressure ulcerations. Tolerating a regular diet. Supplements with ensure. She denies any significant pain. Minimal drainage. Occasionally malodorous. No fever or chills. Wound History Patient presents with 1 open wound that has been present for approximately 2 weeks. Patient has been treating wound in the  following manner: no treatment. Laboratory tests have not been performed in the last month. Patient reportedly has not tested positive for an antibiotic resistant organism. Patient reportedly has not tested positive for osteomyelitis. Patient reportedly has not had testing performed to evaluate circulation in the legs. Patient History Information obtained from Patient, Caregiver. Allergies codeine, tramadol Family History No family history of Cancer, Diabetes, Heart Disease, Hereditary Spherocytosis, Hypertension, Kidney Disease, Lung Disease, Seizures, Stroke, Thyroid Problems, Tuberculosis. Social History Never smoker, Marital Status - Married, Alcohol Use - Never, Drug Use - No History, Caffeine Use - Never. [name] Frank, [name] Frank ([id number]) Medical History Cardiovascular Patient has history of Hypertension Musculoskeletal Patient has history of Osteoarthritis Oncologic Denies history of Received Chemotherapy, Received Radiation Psychiatric Denies history of Anorexia/bulimia, Confinement Anxiety Review of Systems (ROS) Constitutional Symptoms (General Health) The patient has no complaints or symptoms. Eyes The patient has no complaints or symptoms. Ear/Nose/Mouth/Throat The patient has no complaints or symptoms. Hematologic/Lymphatic The patient has no complaints or symptoms. Respiratory The patient has no complaints or symptoms. Cardiovascular The patient has no complaints or symptoms. Gastrointestinal The patient has no complaints or symptoms. Endocrine The patient has no complaints or symptoms. Genitourinary Complains or has symptoms of Incontinence/dribbling - neurogenic bladder. Immunological The patient has no complaints or symptoms. Integumentary (Skin) The patient has no complaints or symptoms. Musculoskeletal The patient has no complaints or symptoms. Neurologic The patient has no complaints or symptoms. Oncologic The patient has no complaints or  symptoms. Psychiatric Complains or has symptoms of Anxiety. Denies  complaints or symptoms of Claustrophobia. [address] MIKIALA, FUGETT. ([id number]) Constitutional Pulse regular. Respirations normal and unlabored. Afebrile. Vitals Time Taken: 8:25 AM, Height: 66 in, Source: Stated, Weight: 117 lbs, Source: Stated, BMI: 18.9, Temperature: 98.1 F, Pulse: 78 bpm, Respiratory Rate: 16 breaths/min, Blood Pressure: 118/72 mmHg. Respiratory WNL. No retractions.. Breath sounds WNL, No rubs, rales, rhonchi, or wheeze.. Cardiovascular Heart rhythm and rate regular, no murmur or gallop.. Gastrointestinal (GI) Abdomen without masses or tenderness.Marland Kitchen Psychiatric No evidence of depression, anxiety, or agitation.. General Notes: Sacral pressure ulcer covered with moist eschar. Sharply excised down to healthy subcutaneous fat. Debridement limited by patient discomfort. No exposed bone at this time. No cellulitis. Malodorous necrotic tissue sent for culture. Integumentary (Hair, Skin) Wound #1 status is Open. Original cause of wound was Gradually Appeared. The wound is located on the Right Sacrum. The wound measures 5cm length x 3cm width x 0.2cm depth; 11.781cm^2 area and 2.356cm^3 volume. The wound is limited to skin breakdown. There is no tunneling or undermining noted. There is a none present amount of drainage noted. The wound margin is flat and intact. There is no granulation within the wound bed. There is a large (67-100%) amount of necrotic tissue within the wound bed including Eschar. The periwound skin appearance exhibited: Moist. The periwound skin appearance did not exhibit: Callus, Crepitus, Excoriation, Fluctuance, Friable, Induration, Localized Edema, Rash, Scarring, Dry/Scaly, Maceration, Atrophie Blanche, Cyanosis, Ecchymosis, Hemosiderin Staining, Mottled, Pallor, Rubor, Erythema. Periwound temperature was noted as No Abnormality. The periwound has tenderness  on palpation. Assessment Active Problems ICD-10 L89.150 - Pressure ulcer of sacral region, unstageable F03.90 - Unspecified dementia without behavioral disturbance R32 - Unspecified urinary incontinence [name] Frank, [name] Frank. ([id number]) Sacral pressure ulcer, unstageable (at least stage III). Procedures Wound #1 Wound #1 is a Pressure Ulcer located on the Right Sacrum . There was a Skin/Subcutaneous Tissue/Muscle Debridement ([id number]) debridement with total area of 15 sq cm performed by BURNS III, Melanie Crazier., MD. with the following instrument(s): Curette, Forceps, and Scissors to remove Viable and Non-Viable tissue/material including Fibrin/Slough and Eschar after achieving pain control using Lidocaine 4% Topical Solution. A time out was conducted prior to the start of the procedure. A Minimum amount of bleeding was controlled with Pressure. The procedure was tolerated well with a pain level of 0 throughout and a pain level of 0 following the procedure. Post Debridement Measurements: 5cm length x 3cm width x 0.7cm depth; 8.247cm^3 volume. Post debridement Stage noted as Unstageable/Unclassified. Plan Wound Cleansing: Wound #1 Right Sacrum: Clean wound with Normal Saline. Anesthetic: Wound #1 Right Sacrum: Topical Lidocaine 4% cream applied to wound bed prior to debridement Skin Barriers/Peri-Wound Care: Wound #1 Right Sacrum: Skin Prep Primary Wound Dressing: Wound #1 Right Sacrum: Aquacel Ag Secondary Dressing: Wound #1 Right Sacrum: Boardered Foam Dressing Dressing Change Frequency: Wound #1 Right Sacrum: Change dressing every day. Follow-up Appointments: Wound #1 Right Sacrum: Return Appointment in 1 week. Off-Loading: Wound #1 Right Sacrum: [name] Frank, [name] Frank ([id number]) Gel wheelchair cushion - HHRN to order for patient Turn and reposition every 2 hours Mattress - HHRN to order air mattress or appropriate gel overlay for patient's bed for patient Home  Health: Wound #1 Right Sacrum: Initiate Home Health for Skilled Nursing Home Health Nurse may visit PRN to address patient s wound care needs. FACE TO FACE ENCOUNTER: MEDICARE and MEDICAID PATIENTS: I certify that this patient is under my care and that I had a face-to-face encounter that meets the physician face-to-face encounter requirements with this  patient on this date. The encounter with the patient was in whole or in part for the following MEDICAL CONDITION: (primary reason for Home Healthcare) MEDICAL NECESSITY: I certify, that based on my findings, NURSING services are a medically necessary home health service. HOME BOUND STATUS: I certify that my clinical findings support that this patient is homebound (i.e., Due to illness or injury, pt requires aid of supportive devices such as crutches, cane, wheelchairs, walkers, the use of special transportation or the assistance of another person to leave their place of residence. There is a normal inability to leave the home and doing so requires considerable and taxing effort. Other absences are for medical reasons / religious services and are infrequent or of short duration when for other reasons). If current dressing causes regression in wound condition, may D/C ordered dressing product/s and apply Normal Saline Moist Dressing daily until next Wound Healing Center / Other MD appointment. Notify Wound Healing Center of regression in wound condition at [phone]. Please direct any NON-WOUND related issues/requests for orders to patient's Primary Care Physician Medications-please add to medication list.: Wound #1 Right Sacrum: P.O. Antibiotics - doxycycline Laboratory ordered were: Culture and Sensitivity The following medication(s) was prescribed: doxycycline hyclate oral 100 mg tablet tablet oral starting 10/30/2014 Santyl. Doxycycline started. Follow-up on biopsy cultures obtained today. Offloading recommendations given. Gel overlay for  chair in bed. Nutritional recommendations given. Electronic Signature(s) Signed: 10/30/2014 4:05:14 PM By: Madelaine Bhat MD Entered By: Madelaine Bhat on 10/30/2014 13:15:38 [name] Frank ([id number]) -------------------------------------------------------------------------------- ROS/PFSH Details Patient Name: [name] Frank Date of Service: 10/30/2014 8:00 AM Medical Record Patient Account Number: [id number] Number: Treating RN: Curtis Sites 29-Jul-1934 (79 y.o. Other Clinician: Date of Birth/Sex: Female) Treating BURNS III, Primary Care Physician/Extender: Justine Null, Smitty Cords Physician: Referring Physician: Eulah Citizen in Treatment: 0 Label Progress Note Print Version as History and Physical for this encounter Information Obtained From Patient Caregiver Wound History Do you currently have one or more open woundso Yes How many open wounds do you currently haveo 1 Approximately how long have you had your woundso 2 weeks How have you been treating your wound(s) until nowo no treatment Has your wound(s) ever healed and then re-openedo No Have you had any lab work done in the past montho No Have you tested positive for an antibiotic resistant organism (MRSA, VRE)o No Have you tested positive for osteomyelitis (bone infection)o No Have you had any tests for circulation on your legso No Genitourinary Complaints and Symptoms: Positive for: Incontinence/dribbling - neurogenic bladder Psychiatric Complaints and Symptoms: Positive for: Anxiety Negative for: Claustrophobia Medical History: Negative for: Anorexia/bulimia; Confinement Anxiety Constitutional Symptoms (General Health) Complaints and Symptoms: No Complaints or Symptoms Eyes Complaints and Symptoms: No Complaints or Symptoms Ear/Nose/Mouth/Throat [name] Frank, HERSHKOWITZ ([id number]) Complaints and Symptoms: No Complaints or Symptoms Hematologic/Lymphatic Complaints and  Symptoms: No Complaints or Symptoms Respiratory Complaints and Symptoms: No Complaints or Symptoms Cardiovascular Complaints and Symptoms: No Complaints or Symptoms Medical History: Positive for: Hypertension Gastrointestinal Complaints and Symptoms: No Complaints or Symptoms Endocrine Complaints and Symptoms: No Complaints or Symptoms Immunological Complaints and Symptoms: No Complaints or Symptoms Integumentary (Skin) Complaints and Symptoms: No Complaints or Symptoms Musculoskeletal Complaints and Symptoms: No Complaints or Symptoms Medical History: Positive for: Osteoarthritis Neurologic SEJAL, COFIELD ([id number]) Complaints and Symptoms: No Complaints or Symptoms Oncologic Complaints and Symptoms: No Complaints or Symptoms Medical History: Negative for: Received Chemotherapy; Received Radiation Family and Social History Cancer: No; Diabetes: No; Heart Disease: No;  Hereditary Spherocytosis: No; Hypertension: No; Kidney Disease: No; Lung Disease: No; Seizures: No; Stroke: No; Thyroid Problems: No; Tuberculosis: No; Never smoker; Marital Status - Married; Alcohol Use: Never; Drug Use: No History; Caffeine Use: Never; Financial Concerns: No; Food, Clothing or Shelter Needs: No; Support System Lacking: No; Transportation Concerns: No; Advanced Directives: No; Patient does not want information on Advanced Directives Physician Affirmation I have reviewed and agree with the above information. Electronic Signature(s) Signed: 10/30/2014 2:12:04 PM By: Curtis Sites Signed: 10/30/2014 4:05:14 PM By: Madelaine Bhat MD Entered By: Madelaine Bhat on 10/30/2014 13:15:21 [name] Frank ([id number]) -------------------------------------------------------------------------------- SuperBill Details Patient Name: [name] Frank Date of Service: 10/30/2014 Medical Record Patient Account Number: [id number] Number: Treating RN: Curtis Sites 08/03/1934 (79 y.o. Other Clinician: Date of Birth/Sex: Female) Treating BURNS III, Primary Care Physician/Extender: Justine Null, Smitty Cords Physician: Tania Ade in Treatment: 0 Referring Physician: Evelena Peat Diagnosis Coding ICD-10 Codes Code Description L89.150 Pressure ulcer of sacral region, unstageable F03.90 Unspecified dementia without behavioral disturbance R32 Unspecified urinary incontinence Facility Procedures CPT4 Code: 32440102 Description: 99213 - WOUND CARE VISIT-LEV 3 EST PT Modifier: Quantity: 1 CPT4 Code: 72536644 Description: 11043 - DEB MUSC/FASCIA 20 SQ CM/< ICD-10 Description Diagnosis L89.150 Pressure ulcer of sacral region, unstageable Modifier: Quantity: 1 Physician Procedures CPT4 Code: 0347425 Description: WC PHYS LEVEL 3 o NEW PT ICD-10 Description Diagnosis L89.150 Pressure ulcer of sacral region, unstageable Modifier: Quantity: 1 CPT4 Code: 9563875 Description: 11043 - WC PHYS DEBR MUSCLE/FASCIA 20 SQ CM ICD-10 Description Diagnosis L89.150 Pressure ulcer of sacral region, unstageable Modifier: Quantity: 1 Electronic Signature(s) Signed: 10/30/2014 4:05:14 PM By: Madelaine Bhat MD Entered By: Madelaine Bhat on 10/30/2014 13:15:01

## 2014-11-01 DIAGNOSIS — Z4800 Encounter for change or removal of nonsurgical wound dressing: Secondary | ICD-10-CM | POA: Diagnosis not present

## 2014-11-01 DIAGNOSIS — L89150 Pressure ulcer of sacral region, unstageable: Secondary | ICD-10-CM | POA: Diagnosis not present

## 2014-11-01 DIAGNOSIS — R32 Unspecified urinary incontinence: Secondary | ICD-10-CM | POA: Diagnosis not present

## 2014-11-01 DIAGNOSIS — N319 Neuromuscular dysfunction of bladder, unspecified: Secondary | ICD-10-CM | POA: Diagnosis not present

## 2014-11-01 DIAGNOSIS — F419 Anxiety disorder, unspecified: Secondary | ICD-10-CM | POA: Diagnosis not present

## 2014-11-01 DIAGNOSIS — F0390 Unspecified dementia without behavioral disturbance: Secondary | ICD-10-CM | POA: Diagnosis not present

## 2014-11-01 DIAGNOSIS — I10 Essential (primary) hypertension: Secondary | ICD-10-CM | POA: Diagnosis not present

## 2014-11-01 DIAGNOSIS — M1990 Unspecified osteoarthritis, unspecified site: Secondary | ICD-10-CM | POA: Diagnosis not present

## 2014-11-04 DIAGNOSIS — F0390 Unspecified dementia without behavioral disturbance: Secondary | ICD-10-CM | POA: Diagnosis not present

## 2014-11-04 DIAGNOSIS — L89150 Pressure ulcer of sacral region, unstageable: Secondary | ICD-10-CM | POA: Diagnosis not present

## 2014-11-04 DIAGNOSIS — F419 Anxiety disorder, unspecified: Secondary | ICD-10-CM | POA: Diagnosis not present

## 2014-11-04 DIAGNOSIS — M1990 Unspecified osteoarthritis, unspecified site: Secondary | ICD-10-CM | POA: Diagnosis not present

## 2014-11-04 DIAGNOSIS — I10 Essential (primary) hypertension: Secondary | ICD-10-CM | POA: Diagnosis not present

## 2014-11-04 DIAGNOSIS — Z4800 Encounter for change or removal of nonsurgical wound dressing: Secondary | ICD-10-CM | POA: Diagnosis not present

## 2014-11-04 DIAGNOSIS — N319 Neuromuscular dysfunction of bladder, unspecified: Secondary | ICD-10-CM | POA: Diagnosis not present

## 2014-11-04 DIAGNOSIS — R32 Unspecified urinary incontinence: Secondary | ICD-10-CM | POA: Diagnosis not present

## 2014-11-04 LAB — WOUND CULTURE

## 2014-11-05 ENCOUNTER — Telehealth: Payer: Self-pay | Admitting: Family Medicine

## 2014-11-05 NOTE — Telephone Encounter (Signed)
[name] Frank call from [facility] to ask for verbal orders for physical therapy    [phone]

## 2014-11-05 NOTE — Telephone Encounter (Signed)
OK 

## 2014-11-06 ENCOUNTER — Ambulatory Visit
Admission: RE | Admit: 2014-11-06 | Discharge: 2014-11-06 | Disposition: A | Discharge status: Home / Self Care | Payer: Medicare Other | Source: Ambulatory Visit | Attending: Surgery | Admitting: Surgery

## 2014-11-06 ENCOUNTER — Other Ambulatory Visit: Payer: Self-pay | Admitting: Surgery

## 2014-11-06 ENCOUNTER — Encounter: Payer: Medicare Other | Admitting: Surgery

## 2014-11-06 DIAGNOSIS — I10 Essential (primary) hypertension: Secondary | ICD-10-CM | POA: Diagnosis not present

## 2014-11-06 DIAGNOSIS — M1990 Unspecified osteoarthritis, unspecified site: Secondary | ICD-10-CM | POA: Diagnosis not present

## 2014-11-06 DIAGNOSIS — L89150 Pressure ulcer of sacral region, unstageable: Secondary | ICD-10-CM | POA: Diagnosis not present

## 2014-11-06 DIAGNOSIS — X58XXXA Exposure to other specified factors, initial encounter: Secondary | ICD-10-CM | POA: Insufficient documentation

## 2014-11-06 DIAGNOSIS — S81809A Unspecified open wound, unspecified lower leg, initial encounter: Secondary | ICD-10-CM

## 2014-11-06 DIAGNOSIS — M533 Sacrococcygeal disorders, not elsewhere classified: Secondary | ICD-10-CM | POA: Diagnosis not present

## 2014-11-06 DIAGNOSIS — S31001A Unspecified open wound of lower back and pelvis with penetration into retroperitoneum, initial encounter: Secondary | ICD-10-CM | POA: Insufficient documentation

## 2014-11-06 DIAGNOSIS — L89154 Pressure ulcer of sacral region, stage 4: Secondary | ICD-10-CM | POA: Diagnosis not present

## 2014-11-06 DIAGNOSIS — S31000A Unspecified open wound of lower back and pelvis without penetration into retroperitoneum, initial encounter: Secondary | ICD-10-CM | POA: Diagnosis present

## 2014-11-06 DIAGNOSIS — M4628 Osteomyelitis of vertebra, sacral and sacrococcygeal region: Secondary | ICD-10-CM | POA: Diagnosis not present

## 2014-11-06 DIAGNOSIS — R32 Unspecified urinary incontinence: Secondary | ICD-10-CM | POA: Diagnosis not present

## 2014-11-06 DIAGNOSIS — F0390 Unspecified dementia without behavioral disturbance: Secondary | ICD-10-CM | POA: Diagnosis not present

## 2014-11-06 NOTE — Telephone Encounter (Signed)
Maria is informed.

## 2014-11-06 NOTE — Progress Notes (Signed)
Frank, [name] ([id number]) Visit Report for 11/06/2014 Chief Complaint Document Details Patient Name: [name] Frank, [name] Frank Date of Service: 11/06/2014 10:00 AM Medical Record Patient Account Number: [id number] Number: Treating RN: Curtis Sites June 02, 1934 (80 y.o. Other Clinician: Date of Birth/Sex: Female) Treating BURNS III, Primary Care Physician/Extender: Justine Null, Smitty Cords Physician: Referring Physician: Eulah Citizen in Treatment: 1 Information Obtained from: Patient Chief Complaint Sacral pressure ulcer. Electronic Signature(s) Signed: 11/06/2014 3:32:14 PM By: Madelaine Bhat MD Entered By: Madelaine Bhat on 11/06/2014 13:15:53 [name] Frank ([id number]) -------------------------------------------------------------------------------- Debridement Details Patient Name: [name] Frank Date of Service: 11/06/2014 10:00 AM Medical Record Patient Account Number: [id number] Number: Treating RN: Curtis Sites [birth year] (80 y.o. Other Clinician: Date of Birth/Sex: Female) Treating BURNS III, Primary Care Physician/Extender: Justine Null, Smitty Cords Physician: Referring Physician: Eulah Citizen in Treatment: 1 Debridement Performed for Wound #1 Right Sacrum Assessment: Performed By: Physician BURNS III, Melanie Crazier., MD Debridement: Debridement Pre-procedure Yes Verification/Time Out Taken: Start Time: 11:10 Pain Control: Lidocaine 4% Topical Solution Level: Skin/Subcutaneous Tissue/Muscle Total Area Debrided (L x 6.5 (cm) x 5 (cm) = 32.5 (cm) W): Tissue and other Viable, Non-Viable, Eschar, Fat, Fibrin/Slough, Other, Subcutaneous material debrided: Instrument: Forceps, Scissors Bleeding: Moderate Hemostasis Achieved: Silver Nitrate End Time: 11:13 Procedural Pain: 0 Post Procedural Pain: 0 Response to Treatment: Procedure was tolerated well Post Debridement Measurements of Total Wound Length:  (cm) 6.5 Stage: Category/Stage IV Width: (cm) 5 Depth: (cm) 1.4 Volume: (cm) 35.736 Post Procedure Diagnosis Same as Pre-procedure Notes Debrided to fascia. Electronic Signature(s) Signed: 11/06/2014 3:12:27 PM By: Curtis Sites Signed: 11/06/2014 3:32:14 PM By: Madelaine Bhat MD WYNELL, HALBERG ([id number]) Entered By: Madelaine Bhat on 11/06/2014 13:15:45 [name], Frank ([id number]) -------------------------------------------------------------------------------- HPI Details Patient Name: [name] Frank Date of Service: 11/06/2014 10:00 AM Medical Record Patient Account Number: [id number] Number: Treating RN: Curtis Sites 04-09-1934 (80 y.o. Other Clinician: Date of Birth/Sex: Female) Treating BURNS III, Primary Care Physician/Extender: Justine Null, Smitty Cords Physician: Referring Physician: Eulah Citizen in Treatment: 1 History of Present Illness HPI Description: Pleasant 80 year old with history of dementia and osteoarthritis. No diabetes. She noted a sacral pressure ulceration in early August 2016. She lives with her [place] in [place], [place]. She is ambulatory but spends most of the day in a chair. No support surfaces. No prior history of pressure ulcerations. Tolerating a regular diet. Supplements with ensure. Biopsy culture 10/30/2014 grew enterococcus avium sensitive to ampicillin. Patient is taking doxycycline. She returns to clinic for follow-up and is without new complaints. Has not received support surfaces. She denies any significant pain. Minimal drainage. Occasionally malodorous. No fever or chills. Electronic Signature(s) Signed: 11/06/2014 3:32:14 PM By: Madelaine Bhat MD Entered By: Madelaine Bhat on 11/06/2014 13:17:24 [name] Frank ([id number]) -------------------------------------------------------------------------------- Physical Exam Details Patient Name: [name] Frank  Date of Service: 11/06/2014 10:00 AM Medical Record Patient Account Number: [id number] Number: Treating RN: Curtis Sites [birth year] (80 y.o. Other Clinician: Date of Birth/Sex: Female) Treating BURNS III, Primary Care Physician/Extender: Justine Null, Smitty Cords Physician: Referring Physician: Evelena Peat Weeks in Treatment: 1 Constitutional . Pulse regular. Respirations normal and unlabored. Afebrile. . Notes Sacral pressure ulcer with extensive soft tissue necrosis. Debrided to fascia and periosteal tissue. This is a stage IV ulceration. Debridement and adequate secondary to patient discomfort. No significant cellulitis. No exposed bone at this time, but close. Electronic Signature(s) Signed: 11/06/2014 3:32:14 PM By: Madelaine Bhat MD Entered By: Reino Bellis,  Walter on 11/06/2014 13:18:33 JACINTA, PENALVER ([id number]) -------------------------------------------------------------------------------- Physician Orders Details Patient Name: [name] Frank Date of Service: 11/06/2014 10:00 AM Medical Record Patient Account Number: [id number] Number: Treating RN: Curtis Sites 1934-05-19 (80 y.o. Other Clinician: Date of Birth/Sex: Female) Treating BURNS III, Primary Care Physician/Extender: Justine Null, Smitty Cords Physician: Referring Physician: Eulah Citizen in Treatment: 1 Verbal / Phone Orders: Yes Clinician: Curtis Sites Read Back and Verified: Yes Diagnosis Coding Wound Cleansing Wound #1 Right Sacrum o Clean wound with Normal Saline. Anesthetic Wound #1 Right Sacrum o Topical Lidocaine 4% cream applied to wound bed prior to debridement Skin Barriers/Peri-Wound Care Wound #1 Right Sacrum o Skin Prep Primary Wound Dressing Wound #1 Right Sacrum o Aquacel Ag Secondary Dressing Wound #1 Right Sacrum o Boardered Foam Dressing Dressing Change Frequency Wound #1 Right Sacrum o Change dressing every  day. Follow-up Appointments Wound #1 Right Sacrum o Return Appointment in 1 week. Off-Loading Wound #1 Right Sacrum o Gel wheelchair cushion - HHRN to order for patient o Turn and reposition every 2 hours PAIZLEY, RAMELLA. ([id number]) o Mattress - HHRN to order air mattress or appropriate gel overlay for patient's bed for patient Home Health Wound #1 Right Sacrum o Continue [facility] Visits - [facility] - PLEASE PROVIDE WOUND CARE SUPPLIES FOR PATIENT OR ASSIST HER IN ORDERING THEM FROM [facility] (SHE IS ALREADY SET UP WITH THEM). ALSO, PLEASE ORDER GEL OVERLAY FOR BED AND CHAIR AND LET WOUND CARE CENTER KNOW IF ANY INFO IS NEEDED FOR THIS [phone] o Home Health Nurse may visit [facility] to address patientos wound care needs. o FACE TO FACE ENCOUNTER: MEDICARE and MEDICAID PATIENTS: I certify that this patient is under my care and that I had a face-to-face encounter that meets the physician face-to-face encounter requirements with this patient on this date. The encounter with the patient was in whole or in part for the following MEDICAL CONDITION: (primary reason for Home Healthcare) MEDICAL NECESSITY: I certify, that based on my findings, NURSING services are a medically necessary home health service. HOME BOUND STATUS: I certify that my clinical findings support that this patient is homebound (i.e., Due to illness or injury, pt requires aid of supportive devices such as crutches, cane, wheelchairs, walkers, the use of special transportation or the assistance of another person to leave their place of residence. There is a normal inability to leave the home and doing so requires considerable and taxing effort. Other absences are for medical reasons / religious services and are infrequent or of short duration when for other reasons). o If current dressing causes regression in wound condition, may D/C ordered dressing product/s and apply Normal Saline Moist Dressing  daily until next Wound Healing Center / Other MD appointment. Notify Wound Healing Center of regression in wound condition at [phone]. o Please direct any NON-WOUND related issues/requests for orders to patient's Primary Care Physician Medications-please add to medication list. Wound #1 Right Sacrum o P.O. Antibiotics Consults o General Surgery - for wound debridement oooo Radiology o X-ray, coccyx oooo KILANI, [name] ([id number]) Electronic Signature(s) Signed: 11/06/2014 3:12:27 PM By: Curtis Sites Signed: 11/06/2014 3:32:14 PM By: Madelaine Bhat MD Entered By: Curtis Sites on 11/06/2014 11:20:07 [name] Frank ([id number]) -------------------------------------------------------------------------------- Problem List Details Patient Name: [name] Frank Date of Service: 11/06/2014 10:00 AM Medical Record Patient Account Number: [id number] Number: Treating RN: Curtis Sites April 19, 1934 (79 y.o. Other Clinician: Date of Birth/Sex: Female) Treating BURNS III, Primary Care Physician/Extender:  Justine Null, Smitty Cords Physician: Referring Physician: Eulah Citizen in Treatment: 1 Active Problems ICD-10 Encounter Code Description Active Date Diagnosis L89.154 Pressure ulcer of sacral region, stage 4 10/30/2014 Yes F03.90 Unspecified dementia without behavioral disturbance 10/30/2014 Yes R32 Unspecified urinary incontinence 10/30/2014 Yes Inactive Problems Resolved Problems Electronic Signature(s) Signed: 11/06/2014 3:32:14 PM By: Madelaine Bhat MD Entered By: Madelaine Bhat on 11/06/2014 13:15:02 [name] Frank ([id number]) -------------------------------------------------------------------------------- Progress Note Details Patient Name: [name] Frank Date of Service: 11/06/2014 10:00 AM Medical Record Patient Account Number: [id number] Number: Treating RN: Curtis Sites [birth year] (79 y.o.  Other Clinician: Date of Birth/Sex: Female) Treating BURNS III, Primary Care Physician/Extender: Justine Null, Smitty Cords Physician: Referring Physician: Eulah Citizen in Treatment: 1 Subjective Chief Complaint Information obtained from Patient Sacral pressure ulcer. History of Present Illness (HPI) Pleasant 80 year old with history of dementia and osteoarthritis. No diabetes. She noted a sacral pressure ulceration in early August 2016. She lives with her husband in [place], [place]. She is ambulatory but spends most of the day in a chair. No support surfaces. No prior history of pressure ulcerations. Tolerating a regular diet. Supplements with ensure. Biopsy culture 10/30/2014 grew enterococcus avium sensitive to ampicillin. Patient is taking doxycycline. She returns to clinic for follow-up and is without new complaints. Has not received support surfaces. She denies any significant pain. Minimal drainage. Occasionally malodorous. No fever or chills. Objective Constitutional Pulse regular. Respirations normal and unlabored. Afebrile. Vitals Time Taken: 10:33 AM, Height: 66 in, Weight: 117 lbs, BMI: 18.9, Pulse: 103 bpm, Respiratory Rate: 16 breaths/min, Blood Pressure: 115/59 mmHg. General Notes: Sacral pressure ulcer with extensive soft tissue necrosis. Debrided to fascia and periosteal tissue. This is a stage IV ulceration. Debridement and adequate secondary to patient discomfort. No significant cellulitis. No exposed bone at this time, but close. Integumentary (Hair, Skin) CATHE, BILGER. ([id number]) Wound #1 status is Open. Original cause of wound was Gradually Appeared. The wound is located on the Right Sacrum. The wound measures 6.5cm length x 5cm width x 1.4cm depth; 25.525cm^2 area and 35.736cm^3 volume. The wound is limited to skin breakdown. There is no tunneling or undermining noted. There is a large amount of purulent drainage noted. The wound  margin is flat and intact. There is no granulation within the wound bed. There is a large (67-100%) amount of necrotic tissue within the wound bed including Eschar. The periwound skin appearance exhibited: Moist. The periwound skin appearance did not exhibit: Callus, Crepitus, Excoriation, Fluctuance, Friable, Induration, Localized Edema, Rash, Scarring, Dry/Scaly, Maceration, Atrophie Blanche, Cyanosis, Ecchymosis, Hemosiderin Staining, Mottled, Pallor, Rubor, Erythema. Periwound temperature was noted as No Abnormality. The periwound has tenderness on palpation. Assessment Active Problems ICD-10 L89.154 - Pressure ulcer of sacral region, stage 4 F03.90 - Unspecified dementia without behavioral disturbance R32 - Unspecified urinary incontinence Sacral pressure ulcer, stage IV. Procedures Wound #1 Wound #1 is a Pressure Ulcer located on the Right Sacrum . There was a Skin/Subcutaneous Tissue/Muscle Debridement ([id number]) debridement with total area of 32.5 sq cm performed by BURNS III, Melanie Crazier., MD. with the following instrument(s): Forceps and Scissors to remove Viable and Non-Viable tissue/material including Fat, Fibrin/Slough, Eschar, Other, and Subcutaneous after achieving pain control using Lidocaine 4% Topical Solution. A time out was conducted prior to the start of the procedure. A Moderate amount of bleeding was controlled with Silver Nitrate. The procedure was tolerated well with a pain level of 0 throughout and a pain level of 0 following the procedure. Post Debridement  Measurements: 6.5cm length x 5cm width x 1.4cm depth; 35.736cm^3 volume. Post debridement Stage noted as Category/Stage IV. Post procedure Diagnosis Wound #1: Same as Pre-Procedure General Notes: Debrided to fascia.Marland Kitchen ROGINA, SCHIANO. ([id number]) Plan Wound Cleansing: Wound #1 Right Sacrum: Clean wound with Normal Saline. Anesthetic: Wound #1 Right Sacrum: Topical Lidocaine 4% cream applied to wound  bed prior to debridement Skin Barriers/Peri-Wound Care: Wound #1 Right Sacrum: Skin Prep Primary Wound Dressing: Wound #1 Right Sacrum: Aquacel Ag Secondary Dressing: Wound #1 Right Sacrum: Boardered Foam Dressing Dressing Change Frequency: Wound #1 Right Sacrum: Change dressing every day. Follow-up Appointments: Wound #1 Right Sacrum: Return Appointment in 1 week. Off-Loading: Wound #1 Right Sacrum: Gel wheelchair cushion - HHRN to order for patient Turn and reposition every 2 hours Mattress - HHRN to order air mattress or appropriate gel overlay for patient's bed for patient Home Health: Wound #1 Right Sacrum: Continue [facility] Visits - [facility] - PLEASE PROVIDE WOUND CARE SUPPLIES FOR PATIENT OR ASSIST HER IN ORDERING THEM FROM [facility] (SHE IS ALREADY SET UP WITH THEM). ALSO, PLEASE ORDER GEL OVERLAY FOR BED AND CHAIR AND LET WOUND CARE CENTER KNOW IF ANY INFO IS NEEDED FOR THIS [phone] Home Health Nurse may visit [facility] to address patient s wound care needs. FACE TO FACE ENCOUNTER: MEDICARE and MEDICAID PATIENTS: I certify that this patient is under my care and that I had a face-to-face encounter that meets the physician face-to-face encounter requirements with this patient on this date. The encounter with the patient was in whole or in part for the following MEDICAL CONDITION: (primary reason for Home Healthcare) MEDICAL NECESSITY: I certify, that based on my findings, NURSING services are a medically necessary home health service. HOME BOUND STATUS: I certify that my clinical findings support that this patient is homebound (i.e., Due to illness or injury, pt requires aid of supportive devices such as crutches, cane, wheelchairs, walkers, the use of special transportation or the assistance of another person to leave their place of residence. There is a normal inability to leave the home and doing so requires considerable and taxing effort. Other absences are for  medical reasons / religious services and are infrequent or of short duration when for other reasons). If current dressing causes regression in wound condition, may D/C ordered dressing product/s and apply Normal Saline Moist Dressing daily until next Wound Healing Center / Other MD appointment. Notify Wound Healing Center of regression in wound condition at [phone]. Please direct any NON-WOUND related issues/requests for orders to patient's Primary Care Physician Medications-please add to medication list.: WARRENE, KAPFER. ([id number]) Wound #1 Right Sacrum: P.O. Antibiotics Radiology ordered were: X-ray, coccyx Consults ordered were: General Surgery - for wound debridement Silver alginate dressing changes. Reorder gel overlay for chair and mattress. Frequent turning. I think that she would benefit from debridement under anesthesia and subsequent wound VAC. Will make surgical referral in [facility] per patient's request. Antibiotics switched to Augmentin. X-ray today 11/06/2014 showed probable destruction at the distal sacrum and coccyx. CT or MRI recommended. Will order CT. Electronic Signature(s) Signed: 11/06/2014 3:32:14 PM By: Madelaine Bhat MD Entered By: Madelaine Bhat on 11/06/2014 14:56:21 [name] Frank ([id number]) -------------------------------------------------------------------------------- SuperBill Details Patient Name: [name] Frank Date of Service: 11/06/2014 Medical Record Patient Account Number: [id number] Number: Treating RN: Curtis Sites Dec 16, 1934 (79 y.o. Other Clinician: Date of Birth/Sex: Female) Treating BURNS III, Primary Care Physician/Extender: Justine Null, Smitty Cords Physician: Weeks in Treatment: 1 Referring Physician:  BURCHETTE, BRUCE Diagnosis Coding ICD-10 Codes Code Description L89.154 Pressure ulcer of sacral region, stage 4 F03.90 Unspecified dementia without behavioral disturbance R32 Unspecified  urinary incontinence M46.28 Osteomyelitis of vertebra, sacral and sacrococcygeal region Facility Procedures CPT4 Code: 16109604 Description: 11043 - DEB MUSC/FASCIA 20 SQ CM/< ICD-10 Description Diagnosis L89.154 Pressure ulcer of sacral region, stage 4 Modifier: Quantity: 1 CPT4 Code: [id number] Description: 11046 - DEB MUSC/FASCIA EA ADDL 20 CM ICD-10 Description Diagnosis L89.154 Pressure ulcer of sacral region, stage 4 Modifier: Quantity: 1 Physician Procedures CPT4 Code: [id number] Description: 99213 - WC PHYS LEVEL 3 - EST PT ICD-10 Description Diagnosis M46.28 Osteomyelitis of vertebra, sacral and sacrococcyg Modifier: eal region Quantity: 1 CPT4 Code: 6213086 Description: 11043 - WC PHYS DEBR MUSCLE/FASCIA 20 SQ CM ICD-10 Description Diagnosis L89.154 Pressure ulcer of sacral region, stage 4 Modifier: Quantity: 1 CPT4 Code: [id number] Kocher, SHI Description: 11046 - WC PHYS DEB MUSC/FASC EA ADDL 20 CM RLEY M. ([id number]) Modifier: Quantity: 1 Electronic Signature(s) Signed: 11/06/2014 3:32:14 PM By: Madelaine Bhat MD Entered By: Madelaine Bhat on 11/06/2014 14:57:22

## 2014-11-06 NOTE — Progress Notes (Signed)
[name] Frank ([id number]) Visit Report for 11/06/2014 Arrival Information Details Patient Name: [name] Frank Date of Service: 11/06/2014 10:00 AM Medical Record Patient Account Number: [id number] Number: Treating RN: [name] Frank 03-15-1935 (80 y.o. Other Clinician: Date of Birth/Sex: Female) Treating BURNS III, Primary Care Physician: Evelena Peat Physician/Extender: Zollie Beckers Referring Physician: Eulah Citizen in Treatment: 1 Visit Information History Since Last Visit Added or deleted any medications: No Patient Arrived: Cane Any new allergies or adverse reactions: No Arrival Time: 10:32 Had a fall or experienced change in No Accompanied By: dtr activities of daily living that may affect Transfer Assistance: None risk of falls: Patient Identification Verified: Yes Signs or symptoms of abuse/neglect since last No Secondary Verification Process Completed: Yes visito Hospitalized since last visit: No Pain Present Now: No Electronic Signature(s) Signed: 11/06/2014 3:12:27 PM By: [name] Frank Entered By: [name] Frank on 11/06/2014 10:32:18 [name] Frank ([id number]) -------------------------------------------------------------------------------- Encounter Discharge Information Details Patient Name: [name] Frank Date of Service: 11/06/2014 10:00 AM Medical Record Patient Account Number: [id number] Number: Treating RN: [name] Frank 1934-07-05 (80 y.o. Other Clinician: Date of Birth/Sex: Female) Treating BURNS III, Primary Care Physician: Evelena Peat Physician/Extender: Zollie Beckers Referring Physician: Eulah Citizen in Treatment: 1 Encounter Discharge Information Items Discharge Pain Level: 0 Discharge Condition: Stable Ambulatory Status: Cane Discharge Destination: Home Transportation: Private Auto Accompanied By: dtr Schedule Follow-up Appointment: Yes Medication Reconciliation  completed No and provided to Patient/Care [name] Frank: Provided on Clinical Summary of Care: 11/06/2014 Form Type Recipient Paper Patient ST Electronic Signature(s) Signed: 11/06/2014 11:40:20 AM By: [name] Frank Entered By: [name] Frank on 11/06/2014 11:40:20 [name] Frank ([id number]) -------------------------------------------------------------------------------- Multi Wound Chart Details Patient Name: [name] Frank Date of Service: 11/06/2014 10:00 AM Medical Record Patient Account Number: [id number] Number: Treating RN: [name] Frank [birth year] (80 y.o. Other Clinician: Date of Birth/Sex: Female) Treating BURNS III, Primary Care Physician: Evelena Peat Physician/Extender: Zollie Beckers Referring Physician: Eulah Citizen in Treatment: 1 Vital Signs Height(in): 66 Pulse(bpm): 103 Weight(lbs): 117 Blood Pressure 115/59 (mmHg): Body Mass Index(BMI): 19 Temperature(F): Respiratory Rate 16 (breaths/min): Photos: [1:No Photos] [N/A:N/A] Wound Location: [1:Right Sacrum] [N/A:N/A] Wounding Event: [1:Gradually Appeared] [N/A:N/A] Primary Etiology: [1:Pressure Ulcer] [N/A:N/A] Comorbid History: [1:Hypertension, Osteoarthritis] [N/A:N/A] Date Acquired: [1:10/14/2014] [N/A:N/A] Weeks of Treatment: [1:1] [N/A:N/A] Wound Status: [1:Open] [N/A:N/A] Measurements L x W x D 6.5x5x1.4 [N/A:N/A] (cm) Area (cm) : [1:25.525] [N/A:N/A] Volume (cm) : [1:35.736] [N/A:N/A] % Reduction in Area: [1:-116.70%] [N/A:N/A] % Reduction in Volume: -1416.80% [N/A:N/A] Classification: [1:Unstageable/Unclassified] [N/A:N/A] Exudate Amount: [1:Large] [N/A:N/A] Exudate Type: [1:Purulent] [N/A:N/A] Exudate Color: [1:yellow, brown, green] [N/A:N/A] Foul Odor After [1:Yes] [N/A:N/A] Cleansing: Odor Anticipated Due to No [N/A:N/A] Product Use: Wound Margin: [1:Flat and Intact] [N/A:N/A] Granulation Amount: [1:None Present (0%)] [N/A:N/A] Necrotic Amount: [1:Large  (67-100%)] [N/A:N/A] Necrotic Tissue: [1:Eschar] [N/A:N/A] Exposed Structures: [N/A:N/A] Fascia: No Fat: No Tendon: No Muscle: No Joint: No Bone: No Limited to Skin Breakdown Epithelialization: None N/A N/A Periwound Skin Texture: Edema: No N/A N/A Excoriation: No Induration: No Callus: No Crepitus: No Fluctuance: No Friable: No Rash: No Scarring: No Periwound Skin Moist: Yes N/A N/A Moisture: Maceration: No Dry/Scaly: No Periwound Skin Color: Atrophie Blanche: No N/A N/A Cyanosis: No Ecchymosis: No Erythema: No Hemosiderin Staining: No Mottled: No Pallor: No Rubor: No Temperature: No Abnormality N/A N/A Tenderness on Yes N/A N/A Palpation: Wound Preparation: Ulcer Cleansing: N/A N/A Rinsed/Irrigated with Saline Topical Anesthetic Applied: Other: lidocaine 4% Treatment Notes Electronic Signature(s) Signed: 11/06/2014 10:50:48 AM By: [name] Frank Entered By: [name] Frank  on 11/06/2014 10:50:47 [name] Frank ([id number]) -------------------------------------------------------------------------------- Multi-Disciplinary Care Plan Details Patient Name: [name] Frank Date of Service: 11/06/2014 10:00 AM Medical Record Patient Account Number: [id number] Number: Treating RN: [name] Frank 08-18-1934 (80 y.o. Other Clinician: Date of Birth/Sex: Female) Treating BURNS III, Primary Care Physician: Evelena Peat Physician/Extender: Zollie Beckers Referring Physician: Eulah Citizen in Treatment: 1 Active Inactive Abuse / Safety / Falls / Self Care Management Nursing Diagnoses: Impaired physical mobility Potential for falls Goals: Patient will remain injury free Date Initiated: 10/30/2014 Goal Status: Active Interventions: Assess fall risk on admission and as needed Notes: Orientation to the Wound Care Program Nursing Diagnoses: Knowledge deficit related to the wound healing center program Goals: Patient/caregiver will  verbalize understanding of the Wound Healing Center Program Date Initiated: 10/30/2014 Goal Status: Active Interventions: Provide education on orientation to the wound center Notes: Pressure Nursing Diagnoses: Knowledge deficit related to causes and risk factors for pressure ulcer development Potential for impaired tissue integrity related to pressure, friction, moisture, and shear [name] Frank ([id number]) Goals: Patient will remain free from development of additional pressure ulcers Date Initiated: 10/30/2014 Goal Status: Active Patient/caregiver will verbalize understanding of pressure ulcer management Date Initiated: 10/30/2014 Goal Status: Active Interventions: Assess: immobility, friction, shearing, incontinence upon admission and as needed Assess potential for pressure ulcer upon admission and as needed Provide education on pressure ulcers Notes: Wound/Skin Impairment Nursing Diagnoses: Impaired tissue integrity Goals: Ulcer/skin breakdown will have a volume reduction of 30% by week 4 Date Initiated: 10/30/2014 Goal Status: Active Ulcer/skin breakdown will have a volume reduction of 50% by week 8 Date Initiated: 10/30/2014 Goal Status: Active Ulcer/skin breakdown will have a volume reduction of 80% by week 12 Date Initiated: 10/30/2014 Goal Status: Active Ulcer/skin breakdown will heal within 14 weeks Date Initiated: 10/30/2014 Goal Status: Active Interventions: Assess patient/caregiver ability to obtain necessary supplies Assess ulceration(s) every visit Notes: Electronic Signature(s) Signed: 11/06/2014 10:50:39 AM By: [name] Frank Entered By: [name] Frank on 11/06/2014 10:50:39 [name] Frank ([id number] Frank ([id number]) -------------------------------------------------------------------------------- Patient/Caregiver Education Details Patient Name: [name] Frank Date of Service: 11/06/2014 10:00 AM Medical Record Patient  Account Number: [id number] Number: Treating RN: [name] Frank 07-17-1934 (79 y.o. Other Clinician: Date of Birth/Gender: Female) Treating BURNS III, Primary Care Physician: Evelena Peat Physician/Extender: Zollie Beckers Referring Physician: Eulah Citizen in Treatment: 1 Education Assessment Education Provided To: Patient and Caregiver Education Topics Provided Pressure: Handouts: Other: offloading Methods: Explain/Verbal Responses: State content correctly Wound/Skin Impairment: Handouts: Other: wound care as ordered Methods: Demonstration, Explain/Verbal Responses: State content correctly Electronic Signature(s) Signed: 11/06/2014 10:51:19 AM By: [name] Frank Entered By: [name] Frank on 11/06/2014 10:51:18 [name] Frank ([id number]) -------------------------------------------------------------------------------- Wound Assessment Details Patient Name: [name] Frank Date of Service: 11/06/2014 10:00 AM Medical Record Patient Account Number: [id number] Number: Treating RN: [name] Frank 1934-10-10 (79 y.o. Other Clinician: Date of Birth/Sex: Female) Treating BURNS III, Primary Care Physician: Evelena Peat Physician/Extender: Zollie Beckers Referring Physician: Evelena Peat Weeks in Treatment: 1 Wound Status Wound Number: 1 Primary Etiology: Pressure Ulcer Wound Location: Right Sacrum Wound Status: Open Wounding Event: Gradually Appeared Comorbid History: Hypertension, Osteoarthritis Date Acquired: 10/14/2014 Weeks Of Treatment: 1 Clustered Wound: No Photos Photo Uploaded By: [name] Frank on 11/06/2014 12:56:03 Wound Measurements Length: (cm) 6.5 Width: (cm) 5 Depth: (cm) 1.4 Area: (cm) 25.525 Volume: (cm) 35.736 % Reduction in Area: -116.7% % Reduction in Volume: -1416.8% Epithelialization: None Tunneling: No Undermining: No Wound Description Classification: Unstageable/Unclassified Wound Margin: Flat and  Intact  Exudate Amount: Large Exudate Type: Purulent Exudate Color: yellow, brown, green Foul Odor After Cleansing: Yes Due to Product Use: No Wound Bed Granulation Amount: None Present (0%) Exposed Structure Necrotic Amount: Large (67-100%) Fascia Exposed: No Necrotic Quality: Eschar Fat Layer Exposed: No SHAWONDA, KERCE ([id number]) Tendon Exposed: No Muscle Exposed: No Joint Exposed: No Bone Exposed: No Limited to Skin Breakdown Periwound Skin Texture Texture Color No Abnormalities Noted: No No Abnormalities Noted: No Callus: No Atrophie Blanche: No Crepitus: No Cyanosis: No Excoriation: No Ecchymosis: No Fluctuance: No Erythema: No Friable: No Hemosiderin Staining: No Induration: No Mottled: No Localized Edema: No Pallor: No Rash: No Rubor: No Scarring: No Temperature / Pain Moisture Temperature: No Abnormality No Abnormalities Noted: No Tenderness on Palpation: Yes Dry / Scaly: No Maceration: No Moist: Yes Wound Preparation Ulcer Cleansing: Rinsed/Irrigated with Saline Topical Anesthetic Applied: Other: lidocaine 4%, Treatment Notes Wound #1 (Right Sacrum) 1. Cleansed with: Clean wound with Normal Saline 2. Anesthetic Topical Lidocaine 4% cream to wound bed prior to debridement 4. Dressing Applied: Aquacel Ag 5. Secondary Dressing Applied Bordered Foam Dressing Electronic Signature(s) Signed: 11/06/2014 3:12:27 PM By: [name] Frank Entered By: [name] Frank on 11/06/2014 10:41:45 [name] Frank ([id number]) -------------------------------------------------------------------------------- Vitals Details Patient Name: [name] Frank Date of Service: 11/06/2014 10:00 AM Medical Record Patient Account Number: [id number] Number: Treating RN: [name] Frank Jul 29, 1934 (79 y.o. Other Clinician: Date of Birth/Sex: Female) Treating BURNS III, Primary Care Physician: Evelena Peat Physician/Extender: Zollie Beckers Referring  Physician: Eulah Citizen in Treatment: 1 Vital Signs Time Taken: 10:33 Pulse (bpm): 103 Height (in): 66 Respiratory Rate (breaths/min): 16 Weight (lbs): 117 Blood Pressure (mmHg): 115/59 Body Mass Index (BMI): 18.9 Reference Range: 80 - 120 mg / dl Electronic Signature(s) Signed: 11/06/2014 3:12:27 PM By: [name] Frank Entered By: [name] Frank on 11/06/2014 10:34:29

## 2014-11-08 ENCOUNTER — Telehealth: Payer: Self-pay | Admitting: Family Medicine

## 2014-11-08 DIAGNOSIS — I10 Essential (primary) hypertension: Secondary | ICD-10-CM | POA: Diagnosis not present

## 2014-11-08 DIAGNOSIS — M1990 Unspecified osteoarthritis, unspecified site: Secondary | ICD-10-CM | POA: Diagnosis not present

## 2014-11-08 DIAGNOSIS — F419 Anxiety disorder, unspecified: Secondary | ICD-10-CM | POA: Diagnosis not present

## 2014-11-08 DIAGNOSIS — N319 Neuromuscular dysfunction of bladder, unspecified: Secondary | ICD-10-CM | POA: Diagnosis not present

## 2014-11-08 DIAGNOSIS — F0390 Unspecified dementia without behavioral disturbance: Secondary | ICD-10-CM | POA: Diagnosis not present

## 2014-11-08 DIAGNOSIS — L89150 Pressure ulcer of sacral region, unstageable: Secondary | ICD-10-CM | POA: Diagnosis not present

## 2014-11-08 DIAGNOSIS — Z4800 Encounter for change or removal of nonsurgical wound dressing: Secondary | ICD-10-CM | POA: Diagnosis not present

## 2014-11-08 DIAGNOSIS — R32 Unspecified urinary incontinence: Secondary | ICD-10-CM | POA: Diagnosis not present

## 2014-11-08 MED ORDER — OXYCODONE-ACETAMINOPHEN 5-325 MG PO TABS: 1.0000 | ORAL_TABLET | Freq: Four times a day (QID) | ORAL | Status: DC | PRN

## 2014-11-08 NOTE — Telephone Encounter (Signed)
Pt attorney needs statement from dr burchette that the pt is in her right mind. Pt needs paper ASAP.

## 2014-11-08 NOTE — Telephone Encounter (Signed)
RX ready for pickup. Daughter is aware.

## 2014-11-08 NOTE — Telephone Encounter (Signed)
Directions and #.

## 2014-11-08 NOTE — Telephone Encounter (Signed)
She does have severe advanced osteoarthritis in the hip. She also has advanced sacral decubitus ulcer. I think oxycodone would be reasonable as her pain is increasing. I would however start at 5 mg/325 mg 1 every 6 hours as needed for pain #30 with no refill and then we will reassess

## 2014-11-08 NOTE — Telephone Encounter (Signed)
[name] Frank w/ [facility] called to request if you would reinstate pt's oxyCODONE-acetaminophen (PERCOCET) 10-325 MG per tablet   Pt has unstageable pressure ulcer to her coccyx. Pt is seeing the wound clinic. Daughter Enzo Bi is the person to call to pu [phone]

## 2014-11-10 NOTE — Telephone Encounter (Signed)
Who is requesting?  Back in March, we performed MMSE and she scored 23/30- so she definitely has some cognitive impairment.  I do not think we can accurately state that she is capable of making sound decisions on her own at this time.

## 2014-11-11 NOTE — Telephone Encounter (Signed)
Informed Magda Paganini that I could not give out any information because she is not on the patient DPR.

## 2014-11-12 DIAGNOSIS — I10 Essential (primary) hypertension: Secondary | ICD-10-CM | POA: Diagnosis not present

## 2014-11-12 DIAGNOSIS — M1990 Unspecified osteoarthritis, unspecified site: Secondary | ICD-10-CM | POA: Diagnosis not present

## 2014-11-12 DIAGNOSIS — N319 Neuromuscular dysfunction of bladder, unspecified: Secondary | ICD-10-CM | POA: Diagnosis not present

## 2014-11-12 DIAGNOSIS — R32 Unspecified urinary incontinence: Secondary | ICD-10-CM | POA: Diagnosis not present

## 2014-11-12 DIAGNOSIS — Z4800 Encounter for change or removal of nonsurgical wound dressing: Secondary | ICD-10-CM | POA: Diagnosis not present

## 2014-11-12 DIAGNOSIS — F419 Anxiety disorder, unspecified: Secondary | ICD-10-CM | POA: Diagnosis not present

## 2014-11-12 DIAGNOSIS — L89150 Pressure ulcer of sacral region, unstageable: Secondary | ICD-10-CM | POA: Diagnosis not present

## 2014-11-12 DIAGNOSIS — F0390 Unspecified dementia without behavioral disturbance: Secondary | ICD-10-CM | POA: Diagnosis not present

## 2014-11-13 ENCOUNTER — Encounter: Payer: Medicare Other | Admitting: Surgery

## 2014-11-13 DIAGNOSIS — L89150 Pressure ulcer of sacral region, unstageable: Secondary | ICD-10-CM | POA: Diagnosis not present

## 2014-11-13 DIAGNOSIS — F0390 Unspecified dementia without behavioral disturbance: Secondary | ICD-10-CM | POA: Diagnosis not present

## 2014-11-13 DIAGNOSIS — L89154 Pressure ulcer of sacral region, stage 4: Secondary | ICD-10-CM | POA: Diagnosis not present

## 2014-11-13 DIAGNOSIS — I10 Essential (primary) hypertension: Secondary | ICD-10-CM | POA: Diagnosis not present

## 2014-11-13 DIAGNOSIS — R32 Unspecified urinary incontinence: Secondary | ICD-10-CM | POA: Diagnosis not present

## 2014-11-13 DIAGNOSIS — M1990 Unspecified osteoarthritis, unspecified site: Secondary | ICD-10-CM | POA: Diagnosis not present

## 2014-11-14 ENCOUNTER — Ambulatory Visit: Payer: Medicare Other | Admitting: Family Medicine

## 2014-11-14 ENCOUNTER — Ambulatory Visit (INDEPENDENT_AMBULATORY_CARE_PROVIDER_SITE_OTHER): Payer: Medicare Other | Admitting: Family Medicine

## 2014-11-14 ENCOUNTER — Encounter: Payer: Self-pay | Admitting: Family Medicine

## 2014-11-14 ENCOUNTER — Encounter (HOSPITAL_COMMUNITY): Payer: Self-pay | Admitting: General Practice

## 2014-11-14 ENCOUNTER — Inpatient Hospital Stay (HOSPITAL_COMMUNITY)
Admission: AD | Admit: 2014-11-14 | Discharge: 2014-11-26 | DRG: 981 | Disposition: A | Discharge status: Skilled Nursing Facility | Payer: Medicare Other | Source: Ambulatory Visit | Attending: Internal Medicine | Admitting: Internal Medicine

## 2014-11-14 VITALS — BP 130/70 | HR 100 | Temp 97.8°F | Wt 114.0 lb

## 2014-11-14 DIAGNOSIS — R488 Other symbolic dysfunctions: Secondary | ICD-10-CM | POA: Diagnosis not present

## 2014-11-14 DIAGNOSIS — D649 Anemia, unspecified: Secondary | ICD-10-CM | POA: Diagnosis present

## 2014-11-14 DIAGNOSIS — E43 Unspecified severe protein-calorie malnutrition: Secondary | ICD-10-CM | POA: Diagnosis not present

## 2014-11-14 DIAGNOSIS — E46 Unspecified protein-calorie malnutrition: Secondary | ICD-10-CM | POA: Diagnosis not present

## 2014-11-14 DIAGNOSIS — M868X8 Other osteomyelitis, other site: Secondary | ICD-10-CM | POA: Diagnosis not present

## 2014-11-14 DIAGNOSIS — Y846 Urinary catheterization as the cause of abnormal reaction of the patient, or of later complication, without mention of misadventure at the time of the procedure: Secondary | ICD-10-CM | POA: Diagnosis not present

## 2014-11-14 DIAGNOSIS — Z886 Allergy status to analgesic agent status: Secondary | ICD-10-CM | POA: Diagnosis not present

## 2014-11-14 DIAGNOSIS — E785 Hyperlipidemia, unspecified: Secondary | ICD-10-CM | POA: Diagnosis present

## 2014-11-14 DIAGNOSIS — Z96641 Presence of right artificial hip joint: Secondary | ICD-10-CM | POA: Diagnosis present

## 2014-11-14 DIAGNOSIS — B9689 Other specified bacterial agents as the cause of diseases classified elsewhere: Secondary | ICD-10-CM | POA: Diagnosis not present

## 2014-11-14 DIAGNOSIS — Z7401 Bed confinement status: Secondary | ICD-10-CM

## 2014-11-14 DIAGNOSIS — Z9181 History of falling: Secondary | ICD-10-CM | POA: Diagnosis not present

## 2014-11-14 DIAGNOSIS — R233 Spontaneous ecchymoses: Secondary | ICD-10-CM | POA: Diagnosis not present

## 2014-11-14 DIAGNOSIS — R1319 Other dysphagia: Secondary | ICD-10-CM | POA: Diagnosis not present

## 2014-11-14 DIAGNOSIS — N39498 Other specified urinary incontinence: Secondary | ICD-10-CM | POA: Diagnosis present

## 2014-11-14 DIAGNOSIS — M1612 Unilateral primary osteoarthritis, left hip: Secondary | ICD-10-CM | POA: Diagnosis not present

## 2014-11-14 DIAGNOSIS — T8351XA Infection and inflammatory reaction due to indwelling urinary catheter, initial encounter: Secondary | ICD-10-CM | POA: Diagnosis not present

## 2014-11-14 DIAGNOSIS — N319 Neuromuscular dysfunction of bladder, unspecified: Secondary | ICD-10-CM | POA: Diagnosis not present

## 2014-11-14 DIAGNOSIS — K592 Neurogenic bowel, not elsewhere classified: Secondary | ICD-10-CM | POA: Diagnosis present

## 2014-11-14 DIAGNOSIS — T83511A Infection and inflammatory reaction due to indwelling urethral catheter, initial encounter: Secondary | ICD-10-CM

## 2014-11-14 DIAGNOSIS — B952 Enterococcus as the cause of diseases classified elsewhere: Secondary | ICD-10-CM | POA: Diagnosis not present

## 2014-11-14 DIAGNOSIS — N390 Urinary tract infection, site not specified: Secondary | ICD-10-CM

## 2014-11-14 DIAGNOSIS — R159 Full incontinence of feces: Secondary | ICD-10-CM | POA: Diagnosis not present

## 2014-11-14 DIAGNOSIS — L89159 Pressure ulcer of sacral region, unspecified stage: Secondary | ICD-10-CM | POA: Diagnosis not present

## 2014-11-14 DIAGNOSIS — Z79899 Other long term (current) drug therapy: Secondary | ICD-10-CM

## 2014-11-14 DIAGNOSIS — R5082 Postprocedural fever: Secondary | ICD-10-CM

## 2014-11-14 DIAGNOSIS — A498 Other bacterial infections of unspecified site: Secondary | ICD-10-CM | POA: Insufficient documentation

## 2014-11-14 DIAGNOSIS — L89154 Pressure ulcer of sacral region, stage 4: Secondary | ICD-10-CM

## 2014-11-14 DIAGNOSIS — Z515 Encounter for palliative care: Secondary | ICD-10-CM | POA: Diagnosis not present

## 2014-11-14 DIAGNOSIS — F0390 Unspecified dementia without behavioral disturbance: Secondary | ICD-10-CM | POA: Diagnosis present

## 2014-11-14 DIAGNOSIS — T8351XD Infection and inflammatory reaction due to indwelling urinary catheter, subsequent encounter: Secondary | ICD-10-CM | POA: Diagnosis not present

## 2014-11-14 DIAGNOSIS — R509 Fever, unspecified: Secondary | ICD-10-CM

## 2014-11-14 DIAGNOSIS — I10 Essential (primary) hypertension: Secondary | ICD-10-CM | POA: Diagnosis not present

## 2014-11-14 DIAGNOSIS — A4901 Methicillin susceptible Staphylococcus aureus infection, unspecified site: Secondary | ICD-10-CM

## 2014-11-14 DIAGNOSIS — Z681 Body mass index (BMI) 19 or less, adult: Secondary | ICD-10-CM | POA: Diagnosis not present

## 2014-11-14 DIAGNOSIS — Z933 Colostomy status: Secondary | ICD-10-CM | POA: Diagnosis not present

## 2014-11-14 DIAGNOSIS — R2689 Other abnormalities of gait and mobility: Secondary | ICD-10-CM | POA: Diagnosis not present

## 2014-11-14 DIAGNOSIS — R7881 Bacteremia: Secondary | ICD-10-CM

## 2014-11-14 DIAGNOSIS — M8618 Other acute osteomyelitis, other site: Secondary | ICD-10-CM | POA: Diagnosis not present

## 2014-11-14 DIAGNOSIS — K219 Gastro-esophageal reflux disease without esophagitis: Secondary | ICD-10-CM | POA: Diagnosis not present

## 2014-11-14 DIAGNOSIS — M1990 Unspecified osteoarthritis, unspecified site: Secondary | ICD-10-CM | POA: Diagnosis not present

## 2014-11-14 DIAGNOSIS — S31100A Unspecified open wound of abdominal wall, right upper quadrant without penetration into peritoneal cavity, initial encounter: Secondary | ICD-10-CM | POA: Diagnosis not present

## 2014-11-14 DIAGNOSIS — B9562 Methicillin resistant Staphylococcus aureus infection as the cause of diseases classified elsewhere: Secondary | ICD-10-CM | POA: Diagnosis present

## 2014-11-14 DIAGNOSIS — M869 Osteomyelitis, unspecified: Secondary | ICD-10-CM

## 2014-11-14 DIAGNOSIS — L8990 Pressure ulcer of unspecified site, unspecified stage: Secondary | ICD-10-CM

## 2014-11-14 DIAGNOSIS — M6281 Muscle weakness (generalized): Secondary | ICD-10-CM | POA: Diagnosis not present

## 2014-11-14 DIAGNOSIS — M4628 Osteomyelitis of vertebra, sacral and sacrococcygeal region: Secondary | ICD-10-CM | POA: Diagnosis not present

## 2014-11-14 DIAGNOSIS — Z960 Presence of urogenital implants: Secondary | ICD-10-CM | POA: Diagnosis not present

## 2014-11-14 DIAGNOSIS — R41841 Cognitive communication deficit: Secondary | ICD-10-CM | POA: Diagnosis not present

## 2014-11-14 DIAGNOSIS — Z87891 Personal history of nicotine dependence: Secondary | ICD-10-CM | POA: Diagnosis not present

## 2014-11-14 DIAGNOSIS — Z7951 Long term (current) use of inhaled steroids: Secondary | ICD-10-CM

## 2014-11-14 DIAGNOSIS — Z7189 Other specified counseling: Secondary | ICD-10-CM | POA: Diagnosis not present

## 2014-11-14 DIAGNOSIS — L8994 Pressure ulcer of unspecified site, stage 4: Secondary | ICD-10-CM | POA: Diagnosis not present

## 2014-11-14 DIAGNOSIS — Z885 Allergy status to narcotic agent status: Secondary | ICD-10-CM

## 2014-11-14 HISTORY — DX: Pressure ulcer of unspecified site, unspecified stage: L89.90

## 2014-11-14 HISTORY — DX: Unspecified dementia, unspecified severity, without behavioral disturbance, psychotic disturbance, mood disturbance, and anxiety: F03.90

## 2014-11-14 LAB — COMPREHENSIVE METABOLIC PANEL
ALBUMIN: 2.8 g/dL — AB (ref 3.5–5.0)
ALK PHOS: 57 U/L (ref 38–126)
ALT: 10 U/L — ABNORMAL LOW (ref 14–54)
ANION GAP: 10 (ref 5–15)
AST: 22 U/L (ref 15–41)
BUN: 8 mg/dL (ref 6–20)
CALCIUM: 8.3 mg/dL — AB (ref 8.9–10.3)
CHLORIDE: 102 mmol/L (ref 101–111)
CO2: 27 mmol/L (ref 22–32)
Creatinine, Ser: 0.91 mg/dL (ref 0.44–1.00)
GFR calc non Af Amer: 59 mL/min — ABNORMAL LOW (ref >60)
GLUCOSE: 125 mg/dL — AB (ref 65–99)
POTASSIUM: 3.2 mmol/L — AB (ref 3.5–5.1)
SODIUM: 139 mmol/L (ref 135–145)
Total Bilirubin: 0.5 mg/dL (ref 0.3–1.2)
Total Protein: 6.2 g/dL — ABNORMAL LOW (ref 6.5–8.1)

## 2014-11-14 LAB — CBC
HCT: 39 % (ref 36.0–46.0)
HEMOGLOBIN: 12.8 g/dL (ref 12.0–15.0)
MCH: 27.3 pg (ref 26.0–34.0)
MCHC: 32.8 g/dL (ref 30.0–36.0)
MCV: 83.2 fL (ref 78.0–100.0)
PLATELETS: 428 10*3/uL — AB (ref 150–400)
RBC: 4.69 MIL/uL (ref 3.87–5.11)
RDW: 14 % (ref 11.5–15.5)
WBC: 15.6 10*3/uL — ABNORMAL HIGH (ref 4.0–10.5)

## 2014-11-14 LAB — C-REACTIVE PROTEIN: CRP: 1.5 mg/dL — AB (ref <1.0)

## 2014-11-14 LAB — PROTIME-INR
INR: 1.29 (ref 0.00–1.49)
PROTHROMBIN TIME: 16.2 s — AB (ref 11.6–15.2)

## 2014-11-14 LAB — SEDIMENTATION RATE: SED RATE: 28 mm/h — AB (ref 0–22)

## 2014-11-14 LAB — LACTIC ACID, PLASMA: Lactic Acid, Venous: 2 mmol/L (ref 0.5–2.0)

## 2014-11-14 MED ORDER — ACETAMINOPHEN 325 MG PO TABS: 650.0000 mg | ORAL_TABLET | Freq: Four times a day (QID) | ORAL | Status: DC | PRN

## 2014-11-14 MED ORDER — OXYCODONE-ACETAMINOPHEN 5-325 MG PO TABS
1.0000 | ORAL_TABLET | Freq: Four times a day (QID) | ORAL | Status: DC | PRN
  Administered 2014-11-16 – 2014-11-24 (×3): 1 via ORAL
  Filled 2014-11-14 (×3): qty 1

## 2014-11-14 MED ORDER — ONDANSETRON HCL 4 MG PO TABS: 4.0000 mg | ORAL_TABLET | Freq: Four times a day (QID) | ORAL | Status: DC | PRN

## 2014-11-14 MED ORDER — ENSURE ENLIVE PO LIQD
237.0000 mL | Freq: Three times a day (TID) | ORAL | Status: DC
  Administered 2014-11-14 – 2014-11-19 (×10): 237 mL via ORAL

## 2014-11-14 MED ORDER — DONEPEZIL HCL 10 MG PO TABS
10.0000 mg | ORAL_TABLET | Freq: Every day | ORAL | Status: DC
Start: 2014-11-14 — End: 2014-11-26
  Administered 2014-11-14 – 2014-11-25 (×12): 10 mg via ORAL
  Filled 2014-11-14 (×12): qty 1

## 2014-11-14 MED ORDER — DOCUSATE SODIUM 100 MG PO CAPS
100.0000 mg | ORAL_CAPSULE | Freq: Two times a day (BID) | ORAL | Status: DC
  Administered 2014-11-14 – 2014-11-20 (×8): 100 mg via ORAL
  Filled 2014-11-14 (×10): qty 1

## 2014-11-14 MED ORDER — LOSARTAN POTASSIUM 50 MG PO TABS
50.0000 mg | ORAL_TABLET | Freq: Every day | ORAL | Status: DC
  Administered 2014-11-15 – 2014-11-26 (×11): 50 mg via ORAL
  Filled 2014-11-14 (×11): qty 1

## 2014-11-14 MED ORDER — SODIUM CHLORIDE 0.9 % IJ SOLN: 3.0000 mL | INTRAMUSCULAR | Status: DC | PRN

## 2014-11-14 MED ORDER — AMLODIPINE BESYLATE 5 MG PO TABS
5.0000 mg | ORAL_TABLET | Freq: Every day | ORAL | Status: DC
  Administered 2014-11-15 – 2014-11-17 (×3): 5 mg via ORAL
  Filled 2014-11-14 (×3): qty 1

## 2014-11-14 MED ORDER — SODIUM CHLORIDE 0.9 % IV SOLN
250.0000 mL | INTRAVENOUS | Status: DC | PRN
  Administered 2014-11-14: 10 mL via INTRAVENOUS

## 2014-11-14 MED ORDER — SENNOSIDES-DOCUSATE SODIUM 8.6-50 MG PO TABS: 1.0000 | ORAL_TABLET | Freq: Every evening | ORAL | Status: DC | PRN

## 2014-11-14 MED ORDER — MORPHINE SULFATE (PF) 2 MG/ML IV SOLN: 1.0000 mg | INTRAVENOUS | Status: DC | PRN

## 2014-11-14 MED ORDER — ONDANSETRON HCL 4 MG/2ML IJ SOLN: 4.0000 mg | Freq: Four times a day (QID) | INTRAMUSCULAR | Status: DC | PRN

## 2014-11-14 MED ORDER — SODIUM CHLORIDE 0.9 % IJ SOLN
3.0000 mL | Freq: Two times a day (BID) | INTRAMUSCULAR | Status: DC
  Administered 2014-11-15 – 2014-11-23 (×13): 3 mL via INTRAVENOUS

## 2014-11-14 MED ORDER — ENOXAPARIN SODIUM 40 MG/0.4ML ~~LOC~~ SOLN
40.0000 mg | SUBCUTANEOUS | Status: DC
  Administered 2014-11-14 – 2014-11-20 (×7): 40 mg via SUBCUTANEOUS
  Filled 2014-11-14 (×7): qty 0.4

## 2014-11-14 MED ORDER — ACETAMINOPHEN 650 MG RE SUPP: 650.0000 mg | Freq: Four times a day (QID) | RECTAL | Status: DC | PRN

## 2014-11-14 MED ORDER — ALUM & MAG HYDROXIDE-SIMETH 200-200-20 MG/5ML PO SUSP: 30.0000 mL | Freq: Four times a day (QID) | ORAL | Status: DC | PRN

## 2014-11-14 MED ORDER — AMOXICILLIN-POT CLAVULANATE 875-125 MG PO TABS
1.0000 | ORAL_TABLET | Freq: Two times a day (BID) | ORAL | Status: DC
  Administered 2014-11-14 – 2014-11-16 (×4): 1 via ORAL
  Filled 2014-11-14 (×4): qty 1

## 2014-11-14 MED ORDER — FUROSEMIDE 20 MG PO TABS: 20.0000 mg | ORAL_TABLET | Freq: Every day | ORAL | Status: DC

## 2014-11-14 NOTE — H&P (Signed)
[facility] History and Physical  [name] Frank PVX:[id number] DOB: Jul 28, 1934 DOA: 11/14/2014  Referring physician:  PCP: Eulas Post, MD   Chief Complaint: Stage IV decubitus ulcer  HPI: [name] Frank is a 80 y.o. female with a past medical history of stage IV decubitus ulcer, hypertension, dyslipidemia, cognitive impairment, who presents as a transfer from her primary care physician's office. She was receiving care at the wound care center at [facility]. It appears cultures done at that facility on 10/30/2014 grew Enterococcus avium  that was susceptible to ampicillin and was started on Augmentin therapy. Throughout this time she has denied having associated pain, fevers, chills, nausea, vomiting. At baseline family members tell me she is minimally active and requires assistance with activities of daily living. They state that she spends the majority or day sitting in her recliner or laying in bed. On 11/06/2014 x-ray revealed evidence of destruction within the distal sacrum and coccyx suspected to be secondary to infection. Radiology had recommended a further workup with CT or MRI. Her primary care physician recommended hospitalization to undergo workup and further treatment.                                                                Review of Systems:  Patient with history of dementia, could not obtain accurate review of systems.  Past Medical History  Diagnosis Date  . Allergy   . Hyperlipidemia   . Hypertension   . Anxiety   . GERD (gastroesophageal reflux disease)   . Urinary frequency   . Neurogenic urinary incontinence     since back surgery  . Arthritis     degenerative oateoarthritis  . Decubital ulcer 11/14/2014  . Dementia    Past Surgical History  Procedure Laterality Date  . Appendectomy    . Cholecystectomy    . Abdominal hysterectomy    . Back surgery  2000  . Colonoscopy w/ polypectomy    . Breast surgery  1993      biopsy  . Total hip arthroplasty  01/07/2012    Procedure: TOTAL HIP ARTHROPLASTY;  Surgeon: Yvette Rack., MD;  Location: [facility];  Service: Orthopedics;  Laterality: Right;   Social History:  reports that she quit smoking about 31 years ago. Her smoking use included Cigarettes. She has a 5 pack-year smoking history. She has never used smokeless tobacco. She reports that she does not drink alcohol or use illicit drugs.  Allergies  Allergen Reactions  . Codeine Sulfate     REACTION: stomach pains  . Tramadol Hcl     REACTION: stomach cramps    Family History  Problem Relation Age of Onset  . Diabetes Mother   . Alcohol abuse      fhx  . Arthritis      fhx  . Hypertension      fhx  . Heart disease      fhx  . Cancer      colon, lung, prostate/fhx  . Stroke      fhx  . Depression      fhx     Prior to Admission medications   Medication Sig Start Date End Date Taking? Authorizing Provider  megestrol (MEGACE ORAL) 40 MG/ML suspension One tsp po  bid 06/19/14  Yes Eulas Post, MD  amLODipine (NORVASC) 5 MG tablet TAKE 1 TABLET BY MOUTH EVERY DAY 06/27/14   Eulas Post, MD  amoxicillin-clavulanate (AUGMENTIN) 875-125 MG per tablet Take 1 tablet by mouth 2 (two) times daily. 11/07/14   Historical Provider, MD  azelastine (ASTELIN) 137 MCG/SPRAY nasal spray Place 1 spray into the nose 2 (two) times daily. Use in each nostril as directed Too expensive, $41.00 11/30/11   Eulas Post, MD  Calcium Carbonate-Vitamin D (CALTRATE 600+D) 600-400 MG-UNIT per tablet Take 1 tablet by mouth daily.      Historical Provider, MD  docusate sodium 100 MG CAPS Take 100 mg by mouth 2 (two) times daily. 01/09/12   Kirstin Shepperson, PA-C  donepezil (ARICEPT) 10 MG tablet Take 1 tablet (10 mg total) by mouth at bedtime. 07/19/14   Eulas Post, MD  fluticasone (FLONASE) 50 MCG/ACT nasal spray PLACE 2 SPRAYS INTO THE NOSE DAILY 09/07/13   Eulas Post, MD  furosemide (LASIX) 20  MG tablet TAKE 1 TABLET (20 MG TOTAL) BY MOUTH DAILY. 10/02/14   Eulas Post, MD  losartan (COZAAR) 50 MG tablet TAKE 1 TABLET BY MOUTH DAILY 01/14/14   Eulas Post, MD  oxyCODONE-acetaminophen (PERCOCET/ROXICET) 5-325 MG per tablet Take 1 tablet by mouth every 6 (six) hours as needed for severe pain. 11/08/14   Eulas Post, MD  triamcinolone cream (KENALOG) 0.1 % Apply 1 application topically 2 (two) times daily. 06/19/14   Eulas Post, MD  Wheat Dextrin (BENEFIBER) TABS Take by mouth.      Historical Provider, MD   Physical Exam: Filed Vitals:   11/14/14 1636  BP: 105/64  Pulse: 100  Temp: 98.5 F (36.9 C)  TempSrc: Oral  SpO2: 100%    Wt Readings from Last 3 Encounters:  11/14/14 51.71 kg (114 lb)  10/28/14 53.071 kg (117 lb)  10/10/14 52.164 kg (115 lb)    General:  Appears calm and comfortable, pleasantly confused, disoriented. She is in no acute distress  Eyes: PERRL, normal lids, irises & conjunctiva ENT: grossly normal hearing, lips & tongue Neck: no LAD, masses or thyromegaly Cardiovascular: RRR, no m/r/g. No LE edema. Telemetry: SR, no arrhythmias  Respiratory: CTA bilaterally, no w/r/r. Normal respiratory effort. Abdomen: soft, ntnd Skin: no rash or induration seen on limited exam Musculoskeletal: she has a stage IV decubitus ulcer measuring about 8 cm in length by 5 cm, some granulation tissue was present along with mild erythema along borders. I did not notice purulent drainage or malodor.  Psychiatrishe is confused and disoriented, no acute distress Neurologic: grossly non-focal.          Labs on Admission:  Basic Metabolic Panel: No results for input(s): NA, K, CL, CO2, GLUCOSE, BUN, CREATININE, CALCIUM, MG, PHOS in the last 168 hours. Liver Function Tests: No results for input(s): AST, ALT, ALKPHOS, BILITOT, PROT, ALBUMIN in the last 168 hours. No results for input(s): LIPASE, AMYLASE in the last 168 hours. No results for input(s):  AMMONIA in the last 168 hours. CBC: No results for input(s): WBC, NEUTROABS, HGB, HCT, MCV, PLT in the last 168 hours. Cardiac Enzymes: No results for input(s): CKTOTAL, CKMB, CKMBINDEX, TROPONINI in the last 168 hours.  BNP (last 3 results) No results for input(s): BNP in the last 8760 hours.  ProBNP (last 3 results)  Recent Labs  09/03/14 0925  PROBNP 37.0    CBG: No results for input(s): GLUCAP in the last  168 hours.  Radiological Exams on Admission: No results found.  EKG: Independently reviewed.   Assessment/Plan Principal Problem:   Decubitus ulcer of sacral region, stage 4 Active Problems:   Essential hypertension   Osteoarthritis   Protein-calorie malnutrition   Stage IV decubitus ulcer   1. Stage IV decubitus ulcer. Patient having a history of dementia, poor functional status at baseline, minimally mobile having a stage IV decubitus ulcer. She had an x-ray done on 11/06/2014 that showed findings suggestive of destruction of distal sacrum and coccyx. A wound culture was obtained on 10/30/2014 that grew Enterococcus avium, susceptibility showed organism sensitive to ampicillin and linezolid. She was treated with Augmentin. On exam I did not note  malodor or purulence. For now will continue Augmentin therapy. Will further workup with a CT scan of abdomen and pelvis to further assess x-ray findings. Otherwise she is nontoxic on presentation, afebrile, having stable vitals. Will check a comp has metabolic panel along with a CBC, ESR and CRP. Consult wound care.  2.  Hypertension. Blood pressure stable on presentation, will continue 50 mg of Cozaar and 5 mg of amlodipine. Monitor blood pressures.  3. Protein calorie malnutrition.  Patient having a weight of 51.7 kg, having by lateral muscle atrophy. Will provide protein boost for nutritional support. 4. Dementia. At baseline patient required assistance with activities of daily living having a poor functional status. Will  consult physical therapy and occupational therapy. May benefit from rehabilitation at skilled nursing facility. 5.   DVT prophylaxis per Lovenox     Code Status: CODE STATUS discussed with family members she is a full code DVT Prophylaxis: Lovenox  Family Communication: I spoke with her husband and son present at bedside Disposition Plan: Anticipate she may require greater than 2 nights hospitalization limit to inpatient service   Time spent: 20 minutes   Kelvin Cellar [facility] Pager [phone]

## 2014-11-14 NOTE — Progress Notes (Signed)
DEVEN, Frank ([id number]) Visit Report for 11/13/2014 Arrival Information Details Patient Name: [name] Frank Date of Service: 11/13/2014 10:15 AM Medical Record Patient Account Number: [id number] Number: Treating RN: Clover Mealy, RN, BSN, Rita 1934/03/30 [phone] y.o. Other Clinician: Date of Birth/Sex: Female) Treating BURNS III, Primary Care Physician: Evelena Peat Physician/Extender: Zollie Beckers Referring Physician: Eulah Citizen in Treatment: 2 Visit Information History Since Last Visit Any new allergies or adverse reactions: No Patient Arrived: [place] Mor Had a fall or experienced change in No Arrival Time: 10:11 activities of daily living that may affect Accompanied By: dtr risk of falls: Transfer Assistance: None Signs or symptoms of abuse/neglect since last No Patient Identification Verified: Yes visito Secondary Verification Process Completed: Yes Hospitalized since last visit: No Has Dressing in Place as Prescribed: Yes Pain Present Now: No Electronic Signature(s) Signed: 11/13/2014 4:19:05 PM By: Elpidio Eric BSN, RN Entered By: Elpidio Eric on 11/13/2014 10:11:51 [name] Frank ([id number]) -------------------------------------------------------------------------------- Encounter Discharge Information Details Patient Name: [name] Frank Date of Service: 11/13/2014 10:15 AM Medical Record Patient Account Number: [id number] Number: Treating RN: Clover Mealy, RN, BSN, Rita [birth year] [phone] y.o. Other Clinician: Date of Birth/Sex: Female) Treating BURNS III, Primary Care Physician: Evelena Peat Physician/Extender: Zollie Beckers Referring Physician: Eulah Citizen in Treatment: 2 Encounter Discharge Information Items Discharge Pain Level: 0 Discharge Condition: Stable Ambulatory Status: Ambulatory Discharge Destination: Home Transportation: Private Auto Accompanied By: dtr Schedule Follow-up Appointment: No Medication  Reconciliation completed and provided to Patient/Care No [name] Frank: Provided on Clinical Summary of Care: 11/13/2014 Form Type Recipient Paper Patient ST Electronic Signature(s) Signed: 11/13/2014 10:48:19 AM By: Gwenlyn Perking Entered By: Gwenlyn Perking on 11/13/2014 10:48:19 [name] Frank ([id number]) -------------------------------------------------------------------------------- Lower Extremity Assessment Details Patient Name: [name] Frank Date of Service: 11/13/2014 10:15 AM Medical Record Patient Account Number: [id number] Number: Treating RN: Clover Mealy, RN, BSN, Rita 1934-08-28 [phone] y.o. Other Clinician: Date of Birth/Sex: Female) Treating BURNS III, Primary Care Physician: Evelena Peat Physician/Extender: Zollie Beckers Referring Physician: Evelena Peat Weeks in Treatment: 2 Electronic Signature(s) Signed: 11/13/2014 4:19:05 PM By: Elpidio Eric BSN, RN Entered By: Elpidio Eric on 11/13/2014 10:14:00 [name] Frank ([id number]) -------------------------------------------------------------------------------- Multi Wound Chart Details Patient Name: [name] Frank Date of Service: 11/13/2014 10:15 AM Medical Record Patient Account Number: [id number] Number: Treating RN: Clover Mealy, RN, BSN, Rita 01-16-1935 [phone] y.o. Other Clinician: Date of Birth/Sex: Female) Treating BURNS III, Primary Care Physician: Evelena Peat Physician/Extender: Zollie Beckers Referring Physician: Eulah Citizen in Treatment: 2 Vital Signs Height(in): 66 Pulse(bpm): 86 Weight(lbs): 117 Blood Pressure 133/70 (mmHg): Body Mass Index(BMI): 19 Temperature(F): 97.6 Respiratory Rate 16 (breaths/Frank): Photos: [1:No Photos] [N/A:N/A] Wound Location: [1:Right Sacrum] [N/A:N/A] Wounding Event: [1:Gradually Appeared] [N/A:N/A] Primary Etiology: [1:Pressure Ulcer] [N/A:N/A] Comorbid History: [1:Hypertension, Osteoarthritis] [N/A:N/A] Date Acquired: [1:10/14/2014]  [N/A:N/A] Weeks of Treatment: [1:2] [N/A:N/A] Wound Status: [1:Open] [N/A:N/A] Measurements L x W x D 6x4x2 [N/A:N/A] (cm) Area (cm) : [1:18.85] [N/A:N/A] Volume (cm) : [1:37.699] [N/A:N/A] % Reduction in Area: [1:-60.00%] [N/A:N/A] % Reduction in Volume: -1500.10% [N/A:N/A] Classification: [1:Unstageable/Unclassified] [N/A:N/A] Exudate Amount: [1:Large] [N/A:N/A] Exudate Type: [1:Purulent] [N/A:N/A] Exudate Color: [1:yellow, brown, green] [N/A:N/A] Foul Odor After [1:Yes] [N/A:N/A] Cleansing: Odor Anticipated Due to No [N/A:N/A] Product Use: Wound Margin: [1:Flat and Intact] [N/A:N/A] Granulation Amount: [1:Small (1-33%)] [N/A:N/A] Necrotic Amount: [1:Medium (34-66%)] [N/A:N/A] Necrotic Tissue: [1:Eschar] [N/A:N/A] Exposed Structures: [N/A:N/A] Fascia: No Fat: No Tendon: No Muscle: No Joint: No Bone: No Limited to Skin Breakdown Epithelialization: None N/A N/A Periwound Skin Texture: Edema: No N/A N/A Excoriation: No Induration:  No Callus: No Crepitus: No Fluctuance: No Friable: No Rash: No Scarring: No Periwound Skin Moist: Yes N/A N/A Moisture: Maceration: No Dry/Scaly: No Periwound Skin Color: Atrophie Blanche: No N/A N/A Cyanosis: No Ecchymosis: No Erythema: No Hemosiderin Staining: No Mottled: No Pallor: No Rubor: No Temperature: No Abnormality N/A N/A Tenderness on Yes N/A N/A Palpation: Wound Preparation: Ulcer Cleansing: N/A N/A Rinsed/Irrigated with Saline Topical Anesthetic Applied: Other: lidocaine 4% Treatment Notes Electronic Signature(s) Signed: 11/13/2014 4:19:05 PM By: Elpidio Eric BSN, RN Entered By: Elpidio Eric on 11/13/2014 10:33:30 [name] Frank ([id number]) -------------------------------------------------------------------------------- Multi-Disciplinary Care Plan Details Patient Name: [name] Frank Date of Service: 11/13/2014 10:15 AM Medical Record Patient Account Number:  [id number] Number: Treating RN: Clover Mealy, RN, BSN, Rita [birth year] [phone] y.o. Other Clinician: Date of Birth/Sex: Female) Treating BURNS III, Primary Care Physician: Evelena Peat Physician/Extender: Zollie Beckers Referring Physician: Eulah Citizen in Treatment: 2 Active Inactive Abuse / Safety / Falls / Self Care Management Nursing Diagnoses: Impaired physical mobility Potential for falls Goals: Patient will remain injury free Date Initiated: 10/30/2014 Goal Status: Active Interventions: Assess fall risk on admission and as needed Notes: Orientation to the Wound Care Program Nursing Diagnoses: Knowledge deficit related to the wound healing center program Goals: Patient/caregiver will verbalize understanding of the Wound Healing Center Program Date Initiated: 10/30/2014 Goal Status: Active Interventions: Provide education on orientation to the wound center Notes: Pressure Nursing Diagnoses: Knowledge deficit related to causes and risk factors for pressure ulcer development Potential for impaired tissue integrity related to pressure, friction, moisture, and shear [name], Frank ([id number]) Goals: Patient will remain free from development of additional pressure ulcers Date Initiated: 10/30/2014 Goal Status: Active Patient/caregiver will verbalize understanding of pressure ulcer management Date Initiated: 10/30/2014 Goal Status: Active Interventions: Assess: immobility, friction, shearing, incontinence upon admission and as needed Assess potential for pressure ulcer upon admission and as needed Provide education on pressure ulcers Notes: Wound/Skin Impairment Nursing Diagnoses: Impaired tissue integrity Goals: Ulcer/skin breakdown will have a volume reduction of 30% by week 4 Date Initiated: 10/30/2014 Goal Status: Active Ulcer/skin breakdown will have a volume reduction of 50% by week 8 Date Initiated: 10/30/2014 Goal Status: Active Ulcer/skin  breakdown will have a volume reduction of 80% by week 12 Date Initiated: 10/30/2014 Goal Status: Active Ulcer/skin breakdown will heal within 14 weeks Date Initiated: 10/30/2014 Goal Status: Active Interventions: Assess patient/caregiver ability to obtain necessary supplies Assess ulceration(s) every visit Notes: Electronic Signature(s) Signed: 11/13/2014 4:19:05 PM By: Elpidio Eric BSN, RN Entered By: Elpidio Eric on 11/13/2014 10:21:23 [name], Frank ([id number] Frank ([id number]) -------------------------------------------------------------------------------- Pain Assessment Details Patient Name: [name] Frank Date of Service: 11/13/2014 10:15 AM Medical Record Patient Account Number: [id number] Number: Treating RN: Clover Mealy, RN, BSN, Rita [birth year] [phone] y.o. Other Clinician: Date of Birth/Sex: Female) Treating BURNS III, Primary Care Physician: Evelena Peat Physician/Extender: Zollie Beckers Referring Physician: Eulah Citizen in Treatment: 2 Active Problems Location of Pain Severity and Description of Pain Patient Has Paino No Site Locations Pain Management and Medication Current Pain Management: Electronic Signature(s) Signed: 11/13/2014 4:19:05 PM By: Elpidio Eric BSN, RN Entered By: Elpidio Eric on 11/13/2014 10:12:02 [name] Frank ([id number]) -------------------------------------------------------------------------------- Patient/Caregiver Education Details Patient Name: [name] Frank Date of Service: 11/13/2014 10:15 AM Medical Record Patient Account Number: [id number] Number: Treating RN: Clover Mealy, RN, BSN, Rita 1934/07/16 [phone] y.o. Other Clinician: Date of Birth/Gender: Female) Treating BURNS III, Primary Care Physician: Evelena Peat Physician/Extender: Zollie Beckers Referring Physician: Eulah Citizen in Treatment:  2 Education Assessment Education Provided To: Caregiver Education Topics  Provided Basic Hygiene: Methods: Explain/Verbal Responses: State content correctly Pressure: Methods: Explain/Verbal Responses: State content correctly Welcome To The [facility]: Methods: Explain/Verbal Responses: State content correctly Electronic Signature(s) Signed: 11/13/2014 4:19:05 PM By: Elpidio Eric BSN, RN Entered By: Elpidio Eric on 11/13/2014 10:45:07 [name] Frank ([id number]) -------------------------------------------------------------------------------- Wound Assessment Details Patient Name: [name] Frank Date of Service: 11/13/2014 10:15 AM Medical Record Patient Account Number: [id number] Number: Treating RN: Clover Mealy, RN, BSN, Rita Aug 24, 1934 [phone] y.o. Other Clinician: Date of Birth/Sex: Female) Treating BURNS III, Primary Care Physician: Evelena Peat Physician/Extender: Zollie Beckers Referring Physician: Eulah Citizen in Treatment: 2 Wound Status Wound Number: 1 Primary Etiology: Pressure Ulcer Wound Location: Right Sacrum Wound Status: Open Wounding Event: Gradually Appeared Comorbid History: Hypertension, Osteoarthritis Date Acquired: 10/14/2014 Weeks Of Treatment: 2 Clustered Wound: No Photos Photo Uploaded By: Elpidio Eric on 11/13/2014 15:50:50 Wound Measurements Length: (cm) 6 Width: (cm) 4 Depth: (cm) 2 Area: (cm) 18.85 Volume: (cm) 37.699 % Reduction in Area: -60% % Reduction in Volume: -1500.1% Epithelialization: None Tunneling: No Undermining: No Wound Description Classification: Unstageable/Unclassified Wound Margin: Flat and Intact Exudate Amount: Large Exudate Type: Purulent Exudate Color: yellow, brown, green Foul Odor After Cleansing: Yes Due to Product Use: No Wound Bed Granulation Amount: Small (1-33%) Exposed Structure Necrotic Amount: Medium (34-66%) Fascia Exposed: No Necrotic Quality: Eschar Fat Layer Exposed: No [name], Frank ([id number]) Tendon Exposed: No Muscle Exposed:  No Joint Exposed: No Bone Exposed: No Limited to Skin Breakdown Periwound Skin Texture Texture Color No Abnormalities Noted: No No Abnormalities Noted: No Callus: No Atrophie Blanche: No Crepitus: No Cyanosis: No Excoriation: No Ecchymosis: No Fluctuance: No Erythema: No Friable: No Hemosiderin Staining: No Induration: No Mottled: No Localized Edema: No Pallor: No Rash: No Rubor: No Scarring: No Temperature / Pain Moisture Temperature: No Abnormality No Abnormalities Noted: No Tenderness on Palpation: Yes Dry / Scaly: No Maceration: No Moist: Yes Wound Preparation Ulcer Cleansing: Rinsed/Irrigated with Saline Topical Anesthetic Applied: Other: lidocaine 4%, Treatment Notes Wound #1 (Right Sacrum) 1. Cleansed with: Clean wound with Normal Saline 2. Anesthetic Topical Lidocaine 4% cream to wound bed prior to debridement 3. Peri-wound Care: Skin Prep 4. Dressing Applied: Aquacel Ag 5. Secondary Dressing Applied Bordered Foam Dressing Electronic Signature(s) Signed: 11/13/2014 4:19:05 PM By: Elpidio Eric BSN, RN Entered By: Elpidio Eric on 11/13/2014 10:21:14 [name] Frank ([id number]) -------------------------------------------------------------------------------- Vitals Details Patient Name: [name] Frank Date of Service: 11/13/2014 10:15 AM Medical Record Patient Account Number: [id number] Number: Treating RN: Clover Mealy, RN, BSN, Rita 1934-08-24 [phone] y.o. Other Clinician: Date of Birth/Sex: Female) Treating BURNS III, Primary Care Physician: Evelena Peat Physician/Extender: Zollie Beckers Referring Physician: Eulah Citizen in Treatment: 2 Vital Signs Time Taken: 10:13 Temperature (F): 97.6 Height (in): 66 Pulse (bpm): 86 Weight (lbs): 117 Respiratory Rate (breaths/Frank): 16 Body Mass Index (BMI): 18.9 Blood Pressure (mmHg): 133/70 Reference Range: 80 - 120 mg / dl Electronic Signature(s) Signed: 11/13/2014 4:19:05 PM By:  Elpidio Eric BSN, RN Entered By: Elpidio Eric on 11/13/2014 10:13:49

## 2014-11-14 NOTE — Progress Notes (Signed)
Pre visit review using our clinic review tool, if applicable. No additional management support is needed unless otherwise documented below in the visit note. 

## 2014-11-14 NOTE — Progress Notes (Signed)
[name] Frank, [name] Frank ([id number]) Visit Report for 11/13/2014 Chief Complaint Document Details Patient Name: [name] Frank, [name] Frank Date of Service: 11/13/2014 10:15 AM Medical Record Patient Account Number: [id number] Number: Treating RN: Clover Mealy, RN, BSN, [name] 04-19-1934 [phone] y.o. Other Clinician: Date of Birth/Sex: Female) Treating BURNS III, Primary Care Physician/Extender: Justine Null, Smitty Cords Physician: Referring Physician: Eulah Citizen in Treatment: 2 Information Obtained from: Patient Chief Complaint Sacral pressure ulcer. Electronic Signature(s) Signed: 11/13/2014 10:59:09 AM By: Madelaine Bhat MD Entered By: Madelaine Bhat on 11/13/2014 10:52:49 [name] Frank ([id number]) -------------------------------------------------------------------------------- Debridement Details Patient Name: [name] Frank Date of Service: 11/13/2014 10:15 AM Medical Record Patient Account Number: [id number] Number: Treating RN: Clover Mealy, RN, BSN, [name] Mar 25, 1934 [phone] y.o. Other Clinician: Date of Birth/Sex: Female) Treating BURNS III, Primary Care Physician/Extender: Justine Null, Smitty Cords Physician: Referring Physician: Eulah Citizen in Treatment: 2 Debridement Performed for Wound #1 Right Sacrum Assessment: Performed By: Physician BURNS III, Melanie Crazier., MD Debridement: Open Wound/Selective Debridement Selective Description: Pre-procedure Yes Verification/Time Out Taken: Start Time: 10:40 Pain Control: Lidocaine 4% Topical Solution Level: Non-Viable Tissue Total Area Debrided (L x 6 (cm) x 4 (cm) = 24 (cm) W): Tissue and other Non-Viable, Eschar, Fibrin/Slough, Subcutaneous material debrided: Instrument: Forceps, Scissors Bleeding: None End Time: 10:41 Procedural Pain: 0 Post Procedural Pain: 0 Response to Treatment: Procedure was tolerated well Post Debridement Measurements of Total Wound Length: (cm) 6 Stage:  Category/Stage IV Width: (cm) 4 Depth: (cm) 2 Volume: (cm) 37.699 Post Procedure Diagnosis Same as Pre-procedure Electronic Signature(s) Signed: 11/13/2014 10:59:09 AM By: Madelaine Bhat MD Signed: 11/13/2014 4:19:05 PM By: Elpidio Eric BSN, RN Entered By: Madelaine Bhat on 11/13/2014 10:52:42 [name] Frank, [name] Frank ([id number]) [name] Frank, [name] Frank ([id number]) -------------------------------------------------------------------------------- HPI Details Patient Name: [name] Frank Date of Service: 11/13/2014 10:15 AM Medical Record Patient Account Number: [id number] Number: Treating RN: Clover Mealy, RN, BSN, [name] 06-05-1934 [phone] y.o. Other Clinician: Date of Birth/Sex: Female) Treating BURNS III, Primary Care Physician/Extender: Justine Null, Smitty Cords Physician: Referring Physician: Eulah Citizen in Treatment: 2 History of Present Illness HPI Description: Pleasant 80 year old with history of dementia, urinary incontinence, and osteoarthritis. No diabetes. She noted a sacral pressure ulceration in early August 2016. She lives with her [place] in [place], [place]. She is ambulatory but spends most of the day in a chair. No support surfaces. No prior history of pressure ulcerations. Tolerating a regular diet. Supplements with ensure. Biopsy culture 10/30/2014 grew enterococcus avium sensitive to ampicillin. Patient is taking augmentin. X-ray 11/06/2014 showed probable destruction at the distal sacrum and coccyx. CT or MRI recommended. CT ordered. She returns to clinic for follow-up and is without new complaints. Has not received support surfaces. She denies any significant pain. Moderate drainage. Occasionally malodorous. No fever or chills. Her daughter is interested in admitting her to a skilled nursing facility. Electronic Signature(s) Signed: 11/13/2014 10:59:09 AM By: Madelaine Bhat MD Entered By: Madelaine Bhat on 11/13/2014  10:55:18 [name] Frank ([id number]) -------------------------------------------------------------------------------- Physical Exam Details Patient Name: [name] Frank Date of Service: 11/13/2014 10:15 AM Medical Record Patient Account Number: [id number] Number: Treating RN: Clover Mealy, RN, BSN, [name] Dec 29, 1934 [phone] y.o. Other Clinician: Date of Birth/Sex: Female) Treating BURNS III, Primary Care Physician/Extender: Justine Null, Smitty Cords Physician: Referring Physician: Evelena Peat Weeks in Treatment: 2 Constitutional . Pulse regular. Respirations normal and unlabored. Afebrile. . Notes Extensive sacral pressure ulcer improved. Less soft tissue necrosis. Debrided to healthy granulating base. Exposed fascia and periosteum. No  significant cellulitis. Electronic Signature(s) Signed: 11/13/2014 10:59:09 AM By: Madelaine Bhat MD Entered By: Madelaine Bhat on 11/13/2014 10:56:23 [name] Frank ([id number]) -------------------------------------------------------------------------------- Physician Orders Details Patient Name: [name] Frank Date of Service: 11/13/2014 10:15 AM Medical Record Patient Account Number: [id number] Number: Treating RN: Clover Mealy, RN, BSN, [name] [birth year] [phone] y.o. Other Clinician: Date of Birth/Sex: Female) Treating BURNS III, Primary Care Physician/Extender: Justine Null, Smitty Cords Physician: Referring Physician: Eulah Citizen in Treatment: 2 Verbal / Phone Orders: Yes Clinician: Afful, RN, BSN, [name] Read Back and Verified: Yes Diagnosis Coding Wound Cleansing Wound #1 Right Sacrum o Clean wound with Normal Saline. Anesthetic Wound #1 Right Sacrum o Topical Lidocaine 4% cream applied to wound bed prior to debridement Skin Barriers/Peri-Wound Care Wound #1 Right Sacrum o Skin Prep Primary Wound Dressing Wound #1 Right Sacrum o Aquacel Ag Secondary Dressing Wound #1 Right  Sacrum o Boardered Foam Dressing Dressing Change Frequency Wound #1 Right Sacrum o Change dressing every day. Follow-up Appointments Wound #1 Right Sacrum o Return Appointment in 1 week. Off-Loading Wound #1 Right Sacrum o Gel wheelchair cushion - HHRN to order for patient o Turn and reposition every 2 hours [name] Frank, RIGA. ([id number]) o Mattress - HHRN to order air mattress or appropriate gel overlay for patient's bed for patient Home Health Wound #1 Right Sacrum o Continue Home Health Visits - [facility] - PLEASE PROVIDE WOUND CARE SUPPLIES FOR PATIENT OR ASSIST HER IN ORDERING THEM FROM [facility] (SHE IS ALREADY SET UP WITH THEM). ALSO, PLEASE ORDER GEL OVERLAY FOR BED AND CHAIR AND LET WOUND CARE CENTER KNOW IF ANY INFO IS NEEDED FOR THIS [phone] o Home Health Nurse may visit [facility] to address patientos wound care needs. o FACE TO FACE ENCOUNTER: MEDICARE and MEDICAID PATIENTS: I certify that this patient is under my care and that I had a face-to-face encounter that meets the physician face-to-face encounter requirements with this patient on this date. The encounter with the patient was in whole or in part for the following MEDICAL CONDITION: (primary reason for Home Healthcare) MEDICAL NECESSITY: I certify, that based on my findings, NURSING services are a medically necessary home health service. HOME BOUND STATUS: I certify that my clinical findings support that this patient is homebound (i.e., Due to illness or injury, pt requires aid of supportive devices such as crutches, cane, wheelchairs, walkers, the use of special transportation or the assistance of another person to leave their place of residence. There is a normal inability to leave the home and doing so requires considerable and taxing effort. Other absences are for medical reasons / religious services and are infrequent or of short duration when for other reasons). o If current dressing  causes regression in wound condition, may D/C ordered dressing product/s and apply Normal Saline Moist Dressing daily until next Wound Healing Center / Other MD appointment. Notify Wound Healing Center of regression in wound condition at [phone]. o Please direct any NON-WOUND related issues/requests for orders to patient's Primary Care Physician Medications-please add to medication list. Wound #1 Right Sacrum o P.O. Antibiotics - continue augmentin Electronic Signature(s) Signed: 11/13/2014 10:59:09 AM By: Madelaine Bhat MD Signed: 11/13/2014 4:19:05 PM By: Elpidio Eric BSN, RN Entered By: Elpidio Eric on 11/13/2014 10:42:22 MALAIKA, ARNALL ([id number]) -------------------------------------------------------------------------------- Problem List Details Patient Name: [name] Frank Date of Service: 11/13/2014 10:15 AM Medical Record Patient Account Number: [id number] Number: Treating RN: Clover Mealy, RN, BSN, [name] [birth year] [phone] y.o. Other Clinician:  Date of Birth/Sex: Female) Treating BURNS III, Primary Care Physician/Extender: Justine Null, Smitty Cords Physician: Referring Physician: Eulah Citizen in Treatment: 2 Active Problems ICD-10 Encounter Code Description Active Date Diagnosis L89.154 Pressure ulcer of sacral region, stage 4 10/30/2014 Yes F03.90 Unspecified dementia without behavioral disturbance 10/30/2014 Yes R32 Unspecified urinary incontinence 10/30/2014 Yes Inactive Problems Resolved Problems Electronic Signature(s) Signed: 11/13/2014 10:59:09 AM By: Madelaine Bhat MD Entered By: Madelaine Bhat on 11/13/2014 10:52:18 [name] Frank ([id number]) -------------------------------------------------------------------------------- Progress Note Details Patient Name: [name] Frank Date of Service: 11/13/2014 10:15 AM Medical Record Patient Account Number: [id number] Number: Treating RN: Clover Mealy, RN, BSN,  [name] 28-Feb-1935 [phone] y.o. Other Clinician: Date of Birth/Sex: Female) Treating BURNS III, Primary Care Physician/Extender: Justine Null, Smitty Cords Physician: Referring Physician: Eulah Citizen in Treatment: 2 Subjective Chief Complaint Information obtained from Patient Sacral pressure ulcer. History of Present Illness (HPI) Pleasant 80 year old with history of dementia, urinary incontinence, and osteoarthritis. No diabetes. She noted a sacral pressure ulceration in early August 2016. She lives with her husband in [place], [place]. She is ambulatory but spends most of the day in a chair. No support surfaces. No prior history of pressure ulcerations. Tolerating a regular diet. Supplements with ensure. Biopsy culture 10/30/2014 grew enterococcus avium sensitive to ampicillin. Patient is taking augmentin. X-ray 11/06/2014 showed probable destruction at the distal sacrum and coccyx. CT or MRI recommended. CT ordered. She returns to clinic for follow-up and is without new complaints. Has not received support surfaces. She denies any significant pain. Moderate drainage. Occasionally malodorous. No fever or chills. Her daughter is interested in admitting her to a skilled nursing facility. Objective Constitutional Pulse regular. Respirations normal and unlabored. Afebrile. Vitals Time Taken: 10:13 AM, Height: 66 in, Weight: 117 lbs, BMI: 18.9, Temperature: 97.6 F, Pulse: 86 bpm, Respiratory Rate: 16 breaths/min, Blood Pressure: 133/70 mmHg. General Notes: Extensive sacral pressure ulcer improved. Less soft tissue necrosis. Debrided to healthy granulating base. Exposed fascia and periosteum. No significant cellulitis. [name] Frank, BRACHER. ([id number]) Integumentary (Hair, Skin) Wound #1 status is Open. Original cause of wound was Gradually Appeared. The wound is located on the Right Sacrum. The wound measures 6cm length x 4cm width x 2cm depth; 18.85cm^2 area  and 37.699cm^3 volume. The wound is limited to skin breakdown. There is no tunneling or undermining noted. There is a large amount of purulent drainage noted. The wound margin is flat and intact. There is small (1-33%) granulation within the wound bed. There is a medium (34-66%) amount of necrotic tissue within the wound bed including Eschar. The periwound skin appearance exhibited: Moist. The periwound skin appearance did not exhibit: Callus, Crepitus, Excoriation, Fluctuance, Friable, Induration, Localized Edema, Rash, Scarring, Dry/Scaly, Maceration, Atrophie Blanche, Cyanosis, Ecchymosis, Hemosiderin Staining, Mottled, Pallor, Rubor, Erythema. Periwound temperature was noted as No Abnormality. The periwound has tenderness on palpation. Assessment Active Problems ICD-10 L89.154 - Pressure ulcer of sacral region, stage 4 F03.90 - Unspecified dementia without behavioral disturbance R32 - Unspecified urinary incontinence Sacral pressure ulcer, stage IV. Possible osteomyelitis per x-ray (CT pending). Procedures Wound #1 Wound #1 is a Pressure Ulcer located on the Right Sacrum . There was a Non-Viable Tissue Open Wound/Selective [phone]) debridement with total area of 24 sq cm performed by BURNS III, Melanie Crazier., MD. with the following instrument(s): Forceps and Scissors to remove Non-Viable tissue/material including Fibrin/Slough, Eschar, and Subcutaneous after achieving pain control using Lidocaine 4% Topical Solution. A time out was conducted prior to the start of the procedure.  There was no bleeding. The procedure was tolerated well with a pain level of 0 throughout and a pain level of 0 following the procedure. Post Debridement Measurements: 6cm length x 4cm width x 2cm depth; 37.699cm^3 volume. Post debridement Stage noted as Category/Stage IV. Post procedure Diagnosis Wound #1: Same as Pre-Procedure [name] Frank, BRATCHER. ([id number]) Plan Wound Cleansing: Wound #1 Right  Sacrum: Clean wound with Normal Saline. Anesthetic: Wound #1 Right Sacrum: Topical Lidocaine 4% cream applied to wound bed prior to debridement Skin Barriers/Peri-Wound Care: Wound #1 Right Sacrum: Skin Prep Primary Wound Dressing: Wound #1 Right Sacrum: Aquacel Ag Secondary Dressing: Wound #1 Right Sacrum: Boardered Foam Dressing Dressing Change Frequency: Wound #1 Right Sacrum: Change dressing every day. Follow-up Appointments: Wound #1 Right Sacrum: Return Appointment in 1 week. Off-Loading: Wound #1 Right Sacrum: Gel wheelchair cushion - HHRN to order for patient Turn and reposition every 2 hours Mattress - HHRN to order air mattress or appropriate gel overlay for patient's bed for patient Home Health: Wound #1 Right Sacrum: Continue Home Health Visits - [facility] - PLEASE PROVIDE WOUND CARE SUPPLIES FOR PATIENT OR ASSIST HER IN ORDERING THEM FROM [facility] (SHE IS ALREADY SET UP WITH THEM). ALSO, PLEASE ORDER GEL OVERLAY FOR BED AND CHAIR AND LET WOUND CARE CENTER KNOW IF ANY INFO IS NEEDED FOR THIS [phone] Home Health Nurse may visit [facility] to address patient s wound care needs. FACE TO FACE ENCOUNTER: MEDICARE and MEDICAID PATIENTS: I certify that this patient is under my care and that I had a face-to-face encounter that meets the physician face-to-face encounter requirements with this patient on this date. The encounter with the patient was in whole or in part for the following MEDICAL CONDITION: (primary reason for Home Healthcare) MEDICAL NECESSITY: I certify, that based on my findings, NURSING services are a medically necessary home health service. HOME BOUND STATUS: I certify that my clinical findings support that this patient is homebound (i.e., Due to illness or injury, pt requires aid of supportive devices such as crutches, cane, wheelchairs, walkers, the use of special transportation or the assistance of another person to leave their place of residence.  There is a normal inability to leave the home and doing so requires considerable and taxing effort. Other absences are for medical reasons / religious services and are infrequent or of short duration when for other reasons). If current dressing causes regression in wound condition, may D/C ordered dressing product/s and apply Normal Saline Moist Dressing daily until next Wound Healing Center / Other MD appointment. Notify Wound Healing Center of regression in wound condition at [phone]. Please direct any NON-WOUND related issues/requests for orders to patient's Primary Care Physician [name] Frank, [name] Frank Hannah. ([id number]) Medications-please add to medication list.: Wound #1 Right Sacrum: P.O. Antibiotics - continue augmentin Continue with silver alginate dressing changes. Will consider wound VAC. CT scan to assess for osteomyelitis. Continue Augmentin. Continue offloading measures. Reorder support surfaces. The patient's husband and daughter are interested in her being admitted to a skilled nursing facility, which I think is very reasonable. Electronic Signature(s) Signed: 11/13/2014 10:59:09 AM By: Madelaine Bhat MD Entered By: Madelaine Bhat on 11/13/2014 10:58:29 [name] Frank ([id number]) -------------------------------------------------------------------------------- SuperBill Details Patient Name: [name] Frank Date of Service: 11/13/2014 Medical Record Patient Account Number: [id number] Number: Treating RN: Clover Mealy RN, BSN, [name] 23-Jun-1934 [phone] y.o. Other Clinician: Date of Birth/Sex: Female) Treating BURNS III, Primary Care Physician/Extender: Justine Null, Smitty Cords Physician: Weeks in Treatment: 2 Referring Physician:  BURCHETTE, BRUCE Diagnosis Coding ICD-10 Codes Code Description L89.154 Pressure ulcer of sacral region, stage 4 F03.90 Unspecified dementia without behavioral disturbance R32 Unspecified urinary incontinence Facility  Procedures CPT4 Code: 16109604 Description: [phone] - DEBRIDE WOUND 1ST 20 SQ CM OR < ICD-10 Description Diagnosis L89.154 Pressure ulcer of sacral region, stage 4 Modifier: Quantity: 1 CPT4 Code: 11914782 Description: 97598 - DEBRIDE WOUND EA ADDL 20 SQ CM ICD-10 Description Diagnosis L89.154 Pressure ulcer of sacral region, stage 4 Modifier: Quantity: 1 Physician Procedures CPT4 Code: 9562130 Description: 97597 - WC PHYS DEBR WO ANESTH 20 SQ CM ICD-10 Description Diagnosis L89.154 Pressure ulcer of sacral region, stage 4 Modifier: Quantity: 1 CPT4 Code: 8657846 Description: 97598 - WC PHYS DEBR WO ANESTH EA ADD 20 CM ICD-10 Description Diagnosis L89.154 Pressure ulcer of sacral region, stage 4 Modifier: Quantity: 1 Electronic Signature(s) [name] Frank, [name] Frank ([id number]) Signed: 11/13/2014 10:59:09 AM By: Madelaine Bhat MD Entered By: Madelaine Bhat on 11/13/2014 10:58:43

## 2014-11-14 NOTE — Progress Notes (Signed)
Subjective:    Patient ID: [name] Frank, female    DOB: 1934/06/23, 80 y.o.   MRN: [id number]  HPI Patient has chronic problems of hyperlipidemia, hypertension, urinary urgency, osteoarthritis, GERD, cognitive impairment, and failure to thrive. She was seen recently and we noted large sacral decubitus ulcer. She was referred to wound care center and hyperbaric Center at [facility]. She had wound culture that grew enterococcus avium sensitive to ampicillin. She has been on Augmentin over the past couple of weeks. She's had a total of 3 debridements including yesterday most recently and there have been discussions with family about possible admission and eventual referral to skilled nursing facility for further care. Plain x-rays were taken on 8/24 which showed probable destruction involving the distal sacrum and coccyx. Recommendation was for CT or MRI to further assessed and she has not had these yet. Apparently CT scan has been ordered. She has not had any fevers or chills. She does have a history of urine incontinence. She has decreased mobility for several months. No history of diabetes. Nonsmoker  no prior history of known decubitus ulcers  Past Medical History  Diagnosis Date  . Allergy   . Hyperlipidemia   . Hypertension   . Anxiety   . GERD (gastroesophageal reflux disease)   . Urinary frequency   . Neurogenic urinary incontinence     since back surgery  . Arthritis     degenerative oateoarthritis   Past Surgical History  Procedure Laterality Date  . Appendectomy    . Cholecystectomy    . Abdominal hysterectomy    . Back surgery  2000  . Colonoscopy w/ polypectomy    . Breast surgery  1993    biopsy  . Total hip arthroplasty  01/07/2012    Procedure: TOTAL HIP ARTHROPLASTY;  Surgeon: Thera Flake., MD;  Location: [facility] OR;  Service: Orthopedics;  Laterality: Right;    reports that she quit smoking about 31 years ago. Her smoking use included Cigarettes. She has a  5 pack-year smoking history. She does not have any smokeless tobacco history on file. She reports that she does not drink alcohol or use illicit drugs. family history includes Alcohol abuse in an other family member; Arthritis in an other family member; Cancer in an other family member; Depression in an other family member; Diabetes in her mother; Heart disease in an other family member; Hypertension in an other family member; Stroke in an other family member. Allergies  Allergen Reactions  . Codeine Sulfate     REACTION: stomach pains  . Tramadol Hcl     REACTION: stomach cramps      Review of Systems  Constitutional: Negative for fever and chills.  Respiratory: Negative for shortness of breath.   Cardiovascular: Negative for chest pain.  Gastrointestinal: Negative for nausea, vomiting and diarrhea.  Genitourinary: Negative for dysuria.  Psychiatric/Behavioral: Negative for agitation.       Objective:   Physical Exam  Constitutional: She appears well-developed and well-nourished.  Cardiovascular: Normal rate and regular rhythm.   Pulmonary/Chest: Effort normal and breath sounds normal. No respiratory distress. She has no wheezes. She has no rales.  Musculoskeletal: She exhibits no edema.  Neurological: She is alert.  Skin:  Very large and deep sacral decubitus ulcer. Minimal necrotic tissue (was apparently just debrided yesterday) No foul odor noted          Assessment & Plan:  Large sacral decubitus ulcer very likely stage IV with plain x-rays  worrisome for destructive osteomyelitis involving the distal sacrum and coccyx. Wound culture positive for enterococcus avium susceptible to ampicillin. We discussed at length with family. She's been getting treatments through the wound care hyperbaric Center at [facility] with 3 prior debridements. Family interested in skilled nursing facility. We've recommended consideration for admission, CT to further assess and  surgical  consult and they agree.Marland Kitchen

## 2014-11-14 NOTE — Patient Instructions (Signed)
Continue with Augmentin Follow-up immediately for any fever or change of symptoms We are in the process of trying to get her admitted for further intervention

## 2014-11-15 ENCOUNTER — Inpatient Hospital Stay (HOSPITAL_COMMUNITY): Payer: Medicare Other

## 2014-11-15 DIAGNOSIS — E43 Unspecified severe protein-calorie malnutrition: Secondary | ICD-10-CM | POA: Insufficient documentation

## 2014-11-15 LAB — URINALYSIS, ROUTINE W REFLEX MICROSCOPIC
BILIRUBIN URINE: NEGATIVE
GLUCOSE, UA: NEGATIVE mg/dL
HGB URINE DIPSTICK: NEGATIVE
KETONES UR: 15 mg/dL — AB
Nitrite: NEGATIVE
PROTEIN: NEGATIVE mg/dL
Specific Gravity, Urine: 1.01 (ref 1.005–1.030)
UROBILINOGEN UA: 0.2 mg/dL (ref 0.0–1.0)
pH: 5.5 (ref 5.0–8.0)

## 2014-11-15 LAB — BASIC METABOLIC PANEL
ANION GAP: 8 (ref 5–15)
BUN: 6 mg/dL (ref 6–20)
CALCIUM: 8.1 mg/dL — AB (ref 8.9–10.3)
CHLORIDE: 105 mmol/L (ref 101–111)
CO2: 25 mmol/L (ref 22–32)
CREATININE: 0.77 mg/dL (ref 0.44–1.00)
GFR calc non Af Amer: >60 mL/min (ref >60)
GLUCOSE: 82 mg/dL (ref 65–99)
Potassium: 3 mmol/L — ABNORMAL LOW (ref 3.5–5.1)
Sodium: 138 mmol/L (ref 135–145)

## 2014-11-15 LAB — CBC
HCT: 33 % — ABNORMAL LOW (ref 36.0–46.0)
HEMOGLOBIN: 11.2 g/dL — AB (ref 12.0–15.0)
MCH: 28.1 pg (ref 26.0–34.0)
MCHC: 33.9 g/dL (ref 30.0–36.0)
MCV: 82.7 fL (ref 78.0–100.0)
Platelets: 357 10*3/uL (ref 150–400)
RBC: 3.99 MIL/uL (ref 3.87–5.11)
RDW: 14.2 % (ref 11.5–15.5)
WBC: 12.7 10*3/uL — ABNORMAL HIGH (ref 4.0–10.5)

## 2014-11-15 LAB — URINE MICROSCOPIC-ADD ON

## 2014-11-15 MED ORDER — ADULT MULTIVITAMIN W/MINERALS CH
1.0000 | ORAL_TABLET | Freq: Every day | ORAL | Status: DC
  Administered 2014-11-15 – 2014-11-26 (×11): 1 via ORAL
  Filled 2014-11-15 (×11): qty 1

## 2014-11-15 MED ORDER — IOHEXOL 300 MG/ML  SOLN
100.0000 mL | Freq: Once | INTRAMUSCULAR | Status: AC | PRN
  Administered 2014-11-15: 100 mL via INTRAVENOUS

## 2014-11-15 MED ORDER — GADOBENATE DIMEGLUMINE 529 MG/ML IV SOLN
10.0000 mL | Freq: Once | INTRAVENOUS | Status: AC | PRN
  Administered 2014-11-15: 10 mL via INTRAVENOUS

## 2014-11-15 NOTE — Evaluation (Signed)
Physical Therapy Evaluation Patient Details Name: [name] Frank MRN: [id number] DOB: 20-Sep-1934 Today's Date: 11/15/2014   History of Present Illness  sacral decubitus ulcer  Clinical Impression  Patient with noted confusion during session, not oriented to place, year or month. No family present to confirm mobility and home situation described by patient. Currently needing moderate assistance with sit-stand. Ambulation was performed with rw and min guard assist. No loss of balance during ambulation. Recommending 24 hour supervision for home and rw for mobility.     Follow Up Recommendations Other (comment);Supervision/Assistance - 24 hour (uncertain supervision and assist available at home)    Equipment Recommendations  Rolling walker with 5" wheels    Recommendations for Other Services       Precautions / Restrictions Precautions Precautions: Fall Restrictions Weight Bearing Restrictions: No      Mobility  Bed Mobility               General bed mobility comments: found and returned to chair  Transfers Overall transfer level: Needs assistance Equipment used: Rolling walker (2 wheeled) Transfers: Sit to/from Stand Sit to Stand: Mod assist         General transfer comment: significant posterior lean with sit-stand.   Ambulation/Gait Ambulation/Gait assistance: Min guard Ambulation Distance (Feet): 150 Feet Assistive device: Rolling walker (2 wheeled) Gait Pattern/deviations: Step-through pattern Gait velocity: decreased   General Gait Details: no loss of balance with ambulation  Stairs            Wheelchair Mobility    Modified Rankin (Stroke Patients Only)       Balance Overall balance assessment: Needs assistance Sitting-balance support: No upper extremity supported Sitting balance-Leahy Scale: Fair     Standing balance support: Bilateral upper extremity supported Standing balance-Leahy Scale: Poor Standing balance comment: posterior  leaning with standing.                              Pertinent Vitals/Pain Pain Assessment: No/denies pain    Home Living Family/patient expects to be discharged to:: Private residence Living Arrangements: Spouse/significant other Available Help at Discharge: Family Type of Home: House Home Access: Stairs to enter Entrance Stairs-Rails: Right Entrance Stairs-Number of Steps: 8 Home Layout: One level Home Equipment: Cane - single point Additional Comments: Patient with noted confusion, uncertain reliability of home living details. All per patient report, no family available    Prior Function Level of Independence: Independent with assistive device(s)         Comments: reports using a cane for ambulation.  (per patient report)     Hand Dominance        Extremity/Trunk Assessment               Lower Extremity Assessment: Generalized weakness         Communication   Communication: No difficulties  Cognition Arousal/Alertness: Awake/alert Behavior During Therapy: WFL for tasks assessed/performed Overall Cognitive Status: No family/caregiver present to determine baseline cognitive functioning (noted history of cognitive impairments)                      General Comments      Exercises        Assessment/Plan    PT Assessment Patient needs continued PT services  PT Diagnosis Difficulty walking;Generalized weakness   PT Problem List Decreased strength;Decreased activity tolerance;Decreased balance;Decreased mobility;Decreased cognition;Decreased safety awareness  PT Treatment Interventions DME instruction;Gait training;Stair training;Functional mobility training;Therapeutic  activities;Therapeutic exercise;Balance training;Patient/family education   PT Goals (Current goals can be found in the Care Plan section) Acute Rehab PT Goals Patient Stated Goal: go home PT Goal Formulation: With patient Time For Goal Achievement:  11/29/14 Potential to Achieve Goals: Fair    Frequency Min 3X/week   Barriers to discharge        Co-evaluation               End of Session Equipment Utilized During Treatment: Gait belt Activity Tolerance: Patient tolerated treatment well;No increased pain Patient left: in chair;with call bell/phone within reach;with chair alarm set Nurse Communication: Mobility status         Time: 1610-9604 PT Time Calculation (min) (ACUTE ONLY): 22 min   Charges:   PT Evaluation $Initial PT Evaluation Tier I: 1 Procedure     PT G Codes:        Christiane Ha, PT, CSCS Pager [phone] Office [phone]  11/15/2014, 12:43 PM

## 2014-11-15 NOTE — Progress Notes (Signed)
[facility] PROGRESS NOTE  [name] Frank ZOX:[id number] DOB: 1934/07/24 DOA: 11/14/2014 PCP: Kristian Covey, MD  Assessment/Plan: Principal Problem:   Decubitus ulcer of sacral region, stage 4 - obtain MRI of pelvis to assess for osteomyelitis as this may change antibiotic requirements. - continue augmentin - reassess wbc levels next am  Active Problems:   Essential hypertension - amlodipine and losartan on board    Osteoarthritis - stable    Protein-calorie malnutrition - continue nutritional supplementation   Code Status: full Family Communication: discussed with patient and family member.  Disposition Plan: Pending improvement in condition   Consultants:  None  Procedures:  None  Antibiotics:  augmentin  HPI/Subjective: Pt has no new complaints. No acute issues overnight. Denies any metal implants  Objective: Filed Vitals:   11/15/14 1326  BP: 119/67  Pulse: 93  Temp: 98.9 F (37.2 C)  Resp: 18    Intake/Output Summary (Last 24 hours) at 11/15/14 1500 Last data filed at 11/15/14 0910  Gross per 24 hour  Intake    702 ml  Output      0 ml  Net    702 ml   There were no vitals filed for this visit.  Exam:   General:  Pt in nad,alert and awake  Cardiovascular: no cyanosis  Respiratory: no increased wob, no wheezes  Abdomen: nd, no guarding  Musculoskeletal: equal tone BL   Data Reviewed: Basic Metabolic Panel:  Recent Labs Lab 11/14/14 1903 11/15/14 0306  NA 139 138  K 3.2* 3.0*  CL 102 105  CO2 27 25  GLUCOSE 125* 82  BUN 8 6  CREATININE 0.91 0.77  CALCIUM 8.3* 8.1*   Liver Function Tests:  Recent Labs Lab 11/14/14 1903  AST 22  ALT 10*  ALKPHOS 57  BILITOT 0.5  PROT 6.2*  ALBUMIN 2.8*   No results for input(s): LIPASE, AMYLASE in the last 168 hours. No results for input(s): AMMONIA in the last 168 hours. CBC:  Recent Labs Lab 11/14/14 1903 11/15/14 0306  WBC 15.6* 12.7*  HGB 12.8 11.2*  HCT  39.0 33.0*  MCV 83.2 82.7  PLT 428* 357   Cardiac Enzymes: No results for input(s): CKTOTAL, CKMB, CKMBINDEX, TROPONINI in the last 168 hours. BNP (last 3 results) No results for input(s): BNP in the last 8760 hours.  ProBNP (last 3 results)  Recent Labs  09/03/14 0925  PROBNP 37.0    CBG: No results for input(s): GLUCAP in the last 168 hours.  No results found for this or any previous visit (from the past 240 hour(s)).   Studies: Ct Abdomen Pelvis W Contrast  11/15/2014   CLINICAL DATA:  Stage IV decubitus wound. Appendectomy. Cholecystectomy. Hysterectomy.  EXAM: CT ABDOMEN AND PELVIS WITH CONTRAST  TECHNIQUE: Multidetector CT imaging of the abdomen and pelvis was performed using the standard protocol following bolus administration of intravenous contrast.  CONTRAST:  OMNIPAQUE IOHEXOL 300 MG/ML  SOLN  COMPARISON:  09/18/2012 and the plain films of 11/06/2014.  FINDINGS: Lower chest: Motion degradation involving the lung bases. Clear lung bases. Normal heart size without pericardial or pleural effusion.  Hepatobiliary: Minimal exclusion of hepatic dome. Scattered hepatic cysts. Cholecystectomy, without biliary ductal dilatation.  Pancreas: Pancreatic duct upper normal, similar. No obstructive mass or evidence of acute pancreatitis.  Spleen: Normal  Adrenals/Urinary Tract: Normal right adrenal gland. Left adrenal thickening is mild. Bilateral renal cysts. Suspect a lower pole right renal collecting system stone or stones. Too small to characterize lesions  in both kidneys. No hydronephrosis. Degraded evaluation of the pelvis, secondary to beam hardening artifact from right hip arthroplasty. Grossly normal urinary bladder.  Stomach/Bowel: Proximal gastric underdistention. Large colonic stool burden. Bowel not well opacified with enteric contrast. No small bowel distension.  Vascular/Lymphatic: Advanced aortic and branch vessel atherosclerosis. No abdominal and no gross pelvic adenopathy.   Reproductive: Hysterectomy.  No adnexal mass.  Other: No significant free fluid.  Moderate pelvic floor laxity.  Musculoskeletal: Decubitus ulcer, with soft tissue thickening and gas superficial to the coccyx, including on image 67. No gross osseous destruction identified. Osteopenia. Left hip osteoarthritis. Right hip arthroplasty.  IMPRESSION: 1. Decubitus ulcer superficial to the coccyx. No gross underlying osseous destruction. Given the depth of decubitus ulcer, osteomyelitis remains a concern. The test of choice for evaluating is pre and post-contrast pelvic MRI. 2.  No acute process in the abdomen or pelvis. 3.  Possible constipation. 4. Motion and beam hardening artifact degradation. 5. Advanced atherosclerosis. 6. Possible right nephrolithiasis.   Electronically Signed   By: Jeronimo Greaves M.D.   On: 11/15/2014 08:24    Scheduled Meds: . amLODipine  5 mg Oral Daily  . amoxicillin-clavulanate  1 tablet Oral BID  . docusate sodium  100 mg Oral BID  . donepezil  10 mg Oral QHS  . enoxaparin (LOVENOX) injection  40 mg Subcutaneous Q24H  . feeding supplement (ENSURE ENLIVE)  237 mL Oral TID BM  . losartan  50 mg Oral Daily  . multivitamin with minerals  1 tablet Oral Daily  . sodium chloride  3 mL Intravenous Q12H   Continuous Infusions:   Time spent: > 35 minutes  Penny Pia  [facility] Pager [phone]. If 7PM-7AM, please contact night-coverage at [email or web address], password [facility] 11/15/2014, 3:00 PM  LOS: 1 day

## 2014-11-15 NOTE — Care Management Note (Addendum)
Case Management Note  Patient Details  Name: [name] Frank MRN: [id number] Date of Birth: 1934-08-10  Subjective/Objective:                    Action/Plan:  Discussed discharge planning with patient, grandsons at bedside and daughter in law Magda Paganini ( caregiver ) via speaker phone. Patient already active with [facility] and would like to remain with [facility] . Awaiting MRI results regarding need for IV antibiotics .  Ordered Pressure reducing chair cushion to chair when OOB, from [facility] .Spoke with Magda Paganini again , requesting wheelchair, same ordered    Patient has 2 walkers at home.  Expected Discharge Date:                  Expected Discharge Plan:  Home w Home Health Services  In-House Referral:     Discharge planning Services  CM Consult  Post Acute Care Choice:    Choice offered to:  Adult Children  DME Arranged:    DME Agency:     HH Arranged:  RN, PT, OT, Nurse's Aide HH Agency:  [facility]  Status of Service:  In process, will continue to follow  Medicare Important Message Given:    Date Medicare IM Given:    Medicare IM give by:    Date Additional Medicare IM Given:    Additional Medicare Important Message give by:     If discussed at Long Length of Stay Meetings, dates discussed:    Additional Comments:  Kingsley Plan, RN 11/15/2014, 2:31 PM

## 2014-11-15 NOTE — Consult Note (Addendum)
WOC wound consult note Reason for Consult: Consult requested for sacral pressure ulcer. CT scan indicates possible osteomyelitis.  This complex medical condition  is beyond [facility] scope of practice; please consult surgical or ortho team for further input. Wound type: Stage 4 Pressure Ulcer POA: Yes Measurement:4X3.5X1cm with tunneling to 1 cm at 6:00 o'clock, bone palpable. Wound bed: Red and moist Drainage (amount, consistency, odor) Mod amt pink drainage, no odor Periwound: White and macerated with moisture associated skin damage from previous drainage. Dressing procedure/placement/frequency: Aquacel to absorb drainage and provide antimicrobial benefits.  Pressure reducing chair cushion to chair when OOB. Optimize nutrition.  Please re-consult if further assistance is needed.  Thank-you,  Cammie Mcgee MSN, RN, CWOCN, [place], CNS [phone]

## 2014-11-15 NOTE — Evaluation (Signed)
Occupational Therapy Evaluation Patient Details Name: [name] Frank MRN: [id number] DOB: June 07, 1934 Today's Date: 11/15/2014    History of Present Illness sacral decubitus ulcer   Clinical Impression   Pt presented with significant confusion; disoriented to place, time and situation. Husband and son present during evaluation. Son reports that pt has had a decline in cognition over the past few years but more rapidly over the past 4 months. Husband reports that prior to pts hospitalization, she required max assistance for ADL activities and required supervision for functional mobility. Husband also reports that pt is incontinent at bowel and bladder. Husband stated that he would like to see pt go to SNF upon d/c from hospital. Currently pt needs min A for sit > stand tsf and functional mobility, and total A for toilet hygiene. Continued OT services recommended to address deficits in toilet tsf and functional mobility required for participation in ADL axs. Recommending 24 hour supervision with placement in SNF.     Follow Up Recommendations  SNF    Equipment Recommendations   (None recommended at this time)    Recommendations for Other Services  None at this time      Precautions / Restrictions Precautions Precautions: Fall Restrictions Weight Bearing Restrictions: No      Mobility Bed Mobility  General bed mobility comments: pt found in recliner, returned to recliner at end of session  Transfers Overall transfer level: Needs assistance Equipment used: 1 person hand held assist Transfers: Sit to/from Stand Sit to Stand: Min assist;Mod assist;+2 physical assistance   General transfer comment: significant posterior lean with sit-stand.     Balance Overall balance assessment: Needs assistance Sitting-balance support: No upper extremity supported Sitting balance-Leahy Scale: Fair     Standing balance support: Bilateral upper extremity supported Standing  balance-Leahy Scale: Poor Standing balance comment: posterior leaning with standing.            ADL Overall ADL's : At baseline;Needs assistance/impaired         Upper Body Bathing: Total assistance;Maximal assistance Upper Body Bathing Details (indicate cue type and reason): Per pts husband report, pt is requires max-total assist for ADLs Lower Body Bathing: Total assistance;Maximal assistance Lower Body Bathing Details (indicate cue type and reason): Per pts husband report, pt is requires max-total assist for ADLs Upper Body Dressing : Total assistance;Maximal assistance Upper Body Dressing Details (indicate cue type and reason): Per pts husband report, pt is requires max-total assist for ADLs Lower Body Dressing: Total assistance;Maximal assistance Lower Body Dressing Details (indicate cue type and reason): Per pts husband report, pt is requires max-total assist for ADLs Toilet Transfer: Minimal assistance;Cueing for safety;Cueing for sequencing;Ambulation;With caregiver independent assisting Toilet Transfer Details (indicate cue type and reason): Min A for sit > stand  Toileting- Clothing Manipulation and Hygiene: Total assistance Toileting - Clothing Manipulation Details (indicate cue type and reason): Husband reports that pt is incontinent at bladder and bowel. Pt required total assist for toilet hygiene during session. Tub/ Shower Transfer: Minimal assistance   Functional mobility during ADLs: Minimal assistance;Cueing for sequencing;Cueing for safety General ADL Comments: Pts husband reports that he was providing max A to pt for ADLs prior to hospitalization.               Pertinent Vitals/Pain Pain Assessment: No/denies pain     Extremity/Trunk Assessment Upper Extremity Assessment Upper Extremity Assessment: Overall WFL for tasks assessed   Lower Extremity Assessment Lower Extremity Assessment: Generalized weakness   Cervical / Trunk Assessment Cervical /  Trunk  Assessment: Normal   Communication Communication Communication: No difficulties   Cognition Arousal/Alertness: Awake/alert Behavior During Therapy: WFL for tasks assessed/performed Overall Cognitive Status: History of cognitive impairments - at baseline (Pts son reports decline in cognition over past 4 months)       Memory: Decreased recall of precautions;Decreased short-term memory                        Home Living Family/patient expects to be discharged to:: Skilled nursing facility Living Arrangements: Spouse/significant other Available Help at Discharge: Family Type of Home: House Home Access: Stairs to enter Secretary/administrator of Steps: 8 Entrance Stairs-Rails: Right Home Layout: One level       Home Equipment: Cane - single point   Additional Comments: Family reports they want pt to go to SNF      Prior Functioning/Environment Level of Independence: Needs assistance  Gait / Transfers Assistance Needed: Husband reports pt was supervision prior to hospitalization ADL's / Homemaking Assistance Needed: Spouse reports having to assist pt with all ADLs for the past ~3 years (max A). Spouse reports pt is incontinent of bladder and bowel.    Comments: Family confirms pts use of cane to ambulate PTA    OT Diagnosis: Generalized weakness;Cognitive deficits;Altered mental status   OT Problem List: Decreased strength;Decreased activity tolerance;Impaired balance (sitting and/or standing);Decreased cognition;Decreased safety awareness;Decreased knowledge of use of DME or AE;Decreased knowledge of precautions   OT Treatment/Interventions: Self-care/ADL training;Therapeutic activities;Patient/family education    OT Goals(Current goals can be found in the care plan section) Acute Rehab OT Goals Patient Stated Goal: go home ADL Goals Pt Will Transfer to Toilet: with supervision;ambulating Additional ADL Goal #1: Pt will be supervision - min guard for functional  mobility needed for participation in ADL activities and functional transfers upon d/c  OT Frequency: Min 2X/week    End of Session Nurse Communication: Other (comment) (Pt had small bowel movement, pt needed bandage replaced)  Activity Tolerance: Patient tolerated treatment well Patient left: in chair;with call bell/phone within reach;with chair alarm set;with family/visitor present   Time: 1531-1555 OT Time Calculation (min): 24 min Charges:  OT General Charges $OT Visit: 1 Procedure OT Evaluation $Initial OT Evaluation Tier I: 1 Procedure OT Treatments $Self Care/Home Management : 8-22 mins G-Codes:    Gaye Alken, MS, OTR/L 11/15/2014, 4:30 PM [phone]

## 2014-11-15 NOTE — Progress Notes (Addendum)
Brief Nutrition Follow-Up Note  RD received consult for wound healing. Reviewed COWRN note from ealier today- pt with stage IV sacral wound with osteomyelitis.   RD saw pt earlier this morning (refer to initial assessment on 11/15/14 for further details); pt already has interventions in place (Ensure Enlive BID and MVI). RD will follow for efficacy of interventions.   Will continue to follow.   Natalea Sutliff A. Mayford Knife, RD, LDN, CDE Pager: [phone] After hours Pager: [phone]

## 2014-11-15 NOTE — Progress Notes (Addendum)
Initial Nutrition Assessment  DOCUMENTATION CODES:   Severe malnutrition in context of chronic illness, Underweight  INTERVENTION:   -Ensure Enlive po BID, each supplement provides 350 kcal and 20 grams of protein -MVI daily  NUTRITION DIAGNOSIS:   Malnutrition related to chronic illness as evidenced by severe depletion of body fat, severe depletion of muscle mass.  GOAL:   Patient will meet greater than or equal to 90% of their needs  MONITOR:   PO intake, Supplement acceptance, Labs, Weight trends, Skin, I & O's  REASON FOR ASSESSMENT:   Malnutrition Screening Tool    ASSESSMENT:   [name] Frank is a 81 y.o. female with a past medical history of stage IV decubitus ulcer, hypertension, dyslipidemia, cognitive impairment, who presents as a transfer from her primary care physician's office. She was receiving care at the wound care center at [facility]. It appears cultures done at that facility on 10/30/2014 grew Enterococcus avium that was susceptible to ampicillin and was started on Augmentin therapy. Throughout this time she has denied having associated pain, fevers, chills, nausea, vomiting. At baseline family members tell me she is minimally active and requires assistance with activities of daily living. They state that she spends the majority or day sitting in her recliner or laying in bed. On 11/06/2014 x-ray revealed evidence of destruction within the distal sacrum and coccyx suspected to be secondary to infection. Radiology had recommended a further workup with CT or MRI. Her primary care physician recommended hospitalization to undergo workup and further treatment.  Pt admitted for stage IV sacral decubitus ulcer.   Spoke with pt at bedside. She confirms that she is followed for the wound center for her sacral wound.   She confirms poor appetite and weight loss, however, reveals that this is improving. She consumed 100% of her breakfast this  morning and just finished drinking an Ensure. She reports she recently started drinking 1-2 Ensures per day PTA.   She reveals UBW of 142#, but is unable to provide amount or time frame for weight loss. Noted wt loss trend for > 1 year, including a 10% wt loss over the past year.   Nutrition-Focused physical exam completed. Findings are severe fat depletion, severe muscle depletion, and severe edema.   Discussed importance of optimizing nutritional status to help support wound healing. Encouraged continued good meal intake and consuming Ensure here and at home. Pt very appreciative of visit.   Labs reviewed: K: 3.0.   Diet Order:  Diet Heart Room service appropriate?: Yes; Fluid consistency:: Thin  Skin:  Wound (see comment) (stage IV buttocks)  Last BM:  11/13/14  Height:   Ht Readings from Last 1 Encounters:  11/15/14  (1.702 m)    Weight:   Wt Readings from Last 1 Encounters:  11/14/14 114 lb (51.71 kg)    Ideal Body Weight:  61.4 kg  BMI:  Estimated body mass index is 17.85 kg/(m^2) as calculated from the following:   Height as of this encounter:  (1.702 m).   Weight as of an earlier encounter on 11/14/14: 114 lb (51.71 kg).  Estimated Nutritional Needs:   Kcal:  1600-1800  Protein:  70-80 grams  Fluid:  >1.6-1.8 L  EDUCATION NEEDS:   Education needs addressed  Parv Manthey A. Mayford Knife, RD, LDN, CDE Pager: [phone] After hours Pager: [phone]

## 2014-11-16 DIAGNOSIS — F0390 Unspecified dementia without behavioral disturbance: Secondary | ICD-10-CM

## 2014-11-16 DIAGNOSIS — M869 Osteomyelitis, unspecified: Secondary | ICD-10-CM | POA: Insufficient documentation

## 2014-11-16 DIAGNOSIS — B952 Enterococcus as the cause of diseases classified elsewhere: Secondary | ICD-10-CM

## 2014-11-16 DIAGNOSIS — Z9889 Other specified postprocedural states: Secondary | ICD-10-CM

## 2014-11-16 DIAGNOSIS — M8618 Other acute osteomyelitis, other site: Secondary | ICD-10-CM

## 2014-11-16 DIAGNOSIS — Z7189 Other specified counseling: Secondary | ICD-10-CM | POA: Insufficient documentation

## 2014-11-16 LAB — CBC
HCT: 33.1 % — ABNORMAL LOW (ref 36.0–46.0)
HEMOGLOBIN: 11.1 g/dL — AB (ref 12.0–15.0)
MCH: 28.1 pg (ref 26.0–34.0)
MCHC: 33.5 g/dL (ref 30.0–36.0)
MCV: 83.8 fL (ref 78.0–100.0)
Platelets: 356 10*3/uL (ref 150–400)
RBC: 3.95 MIL/uL (ref 3.87–5.11)
RDW: 14.3 % (ref 11.5–15.5)
WBC: 13.6 10*3/uL — ABNORMAL HIGH (ref 4.0–10.5)

## 2014-11-16 LAB — POTASSIUM: Potassium: 3.6 mmol/L (ref 3.5–5.1)

## 2014-11-16 MED ORDER — POTASSIUM CHLORIDE CRYS ER 20 MEQ PO TBCR
40.0000 meq | EXTENDED_RELEASE_TABLET | Freq: Once | ORAL | Status: AC
  Administered 2014-11-16: 40 meq via ORAL
  Filled 2014-11-16: qty 2

## 2014-11-16 NOTE — Progress Notes (Signed)
[facility] PROGRESS NOTE  [name] Frank ZOX:[id number] DOB: 01-Sep-1934 DOA: 11/14/2014 PCP: Kristian Covey, MD  Assessment/Plan: Principal Problem:   Decubitus ulcer of sacral region, stage 4 - obtained MRI of pelvis which reports osteomyelitis. Consulted ID - currently on augmentin, will defer antibiotic regimen selection to ID - reassess wbc levels next am  Active Problems:   Essential hypertension - amlodipine and losartan on board    Osteoarthritis - stable    Protein-calorie malnutrition - continue nutritional supplementation   Code Status: full Family Communication: discussed with patient and family member.  Disposition Plan: Pending improvement in condition   Consultants:  None  Procedures:  None  Antibiotics:  augmentin  HPI/Subjective: Pt has no new complaints.  Objective: Filed Vitals:   11/16/14 0521  BP: 137/82  Pulse: 89  Temp: 98.7 F (37.1 C)  Resp: 18    Intake/Output Summary (Last 24 hours) at 11/16/14 1615 Last data filed at 11/15/14 1820  Gross per 24 hour  Intake    360 ml  Output      0 ml  Net    360 ml   There were no vitals filed for this visit.  Exam:   General:  Pt in nad,alert and awake  Cardiovascular: no cyanosis  Respiratory: no increased wob, no wheezes  Abdomen: nd, no guarding  Musculoskeletal: equal tone BL   Data Reviewed: Basic Metabolic Panel:  Recent Labs Lab 11/14/14 1903 11/15/14 0306 11/16/14 1315  NA 139 138  --   K 3.2* 3.0* 3.6  CL 102 105  --   CO2 27 25  --   GLUCOSE 125* 82  --   BUN 8 6  --   CREATININE 0.91 0.77  --   CALCIUM 8.3* 8.1*  --    Liver Function Tests:  Recent Labs Lab 11/14/14 1903  AST 22  ALT 10*  ALKPHOS 57  BILITOT 0.5  PROT 6.2*  ALBUMIN 2.8*   No results for input(s): LIPASE, AMYLASE in the last 168 hours. No results for input(s): AMMONIA in the last 168 hours. CBC:  Recent Labs Lab 11/14/14 1903 11/15/14 0306 11/16/14 0254   WBC 15.6* 12.7* 13.6*  HGB 12.8 11.2* 11.1*  HCT 39.0 33.0* 33.1*  MCV 83.2 82.7 83.8  PLT 428* 357 356   Cardiac Enzymes: No results for input(s): CKTOTAL, CKMB, CKMBINDEX, TROPONINI in the last 168 hours. BNP (last 3 results) No results for input(s): BNP in the last 8760 hours.  ProBNP (last 3 results)  Recent Labs  09/03/14 0925  PROBNP 37.0    CBG: No results for input(s): GLUCAP in the last 168 hours.  Recent Results (from the past 240 hour(s))  Culture, blood (routine x 2)     Status: None (Preliminary result)   Collection Time: 11/14/14  6:59 PM  Result Value Ref Range Status   Specimen Description BLOOD RIGHT HAND  Final   Special Requests IN PEDIATRIC BOTTLE 1CC  Final   Culture NO GROWTH 2 DAYS  Final   Report Status PENDING  Incomplete  Culture, blood (routine x 2)     Status: None (Preliminary result)   Collection Time: 11/14/14  7:03 PM  Result Value Ref Range Status   Specimen Description BLOOD LEFT HAND  Final   Special Requests BOTTLES DRAWN AEROBIC AND ANAEROBIC 5CC  Final   Culture NO GROWTH 2 DAYS  Final   Report Status PENDING  Incomplete     Studies: Mr Pelvis W Wo Contrast  11/16/2014   CLINICAL DATA:  Stage IV decubitus ulcer, osteomyelitis assessment.  EXAM: MRI PELVIS WITHOUT AND WITH CONTRAST  TECHNIQUE: Multiplanar multisequence MR imaging of the pelvis was performed both before and after administration of intravenous contrast.  CONTRAST:  10mL MULTIHANCE GADOBENATE DIMEGLUMINE 529 MG/ML IV SOLN  COMPARISON:  11/15/2014 CT scan  FINDINGS: Right paracentral decubitus ulcer overlies the coccyx and lower sacrum, and extends all the way down to the bone in this vicinity. Low-level adjacent marrow enhancement throughout the coccyx is present compatible with osteomyelitis but without bony destruction. No definite involvement of the sacrum.  Low-level presacral edema is present. No discrete presacral abscess.  No separate drainable abscess/ fluid pocket  identified. There is diffuse subcutaneous edema along the buttock region.  Sacroiliac joints unremarkable. Right hip implant, with metal artifact causing poor fat saturation. Severe degenerative arthropathy of the left hip.  Lower lumbar spondylosis and degenerative disc disease. Air- fluid levels in the distal colon raising the possibility of diarrheal process. Unusually high T1 signal in the distal colonic contents.  IMPRESSION: 1. Osteomyelitis of the coccygeal segments. The sacral segments appear spared. Overlying decubitus ulcer extends from the cutaneous surface to the coccyx. 2. Frothy fluid in the distal colon, possibly from diarrheal process. This has an unusual appearance with high T1 signal. Although conceivably dietary, high T1 signal can be caused by blood products or proteinaceous fluid, and assessment of possible protein malabsorption as a cause for the patient's current protein calorie malnutrition might be considered.   Electronically Signed   By: Gaylyn Rong M.D.   On: 11/16/2014 09:25   Ct Abdomen Pelvis W Contrast  11/15/2014   CLINICAL DATA:  Stage IV decubitus wound. Appendectomy. Cholecystectomy. Hysterectomy.  EXAM: CT ABDOMEN AND PELVIS WITH CONTRAST  TECHNIQUE: Multidetector CT imaging of the abdomen and pelvis was performed using the standard protocol following bolus administration of intravenous contrast.  CONTRAST:  OMNIPAQUE IOHEXOL 300 MG/ML  SOLN  COMPARISON:  09/18/2012 and the plain films of 11/06/2014.  FINDINGS: Lower chest: Motion degradation involving the lung bases. Clear lung bases. Normal heart size without pericardial or pleural effusion.  Hepatobiliary: Minimal exclusion of hepatic dome. Scattered hepatic cysts. Cholecystectomy, without biliary ductal dilatation.  Pancreas: Pancreatic duct upper normal, similar. No obstructive mass or evidence of acute pancreatitis.  Spleen: Normal  Adrenals/Urinary Tract: Normal right adrenal gland. Left adrenal thickening  is mild. Bilateral renal cysts. Suspect a lower pole right renal collecting system stone or stones. Too small to characterize lesions in both kidneys. No hydronephrosis. Degraded evaluation of the pelvis, secondary to beam hardening artifact from right hip arthroplasty. Grossly normal urinary bladder.  Stomach/Bowel: Proximal gastric underdistention. Large colonic stool burden. Bowel not well opacified with enteric contrast. No small bowel distension.  Vascular/Lymphatic: Advanced aortic and branch vessel atherosclerosis. No abdominal and no gross pelvic adenopathy.  Reproductive: Hysterectomy.  No adnexal mass.  Other: No significant free fluid.  Moderate pelvic floor laxity.  Musculoskeletal: Decubitus ulcer, with soft tissue thickening and gas superficial to the coccyx, including on image 67. No gross osseous destruction identified. Osteopenia. Left hip osteoarthritis. Right hip arthroplasty.  IMPRESSION: 1. Decubitus ulcer superficial to the coccyx. No gross underlying osseous destruction. Given the depth of decubitus ulcer, osteomyelitis remains a concern. The test of choice for evaluating is pre and post-contrast pelvic MRI. 2.  No acute process in the abdomen or pelvis. 3.  Possible constipation. 4. Motion and beam hardening artifact degradation. 5. Advanced atherosclerosis. 6. Possible  right nephrolithiasis.   Electronically Signed   By: Jeronimo Greaves M.D.   On: 11/15/2014 08:24    Scheduled Meds: . amLODipine  5 mg Oral Daily  . amoxicillin-clavulanate  1 tablet Oral BID  . docusate sodium  100 mg Oral BID  . donepezil  10 mg Oral QHS  . enoxaparin (LOVENOX) injection  40 mg Subcutaneous Q24H  . feeding supplement (ENSURE ENLIVE)  237 mL Oral TID BM  . losartan  50 mg Oral Daily  . multivitamin with minerals  1 tablet Oral Daily  . sodium chloride  3 mL Intravenous Q12H   Continuous Infusions:   Time spent: > 35 minutes  Penny Pia  [facility] Pager [phone]. If 7PM-7AM, please  contact night-coverage at [email or web address], password [facility] 11/16/2014, 4:15 PM  LOS: 2 days

## 2014-11-16 NOTE — Consult Note (Addendum)
[facility] for Infectious Disease    Date of Admission:  11/14/2014  Date of Consult:  11/16/2014  Reason for Consult: pelvic osteomyelitis with stage IV decubitus ulcer Referring Physician: Dr Wendee Beavers   HPI: [name] Frank is an 80 y.o. female. past medical history of dementia and limited mobility spending much of her time in chair, also with reduced sensation in pelvic area due to prior back surgery who developed a  stage IV decubitus ulcer.    She was receiving care at the wound care center at [facility] ([facility] It appears cultures done at that facility on 10/30/2014 grew Enterococcus species  that was susceptible to ampicillin and was started on Augmentin therapy. Decubitus ulcer had worsened and plain films had shown pelvic osteomyelitis    Past Medical History  Diagnosis Date  . Allergy   . Hyperlipidemia   . Hypertension   . Anxiety   . GERD (gastroesophageal reflux disease)   . Urinary frequency   . Neurogenic urinary incontinence     since back surgery  . Arthritis     degenerative oateoarthritis  . Decubital ulcer 11/14/2014  . Dementia     Past Surgical History  Procedure Laterality Date  . Appendectomy    . Cholecystectomy    . Abdominal hysterectomy    . Back surgery  2000  . Colonoscopy w/ polypectomy    . Breast surgery  1993    biopsy  . Total hip arthroplasty  01/07/2012    Procedure: TOTAL HIP ARTHROPLASTY;  Surgeon: Yvette Rack., MD;  Location: [facility];  Service: Orthopedics;  Laterality: Right;  ergies:   Allergies  Allergen Reactions  . Codeine Sulfate Other (See Comments)    stomach pains  . Tramadol Hcl Other (See Comments)    stomach cramps     Medications: I have reviewed patients current medications as documented in Epic Anti-infectives    Start     Dose/Rate Route Frequency Ordered Stop   11/14/14 2200  amoxicillin-clavulanate (AUGMENTIN) 875-125 MG per tablet 1 tablet     1 tablet Oral 2  times daily 11/14/14 1737        Social History:  reports that she quit smoking about 31 years ago. Her smoking use included Cigarettes. She has a 5 pack-year smoking history. She has never used smokeless tobacco. She reports that she does not drink alcohol or use illicit drugs.   Family History  Problem Relation Age of Onset  . Diabetes Mother   . Alcohol abuse      fhx  . Arthritis      fhx  . Hypertension      fhx  . Heart disease      fhx  . Cancer      colon, lung, prostate/fhx  . Stroke      fhx  . Depression      fhx      ROS: not reliably obtainable due to the patients dementia   Blood pressure 137/82, pulse 89, temperature 98.7 F (37.1 C), temperature source Oral, resp. rate 18, height _0  (1.702 m), SpO2 100 %. General: Alert and awake, oriented person, underweight HEENT: anicteric sclera, pupils , EOMI, oropharynx clear and without exudate CVS regular rate, normal r,  no murmur rubs or gallops Chest: clear to auscultation bilaterally, no wheezing, rales or rhonchi Abdomen: soft nontender, nondistended, normal bowel sounds, Extremities: no  clubbing or edema  noted bilaterally Skin:   Stage IV decubitus ulcer:  11/16/14:      Neuro: nonfocal   Results for orders placed or performed during the hospital encounter of 11/14/14 (from the past 48 hour(s))  Culture, blood (routine x 2)     Status: None (Preliminary result)   Collection Time: 11/14/14  6:59 PM  Result Value Ref Range   Specimen Description BLOOD RIGHT HAND    Special Requests IN PEDIATRIC BOTTLE 1CC    Culture NO GROWTH 2 DAYS    Report Status PENDING   CBC     Status: Abnormal   Collection Time: 11/14/14  7:03 PM  Result Value Ref Range   WBC 15.6 (H) 4.0 - 10.5 K/uL   RBC 4.69 3.87 - 5.11 MIL/uL   Hemoglobin 12.8 12.0 - 15.0 g/dL   HCT 39.0 36.0 - 46.0 %   MCV 83.2 78.0 - 100.0 fL   MCH 27.3 26.0 - 34.0 pg   MCHC 32.8 30.0 - 36.0 g/dL   RDW 14.0 11.5 - 15.5 %   Platelets 428  (H) 150 - 400 K/uL  Comprehensive metabolic panel     Status: Abnormal   Collection Time: 11/14/14  7:03 PM  Result Value Ref Range   Sodium 139 135 - 145 mmol/L   Potassium 3.2 (L) 3.5 - 5.1 mmol/L   Chloride 102 101 - 111 mmol/L   CO2 27 22 - 32 mmol/L   Glucose, Bld 125 (H) 65 - 99 mg/dL   BUN 8 6 - 20 mg/dL   Creatinine, Ser 0.91 0.44 - 1.00 mg/dL   Calcium 8.3 (L) 8.9 - 10.3 mg/dL   Total Protein 6.2 (L) 6.5 - 8.1 g/dL   Albumin 2.8 (L) 3.5 - 5.0 g/dL   AST 22 15 - 41 U/L   ALT 10 (L) 14 - 54 U/L   Alkaline Phosphatase 57 38 - 126 U/L   Total Bilirubin 0.5 0.3 - 1.2 mg/dL   GFR calc non Af Amer 59 (L) >60 mL/min   GFR calc Af Amer >60 >60 mL/min    Comment: (NOTE) The eGFR has been calculated using the CKD EPI equation. This calculation has not been validated in all clinical situations. eGFR's persistently <60 mL/min signify possible Chronic Kidney Disease.    Anion gap 10 5 - 15  Sedimentation rate     Status: Abnormal   Collection Time: 11/14/14  7:03 PM  Result Value Ref Range   Sed Rate 28 (H) 0 - 22 mm/hr  C-reactive protein     Status: Abnormal   Collection Time: 11/14/14  7:03 PM  Result Value Ref Range   CRP 1.5 (H) <1.0 mg/dL  Protime-INR     Status: Abnormal   Collection Time: 11/14/14  7:03 PM  Result Value Ref Range   Prothrombin Time 16.2 (H) 11.6 - 15.2 seconds   INR 1.29 0.00 - 1.49  Culture, blood (routine x 2)     Status: None (Preliminary result)   Collection Time: 11/14/14  7:03 PM  Result Value Ref Range   Specimen Description BLOOD LEFT HAND    Special Requests BOTTLES DRAWN AEROBIC AND ANAEROBIC 5CC    Culture NO GROWTH 2 DAYS    Report Status PENDING   Lactic acid, plasma     Status: None   Collection Time: 11/14/14  7:30 PM  Result Value Ref Range   Lactic Acid, Venous 2.0 0.5 - 2.0 mmol/L  Basic metabolic panel  Status: Abnormal   Collection Time: 11/15/14  3:06 AM  Result Value Ref Range   Sodium 138 135 - 145 mmol/L   Potassium  3.0 (L) 3.5 - 5.1 mmol/L   Chloride 105 101 - 111 mmol/L   CO2 25 22 - 32 mmol/L   Glucose, Bld 82 65 - 99 mg/dL   BUN 6 6 - 20 mg/dL   Creatinine, Ser 0.77 0.44 - 1.00 mg/dL   Calcium 8.1 (L) 8.9 - 10.3 mg/dL   GFR calc non Af Amer >60 >60 mL/min   GFR calc Af Amer >60 >60 mL/min    Comment: (NOTE) The eGFR has been calculated using the CKD EPI equation. This calculation has not been validated in all clinical situations. eGFR's persistently <60 mL/min signify possible Chronic Kidney Disease.    Anion gap 8 5 - 15  CBC     Status: Abnormal   Collection Time: 11/15/14  3:06 AM  Result Value Ref Range   WBC 12.7 (H) 4.0 - 10.5 K/uL   RBC 3.99 3.87 - 5.11 MIL/uL   Hemoglobin 11.2 (L) 12.0 - 15.0 g/dL   HCT 33.0 (L) 36.0 - 46.0 %   MCV 82.7 78.0 - 100.0 fL   MCH 28.1 26.0 - 34.0 pg   MCHC 33.9 30.0 - 36.0 g/dL   RDW 14.2 11.5 - 15.5 %   Platelets 357 150 - 400 K/uL  Urinalysis, Routine w reflex microscopic (not at [facility])     Status: Abnormal   Collection Time: 11/15/14  5:59 PM  Result Value Ref Range   Color, Urine YELLOW YELLOW   APPearance CLOUDY (A) CLEAR   Specific Gravity, Urine 1.010 1.005 - 1.030   pH 5.5 5.0 - 8.0   Glucose, UA NEGATIVE NEGATIVE mg/dL   Hgb urine dipstick NEGATIVE NEGATIVE   Bilirubin Urine NEGATIVE NEGATIVE   Ketones, ur 15 (A) NEGATIVE mg/dL   Protein, ur NEGATIVE NEGATIVE mg/dL   Urobilinogen, UA 0.2 0.0 - 1.0 mg/dL   Nitrite NEGATIVE NEGATIVE   Leukocytes, UA SMALL (A) NEGATIVE  Urine microscopic-add on     Status: Abnormal   Collection Time: 11/15/14  5:59 PM  Result Value Ref Range   Squamous Epithelial / LPF FEW (A) RARE   WBC, UA 3-6 <3 WBC/hpf   RBC / HPF 0-2 <3 RBC/hpf   Bacteria, UA MANY (A) RARE   Crystals CA OXALATE CRYSTALS (A) NEGATIVE   Urine-Other MUCOUS PRESENT   CBC     Status: Abnormal   Collection Time: 11/16/14  2:54 AM  Result Value Ref Range   WBC 13.6 (H) 4.0 - 10.5 K/uL   RBC 3.95 3.87 - 5.11 MIL/uL   Hemoglobin  11.1 (L) 12.0 - 15.0 g/dL   HCT 33.1 (L) 36.0 - 46.0 %   MCV 83.8 78.0 - 100.0 fL   MCH 28.1 26.0 - 34.0 pg   MCHC 33.5 30.0 - 36.0 g/dL   RDW 14.3 11.5 - 15.5 %   Platelets 356 150 - 400 K/uL  Potassium     Status: None   Collection Time: 11/16/14  1:15 PM  Result Value Ref Range   Potassium 3.6 3.5 - 5.1 mmol/L   _0 (sdes,specrequest,cult,reptstatus)   ) Recent Results (from the past 720 hour(s))  Wound culture     Status: None   Collection Time: 10/30/14  8:59 AM  Result Value Ref Range Status   Specimen Description ABSCESS  Final   Special Requests NONE  Final  Gram Stain   Final    FEW WBC SEEN MODERATE GRAM NEGATIVE RODS MANY GRAM POSITIVE COCCI IN PAIRS    Culture   Final    LIGHT GROWTH ENTEROCOCCUS AVIUM WITH NORMAL SKIN FLORA    Report Status 11/04/2014 FINAL  Final   Organism ID, Bacteria ENTEROCOCCUS AVIUM  Final      Susceptibility   Enterococcus avium - MIC*    AMPICILLIN <=2 SENSITIVE Sensitive     LINEZOLID Value in next row Sensitive      SENSITIVE2    * LIGHT GROWTH ENTEROCOCCUS AVIUM  Culture, blood (routine x 2)     Status: None (Preliminary result)   Collection Time: 11/14/14  6:59 PM  Result Value Ref Range Status   Specimen Description BLOOD RIGHT HAND  Final   Special Requests IN PEDIATRIC BOTTLE 1CC  Final   Culture NO GROWTH 2 DAYS  Final   Report Status PENDING  Incomplete  Culture, blood (routine x 2)     Status: None (Preliminary result)   Collection Time: 11/14/14  7:03 PM  Result Value Ref Range Status   Specimen Description BLOOD LEFT HAND  Final   Special Requests BOTTLES DRAWN AEROBIC AND ANAEROBIC 5CC  Final   Culture NO GROWTH 2 DAYS  Final   Report Status PENDING  Incomplete     Impression/Recommendation  Principal Problem:   Decubitus ulcer of sacral region, stage 4 Active Problems:   Essential hypertension   Osteoarthritis   Protein-calorie malnutrition   Stage IV decubitus ulcer   Protein-calorie  malnutrition, severe   [name] Frank is a 80 y.o. female with  Dementia, stage VI decubitus ulcer with pelvic osteomyelitis  that developed in context of poor sensation in this area due to spinal injury with lumbar surgery, poor mobility, dementia  #1 Pelvic osteomyelitis due to decubitus ulcer:  She is certainly NOT acutely ill  IF she is to receive aggressive care for this one would need involvement of   PLASTIC SURGERY, Potentially General Surgery.  IF patient is candidate for surgery would recommend asking Plastics vs Orthopedics to obtain a deep culture from bone OFF antibiotics to help direct therapy  If such an attempt is felt too risky we are stuck with empirical antibiotics which would need to involve vancomycin (to cover MRSA, coag neg staph) and a bactericidal agent vs strep species and potentially anerobes. Not clear that there are sig risk factors for pseudomonas here  She would need consideration of diverting colostomy as well though her husband does not think that stool is contaminating the wound  She would need optimization of turning off loading, PT OT, nutrition   I think that given her dementia and given complexity of care she will  Require with her large decubitus ulcer that [place] is warranted. Today husband and pt voiced desire to pursue aggressive options including potential surgery and prolonged SNF placement but I think they would benefit from more extensive conversations  I will stop her augmentin in anticipation of deep cultures POTENTIALLY being obtained. The superficial culture from wound is not informative   I reviewed her MRI, CT plain films and labs.  My understanding is that there has been difficutly finding out who is on call for Plastics this weekend but there should be someone and if not this weekend certainly by Tuesday (bywhich  Time abx may have had time to "wash out somewhat."   11/16/2014, 5:27 PM   Thank you so much for  this interesting consult  [place] [facility] [place] [phone] (pager) [phone] (office) 11/16/2014, 5:27 PM  [place] 11/16/2014, 5:27 PM

## 2014-11-17 LAB — BASIC METABOLIC PANEL
ANION GAP: 7 (ref 5–15)
BUN: 7 mg/dL (ref 6–20)
CALCIUM: 8.4 mg/dL — AB (ref 8.9–10.3)
CHLORIDE: 103 mmol/L (ref 101–111)
CO2: 27 mmol/L (ref 22–32)
CREATININE: 0.64 mg/dL (ref 0.44–1.00)
GFR calc Af Amer: >60 mL/min (ref >60)
GFR calc non Af Amer: >60 mL/min (ref >60)
GLUCOSE: 92 mg/dL (ref 65–99)
Potassium: 4.3 mmol/L (ref 3.5–5.1)
Sodium: 137 mmol/L (ref 135–145)

## 2014-11-17 LAB — CBC
HEMATOCRIT: 35.4 % — AB (ref 36.0–46.0)
HEMOGLOBIN: 11.5 g/dL — AB (ref 12.0–15.0)
MCH: 27.3 pg (ref 26.0–34.0)
MCHC: 32.5 g/dL (ref 30.0–36.0)
MCV: 83.9 fL (ref 78.0–100.0)
Platelets: 364 10*3/uL (ref 150–400)
RBC: 4.22 MIL/uL (ref 3.87–5.11)
RDW: 14.7 % (ref 11.5–15.5)
WBC: 14.5 10*3/uL — ABNORMAL HIGH (ref 4.0–10.5)

## 2014-11-17 LAB — PREALBUMIN: Prealbumin: 13.4 mg/dL — ABNORMAL LOW (ref 18–38)

## 2014-11-17 LAB — SEDIMENTATION RATE: SED RATE: 40 mm/h — AB (ref 0–22)

## 2014-11-17 LAB — C-REACTIVE PROTEIN: CRP: 1.1 mg/dL — AB (ref <1.0)

## 2014-11-17 MED ORDER — DIPHENHYDRAMINE HCL 25 MG PO CAPS: 12.5000 mg | ORAL_CAPSULE | Freq: Four times a day (QID) | ORAL | Status: DC | PRN

## 2014-11-17 MED ORDER — DIPHENHYDRAMINE HCL 12.5 MG/5ML PO ELIX
12.5000 mg | ORAL_SOLUTION | Freq: Four times a day (QID) | ORAL | Status: DC | PRN
  Administered 2014-11-17 – 2014-11-22 (×2): 12.5 mg via ORAL
  Filled 2014-11-17 (×2): qty 10

## 2014-11-17 NOTE — Progress Notes (Signed)
[facility] PROGRESS NOTE  VERGIA CHEA ZOX:[id number] DOB: [birth year] DOA: 11/14/2014 PCP: Kristian Covey, MD  Assessment/Plan: Principal Problem:   Decubitus ulcer of sacral region, stage 4 - obtained MRI of pelvis which reports osteomyelitis. Consulted ID - Monitor on Augmentin. Consult or orthopedic surgery or plastic surgery for biopsy. We'll consult next a.m. - reassess wbc levels next am  Active Problems:   Essential hypertension - amlodipine and losartan on board    Osteoarthritis - stable    Protein-calorie malnutrition - continue nutritional supplementation   Code Status: full Family Communication: discussed with patient and family member.  Disposition Plan: Consult Ortho or plastic surgeon next a.m.   Consultants:  Infectious disease specialist  Procedures:  None  Antibiotics:  augmentin  HPI/Subjective: Pt has no new complaints.  Objective: Filed Vitals:   11/17/14 0538  BP: 103/57  Pulse: 78  Temp: 98.8 F (37.1 C)  Resp: 17    Intake/Output Summary (Last 24 hours) at 11/17/14 1510 Last data filed at 11/17/14 0500  Gross per 24 hour  Intake    777 ml  Output      0 ml  Net    777 ml   Filed Weights   11/16/14 2045  Weight: 51.71 kg (114 lb)    Exam:   General:  Pt in nad,alert and awake  Cardiovascular: no cyanosis  Respiratory: no increased wob, no wheezes  Abdomen: nd, no guarding  Musculoskeletal: equal tone BL   Data Reviewed: Basic Metabolic Panel:  Recent Labs Lab 11/14/14 1903 11/15/14 0306 11/16/14 1315 11/17/14 0700  NA 139 138  --  137  K 3.2* 3.0* 3.6 4.3  CL 102 105  --  103  CO2 27 25  --  27  GLUCOSE 125* 82  --  92  BUN 8 6  --  7  CREATININE 0.91 0.77  --  0.64  CALCIUM 8.3* 8.1*  --  8.4*   Liver Function Tests:  Recent Labs Lab 11/14/14 1903  AST 22  ALT 10*  ALKPHOS 57  BILITOT 0.5  PROT 6.2*  ALBUMIN 2.8*   No results for input(s): LIPASE, AMYLASE in the last 168  hours. No results for input(s): AMMONIA in the last 168 hours. CBC:  Recent Labs Lab 11/14/14 1903 11/15/14 0306 11/16/14 0254 11/17/14 0700  WBC 15.6* 12.7* 13.6* 14.5*  HGB 12.8 11.2* 11.1* 11.5*  HCT 39.0 33.0* 33.1* 35.4*  MCV 83.2 82.7 83.8 83.9  PLT 428* 357 356 364   Cardiac Enzymes: No results for input(s): CKTOTAL, CKMB, CKMBINDEX, TROPONINI in the last 168 hours. BNP (last 3 results) No results for input(s): BNP in the last 8760 hours.  ProBNP (last 3 results)  Recent Labs  09/03/14 0925  PROBNP 37.0    CBG: No results for input(s): GLUCAP in the last 168 hours.  Recent Results (from the past 240 hour(s))  Culture, blood (routine x 2)     Status: None (Preliminary result)   Collection Time: 11/14/14  6:59 PM  Result Value Ref Range Status   Specimen Description BLOOD RIGHT HAND  Final   Special Requests IN PEDIATRIC BOTTLE 1CC  Final   Culture NO GROWTH 3 DAYS  Final   Report Status PENDING  Incomplete  Culture, blood (routine x 2)     Status: None (Preliminary result)   Collection Time: 11/14/14  7:03 PM  Result Value Ref Range Status   Specimen Description BLOOD LEFT HAND  Final   Special Requests  BOTTLES DRAWN AEROBIC AND ANAEROBIC 5CC  Final   Culture NO GROWTH 3 DAYS  Final   Report Status PENDING  Incomplete     Studies: Mr Pelvis W Wo Contrast  11/16/2014   CLINICAL DATA:  Stage IV decubitus ulcer, osteomyelitis assessment.  EXAM: MRI PELVIS WITHOUT AND WITH CONTRAST  TECHNIQUE: Multiplanar multisequence MR imaging of the pelvis was performed both before and after administration of intravenous contrast.  CONTRAST:  10mL MULTIHANCE GADOBENATE DIMEGLUMINE 529 MG/ML IV SOLN  COMPARISON:  11/15/2014 CT scan  FINDINGS: Right paracentral decubitus ulcer overlies the coccyx and lower sacrum, and extends all the way down to the bone in this vicinity. Low-level adjacent marrow enhancement throughout the coccyx is present compatible with osteomyelitis but  without bony destruction. No definite involvement of the sacrum.  Low-level presacral edema is present. No discrete presacral abscess.  No separate drainable abscess/ fluid pocket identified. There is diffuse subcutaneous edema along the buttock region.  Sacroiliac joints unremarkable. Right hip implant, with metal artifact causing poor fat saturation. Severe degenerative arthropathy of the left hip.  Lower lumbar spondylosis and degenerative disc disease. Air- fluid levels in the distal colon raising the possibility of diarrheal process. Unusually high T1 signal in the distal colonic contents.  IMPRESSION: 1. Osteomyelitis of the coccygeal segments. The sacral segments appear spared. Overlying decubitus ulcer extends from the cutaneous surface to the coccyx. 2. Frothy fluid in the distal colon, possibly from diarrheal process. This has an unusual appearance with high T1 signal. Although conceivably dietary, high T1 signal can be caused by blood products or proteinaceous fluid, and assessment of possible protein malabsorption as a cause for the patient's current protein calorie malnutrition might be considered.   Electronically Signed   By: Gaylyn Rong M.D.   On: 11/16/2014 09:25    Scheduled Meds: . amLODipine  5 mg Oral Daily  . docusate sodium  100 mg Oral BID  . donepezil  10 mg Oral QHS  . enoxaparin (LOVENOX) injection  40 mg Subcutaneous Q24H  . feeding supplement (ENSURE ENLIVE)  237 mL Oral TID BM  . losartan  50 mg Oral Daily  . multivitamin with minerals  1 tablet Oral Daily  . sodium chloride  3 mL Intravenous Q12H   Continuous Infusions:   Time spent: > 35 minutes  Penny Pia  [facility] Pager [phone]. If 7PM-7AM, please contact night-coverage at [email or web address], password [facility] 11/17/2014, 3:10 PM  LOS: 3 days

## 2014-11-18 LAB — HIV ANTIBODY (ROUTINE TESTING W REFLEX): HIV Screen 4th Generation wRfx: NONREACTIVE

## 2014-11-18 NOTE — Clinical Social Work Note (Signed)
CSW consult acknowledged:  Clinical Child psychotherapist received consult for SNF placement. PT currently recommending 24 hour supervision/assistance. Patient ambulating 150 ft.   Clinical Social Worker will sign off for now as social work intervention is no longer needed. Please consult [place] again if new need arises.  Derenda Fennel, MSW, LCSWA [phone] 11/18/2014 9:43 AM

## 2014-11-18 NOTE — Care Management Important Message (Signed)
Important Message  Patient Details  Name: AVANTI JETTER MRN: [id number] Date of Birth: April 07, 1934   Medicare Important Message Given:  Yes-second notification given    Bernadette Hoit 11/18/2014, 9:25 AM

## 2014-11-18 NOTE — Progress Notes (Signed)
Nutrition Follow-up / Consult  DOCUMENTATION CODES:   Severe malnutrition in context of chronic illness, Underweight  INTERVENTION:    Continue Ensure Enlive PO BID, each supplement provides 350 kcal and 20 grams of protein  Continue MVI daily  NUTRITION DIAGNOSIS:   Malnutrition related to chronic illness as evidenced by severe depletion of body fat, severe depletion of muscle mass.  Ongoing  GOAL:   Patient will meet greater than or equal to 90% of their needs  Met  MONITOR:   PO intake, Supplement acceptance, Labs, Weight trends, Skin, I & O's  REASON FOR ASSESSMENT:   Consult Wound healing  ASSESSMENT:   FONDA ROCHON is a 80 y.o. female with a past medical history of stage IV decubitus ulcer, hypertension, dyslipidemia, cognitive impairment, who presents as a transfer from her primary care physician's office. She was receiving care at the wound care center at [facility]. It appears cultures done at that facility on 10/30/2014 grew Enterococcus avium that was susceptible to ampicillin and was started on Augmentin therapy. Throughout this time she has denied having associated pain, fevers, chills, nausea, vomiting. At baseline family members tell me she is minimally active and requires assistance with activities of daily living. They state that she spends the majority or day sitting in her recliner or laying in bed. On 11/06/2014 x-ray revealed evidence of destruction within the distal sacrum and coccyx suspected to be secondary to infection. Radiology had recommended a further workup with CT or MRI. Her primary care physician recommended hospitalization to undergo workup and further treatment.  Patient currently receiving a heart healthy diet, consuming 50-100% of meals, mostly 95-100%. Also receiving Ensure Enlive BID, which provides an additional 700 kcals and 40 gm protein per day. MVI was also ordered on Friday.   Labs reviewed: potassium  now WNL at 4.3.  Diet Order:  Diet Heart Room service appropriate?: Yes; Fluid consistency:: Thin  Skin:  Wound (see comment) (stage IV buttocks)  Last BM:  9/5  Height:   Ht Readings from Last 1 Encounters:  11/16/14 _0  (1.702 m)    Weight:   Wt Readings from Last 1 Encounters:  11/16/14 114 lb (51.71 kg)    Ideal Body Weight:  61.4 kg  BMI:  Body mass index is 17.85 kg/(m^2).  Estimated Nutritional Needs:   Kcal:  1600-1800  Protein:  70-80 grams  Fluid:  >1.6-1.8 L  EDUCATION NEEDS:   Education needs addressed  Molli Barrows, [place], [place], [place] Pager [phone] After Hours Pager [phone]

## 2014-11-18 NOTE — Progress Notes (Signed)
[facility] PROGRESS NOTE  [name] Frank ZOX:[id number] DOB: 1934/12/10 DOA: 11/14/2014 PCP: Kristian Covey, MD  Assessment/Plan: Principal Problem:   Decubitus ulcer of sacral region, stage 4 - obtained MRI of pelvis which reports osteomyelitis. Consulted ID - Monitor on Augmentin. Per my discussion with ortho will need to consult plastic surgery who is not available at this juncture. Will try again tomorrow. Family would like to proceed with procedure. - reassess wbc levels next am  Active Problems:   Essential hypertension - amlodipine and losartan on board    Osteoarthritis - stable    Protein-calorie malnutrition - continue nutritional supplementation  Petechial rash - resolving after discontinuing antibiotic and amlodipine. Etiology uncertain  Code Status: full Family Communication: discussed with patient and family member.  Disposition Plan: Consult Ortho or plastic surgeon next a.m.   Consultants:  Infectious disease specialist  Procedures:  None  Antibiotics:  augmentin  HPI/Subjective: Pt has no new complaints. No acute issues reported overnight.  Objective: Filed Vitals:   11/18/14 0543  BP: 129/69  Pulse: 89  Temp: 99.2 F (37.3 C)  Resp: 18    Intake/Output Summary (Last 24 hours) at 11/18/14 1647 Last data filed at 11/18/14 0900  Gross per 24 hour  Intake    600 ml  Output      0 ml  Net    600 ml   Filed Weights   11/16/14 2045  Weight: 51.71 kg (114 lb)    Exam:   General:  Pt in nad,alert and awake  Cardiovascular: no cyanosis  Respiratory: no increased wob, no wheezes  Abdomen: nd, no guarding  Musculoskeletal: equal tone BL   Data Reviewed: Basic Metabolic Panel:  Recent Labs Lab 11/14/14 1903 11/15/14 0306 11/16/14 1315 11/17/14 0700  NA 139 138  --  137  K 3.2* 3.0* 3.6 4.3  CL 102 105  --  103  CO2 27 25  --  27  GLUCOSE 125* 82  --  92  BUN 8 6  --  7  CREATININE 0.91 0.77  --  0.64   CALCIUM 8.3* 8.1*  --  8.4*   Liver Function Tests:  Recent Labs Lab 11/14/14 1903  AST 22  ALT 10*  ALKPHOS 57  BILITOT 0.5  PROT 6.2*  ALBUMIN 2.8*   No results for input(s): LIPASE, AMYLASE in the last 168 hours. No results for input(s): AMMONIA in the last 168 hours. CBC:  Recent Labs Lab 11/14/14 1903 11/15/14 0306 11/16/14 0254 11/17/14 0700  WBC 15.6* 12.7* 13.6* 14.5*  HGB 12.8 11.2* 11.1* 11.5*  HCT 39.0 33.0* 33.1* 35.4*  MCV 83.2 82.7 83.8 83.9  PLT 428* 357 356 364   Cardiac Enzymes: No results for input(s): CKTOTAL, CKMB, CKMBINDEX, TROPONINI in the last 168 hours. BNP (last 3 results) No results for input(s): BNP in the last 8760 hours.  ProBNP (last 3 results)  Recent Labs  09/03/14 0925  PROBNP 37.0    CBG: No results for input(s): GLUCAP in the last 168 hours.  Recent Results (from the past 240 hour(s))  Culture, blood (routine x 2)     Status: None (Preliminary result)   Collection Time: 11/14/14  6:59 PM  Result Value Ref Range Status   Specimen Description BLOOD RIGHT HAND  Final   Special Requests IN PEDIATRIC BOTTLE 1CC  Final   Culture NO GROWTH 4 DAYS  Final   Report Status PENDING  Incomplete  Culture, blood (routine x 2)  Status: None (Preliminary result)   Collection Time: 11/14/14  7:03 PM  Result Value Ref Range Status   Specimen Description BLOOD LEFT HAND  Final   Special Requests BOTTLES DRAWN AEROBIC AND ANAEROBIC 5CC  Final   Culture NO GROWTH 4 DAYS  Final   Report Status PENDING  Incomplete     Studies: No results found.  Scheduled Meds: . docusate sodium  100 mg Oral BID  . donepezil  10 mg Oral QHS  . enoxaparin (LOVENOX) injection  40 mg Subcutaneous Q24H  . feeding supplement (ENSURE ENLIVE)  237 mL Oral TID BM  . losartan  50 mg Oral Daily  . multivitamin with minerals  1 tablet Oral Daily  . sodium chloride  3 mL Intravenous Q12H   Continuous Infusions:   Time spent: > 35 minutes  Penny Pia  [facility] Pager [phone]. If 7PM-7AM, please contact night-coverage at [email or web address], password [facility] 11/18/2014, 4:47 PM  LOS: 4 days

## 2014-11-18 NOTE — Progress Notes (Signed)
Physical Therapy Treatment Patient Details Name: [name] Frank MRN: [id number] DOB: November 05, 1934 Today's Date: 11/18/2014    History of Present Illness sacral decubitus ulcer    PT Comments    Patient able to increase her ambulation to 250 feet with rw. Continue to require supervision for mobility. Recommending 24 hour supervision upon D/C.   Follow Up Recommendations  Supervision/Assistance - 24 hour     Equipment Recommendations  Rolling walker with 5" wheels    Recommendations for Other Services       Precautions / Restrictions Precautions Precautions: Fall Restrictions Weight Bearing Restrictions: No    Mobility  Bed Mobility               General bed mobility comments: found and returned to bed  Transfers Overall transfer level: Needs assistance Equipment used: 1 person hand held assist Transfers: Sit to/from Stand Sit to Stand: Min assist         General transfer comment: cues for scooting forward in chair prior to standing, not completing before attempting to stand.   Ambulation/Gait Ambulation/Gait assistance: Min assist Ambulation Distance (Feet): 250 Feet Assistive device: Rolling walker (2 wheeled) Gait Pattern/deviations: Step-through pattern;Decreased step length - right;Decreased step length - left Gait velocity: decreased   General Gait Details: no loss of balance with ambulation, cues for increasing strides with ambulation   Stairs            Wheelchair Mobility    Modified Rankin (Stroke Patients Only)       Balance Overall balance assessment: Needs assistance Sitting-balance support: No upper extremity supported Sitting balance-Leahy Scale: Fair     Standing balance support: Bilateral upper extremity supported Standing balance-Leahy Scale: Poor Standing balance comment: using rw for support                    Cognition Arousal/Alertness: Awake/alert Behavior During Therapy: WFL for tasks  assessed/performed Overall Cognitive Status: History of cognitive impairments - at baseline                      Exercises      General Comments        Pertinent Vitals/Pain Pain Assessment: No/denies pain    Home Living                      Prior Function            PT Goals (current goals can now be found in the care plan section) Acute Rehab PT Goals Patient Stated Goal: go home PT Goal Formulation: With patient Time For Goal Achievement: 11/29/14 Potential to Achieve Goals: Fair Progress towards PT goals: Progressing toward goals    Frequency  Min 3X/week    PT Plan Current plan remains appropriate    Co-evaluation             End of Session Equipment Utilized During Treatment: Gait belt Activity Tolerance: Patient tolerated treatment well Patient left: in chair;with call bell/phone within reach;with family/visitor present;Other (comment) (family reports that they are not leaving patient unattended)     Time: [phone] PT Time Calculation (min) (ACUTE ONLY): 22 min  Charges:  $Gait Training: 8-22 mins                    G Codes:      Christiane Ha, PT, CSCS Pager [phone] Office [phone]  11/18/2014, 2:55 PM

## 2014-11-18 NOTE — Progress Notes (Signed)
Occupational Therapy Treatment Patient Details Name: [name] Frank MRN: [id number] DOB: October 13, 1934 Today's Date: 11/18/2014    History of present illness Pt admitted with stage 4 sacral decubitus ulcer with osteomyelitis.   OT comments  Focus of session on toileting and functional mobility.  Pt requiring min assist and verbal cues with RW. Husband and son educated in importance of keeping periarea dry and clean and frequent pressure relief.   Follow Up Recommendations  SNF;Supervision/Assistance - 24 hour    Equipment Recommendations       Recommendations for Other Services      Precautions / Restrictions Precautions Precautions: Fall Restrictions Weight Bearing Restrictions: No       Mobility Bed Mobility               General bed mobility comments: pt in chair  Transfers Overall transfer level: Needs assistance Equipment used: 1 person hand held assist Transfers: Sit to/from Stand Sit to Stand: Min assist         General transfer comment: cues for scooting forward in chair prior to standing, min assist to rise, verbal cues for hand placement    Balance Overall balance assessment: Needs assistance Sitting-balance support: No upper extremity supported Sitting balance-Leahy Scale: Fair     Standing balance support: Bilateral upper extremity supported Standing balance-Leahy Scale: Poor Standing balance comment: using rw for support                   ADL Overall ADL's : Needs assistance/impaired     Grooming: Wash/dry hands;Minimal assistance;Standing Grooming Details (indicate cue type and reason): verbal cues for sequencing             Lower Body Dressing: Maximal assistance;Sit to/from stand Lower Body Dressing Details (indicate cue type and reason): assist to start adult diaper over feet, pt helped to pull up Toilet Transfer: Minimal assistance;Ambulation;RW;BSC Toilet Transfer Details (indicate cue type and reason): verbal cues  for sequencing Toileting- Clothing Manipulation and Hygiene: Total assistance Toileting - Clothing Manipulation Details (indicate cue type and reason): pt without sensation, incontinent of bowel     Functional mobility during ADLs: Minimal assistance;Cueing for sequencing;Cueing for safety        Vision                     Perception     Praxis      Cognition   Behavior During Therapy: WFL for tasks assessed/performed Overall Cognitive Status: History of cognitive impairments - at baseline                       Extremity/Trunk Assessment               Exercises     Shoulder Instructions       General Comments      Pertinent Vitals/ Pain       Pain Assessment: No/denies pain  Home Living                                          Prior Functioning/Environment              Frequency Min 2X/week     Progress Toward Goals  OT Goals(current goals can now be found in the care plan section)  Progress towards OT goals: Progressing toward goals  Acute Rehab OT Goals Patient Stated  Goal: family wants pt to be stronger  Plan Discharge plan remains appropriate    Co-evaluation                 End of Session Equipment Utilized During Treatment: Gait belt;Rolling walker   Activity Tolerance Patient tolerated treatment well   Patient Left in chair;with call bell/phone within reach;with family/visitor present   Nurse Communication  (family wants SNF)        Time: [phone] OT Time Calculation (min): 27 min  Charges: OT General Charges $OT Visit: 1 Procedure OT Treatments $Self Care/Home Management : 23-37 mins  Evern Bio 11/18/2014, 4:24 PM  [phone]

## 2014-11-19 DIAGNOSIS — B9689 Other specified bacterial agents as the cause of diseases classified elsewhere: Secondary | ICD-10-CM

## 2014-11-19 DIAGNOSIS — M869 Osteomyelitis, unspecified: Secondary | ICD-10-CM

## 2014-11-19 DIAGNOSIS — L89154 Pressure ulcer of sacral region, stage 4: Principal | ICD-10-CM

## 2014-11-19 LAB — CULTURE, BLOOD (ROUTINE X 2)
CULTURE: NO GROWTH
Culture: NO GROWTH

## 2014-11-19 NOTE — Progress Notes (Signed)
[facility] PROGRESS NOTE  [name] Frank ZOX:[id number] DOB: [birth year] DOA: 11/14/2014 PCP: Kristian Covey, MD  Brief narrative: Patient is a 80 year old African-American female with history of dementia and limited mobility spending much of her time in chair with history of reduced sensation in pelvic area due to prior back surgery. Subsequently developed stage IV decubitus ulcer. Cultures done at [facility] on 10/30/2014 grew enterococcus species which was susceptible to ampicillin and patient was started on Augmentin therapy. Was transferred here at the request of her primary care physician for further evaluation of sacral decubitus ulcer  Assessment/Plan: Principal Problem:   Decubitus ulcer of sacral region, stage 4 - obtained MRI of pelvis which reports osteomyelitis. Consulted ID and consulted Plastic surgery - Monitor on Augmentin. - Family would like to proceed with procedure. - ID today recommending consideration of diversion of stool and urine. Have not had this discussion with patient and family.  Active Problems:   Essential hypertension - amlodipine and losartan on board    Osteoarthritis - stable    Protein-calorie malnutrition - continue nutritional supplementation  Petechial rash - resolving after discontinuing antibiotic and amlodipine. Etiology uncertain  Code Status: full Family Communication: discussed with patient and family member.  Disposition Plan: Pending further work up and recommendations from specialist.   Consultants:  Infectious disease specialist  Plastic surgery  Procedures:  None  Antibiotics:  augmentin  HPI/Subjective: Pt has no new complaints. No acute issues reported overnight.  Objective: Filed Vitals:   11/19/14 1500  BP: 140/79  Pulse: 74  Temp: 98.4 F (36.9 C)  Resp: 18    Intake/Output Summary (Last 24 hours) at 11/19/14 1651 Last data filed at 11/19/14 1500  Gross per 24 hour  Intake     480 ml  Output      0 ml  Net    480 ml   Filed Weights   11/16/14 2045  Weight: 51.71 kg (114 lb)    Exam:   General:  Pt in nad,alert and awake  Cardiovascular: no cyanosis  Respiratory: no increased wob, no wheezes  Abdomen: nd, no guarding  Musculoskeletal: equal tone BL   Data Reviewed: Basic Metabolic Panel:  Recent Labs Lab 11/14/14 1903 11/15/14 0306 11/16/14 1315 11/17/14 0700  NA 139 138  --  137  K 3.2* 3.0* 3.6 4.3  CL 102 105  --  103  CO2 27 25  --  27  GLUCOSE 125* 82  --  92  BUN 8 6  --  7  CREATININE 0.91 0.77  --  0.64  CALCIUM 8.3* 8.1*  --  8.4*   Liver Function Tests:  Recent Labs Lab 11/14/14 1903  AST 22  ALT 10*  ALKPHOS 57  BILITOT 0.5  PROT 6.2*  ALBUMIN 2.8*   No results for input(s): LIPASE, AMYLASE in the last 168 hours. No results for input(s): AMMONIA in the last 168 hours. CBC:  Recent Labs Lab 11/14/14 1903 11/15/14 0306 11/16/14 0254 11/17/14 0700  WBC 15.6* 12.7* 13.6* 14.5*  HGB 12.8 11.2* 11.1* 11.5*  HCT 39.0 33.0* 33.1* 35.4*  MCV 83.2 82.7 83.8 83.9  PLT 428* 357 356 364   Cardiac Enzymes: No results for input(s): CKTOTAL, CKMB, CKMBINDEX, TROPONINI in the last 168 hours. BNP (last 3 results) No results for input(s): BNP in the last 8760 hours.  ProBNP (last 3 results)  Recent Labs  09/03/14 0925  PROBNP 37.0    CBG: No results for input(s): GLUCAP in  the last 168 hours.  Recent Results (from the past 240 hour(s))  Culture, blood (routine x 2)     Status: None   Collection Time: 11/14/14  6:59 PM  Result Value Ref Range Status   Specimen Description BLOOD RIGHT HAND  Final   Special Requests IN PEDIATRIC BOTTLE 1CC  Final   Culture NO GROWTH 5 DAYS  Final   Report Status 11/19/2014 FINAL  Final  Culture, blood (routine x 2)     Status: None   Collection Time: 11/14/14  7:03 PM  Result Value Ref Range Status   Specimen Description BLOOD LEFT HAND  Final   Special Requests BOTTLES  DRAWN AEROBIC AND ANAEROBIC 5CC  Final   Culture NO GROWTH 5 DAYS  Final   Report Status 11/19/2014 FINAL  Final     Studies: No results found.  Scheduled Meds: . docusate sodium  100 mg Oral BID  . donepezil  10 mg Oral QHS  . enoxaparin (LOVENOX) injection  40 mg Subcutaneous Q24H  . feeding supplement (ENSURE ENLIVE)  237 mL Oral TID BM  . losartan  50 mg Oral Daily  . multivitamin with minerals  1 tablet Oral Daily  . sodium chloride  3 mL Intravenous Q12H   Continuous Infusions:   Time spent: > 35 minutes  Penny Pia  [facility] Pager [phone]. If 7PM-7AM, please contact night-coverage at [email or web address], password [facility] 11/19/2014, 4:51 PM  LOS: 5 days

## 2014-11-19 NOTE — Progress Notes (Signed)
Physical Therapy Treatment Patient Details Name: [name] Frank MRN: [id number] DOB: 1934-09-16 Today's Date: 11/19/2014    History of Present Illness Pt admitted with stage 4 sacral decubitus ulcer with osteomyelitis.    PT Comments    Patient progressing well towards PT goals. Pt with very slow gait speed putting pt at increased risk for falls. Tolerated stair training this AM with Min guard assist for safety. Education re: walking multiple times per day to help with strengthening and getting off bottom. Instructed pt in exercises for pressure relief for sacral ulcer. Will continue to follow acutely per current POC.   Follow Up Recommendations  Supervision/Assistance - 24 hour     Equipment Recommendations  Rolling walker with 5" wheels    Recommendations for Other Services       Precautions / Restrictions Precautions Precautions: Fall Restrictions Weight Bearing Restrictions: No    Mobility  Bed Mobility               General bed mobility comments: Pt standing in bathroom getting washed up with tech upon PT arrival.   Transfers Overall transfer level: Needs assistance Equipment used: Rolling walker (2 wheeled) Transfers: Sit to/from Stand Sit to Stand: Min guard         General transfer comment: Min guard for safety. Transferred to chair post ambulation bout.  Ambulation/Gait Ambulation/Gait assistance: Min guard Ambulation Distance (Feet): 300 Feet Assistive device: Rolling walker (2 wheeled) Gait Pattern/deviations: Step-through pattern;Decreased stride length;Trunk flexed Gait velocity: .96 ft/sec Gait velocity interpretation: <1.8 ft/sec, indicative of risk for recurrent falls General Gait Details: Cues to increase cadence and for upright posture having pt read signs and room numbers to elicit. No LOB.   Stairs Stairs: Yes Stairs assistance: Min guard Stair Management: Step to pattern;One rail Left Number of Stairs: 3 General stair  comments: Cues for technique/safety. No LOB.   Wheelchair Mobility    Modified Rankin (Stroke Patients Only)       Balance Overall balance assessment: Needs assistance Sitting-balance support: Feet supported;No upper extremity supported Sitting balance-Leahy Scale: Fair     Standing balance support: During functional activity Standing balance-Leahy Scale: Poor Standing balance comment: Able to stand with BUE support on RW during changing gown, clean up without LOB or difficulty.                     Cognition Arousal/Alertness: Awake/alert Behavior During Therapy: WFL for tasks assessed/performed Overall Cognitive Status: History of cognitive impairments - at baseline       Memory: Decreased short-term memory              Exercises Other Exercises Other Exercises: chair dips x3 for strengthening and pressure relief on bottom    General Comments General comments (skin integrity, edema, etc.): Sacral decubitus ulcer. Removed bandage due to stool. Cleaned up and applied new bandage.      Pertinent Vitals/Pain Pain Assessment: No/denies pain    Home Living                      Prior Function            PT Goals (current goals can now be found in the care plan section) Progress towards PT goals: Progressing toward goals    Frequency  Min 3X/week    PT Plan Current plan remains appropriate    Co-evaluation             End of Session Equipment Utilized  During Treatment: Gait belt Activity Tolerance: Patient tolerated treatment well Patient left: in chair;with call bell/phone within reach;with chair alarm set;Other (comment)     Time: 1610-9604 PT Time Calculation (min) (ACUTE ONLY): 25 min  Charges:  $Gait Training: 23-37 mins                    G Codes:      Long Brimage A Glenda Spelman 11/19/2014, 11:18 AM Mylo Red, PT, DPT [phone]

## 2014-11-19 NOTE — Care Management Note (Signed)
Case Management Note  Patient Details  Name: CELIA FRIEDLAND MRN: [id number] Date of Birth: [birth year]  Subjective/Objective:                    Action/Plan:   Expected Discharge Date:                  Expected Discharge Plan:  Home w Home Health Services  In-House Referral:     Discharge planning Services  CM Consult  Post Acute Care Choice:    Choice offered to:  Adult Children  DME Arranged:  Government social research officer DME Agency:  [facility]  HH Arranged:  RN, PT, OT, Nurse's Aide HH Agency:  [facility]  Status of Service:  In process, will continue to follow  Medicare Important Message Given:  Yes-second notification given Date Medicare IM Given:    Medicare IM give by:    Date Additional Medicare IM Given:    Additional Medicare Important Message give by:     If discussed at Long Length of Stay Meetings, dates discussed:  11-19-14   Additional Comments:  Kingsley Plan, RN 11/19/2014, 3:18 PM

## 2014-11-19 NOTE — Progress Notes (Signed)
   INFECTIOUS DISEASE PROGRESS NOTE  ID: [name] Frank is a 80 y.o. female with  Principal Problem:   Decubitus ulcer of sacral region, stage 4 Active Problems:   Essential hypertension   Osteoarthritis   Protein-calorie malnutrition   Stage IV decubitus ulcer   Protein-calorie malnutrition, severe   Osteomyelitis, pelvic region and thigh   Goals of care, counseling/discussion   Enterococcus faecalis infection  Subjective: In commode, no complaints  Abtx:  Anti-infectives    Start     Dose/Rate Route Frequency Ordered Stop   11/14/14 2200  amoxicillin-clavulanate (AUGMENTIN) 875-125 MG per tablet 1 tablet  Status:  Discontinued     1 tablet Oral 2 times daily 11/14/14 1737 11/16/14 1746      Medications:  Scheduled: . docusate sodium  100 mg Oral BID  . donepezil  10 mg Oral QHS  . enoxaparin (LOVENOX) injection  40 mg Subcutaneous Q24H  . feeding supplement (ENSURE ENLIVE)  237 mL Oral TID BM  . losartan  50 mg Oral Daily  . multivitamin with minerals  1 tablet Oral Daily  . sodium chloride  3 mL Intravenous Q12H    Objective: Vital signs in last 24 hours: Temp:  [98.4 F (36.9 C)-98.7 F (37.1 C)] 98.4 F (36.9 C) (09/06 1500) Pulse Rate:  [74-94] 74 (09/06 1500) Resp:  [16-18] 18 (09/06 1500) BP: (120-150)/(67-79) 140/79 mmHg (09/06 1500) SpO2:  [98 %-99 %] 98 % (09/06 1500)   General appearance: alert, cooperative and no distress  Lab Results  Recent Labs  11/17/14 0700  WBC 14.5*  HGB 11.5*  HCT 35.4*  NA 137  K 4.3  CL 103  CO2 27  BUN 7  CREATININE 0.64   Liver Panel No results for input(s): PROT, ALBUMIN, AST, ALT, ALKPHOS, BILITOT, BILIDIR, IBILI in the last 72 hours. Sedimentation Rate  Recent Labs  11/17/14 0700  ESRSEDRATE 40*   C-Reactive Protein  Recent Labs  11/17/14 0700  CRP 1.1*    Microbiology: Recent Results (from the past 240 hour(s))  Culture, blood (routine x 2)     Status: None   Collection Time:  11/14/14  6:59 PM  Result Value Ref Range Status   Specimen Description BLOOD RIGHT HAND  Final   Special Requests IN PEDIATRIC BOTTLE 1CC  Final   Culture NO GROWTH 5 DAYS  Final   Report Status 11/19/2014 FINAL  Final  Culture, blood (routine x 2)     Status: None   Collection Time: 11/14/14  7:03 PM  Result Value Ref Range Status   Specimen Description BLOOD LEFT HAND  Final   Special Requests BOTTLES DRAWN AEROBIC AND ANAEROBIC 5CC  Final   Culture NO GROWTH 5 DAYS  Final   Report Status 11/19/2014 FINAL  Final    Studies/Results: No results found.   Assessment/Plan: Sacral Decubitus Osteomyelitis  Total days of antibiotics: off  Await plastics eval Consider diversion of stool, urine Nutrition eval         Johny Sax Infectious Diseases (pager) [phone] [email or web address] 11/19/2014, 3:54 PM  LOS: 5 days

## 2014-11-20 ENCOUNTER — Encounter (HOSPITAL_COMMUNITY): Payer: Self-pay | Admitting: Physician Assistant

## 2014-11-20 DIAGNOSIS — E43 Unspecified severe protein-calorie malnutrition: Secondary | ICD-10-CM

## 2014-11-20 DIAGNOSIS — I10 Essential (primary) hypertension: Secondary | ICD-10-CM

## 2014-11-20 MED ORDER — VITAMIN C 500 MG PO TABS
500.0000 mg | ORAL_TABLET | Freq: Two times a day (BID) | ORAL | Status: DC
  Administered 2014-11-20 – 2014-11-26 (×11): 500 mg via ORAL
  Filled 2014-11-20 (×11): qty 1

## 2014-11-20 MED ORDER — SACCHAROMYCES BOULARDII 250 MG PO CAPS
250.0000 mg | ORAL_CAPSULE | Freq: Two times a day (BID) | ORAL | Status: DC
  Administered 2014-11-20 – 2014-11-26 (×11): 250 mg via ORAL
  Filled 2014-11-20 (×11): qty 1

## 2014-11-20 MED ORDER — ZINC SULFATE 220 (50 ZN) MG PO CAPS
220.0000 mg | ORAL_CAPSULE | Freq: Every day | ORAL | Status: DC
  Administered 2014-11-20 – 2014-11-26 (×6): 220 mg via ORAL
  Filled 2014-11-20 (×7): qty 1

## 2014-11-20 MED ORDER — BOOST / RESOURCE BREEZE PO LIQD
1.0000 | Freq: Three times a day (TID) | ORAL | Status: DC
  Administered 2014-11-20 – 2014-11-26 (×14): 1 via ORAL

## 2014-11-20 MED ORDER — ASCORBIC ACID 500 MG/ML IJ SOLN
500.0000 mg | Freq: Two times a day (BID) | INTRAMUSCULAR | Status: DC
  Administered 2014-11-20: 500 mg via INTRAVENOUS
  Filled 2014-11-20 (×3): qty 1

## 2014-11-20 NOTE — Progress Notes (Signed)
Nutrition Follow-up  DOCUMENTATION CODES:   Severe malnutrition in context of chronic illness, Underweight  INTERVENTION:   -D/c Ensure Enlive po TID, each supplement provides 350 kcal and 20 grams of protein -Boost Breeze po TID, each supplement provides 250 kcal and 9 grams of protein -Continue MVI, zinc, and vitamin C supplementation  NUTRITION DIAGNOSIS:   Malnutrition related to chronic illness as evidenced by severe depletion of body fat, severe depletion of muscle mass.  Ongoing  GOAL:   Patient will meet greater than or equal to 90% of their needs  Progressing  MONITOR:   PO intake, Supplement acceptance, Labs, Weight trends, Skin, I & O's  REASON FOR ASSESSMENT:   Consult Wound healing  ASSESSMENT:   [name] Frank is a 80 y.o. female with a past medical history of stage IV decubitus ulcer, hypertension, dyslipidemia, cognitive impairment, who presents as a transfer from her primary care physician's office. She was receiving care at the wound care center at [facility]. It appears cultures done at that facility on 10/30/2014 grew Enterococcus avium that was susceptible to ampicillin and was started on Augmentin therapy. Throughout this time she has denied having associated pain, fevers, chills, nausea, vomiting. At baseline family members tell me she is minimally active and requires assistance with activities of daily living. They state that she spends the majority or day sitting in her recliner or laying in bed. On 11/06/2014 x-ray revealed evidence of destruction within the distal sacrum and coccyx suspected to be secondary to infection. Radiology had recommended a further workup with CT or MRI. Her primary care physician recommended hospitalization to undergo workup and further treatment.  RD received another wound healing consult. Per staff, Ensure has been giving pt diarrhea. Per RN notes, pt had multiple loose bowel movements yesterday.    Spoke with pt and family members at bedside. They report pt with continued good appetite and are please that pt is receiving "softer foods"; pt has difficulty chewing meats when they are tough. Pt and family declined offer of mechanically altered diet downgrade. Meal completion 75-100%.   Per family members, pt was receiving Ensure PTA, however, they stopped giving it to her a few weeks ago, "because it just runs right through her". Reviewed alternative supplements. Pt and family agreeable with Boost Breeze supplement. Family refused additional offer of Prostat protein modular ("she won't take it if it doesn't taste good"). Pt and family very knowledgeable regarding the role of good nutrition to support wound healing. They expressed appreciation for visit.   Reviewed ID and plastics notes; recommend I&D and possible A-cell placement and bone biopsy, with potential for diverting colostomy. Also awaiting evaluation for C-diff due to diarrhea.   Labs reviewed.  Diet Order:  Diet Heart Room service appropriate?: Yes; Fluid consistency:: Thin  Skin:  Wound (see comment) (stage IV sacrum)  Last BM:  11/19/14  Height:   Ht Readings from Last 1 Encounters:  11/16/14  (1.702 m)    Weight:   Wt Readings from Last 1 Encounters:  11/16/14 114 lb (51.71 kg)    Ideal Body Weight:  61.4 kg  BMI:  Body mass index is 17.85 kg/(m^2).  Estimated Nutritional Needs:   Kcal:  1600-1800  Protein:  70-80 grams  Fluid:  >1.6-1.8 L  EDUCATION NEEDS:   Education needs addressed  [name] Frank A. Mayford Knife, RD, LDN, CDE Pager: [phone] After hours Pager: [phone]

## 2014-11-20 NOTE — Progress Notes (Addendum)
[facility] PROGRESS NOTE  [name] Frank:[id number] DOB: 08-30-1934 DOA: 11/14/2014 PCP: Kristian Covey, MD  Brief Summary  Patient is a 80 year old African-American female with history of dementia and limited mobility spending much of her time in chair with history of reduced sensation in pelvic area due to prior back surgery. Subsequently developed stage IV decubitus ulcer. Cultures done at [facility] on 10/30/2014 grew enterococcus species which was susceptible to ampicillin and patient was started on Augmentin therapy. Was transferred here at the request of her primary care physician for further evaluation of sacral decubitus ulcer  Assessment/Plan  Decubitus ulcer of sacral region, stage 4 with osteomyelitis -  Appreciate ID, plastic surgery, and general surgery assistance -  abx discontinued per ID in anticipation of biopsy later this week -  Plan for biopsy with Acell placement and diverting colostomy on Friday -  Consideration of period of foley catheter placement while on antibiotics for her osteomyelitis to also help with wound healing  1-2 loose stools per day chronically, not typical of C. Diff diarrhea -  Agree may be related to her ensure -  Stop colace and senna  Essential hypertension, stable - continue losartan  Osteoarthritis - stable  Severe protein-calorie malnutrition - continue nutritional supplementation per nutrition recommendations - change to regular diet  Petechial rash - resolved after discontinuing antibiotic and amlodipine. Etiology uncertain  Diet:  regular Access:  PIV IVF:  off Proph:  lovenox  Code Status: full Family Communication: patient alone Disposition Plan: possible surgery on Friday, however, I think the patient and family should also meet with palliative care before committing to surgery.  Patient is demented, severely malnourished, and mostly bedbound with relatively poor quality of life.  Anterior  colostomy may be difficult to maintain if her dementia (as all dementias do) continues to worsen.  Family needs to be aware of more conservative options and fully aware of risks of these surgeries given her comorbidities.  Have not discussed code status, but would be reasonable to recommend DNR in these circumstances.     Consultants:  ID Dr. Ninetta Lights  Plastic surgery, Dr. Kelly Splinter  General Surgery, Dr. Janee Morn  Procedures:  CT abdomen and pelvis 9/2  MR pelvis 9/2  Antibiotics:  Augmentin 9/1 > 9/3  HPI/Subjective:  Has pain on her bottom, but denies SOB, cough, nausea, vomiting.      Objective: Filed Vitals:   11/19/14 2213 11/20/14 0616 11/20/14 1115 11/20/14 1500  BP: 130/75 141/67 126/63 126/57  Pulse: 85 81 82 98  Temp: 99.1 F (37.3 C) 98.7 F (37.1 C)  98.1 F (36.7 C)  TempSrc: Oral Oral  Oral  Resp: 18 18  18   Height:      Weight:      SpO2: 100% 100%  100%    Intake/Output Summary (Last 24 hours) at 11/20/14 1642 Last data filed at 11/20/14 1253  Gross per 24 hour  Intake    840 ml  Output      0 ml  Net    840 ml   Filed Weights   11/16/14 2045  Weight: 51.71 kg (114 lb)   Body mass index is 17.85 kg/(m^2).  Exam:   General:  Cachectic female, No acute distress, lying in bed, unable to sit up in bed for exam.    HEENT:  NCAT, MMM  Cardiovascular:  RRR, nl S1, S2 no mrg, 2+ pulses, warm extremities  Respiratory:  CTAB anteriorly, no increased WOB  Abdomen:  NABS, soft, NT/ND  MSK:   Decreased tone and bulk, no LEE  Data Reviewed: Basic Metabolic Panel:  Recent Labs Lab 11/14/14 1903 11/15/14 0306 11/16/14 1315 11/17/14 0700  NA 139 138  --  137  K 3.2* 3.0* 3.6 4.3  CL 102 105  --  103  CO2 27 25  --  27  GLUCOSE 125* 82  --  92  BUN 8 6  --  7  CREATININE 0.91 0.77  --  0.64  CALCIUM 8.3* 8.1*  --  8.4*   Liver Function Tests:  Recent Labs Lab 11/14/14 1903  AST 22  ALT 10*  ALKPHOS 57  BILITOT 0.5  PROT 6.2*   ALBUMIN 2.8*   No results for input(s): LIPASE, AMYLASE in the last 168 hours. No results for input(s): AMMONIA in the last 168 hours. CBC:  Recent Labs Lab 11/14/14 1903 11/15/14 0306 11/16/14 0254 11/17/14 0700  WBC 15.6* 12.7* 13.6* 14.5*  HGB 12.8 11.2* 11.1* 11.5*  HCT 39.0 33.0* 33.1* 35.4*  MCV 83.2 82.7 83.8 83.9  PLT 428* 357 356 364    Recent Results (from the past 240 hour(s))  Culture, blood (routine x 2)     Status: None   Collection Time: 11/14/14  6:59 PM  Result Value Ref Range Status   Specimen Description BLOOD RIGHT HAND  Final   Special Requests IN PEDIATRIC BOTTLE 1CC  Final   Culture NO GROWTH 5 DAYS  Final   Report Status 11/19/2014 FINAL  Final  Culture, blood (routine x 2)     Status: None   Collection Time: 11/14/14  7:03 PM  Result Value Ref Range Status   Specimen Description BLOOD LEFT HAND  Final   Special Requests BOTTLES DRAWN AEROBIC AND ANAEROBIC 5CC  Final   Culture NO GROWTH 5 DAYS  Final   Report Status 11/19/2014 FINAL  Final     Studies: No results found.  Scheduled Meds: . ascorbic acid  500 mg Intravenous BID  . docusate sodium  100 mg Oral BID  . donepezil  10 mg Oral QHS  . enoxaparin (LOVENOX) injection  40 mg Subcutaneous Q24H  . feeding supplement  1 Container Oral TID BM  . losartan  50 mg Oral Daily  . multivitamin with minerals  1 tablet Oral Daily  . sodium chloride  3 mL Intravenous Q12H  . zinc sulfate  220 mg Oral Daily   Continuous Infusions:   Principal Problem:   Decubitus ulcer of sacral region, stage 4 Active Problems:   Essential hypertension   Osteoarthritis   Protein-calorie malnutrition   Stage IV decubitus ulcer   Protein-calorie malnutrition, severe   Osteomyelitis, pelvic region and thigh   Goals of care, counseling/discussion   Enterococcus faecalis infection    Time spent: 30 min    Shirah Roseman, [facility]  [facility] Pager [phone]. If 7PM-7AM, please contact night-coverage  at [email or web address], password [facility] 11/20/2014, 4:42 PM  LOS: 6 days

## 2014-11-20 NOTE — Consult Note (Signed)
[name] Frank 07-Jan-1935  [id number].   Primary Care MD: Dr. Evelena Peat Requesting MD: Dr. Wayland Denis Chief Complaint/Reason for Consult: decubitus ulcer need for diverting colostomy HPI: This is a 80 yo black female who has some dementia and is unable to provide her history.  Her caretaker provides most of her history.  The patient has some neurologic surgery years ago and has since has some neurologic dysfunction.  She has a neurogenic bowel and bladder and also has some weakness with mobilization.  She is somewhat mobile with a cane, but mostly sits in a recliner all day.  She is somewhat insensate of her sacrum and was not aware that she was developing a decubitus ulcer.  The caretaker noticed this about a month ago, but was unable to determine how long it has actually been there.  She has been seeing the wound care center in [place] for management.  She does occasional soil into this wound per the caretaker.  The patient apparently has some issues with one of her hips and needs a hip replacement, but the surgeon will not operate on her until her sacral wound has resolved.  Plastics was initially consulted due to a concern for osteomyelitis of her coccyx as well as for management of her wound.  They have asked [place] to see her for a possible diverting colostomy.  ROS : Please see HPI, otherwise all other systems are currently negative right now.  Family History  Problem Relation Age of Onset  . Diabetes Mother   . Alcohol abuse      fhx  . Arthritis      fhx  . Hypertension      fhx  . Heart disease      fhx  . Cancer      colon, lung, prostate/fhx  . Stroke      fhx  . Depression      fhx    Past Medical History  Diagnosis Date  . Allergy   . Hyperlipidemia   . Hypertension   . Anxiety   . GERD (gastroesophageal reflux disease)   . Urinary frequency   . Neurogenic urinary incontinence     since back surgery  . Arthritis     degenerative oateoarthritis  .  Decubital ulcer 11/14/2014  . Dementia     Past Surgical History  Procedure Laterality Date  . Appendectomy    . Cholecystectomy    . Abdominal hysterectomy    . Back surgery  2000  . Colonoscopy w/ polypectomy    . Breast surgery  1993    biopsy  . Total hip arthroplasty  01/07/2012    Procedure: TOTAL HIP ARTHROPLASTY;  Surgeon: Thera Flake., MD;  Location: [facility] OR;  Service: Orthopedics;  Laterality: Right;    Social History:  reports that she quit smoking about 31 years ago. Her smoking use included Cigarettes. She has a 5 pack-year smoking history. She has never used smokeless tobacco. She reports that she does not drink alcohol or use illicit drugs.  Allergies:  Allergies  Allergen Reactions  . Codeine Sulfate Other (See Comments)    stomach pains  . Tramadol Hcl Other (See Comments)    stomach cramps    Medications Prior to Admission  Medication Sig Dispense Refill  . amLODipine (NORVASC) 5 MG tablet TAKE 1 TABLET BY MOUTH EVERY DAY 90 tablet 2  . amoxicillin-clavulanate (AUGMENTIN) 875-125 MG per tablet Take 1 tablet by mouth 2 (two) times daily.  1  . donepezil (ARICEPT) 10 MG tablet Take 1 tablet (10 mg total) by mouth at bedtime. 30 tablet 11  . furosemide (LASIX) 20 MG tablet TAKE 1 TABLET (20 MG TOTAL) BY MOUTH DAILY. 30 tablet 5  . losartan (COZAAR) 50 MG tablet TAKE 1 TABLET BY MOUTH DAILY 90 tablet 2  . megestrol (MEGACE ORAL) 40 MG/ML suspension One tsp po bid 480 mL 1  . oxyCODONE-acetaminophen (PERCOCET/ROXICET) 5-325 MG per tablet Take 1 tablet by mouth every 6 (six) hours as needed for severe pain. 30 tablet 0  . vitamin B-12 (CYANOCOBALAMIN) 500 MCG tablet Take 500 mcg by mouth daily.      Blood pressure 126/63, pulse 82, temperature 98.7 F (37.1 C), temperature source Oral, resp. rate 18, height  (1.702 m), weight 51.71 kg (114 lb), SpO2 100 %. Physical Exam: General: pleasant, somewhat frail appearing black female who is laying in bed in  NAD HEENT: head is normocephalic, atraumatic.  Sclera are noninjected.  PERRL.  Ears and nose without any masses or lesions.  Mouth is pink and moist Heart: regular, rate, and rhythm.  Normal s1,s2. No obvious murmurs, gallops, or rubs noted.  Palpable radial and pedal pulses bilaterally Lungs: CTAB, no wheezes, rhonchi, or rales noted.  Respiratory effort nonlabored Abd: soft, NT, ND, +BS, no masses, hernias, or organomegaly, lower pfannenstiel scar noted Skin: stage 4 sacral wound.  Approximately 5x5cm with mostly pink granulation tissue present.  No overt evidence of infection.   MS: all 4 extremities are symmetrical with no cyanosis, clubbing, or edema.  Somewhat cachectic   Skin: warm and dry with no masses, lesions, or rashes Psych: A&O, but forgetful and unable to provide much history, but very pleasant    No results found for this or any previous visit (from the past 48 hour(s)). No results found.     Assessment/Plan 1. Stage 4 sacral decubitus ulcer -patient has a neurogenic bowel and bladder causing her to be incontinent and causing her wound to become contaminated with stool.  This is several cm away from her rectum, but given his limited mobility etc, it would likely help with stool management as well as wound healing to divert her stool. -we will discuss this with her husband when he arrives to see if he is agreeable -plastics asked if we took her to the OR if we would go ahead and done the bone biopsy of her coccyx as well.  We have agreed to do this too.  Likely plan for Friday at this time.  [name] 11/20/2014, 2:12 PM Pager: [phone]

## 2014-11-20 NOTE — Progress Notes (Signed)
   INFECTIOUS DISEASE PROGRESS NOTE  ID: [name] Frank is a 80 y.o. female with  Principal Problem:   Decubitus ulcer of sacral region, stage 4 Active Problems:   Essential hypertension   Osteoarthritis   Protein-calorie malnutrition   Stage IV decubitus ulcer   Protein-calorie malnutrition, severe   Osteomyelitis, pelvic region and thigh   Goals of care, counseling/discussion   Enterococcus faecalis infection  Subjective: Without complaints  Abtx:  Anti-infectives    Start     Dose/Rate Route Frequency Ordered Stop   11/14/14 2200  amoxicillin-clavulanate (AUGMENTIN) 875-125 MG per tablet 1 tablet  Status:  Discontinued     1 tablet Oral 2 times daily 11/14/14 1737 11/16/14 1746      Medications:  Scheduled: . ascorbic acid  500 mg Intravenous BID  . docusate sodium  100 mg Oral BID  . donepezil  10 mg Oral QHS  . enoxaparin (LOVENOX) injection  40 mg Subcutaneous Q24H  . feeding supplement  1 Container Oral TID BM  . losartan  50 mg Oral Daily  . multivitamin with minerals  1 tablet Oral Daily  . sodium chloride  3 mL Intravenous Q12H  . zinc sulfate  220 mg Oral Daily    Objective: Vital signs in last 24 hours: Temp:  [98.1 F (36.7 C)-99.1 F (37.3 C)] 98.1 F (36.7 C) (09/07 1500) Pulse Rate:  [81-98] 98 (09/07 1500) Resp:  [18] 18 (09/07 1500) BP: (126-141)/(57-75) 126/57 mmHg (09/07 1500) SpO2:  [100 %] 100 % (09/07 1500)   General appearance: alert, cooperative and no distress Resp: clear to auscultation bilaterally Cardio: regular rate and rhythm and systolic murmur: early systolic 3/6, crescendo at 2nd right intercostal space GI: normal findings: bowel sounds normal and soft, non-tender  Lab Results  No results for input(s): WBC, HGB, HCT, NA, K, CL, CO2, BUN, CREATININE, GLU in the last 72 hours.  Invalid input(s): PLATELETS Liver Panel No results for input(s): PROT, ALBUMIN, AST, ALT, ALKPHOS, BILITOT, BILIDIR, IBILI in the last 72  hours. Sedimentation Rate No results for input(s): ESRSEDRATE in the last 72 hours. C-Reactive Protein No results for input(s): CRP in the last 72 hours.  Microbiology: Recent Results (from the past 240 hour(s))  Culture, blood (routine x 2)     Status: None   Collection Time: 11/14/14  6:59 PM  Result Value Ref Range Status   Specimen Description BLOOD RIGHT HAND  Final   Special Requests IN PEDIATRIC BOTTLE 1CC  Final   Culture NO GROWTH 5 DAYS  Final   Report Status 11/19/2014 FINAL  Final  Culture, blood (routine x 2)     Status: None   Collection Time: 11/14/14  7:03 PM  Result Value Ref Range Status   Specimen Description BLOOD LEFT HAND  Final   Special Requests BOTTLES DRAWN AEROBIC AND ANAEROBIC 5CC  Final   Culture NO GROWTH 5 DAYS  Final   Report Status 11/19/2014 FINAL  Final    Studies/Results: No results found.   Assessment/Plan: Sacral Decubitus Osteomyelitis  Total days of antibiotics: off  Appreciate plastics eval Diverting colostomy, bone bx planned tentatively for Friday.   Appreciate Nutrition eval Explained to family         Johny Sax Infectious Diseases (pager) [phone] [email or web address] 11/20/2014, 3:45 PM  LOS: 6 days

## 2014-11-20 NOTE — Progress Notes (Signed)
Occupational Therapy Treatment Patient Details Name: [name] Frank MRN: [id number] DOB: [birth year] Today's Date: 11/20/2014    History of present illness Pt admitted with stage 4 sacral decubitus ulcer with osteomyelitis.   OT comments  Upon arrival, pt found lying in chair with family members presents. Focus of session included toilet transfer and functional mobility required for ADL independence. Pt performed sit<>stand transfer from 3-in-1 BSC requiring min A and verbal cues for hand placement using rolling walker. During toileting, pt required min A for donning/doffing incontinence briefs and min verbal cues for sequcening and safety. Pt performed hand hygiene with supervision required verbal cues for locating soap. Once EOB pt required verbal cues for sequencing to perform sit to supine position and min A from therapist of lifting bilateral LE onto bed. Therapist noted fatigue as well as bilateral UE weakness during transfers.  Follow Up Recommendations  Home health OT    Equipment Recommendations  None recommended by OT    Recommendations for Other Services      Precautions / Restrictions Precautions Precautions: Fall Restrictions Weight Bearing Restrictions: No       Mobility Bed Mobility Overal bed mobility: Needs Assistance Bed Mobility: Sit to Supine       Sit to supine: Min assist (Pt required verbal cues sequencing and min A for LE on bed)   General bed mobility comments: Pt found in chair   Transfers Overall transfer level: Needs assistance Equipment used: Rolling walker (2 wheeled) Transfers: Sit to/from Stand Sit to Stand: Min assist         General transfer comment: Pt required min A for transfer due to noted B UE weakness    Balance                                   ADL Overall ADL's : Needs assistance/impaired                         Toilet Transfer: Minimal assistance;Ambulation;RW;BSC Toilet Transfer Details  (indicate cue type and reason): verbal cues for sequencing and hand placement. Required assistance to lift gown Toileting- Clothing Manipulation and Hygiene: Supervision/safety;Minimal assistance;Sit to/from stand;Cueing for sequencing Toileting - Clothing Manipulation Details (indicate cue type and reason):  (Pt requires min A for donning briefs)     Functional mobility during ADLs: Minimal assistance;Cueing for safety;Cueing for sequencing;Rolling walker General ADL Comments: Pt requires assistance for sequencing and safety      Vision                     Perception     Praxis      Cognition   Behavior During Therapy: WFL for tasks assessed/performed Overall Cognitive Status: History of cognitive impairments - at baseline       Memory: Decreased short-term memory               Extremity/Trunk Assessment               Exercises     Shoulder Instructions       General Comments      Pertinent Vitals/ Pain       Pain Assessment: No/denies pain  Home Living  Prior Functioning/Environment              Frequency Min 2X/week     Progress Toward Goals  OT Goals(current goals can now be found in the care plan section)  Progress towards OT goals: Progressing toward goals  Acute Rehab OT Goals Patient Stated Goal: family wants pt to be stronger ADL Goals Pt Will Transfer to Toilet: with supervision;ambulating Additional ADL Goal #1: Pt will be supervision - min guard for functional mobility needed for participation in ADL activities and functional transfers upon d/c  Plan Discharge plan needs to be updated    Co-evaluation                 End of Session Equipment Utilized During Treatment: Gait belt;Rolling walker   Activity Tolerance Patient tolerated treatment well (Pt seemed more fatigue than prior sessions)   Patient Left in bed;with call bell/phone within reach;with  bed alarm set;with family/visitor present   Nurse Communication Other (comment) (Pt bandage came off during toileting, therapist replaced)        Time: 1610-9604 OT Time Calculation (min): 20 min  Charges:    Smiley Houseman 11/20/2014, 12:20 PM

## 2014-11-20 NOTE — Consult Note (Signed)
Reason for Consult:Stage IV sacral decubitus ulcer Referring Physician: Dr. Clovis Riley [name] Frank is an 80 y.o. female.  HPI:  [name] Frank is a 80 y.o. female with a past medical history of stage IV decubitus ulcer, hypertension, dyslipidemia, history of neurogenic bowel and bladder with incontinence, and cognitive impairment, who was admitted 11/14/2014 as a transfer from her primary care physician's office. She was receiving care at the wound care center at [facility] and cultures done at that facility on 10/30/2014 grew Enterococcus avium  that was susceptible to ampicillin and she was started on Augmentin therapy.  Family members report she is minimally active and requires assistance with activities of daily living. They state that she spends the majority or day sitting in her recliner or laying in bed. She has had sensory changes over her pelvis and lower extremities following spine surgery some years ago and has been incontinent of bowel and bladder as well. They report she developed a small area of breakdown a few months ago and then about 2-3 months ago noted the wound had grown much larger and they began going to the Wound care clinic in [place] weekly for care.  An MRI scan during this admission showed osteomyelitis of the coccygeal segments. We are asked to see for wound management and bone biopsy. Pre-albumin this admission was 13.4. The family reports frequent stools and diarrhea with the Ensure supplements. There has been frequent contamination of the wound with stool.   Past Medical History  Diagnosis Date  . Allergy   . Hyperlipidemia   . Hypertension   . Anxiety   . GERD (gastroesophageal reflux disease)   . Urinary frequency   . Neurogenic urinary incontinence     since back surgery  . Arthritis     degenerative oateoarthritis  . Decubital ulcer 11/14/2014  . Dementia     Past Surgical History  Procedure Laterality Date  . Appendectomy     . Cholecystectomy    . Abdominal hysterectomy    . Back surgery  2000  . Colonoscopy w/ polypectomy    . Breast surgery  1993    biopsy  . Total hip arthroplasty  01/07/2012    Procedure: TOTAL HIP ARTHROPLASTY;  Surgeon: Thera Flake., MD;  Location: [facility] OR;  Service: Orthopedics;  Laterality: Right;    Family History  Problem Relation Age of Onset  . Diabetes Mother   . Alcohol abuse      fhx  . Arthritis      fhx  . Hypertension      fhx  . Heart disease      fhx  . Cancer      colon, lung, prostate/fhx  . Stroke      fhx  . Depression      fhx    Social History:  reports that she quit smoking about 31 years ago. Her smoking use included Cigarettes. She has a 5 pack-year smoking history. She has never used smokeless tobacco. She reports that she does not drink alcohol or use illicit drugs.  Allergies:  Allergies  Allergen Reactions  . Codeine Sulfate Other (See Comments)    stomach pains  . Tramadol Hcl Other (See Comments)    stomach cramps    Medications: I have reviewed the patient's current medications.  No results found for this or any previous visit (from the past 48 hour(s)).  No results found.  ROS Blood pressure 141/67, pulse 81, temperature 98.7  F (37.1 C), temperature source Oral, resp. rate 18, height 5\' 7"  (1.702 m), weight 51.71 kg (114 lb), SpO2 100 %. Physical Exam  Constitutional: No distress.  Thin, frail appearing elderly female sitting up in chair.  Pleasantly confused  HENT:  Head: Normocephalic and atraumatic.  Nose: Nose normal.  Mouth/Throat: Oropharynx is clear and moist.  Eyes: EOM are normal. Pupils are equal, round, and reactive to light.  Neck: Normal range of motion. Neck supple. No tracheal deviation present.  Cardiovascular: Normal rate, regular rhythm and intact distal pulses.   Respiratory: Effort normal and breath sounds normal. No stridor. No respiratory distress.  GI: Soft. She exhibits no distension and no mass.  There is no tenderness.  Musculoskeletal: Normal range of motion. She exhibits no edema or tenderness.  Neurological: She is alert. No cranial nerve deficit.  Sensory changes in pelvis and lower extremities, but motor intact.   Skin: Skin is warm and dry. No rash noted. No erythema.  Sacral decubitus ulcer which measures about 5 x 6 at the opening and has nearly circumferential undermining of at least 3-4 cm and tracking medially to the bone about 5-7 cm. There is yellow slough present and pale pink wound bed without clear granulation. There is no foul odor. She has no pain in the area and minimal sensation.     Assessment/Plan: Stage IV sacral decubitus with underlying osteomyelitis of the coccyx- Recommend I&D and possible Acell placement and bone biopsy in the OR. She will likely need diverting colostomy as well.  She is not tolerating the protein supplement, so will see if nutrition has other things that she can try.  She likely needs evaluation for C diff if having diarrhea.  Will add MVI, Vitamin C, Zinc  Will follow up with timing for surgery.   [name] Hehr,PA-C Plastic Surgery [phone]

## 2014-11-21 DIAGNOSIS — Z515 Encounter for palliative care: Secondary | ICD-10-CM

## 2014-11-21 DIAGNOSIS — Z7189 Other specified counseling: Secondary | ICD-10-CM

## 2014-11-21 LAB — SURGICAL PCR SCREEN
MRSA, PCR: NEGATIVE
STAPHYLOCOCCUS AUREUS: NEGATIVE

## 2014-11-21 LAB — CREATININE, SERUM
Creatinine, Ser: 0.69 mg/dL (ref 0.44–1.00)
GFR calc Af Amer: >60 mL/min (ref >60)
GFR calc non Af Amer: >60 mL/min (ref >60)

## 2014-11-21 MED ORDER — ENOXAPARIN SODIUM 40 MG/0.4ML ~~LOC~~ SOLN
40.0000 mg | SUBCUTANEOUS | Status: DC
  Administered 2014-11-22 – 2014-11-25 (×4): 40 mg via SUBCUTANEOUS
  Filled 2014-11-21 (×5): qty 0.4

## 2014-11-21 MED ORDER — WHITE PETROLATUM GEL
Status: AC
  Administered 2014-11-21: 15:00:00
  Filled 2014-11-21: qty 1

## 2014-11-21 MED ORDER — DEXTROSE 5 % IV SOLN: 1.0000 g | INTRAVENOUS | Status: DC

## 2014-11-21 NOTE — Progress Notes (Signed)
[facility] PROGRESS NOTE  DELICIA BERENS RUE:[id number] DOB: 1935/03/15 DOA: 11/14/2014 PCP: Kristian Covey, MD  Brief Summary  Patient is a 80 year old African-American female with history of dementia and limited mobility spending much of her time in chair with history of reduced sensation in pelvic area due to prior back surgery. Subsequently developed stage IV decubitus ulcer. Cultures done at [facility] on 10/30/2014 grew enterococcus species which was susceptible to ampicillin and patient was started on Augmentin therapy. Was transferred here at the request of her primary care physician for further evaluation of sacral decubitus ulcer  Assessment/Plan  Decubitus ulcer of sacral region, stage 4 with osteomyelitis -  Appreciate ID, plastic surgery, and general surgery assistance -  abx discontinued per ID in anticipation of biopsy later this week -  Plan for biopsy with Acell placement and diverting colostomy on Friday -  Consideration of period of foley catheter placement while on antibiotics for her osteomyelitis to also help with wound healing  1-2 loose stools per day chronically, not typical of C. Diff diarrhea -  Agree may be related to her ensure  Essential hypertension, stable - continue losartan  Osteoarthritis - stable  Severe protein-calorie malnutrition - continue nutritional supplementation per nutrition recommendations - change to regular diet  Petechial rash - resolved after discontinuing antibiotic and amlodipine. Etiology uncertain  Diet:  regular Access:  PIV IVF:  off Proph:  lovenox  Code Status: full Family Communication: patient alone Disposition Plan: surgery on Friday, anticipate discharge to SNF  Consultants:  ID Dr. Ninetta Lights  Plastic surgery, Dr. Kelly Splinter  General Surgery, Dr. Janee Morn  Procedures:  CT abdomen and pelvis 9/2  MR pelvis 9/2  Antibiotics:  Augmentin 9/1 > 9/3  HPI/Subjective:  Feels well,  denies SOB, cough, nausea, vomiting.      Objective: Filed Vitals:   11/20/14 1500 11/20/14 2250 11/21/14 0544 11/21/14 1305  BP: 126/57 130/61 145/74 120/73  Pulse: 98 84 93 88  Temp: 98.1 F (36.7 C) 98.3 F (36.8 C) 98.2 F (36.8 C) 98.7 F (37.1 C)  TempSrc: Oral Oral Oral Axillary  Resp: 18 18 20 18   Height:      Weight:      SpO2: 100% 100% 95% 100%    Intake/Output Summary (Last 24 hours) at 11/21/14 1453 Last data filed at 11/21/14 0745  Gross per 24 hour  Intake    723 ml  Output      0 ml  Net    723 ml   Filed Weights   11/16/14 2045  Weight: 51.71 kg (114 lb)   Body mass index is 17.85 kg/(m^2).  Exam:   General:  Cachectic female, No acute distress, sitting in chair.  Also seen ambulating slowly in hallway    HEENT:  NCAT, MMM  Cardiovascular:  RRR, nl S1, S2 no mrg, 2+ pulses, warm extremities  Respiratory:  CTAB anteriorly, no increased WOB  Abdomen:   NABS, soft, NT/ND  MSK:   Decreased tone and bulk, no LEE  Deferred wound exam today since sitting in chair  Data Reviewed: Basic Metabolic Panel:  Recent Labs Lab 11/14/14 1903 11/15/14 0306 11/16/14 1315 11/17/14 0700 11/21/14 0445  NA 139 138  --  137  --   K 3.2* 3.0* 3.6 4.3  --   CL 102 105  --  103  --   CO2 27 25  --  27  --   GLUCOSE 125* 82  --  92  --  BUN 8 6  --  7  --   CREATININE 0.91 0.77  --  0.64 0.69  CALCIUM 8.3* 8.1*  --  8.4*  --    Liver Function Tests:  Recent Labs Lab 11/14/14 1903  AST 22  ALT 10*  ALKPHOS 57  BILITOT 0.5  PROT 6.2*  ALBUMIN 2.8*   No results for input(s): LIPASE, AMYLASE in the last 168 hours. No results for input(s): AMMONIA in the last 168 hours. CBC:  Recent Labs Lab 11/14/14 1903 11/15/14 0306 11/16/14 0254 11/17/14 0700  WBC 15.6* 12.7* 13.6* 14.5*  HGB 12.8 11.2* 11.1* 11.5*  HCT 39.0 33.0* 33.1* 35.4*  MCV 83.2 82.7 83.8 83.9  PLT 428* 357 356 364    Recent Results (from the past 240 hour(s))  Culture,  blood (routine x 2)     Status: None   Collection Time: 11/14/14  6:59 PM  Result Value Ref Range Status   Specimen Description BLOOD RIGHT HAND  Final   Special Requests IN PEDIATRIC BOTTLE 1CC  Final   Culture NO GROWTH 5 DAYS  Final   Report Status 11/19/2014 FINAL  Final  Culture, blood (routine x 2)     Status: None   Collection Time: 11/14/14  7:03 PM  Result Value Ref Range Status   Specimen Description BLOOD LEFT HAND  Final   Special Requests BOTTLES DRAWN AEROBIC AND ANAEROBIC 5CC  Final   Culture NO GROWTH 5 DAYS  Final   Report Status 11/19/2014 FINAL  Final     Studies: No results found.  Scheduled Meds: . [START ON 11/22/2014] cefOXitin  1 g Intravenous To SSTC  . donepezil  10 mg Oral QHS  . enoxaparin (LOVENOX) injection  40 mg Subcutaneous Q24H  . feeding supplement  1 Container Oral TID BM  . losartan  50 mg Oral Daily  . multivitamin with minerals  1 tablet Oral Daily  . saccharomyces boulardii  250 mg Oral BID  . sodium chloride  3 mL Intravenous Q12H  . vitamin C  500 mg Oral BID  . zinc sulfate  220 mg Oral Daily   Continuous Infusions:   Principal Problem:   Decubitus ulcer of sacral region, stage 4 Active Problems:   Essential hypertension   Osteoarthritis   Protein-calorie malnutrition   Stage IV decubitus ulcer   Protein-calorie malnutrition, severe   Osteomyelitis, pelvic region and thigh   Goals of care, counseling/discussion   Enterococcus faecalis infection    Time spent: 30 min    Raveen Wieseler, [facility]  [facility] Pager [phone]. If 7PM-7AM, please contact night-coverage at [email or web address], password [email or web address] 11/21/2014, 2:53 PM  LOS: 7 days

## 2014-11-21 NOTE — Progress Notes (Signed)
Physical Therapy Treatment Patient Details Name: [name] Frank MRN: [id number] DOB: 1934/08/20 Today's Date: 11/21/2014    History of Present Illness Pt admitted with stage 4 sacral decubitus ulcer with osteomyelitis.    PT Comments    Pt agreeable to treatment session with focus on gait training in hallway to address overall endurance and functional strengthening as well as education on positioning and pressure relief (with pt and husband - pt with h/o cognitive impairments and unable to recall this information reviewed with prior therapist). See details below. Pt's husband expresses desire for SNF upon d/c to get the care she needs. D/c plan recommendation changed for SNF placement at this time. Pending sx for tomorrow for I&D. Will continue to follow to address deconditioning, balance, and safety with mobility.   Follow Up Recommendations  SNF;Supervision/Assistance - 24 hour (family expressing desire for SNF placement)     Equipment Recommendations  Rolling walker with 5" wheels    Recommendations for Other Services       Precautions / Restrictions Precautions Precautions: Fall Precaution Comments: Pressure relief every 30 min when OOB due to Stage IV wound; In bed roll to side every 1-2 hours Restrictions Weight Bearing Restrictions: No    Mobility  Bed Mobility               General bed mobility comments: Pt up in recliner already  Transfers Overall transfer level: Needs assistance Equipment used: Rolling walker (2 wheeled) Transfers: Sit to/from Stand Sit to Stand: Min assist         General transfer comment: Extra time and min A for sit to stand with cues for hand placement and anterior weightshift. Requires tactile cueing for initiation and balance (tendency for slight posterior lean during sit to stand)  Ambulation/Gait Ambulation/Gait assistance: Supervision Ambulation Distance (Feet): 150 Feet (x2) Assistive device: Rolling walker (2  wheeled) Gait Pattern/deviations: Step-through pattern;Trunk flexed     General Gait Details: cues for cadence and maintain upright posture; no LOB occurred. Cues for obtacle negotiation with RW at times in the room during turning   Stairs            Wheelchair Mobility    Modified Rankin (Stroke Patients Only)       Balance                                    Cognition Arousal/Alertness: Awake/alert Behavior During Therapy: WFL for tasks assessed/performed Overall Cognitive Status: History of cognitive impairments - at baseline       Memory: Decreased short-term memory              Exercises      General Comments General comments (skin integrity, edema, etc.): Educated pt and family (pt with dementia and no carryover of information) in regards to pressure relief every 30 min. Since pt is ambulatory, recommended for family to call nursing staff to assist pt with sit to stand/gait when pt up in w/c.      Pertinent Vitals/Pain Pain Assessment: No/denies pain    Home Living                      Prior Function            PT Goals (current goals can now be found in the care plan section) Acute Rehab PT Goals Patient Stated Goal: Husband expresses wanting pt to go to  SNF from here PT Goal Formulation: With family Time For Goal Achievement: 11/29/14 Potential to Achieve Goals: Fair Progress towards PT goals: Progressing toward goals    Frequency  Min 3X/week    PT Plan Discharge plan needs to be updated    Co-evaluation             End of Session   Activity Tolerance: Patient tolerated treatment well Patient left: in chair;with call bell/phone within reach;with family/visitor present     Time: 1005-1020 PT Time Calculation (min) (ACUTE ONLY): 15 min  Charges:  $Gait Training: 8-22 mins                    G Codes:      Delorise Royals, PT, DPT Pager #: [phone]  11/21/2014, 10:31 AM

## 2014-11-21 NOTE — Care Management Note (Signed)
Case Management Note  Patient Details  Name: DEONDRIA PURYEAR MRN: [id number] Date of Birth: [birth year]  Subjective/Objective:                    Action/Plan:  Plan for surgery tomorrow , SNF at discharge. Continue to follow .  Expected Discharge Date:                  Expected Discharge Plan:  Skilled Nursing Facility  In-House Referral:  Clinical Social Work  Discharge planning Services  CM Consult  Post Acute Care Choice:    Choice offered to:  Adult Children  DME Arranged:  Government social research officer DME Agency:  [facility]  HH Arranged:  RN, PT, OT, Nurse's Aide HH Agency:  [facility]  Status of Service:  In process, will continue to follow  Medicare Important Message Given:  Yes-second notification given Date Medicare IM Given:    Medicare IM give by:    Date Additional Medicare IM Given:    Additional Medicare Important Message give by:     If discussed at Long Length of Stay Meetings, dates discussed:  11-21-14  Additional Comments:  Kingsley Plan, RN 11/21/2014, 10:45 AM

## 2014-11-21 NOTE — Care Management Important Message (Signed)
Important Message  Patient Details  Name: NIRALI MAGOUIRK MRN: [id number] Date of Birth: 1935-01-14   Medicare Important Message Given:  Yes-third notification given    Orson Aloe 11/21/2014, 1:49 PM

## 2014-11-21 NOTE — Progress Notes (Signed)
Patient ID: [name] Frank, female   DOB: 04/11/1934, 80 y.o.   MRN: [id number]    Subjective: Pt feels well today.  No complaints.   Objective: Vital signs in last 24 hours: Temp:  [98.1 F (36.7 C)-98.3 F (36.8 C)] 98.2 F (36.8 C) (09/08 0544) Pulse Rate:  [82-98] 93 (09/08 0544) Resp:  [18-20] 20 (09/08 0544) BP: (126-145)/(57-74) 145/74 mmHg (09/08 0544) SpO2:  [95 %-100 %] 95 % (09/08 0544) Last BM Date: 11/20/14  Intake/Output from previous day: 09/07 0701 - 09/08 0700 In: 1083 [P.O.:1080; I.V.:3] Out: -  Intake/Output this shift:    PE: Abd: soft, NT, ND, +BS  Lab Results:  No results for input(s): WBC, HGB, HCT, PLT in the last 72 hours. BMET  Recent Labs  11/21/14 0445  CREATININE 0.69   PT/INR No results for input(s): LABPROT, INR in the last 72 hours. CMP     Component Value Date/Time   NA 137 11/17/2014 0700   K 4.3 11/17/2014 0700   CL 103 11/17/2014 0700   CO2 27 11/17/2014 0700   GLUCOSE 92 11/17/2014 0700   BUN 7 11/17/2014 0700   CREATININE 0.69 11/21/2014 0445   CALCIUM 8.4* 11/17/2014 0700   PROT 6.2* 11/14/2014 1903   ALBUMIN 2.8* 11/14/2014 1903   AST 22 11/14/2014 1903   ALT 10* 11/14/2014 1903   ALKPHOS 57 11/14/2014 1903   BILITOT 0.5 11/14/2014 1903   GFRNONAA >60 11/21/2014 0445   GFRAA >60 11/21/2014 0445   Lipase  No results found for: LIPASE     Studies/Results: No results found.  Anti-infectives: Anti-infectives    Start     Dose/Rate Route Frequency Ordered Stop   11/22/14 0600  cefOXitin (MEFOXIN) 1 g in dextrose 5 % 50 mL IVPB     1 g 100 mL/hr over 30 Minutes Intravenous On call to O.R. 11/21/14 0802 11/23/14 0559   11/14/14 2200  amoxicillin-clavulanate (AUGMENTIN) 875-125 MG per tablet 1 tablet  Status:  Discontinued     1 tablet Oral 2 times daily 11/14/14 1737 11/16/14 1746       Assessment/Plan  1. Stage 4 sacral decubitus ulcer likely with sacral osteomyelitis -will plan OR tomorrow for bone  biopsy and for lap loop colostomy  -NPO p MN -abx on call to OR -hold lovenox 11-22-14 for OR -husband and patient still very agreeable to this plan -they would really like patient to go to SNF after surgery.  Will need PT/OT eval post op. -cont BID dressing changes to wound   LOS: 7 days    [name] Frank E 11/21/2014, 8:02 AM Pager: [phone]

## 2014-11-21 NOTE — Consult Note (Signed)
Consultation Note Date: 11/21/2014   Patient Name: [name] Frank  DOB: 1935-01-25  MRN: [id number]  Age / Sex: 80 y.o., female   PCP: Kristian Covey, MD Referring Physician: Renae Fickle, MD  Reason for Consultation: Establishing goals of care and Psychosocial/spiritual support  Palliative Care Assessment and Plan Summary of Established Goals of Care and Medical Treatment Preferences    Palliative Care Discussion Held Today:    This NP Lorinda Creed reviewed medical records, received report from team, assessed the patient and then meet at the patient's bedside along with her husband, son/[name] Frank, SIL and daughter in law/[name] Frank  to discuss diagnosis prognosis, GOC, EOL wishes disposition and options.  A detailed discussion was had today regarding advanced directives.  Concepts specific to code status, artifical feeding and hydration, continued IV antibiotics and rehospitalization was had.  The difference between a aggressive medical intervention path  and a palliative comfort care path for this patient at this time was had.  Values and goals of care important to patient and family were attempted to be elicited.  Concept of Hospice and Palliative Care were discussed  Natural trajectory and expectations at EOL were discussed.  Questions and concerns addressed.  Hard Choices booklet left for review. Family encouraged to call with questions or concerns.  PMT will continue to support holistically.  MOST introduced  Primary Decision Maker: Husband, no documentation but family support his decsions   Goals of Care/Code Status/Advance Care Planning:   Code Status:  Full code  At this time family is open to all offered and available medical interventions to prolong life.  Surgery is planned for tomorrow, they express hope for improvement with ultimate goal to return home.  We discussed in detail the natural trajectory of overall failure to thrive and the likely difficulties this  treatment plan will present.   Psycho-social/Spiritual:   Support System: family  Desire for further Chaplaincy support:yes-will write for consult    Discharge Planning: Family anticipates need for SNF on discharge       Chief Complaint: Skin breakdown Stage IV acral decubitus  History of Present Illness:  WILLO YOON is a 80 y.o. female with a past medical history of stage IV decubitus ulcer, hypertension, dyslipidemia, cognitive impairment, who presents as a transfer from her primary care physician's office. She was receiving care at the wound care center at [facility] It appears cultures done at that facility on 10/30/2014 grew Enterococcus avium that was susceptible to ampicillin and was started on Augmentin therapy. Throughout this time she has denied having associated pain, fevers, chills, nausea, vomiting.  At baseline family members tell me she is minimally active and requires assistance with activities of daily living. They state that she spends the majority or day sitting in her recliner or laying in bed. On 11/06/2014 x-ray revealed evidence of destruction within the distal sacrum and coccyx suspected to be secondary to infection. Radiology had recommended a further workup with CT or MRI. Her primary care physician recommended hospitalization to undergo workup and further treatment.  Family faces treatment options, advanced directive decisions and anticipatory care needs    Primary Diagnoses  Present on Admission:  . Decubitus ulcer of sacral region, stage 4 . Protein-calorie malnutrition . Osteoarthritis . Essential hypertension . Stage IV decubitus ulcer  Palliative Review of Systems:    - denies pain or discomfort, reports weakness   I have reviewed the medical record, interviewed the patient and family, and examined the patient. The  following aspects are pertinent.  Past  Medical History  Diagnosis Date  . Allergy   . Hyperlipidemia   . Hypertension   . Anxiety   . GERD (gastroesophageal reflux disease)   . Urinary frequency   . Neurogenic urinary incontinence     since back surgery  . Arthritis     degenerative oateoarthritis  . Decubital ulcer 11/14/2014  . Dementia    Social History   Social History  . Marital Status: Married    Spouse Name: N/A  . Number of Children: N/A  . Years of Education: N/A   Social History Main Topics  . Smoking status: Former Smoker -- 0.50 packs/day for 10 years    Types: Cigarettes    Quit date: 09/23/1983  . Smokeless tobacco: Never Used  . Alcohol Use: No  . Drug Use: No  . Sexual Activity: Not Asked   Other Topics Concern  . None   Social History Narrative   Family History  Problem Relation Age of Onset  . Diabetes Mother   . Alcohol abuse      fhx  . Arthritis      fhx  . Hypertension      fhx  . Heart disease      fhx  . Cancer      colon, lung, prostate/fhx  . Stroke      fhx  . Depression      fhx   Scheduled Meds: . [START ON 11/22/2014] cefOXitin  1 g Intravenous To SSTC  . donepezil  10 mg Oral QHS  . enoxaparin (LOVENOX) injection  40 mg Subcutaneous Q24H  . feeding supplement  1 Container Oral TID BM  . losartan  50 mg Oral Daily  . multivitamin with minerals  1 tablet Oral Daily  . saccharomyces boulardii  250 mg Oral BID  . sodium chloride  3 mL Intravenous Q12H  . vitamin C  500 mg Oral BID  . zinc sulfate  220 mg Oral Daily   Continuous Infusions:  PRN Meds:.sodium chloride, acetaminophen **OR** acetaminophen, alum & mag hydroxide-simeth, diphenhydrAMINE, morphine injection, ondansetron **OR** ondansetron (ZOFRAN) IV, oxyCODONE-acetaminophen, sodium chloride Medications Prior to Admission:  Prior to Admission medications   Medication Sig Start Date End Date Taking? Authorizing Provider  amLODipine (NORVASC) 5 MG tablet TAKE 1 TABLET BY MOUTH EVERY DAY 06/27/14  Yes  Kristian Covey, MD  amoxicillin-clavulanate (AUGMENTIN) 875-125 MG per tablet Take 1 tablet by mouth 2 (two) times daily. 11/07/14  Yes Historical Provider, MD  donepezil (ARICEPT) 10 MG tablet Take 1 tablet (10 mg total) by mouth at bedtime. 07/19/14  Yes Kristian Covey, MD  furosemide (LASIX) 20 MG tablet TAKE 1 TABLET (20 MG TOTAL) BY MOUTH DAILY. 10/02/14  Yes Kristian Covey, MD  losartan (COZAAR) 50 MG tablet TAKE 1 TABLET BY MOUTH DAILY 01/14/14  Yes Kristian Covey, MD  megestrol (MEGACE ORAL) 40 MG/ML suspension One tsp po bid 06/19/14  Yes Kristian Covey, MD  oxyCODONE-acetaminophen (PERCOCET/ROXICET) 5-325 MG per tablet Take 1 tablet by mouth every 6 (six) hours as needed for severe pain. 11/08/14  Yes Kristian Covey, MD  vitamin B-12 (CYANOCOBALAMIN) 500 MCG tablet Take 500 mcg by mouth daily.   Yes Historical Provider, MD   Allergies  Allergen Reactions  . Codeine Sulfate Other (See Comments)    stomach pains  . Tramadol Hcl Other (See Comments)    stomach cramps   CBC:    Component Value  Date/Time   WBC 14.5* 11/17/2014 0700   HGB 11.5* 11/17/2014 0700   HCT 35.4* 11/17/2014 0700   PLT 364 11/17/2014 0700   MCV 83.9 11/17/2014 0700   NEUTROABS 9.7* 07/03/2014 1713   LYMPHSABS 2.0 07/03/2014 1713   MONOABS 0.2 07/03/2014 1713   EOSABS 0.0 07/03/2014 1713   BASOSABS 0.1 07/03/2014 1713   Comprehensive Metabolic Panel:    Component Value Date/Time   NA 137 11/17/2014 0700   K 4.3 11/17/2014 0700   CL 103 11/17/2014 0700   CO2 27 11/17/2014 0700   BUN 7 11/17/2014 0700   CREATININE 0.69 11/21/2014 0445   GLUCOSE 92 11/17/2014 0700   CALCIUM 8.4* 11/17/2014 0700   AST 22 11/14/2014 1903   ALT 10* 11/14/2014 1903   ALKPHOS 57 11/14/2014 1903   BILITOT 0.5 11/14/2014 1903   PROT 6.2* 11/14/2014 1903   ALBUMIN 2.8* 11/14/2014 1903    Physical Exam:  Vital Signs: BP 145/74 mmHg  Pulse 93  Temp(Src) 98.2 F (36.8 C) (Oral)  Resp 20  Ht 5\' 7"  (1.702  m)  Wt 51.71 kg (114 lb)  BMI 17.85 kg/m2  SpO2 95% SpO2: SpO2: 95 % O2 Device: O2 Device: Not Delivered O2 Flow Rate:   Intake/output summary:  Intake/Output Summary (Last 24 hours) at 11/21/14 1305 Last data filed at 11/21/14 0600  Gross per 24 hour  Intake    603 ml  Output      0 ml  Net    603 ml   LBM: Last BM Date: 11/20/14 Baseline Weight: Weight: 51.71 kg (114 lb) Most recent weight: Weight: 51.71 kg (114 lb)  Exam Findings:   General: pleasantly confused, chronically ill appearing HEENT: moist buccal membranes CVS: RRR Resp: decreased in bases Abd: soft NT +BS Extrem: without edema Skin: Sacral decubitus ulcer which measures about 5 x 6 at the opening and has nearly circumferential undermining of at least 3-4 cm and tracking medially to the bone about 5-7 cm. There is yellow slough present and pale pink wound bed without clear granulation. There is no foul odor. She has no pain in the area and minimal sensation.    Neuro: She is alert, oriented person and place only   Sensory changes in pelvis and lower extremities noted from EMR.            Palliative Performance Scale:  30%                Additional Data Reviewed: Recent Labs     11/21/14  0445  CREATININE  0.69     Time In: 1230 Time Out: 1400 Time Total: 90 min  Greater than 50%  of this time was spent counseling and coordinating care related to the above assessment and plan.  Discussed with  Dr Malachi Bonds  Signed by: Lorinda Creed, NP  Canary Brim, NP  11/21/2014, 1:05 PM  Please contact Palliative Medicine Team phone at [phone] for questions and concerns.   See AMION for contact information

## 2014-11-22 ENCOUNTER — Inpatient Hospital Stay (HOSPITAL_COMMUNITY): Payer: Medicare Other | Admitting: Anesthesiology

## 2014-11-22 ENCOUNTER — Encounter (HOSPITAL_COMMUNITY): Payer: Self-pay | Admitting: Anesthesiology

## 2014-11-22 ENCOUNTER — Encounter (HOSPITAL_COMMUNITY)
Admission: AD | Disposition: A | Discharge status: Skilled Nursing Facility | Payer: Self-pay | Source: Ambulatory Visit | Attending: Internal Medicine

## 2014-11-22 DIAGNOSIS — M1990 Unspecified osteoarthritis, unspecified site: Secondary | ICD-10-CM | POA: Diagnosis not present

## 2014-11-22 DIAGNOSIS — K219 Gastro-esophageal reflux disease without esophagitis: Secondary | ICD-10-CM | POA: Diagnosis not present

## 2014-11-22 DIAGNOSIS — Z515 Encounter for palliative care: Secondary | ICD-10-CM

## 2014-11-22 DIAGNOSIS — Z7189 Other specified counseling: Secondary | ICD-10-CM

## 2014-11-22 DIAGNOSIS — L89159 Pressure ulcer of sacral region, unspecified stage: Secondary | ICD-10-CM | POA: Diagnosis not present

## 2014-11-22 HISTORY — PX: ILEO LOOP COLOSTOMY CLOSURE: SHX5257

## 2014-11-22 HISTORY — PX: BONE MARROW BIOPSY: SHX1253

## 2014-11-22 SURGERY — BIOPSY, BONE MARROW
Anesthesia: General | Site: Buttocks

## 2014-11-22 MED ORDER — ACETAMINOPHEN 10 MG/ML IV SOLN: 1000.0000 mg | Freq: Once | INTRAVENOUS | Status: DC | PRN

## 2014-11-22 MED ORDER — MEPERIDINE HCL 25 MG/ML IJ SOLN: 6.2500 mg | INTRAMUSCULAR | Status: DC | PRN

## 2014-11-22 MED ORDER — PROPOFOL 10 MG/ML IV BOLUS
INTRAVENOUS | Status: DC | PRN
  Administered 2014-11-22: 120 mg via INTRAVENOUS

## 2014-11-22 MED ORDER — LACTATED RINGERS IV SOLN
INTRAVENOUS | Status: DC
  Administered 2014-11-22 – 2014-11-25 (×3): via INTRAVENOUS

## 2014-11-22 MED ORDER — ONDANSETRON HCL 4 MG/2ML IJ SOLN
INTRAMUSCULAR | Status: DC | PRN
  Administered 2014-11-22: 4 mg via INTRAVENOUS

## 2014-11-22 MED ORDER — ROCURONIUM BROMIDE 100 MG/10ML IV SOLN
INTRAVENOUS | Status: DC | PRN
  Administered 2014-11-22: 40 mg via INTRAVENOUS
  Administered 2014-11-22: 30 mg via INTRAVENOUS

## 2014-11-22 MED ORDER — NEOSTIGMINE METHYLSULFATE 10 MG/10ML IV SOLN
INTRAVENOUS | Status: DC | PRN
  Administered 2014-11-22: 3 mg via INTRAVENOUS

## 2014-11-22 MED ORDER — HYDROMORPHONE HCL 1 MG/ML IJ SOLN
0.2500 mg | INTRAMUSCULAR | Status: DC | PRN
  Administered 2014-11-22: 0.25 mg via INTRAVENOUS

## 2014-11-22 MED ORDER — CEFUROXIME SODIUM 1.5 G IJ SOLR
1.5000 g | INTRAMUSCULAR | Status: DC | PRN
  Administered 2014-11-22: 1.5 g via INTRAVENOUS

## 2014-11-22 MED ORDER — SODIUM CHLORIDE 0.9 % IR SOLN
Status: DC | PRN
  Administered 2014-11-22: 2000 mL

## 2014-11-22 MED ORDER — BUPIVACAINE-EPINEPHRINE (PF) 0.25% -1:200000 IJ SOLN
INTRAMUSCULAR | Status: AC
  Filled 2014-11-22: qty 30

## 2014-11-22 MED ORDER — FENTANYL CITRATE (PF) 100 MCG/2ML IJ SOLN
INTRAMUSCULAR | Status: DC | PRN
  Administered 2014-11-22 (×2): 50 ug via INTRAVENOUS

## 2014-11-22 MED ORDER — GLYCOPYRROLATE 0.2 MG/ML IJ SOLN
INTRAMUSCULAR | Status: DC | PRN
  Administered 2014-11-22: 0.4 mg via INTRAVENOUS

## 2014-11-22 MED ORDER — FENTANYL CITRATE (PF) 100 MCG/2ML IJ SOLN: 12.5000 ug | INTRAMUSCULAR | Status: DC | PRN

## 2014-11-22 MED ORDER — DEXTROSE 5 % IV SOLN
INTRAVENOUS | Status: AC
  Filled 2014-11-22: qty 1.5

## 2014-11-22 MED ORDER — LACTATED RINGERS IV SOLN
INTRAVENOUS | Status: DC | PRN
  Administered 2014-11-22 (×2): via INTRAVENOUS

## 2014-11-22 MED ORDER — HYDROMORPHONE HCL 1 MG/ML IJ SOLN
INTRAMUSCULAR | Status: AC
  Administered 2014-11-22: 0.25 mg via INTRAVENOUS
  Filled 2014-11-22: qty 1

## 2014-11-22 MED ORDER — PROMETHAZINE HCL 25 MG/ML IJ SOLN: 6.2500 mg | INTRAMUSCULAR | Status: DC | PRN

## 2014-11-22 MED ORDER — PHENYLEPHRINE HCL 10 MG/ML IJ SOLN
INTRAMUSCULAR | Status: DC | PRN
  Administered 2014-11-22: 80 ug via INTRAVENOUS

## 2014-11-22 MED ORDER — BUPIVACAINE-EPINEPHRINE 0.25% -1:200000 IJ SOLN
INTRAMUSCULAR | Status: DC | PRN
  Administered 2014-11-22: 10 mL

## 2014-11-22 SURGICAL SUPPLY — 59 items
BLADE SURG ROTATE 9660 (MISCELLANEOUS) IMPLANT
CANISTER SUCTION 2500CC (MISCELLANEOUS) ×3 IMPLANT
CHLORAPREP W/TINT 26ML (MISCELLANEOUS) ×3 IMPLANT
CONT SPEC STER OR (MISCELLANEOUS) ×9 IMPLANT
COVER MAYO STAND STRL (DRAPES) ×3 IMPLANT
COVER SURGICAL LIGHT HANDLE (MISCELLANEOUS) ×3 IMPLANT
DRAPE LAPAROSCOPIC ABDOMINAL (DRAPES) ×3 IMPLANT
DRAPE PROXIMA HALF (DRAPES) ×3 IMPLANT
DRAPE UTILITY XL STRL (DRAPES) ×6 IMPLANT
DRAPE WARM FLUID 44X44 (DRAPE) ×3 IMPLANT
ELECT BLADE 6.5 EXT (BLADE) ×3 IMPLANT
ELECT CAUTERY BLADE 6.4 (BLADE) ×3 IMPLANT
ELECT REM PT RETURN 9FT ADLT (ELECTROSURGICAL) ×3
ELECTRODE REM PT RTRN 9FT ADLT (ELECTROSURGICAL) ×2 IMPLANT
GLOVE BIO SURGEON STRL SZ8 (GLOVE) ×6 IMPLANT
GLOVE BIOGEL PI IND STRL 7.0 (GLOVE) ×6 IMPLANT
GLOVE BIOGEL PI IND STRL 8 (GLOVE) ×4 IMPLANT
GLOVE BIOGEL PI INDICATOR 7.0 (GLOVE) ×3
GLOVE BIOGEL PI INDICATOR 8 (GLOVE) ×2
GLOVE SURG SS PI 7.0 STRL IVOR (GLOVE) ×9 IMPLANT
GOWN STRL REUS W/ TWL LRG LVL3 (GOWN DISPOSABLE) ×10 IMPLANT
GOWN STRL REUS W/ TWL XL LVL3 (GOWN DISPOSABLE) ×2 IMPLANT
GOWN STRL REUS W/TWL LRG LVL3 (GOWN DISPOSABLE) ×5
GOWN STRL REUS W/TWL XL LVL3 (GOWN DISPOSABLE) ×2
KIT BASIN OR (CUSTOM PROCEDURE TRAY) ×3 IMPLANT
KIT COLOSTOMY ILEOSTOMY 4 (WOUND CARE) ×3 IMPLANT
KIT ROOM TURNOVER OR (KITS) ×3 IMPLANT
LIGASURE IMPACT 36 18CM CVD LR (INSTRUMENTS) IMPLANT
LIQUID BAND (GAUZE/BANDAGES/DRESSINGS) ×3 IMPLANT
NEEDLE 22X1 1/2 (OR ONLY) (NEEDLE) ×3 IMPLANT
NS IRRIG 1000ML POUR BTL (IV SOLUTION) ×6 IMPLANT
OSTOMY BRIDGE 2 1/2 (MISCELLANEOUS) ×3 IMPLANT
PAD ABD 8X10 STRL (GAUZE/BANDAGES/DRESSINGS) ×3 IMPLANT
PAD ARMBOARD 7.5X6 YLW CONV (MISCELLANEOUS) ×3 IMPLANT
PENCIL BUTTON HOLSTER BLD 10FT (ELECTRODE) ×3 IMPLANT
SET IRRIG TUBING LAPAROSCOPIC (IRRIGATION / IRRIGATOR) IMPLANT
SPECIMEN JAR MEDIUM (MISCELLANEOUS) IMPLANT
SPONGE GAUZE 4X4 12PLY STER LF (GAUZE/BANDAGES/DRESSINGS) ×3 IMPLANT
SPONGE LAP 18X18 X RAY DECT (DISPOSABLE) IMPLANT
STAPLER VISISTAT 35W (STAPLE) ×3 IMPLANT
SUCTION POOLE TIP (SUCTIONS) ×3 IMPLANT
SUT PDS AB 1 TP1 96 (SUTURE) IMPLANT
SUT SILK 2 0 FS (SUTURE) ×3 IMPLANT
SUT SILK 2 0 SH CR/8 (SUTURE) ×3 IMPLANT
SUT SILK 2 0 TIES 10X30 (SUTURE) ×3 IMPLANT
SUT SILK 3 0 SH CR/8 (SUTURE) ×3 IMPLANT
SUT SILK 3 0 TIES 10X30 (SUTURE) ×3 IMPLANT
SUT VIC AB 3-0 SH 18 (SUTURE) ×3 IMPLANT
SUT VICRYL 4-0 PS2 18IN ABS (SUTURE) ×3 IMPLANT
TAPE CLOTH SURG 6X10 WHT LF (GAUZE/BANDAGES/DRESSINGS) ×3 IMPLANT
TOWEL OR 17X26 10 PK STRL BLUE (TOWEL DISPOSABLE) ×6 IMPLANT
TRAY FOLEY CATH 14FRSI W/METER (CATHETERS) ×3 IMPLANT
TRAY LAPAROSCOPIC MC (CUSTOM PROCEDURE TRAY) ×3 IMPLANT
TROCAR BLADELESS 12MM (ENDOMECHANICALS) ×3 IMPLANT
TROCAR BLADELESS 5MM (ENDOMECHANICALS) ×3 IMPLANT
TUBE CONNECTING 12X1/4 (SUCTIONS) ×6 IMPLANT
TUBING INSUFFLATION (TUBING) ×3 IMPLANT
WATER STERILE IRR 1000ML POUR (IV SOLUTION) IMPLANT
YANKAUER SUCT BULB TIP NO VENT (SUCTIONS) ×6 IMPLANT

## 2014-11-22 NOTE — Progress Notes (Signed)
Patient ID: [name] Frank, female   DOB: [birth year], 80 y.o.   MRN: [id number] Day of Surgery  Subjective: Pt comfortable.  No questions from the family  Objective: Vital signs in last 24 hours: Temp:  [98.7 F (37.1 C)-99.1 F (37.3 C)] 99.1 F (37.3 C) (09/09 4098) Pulse Rate:  [80-89] 89 (09/09 0633) Resp:  [18] 18 (09/09 0633) BP: (120-140)/(72-75) 140/75 mmHg (09/09 0633) SpO2:  [94 %-100 %] 98 % (09/09 0633) Last BM Date: 11/21/14  Intake/Output from previous day: 09/08 0701 - 09/09 0700 In: 240 [P.O.:240] Out: -  Intake/Output this shift:    PE: Abd: soft, NT, ND, +BS  Lab Results:  No results for input(s): WBC, HGB, HCT, PLT in the last 72 hours. BMET  Recent Labs  11/21/14 0445  CREATININE 0.69   PT/INR No results for input(s): LABPROT, INR in the last 72 hours. CMP     Component Value Date/Time   NA 137 11/17/2014 0700   K 4.3 11/17/2014 0700   CL 103 11/17/2014 0700   CO2 27 11/17/2014 0700   GLUCOSE 92 11/17/2014 0700   BUN 7 11/17/2014 0700   CREATININE 0.69 11/21/2014 0445   CALCIUM 8.4* 11/17/2014 0700   PROT 6.2* 11/14/2014 1903   ALBUMIN 2.8* 11/14/2014 1903   AST 22 11/14/2014 1903   ALT 10* 11/14/2014 1903   ALKPHOS 57 11/14/2014 1903   BILITOT 0.5 11/14/2014 1903   GFRNONAA >60 11/21/2014 0445   GFRAA >60 11/21/2014 0445   Lipase  No results found for: LIPASE     Studies/Results: No results found.  Anti-infectives: Anti-infectives    Start     Dose/Rate Route Frequency Ordered Stop   11/22/14 0600  cefOXitin (MEFOXIN) 1 g in dextrose 5 % 50 mL IVPB     1 g 100 mL/hr over 30 Minutes Intravenous To Short Stay 11/21/14 0802 11/23/14 0600   11/14/14 2200  amoxicillin-clavulanate (AUGMENTIN) 875-125 MG per tablet 1 tablet  Status:  Discontinued     1 tablet Oral 2 times daily 11/14/14 1737 11/16/14 1746       Assessment/Plan  1. Stage 4 sacral decubitus ulcer with possible osteo -plan for OR today for sacral bone bx  and lap loop colostomy -all questions answered -WOC is up here now to mark patient for colostomy.   LOS: 8 days    Zaide Kardell E 11/22/2014, 8:03 AM Pager: [phone]

## 2014-11-22 NOTE — Op Note (Signed)
11/14/2014 - 11/22/2014  3:23 PM  PATIENT:  [name] Frank  80 y.o. female  PRE-OPERATIVE DIAGNOSIS:  SACRAL DECUBITUS ULCER  POST-OPERATIVE DIAGNOSIS:  SACRAL DECUBITUS ULCER  PROCEDURE:  Procedure(s): SACRAL BONE BIOPSY PRONE POSITION DEBRIDEMENT SACRAL DECUBITUS ULCER LAPAROSCOPIC LOOP COLOSTOMY   SURGEON:  Surgeon(s): Violeta Gelinas, MD  ASSISTANTS: none   ANESTHESIA:   local and general  EBL:  Total I/O In: 1000 [I.V.:1000] Out: 40 [Urine:40]  BLOOD ADMINISTERED:none  DRAINS: none   SPECIMEN:  Excision  DISPOSITION OF SPECIMEN:  PATHOLOGY  COUNTS:  YES  DICTATION: .Dragon Dictation  [name] is brought for sacral bone biopsy and laparoscopic loop colostomy. She was identified in preop holding area. Informed consent was obtained. She was brought to the operating room. She was placed under general endotracheal anesthesia. She was placed in prone position. Timeout procedure was performed. Sacral wound was prepped and draped in sterile fashion. The wound was debrided with cautery removing all devitalized tissue. There was some undermining. Measurements are listed below. The sacrum was exposed. [name] were used to remove the periosteum and bone biopsy was taken both for culture and pathology. Cautery was used to get good hemostasis after the bone biopsy and and the remainder of the wound. A sterile dressing was applied. She was then turned over supine. At this point she received intravenous antibiotics. Abdomen was prepped and draped in sterile fashion after the nurses placed a Foley catheter.The infraumbilical region was infiltrated with local. Infraumbilical incision was made. Subcutaneous tissues were dissected down revealing the anterior fascia. This was divided sharply along the midline. Peritoneal cavity was entered under direct vision without complication. A 0 Vicryl pursestring was placed around the fascial opening. Hassan trocar was inserted into the abdomen. The abdomen  was insufflated with carbon dioxide in standard fashion. There were several omental adhesions along the midline which were gently and gradually swept down bluntly, mostly using finger technique. This freed up the intra-abdominal region safely and wasre positioned. A 5 mm port was placed in the left lower quadrant at the ostomy mark site. The sigmoid colon was freed up from some lateral attachments using the Harmonic. It then reached up nicely to the planned ostomy site. The colon was held with a grasper. A circular incision was made in the skin. Subcutaneous tissues were dissected down and the fascia was opened to allow the colon to exit. The loop of colon was brought out through the skin in the insufflation was released. A careful hole was made through the mesentery underneath the loop of the sigmoid colon. Cautery was used. A ostomy bar was placed underneath and sutured to the skin. Loop colostomy was matured with 3-0 Vicryl. Hemostasis was ensured. I removed my outer gloves. The infraumbilical wound was carefully inspected. There was no significant bleeding or evidence of other complications from the adhesion lysis. Fascia was closed with tying the pursestring and skin was closed with running 4-0 Vicryl followed by liquid barium. All counts were correct. She tolerated procedure well without apparent complication was taken recovery in stable condition. Ostomy appliance was applied.  1.  Progress note or procedure note with a detailed description of the procedure: see above  2.  Tool used for debridement (curette, scapel, etc.)  cautery  3.  Frequency of surgical debridement.   once  4.  Measurement of total devitalized tissue (wound surface) before and after surgical debridement.   Wound surface had scattered devitalized tissue  5.  Area and depth of devitalized tissue  removed from wound.  7 cm long by 4 cm wide by 1 cm deep prior to debridement, undermining 1 cm bilaterally. After debridement 7 cm long  by 4 cm wide by 2 cm deep.  6.  Blood loss and description of tissue removed.  Fibrinous tissue, blood loss minimal  7.  Evidence of the progress of the wound's response to treatment.  A.  Current wound volume (current dimensions and depth).  7 cm x 4 cm x 2 cm as above  B.  Presence (and extent of) of infection.  Present  C.  Presence (and extent of) of non viable tissue.  See above  D.  Other material in the wound that is expected to inhibit healing.  None  8.  Was there any viable tissue removed (measurements): No  PATIENT DISPOSITION:  PACU - hemodynamically stable.   Delay start of Pharmacological VTE agent (>24hrs) due to surgical blood loss or risk of bleeding:  no  Violeta Gelinas, MD, MPH, FACS Pager: [phone]  9/9/20163:23 PM

## 2014-11-22 NOTE — Progress Notes (Signed)
   11/22/14 1600  Clinical Encounter Type  Visited With Family  Visit Type Initial;Spiritual support;Other (Comment)  Referral From Nurse;Palliative care team (Consult )  Chaplain went to Pt room and found only family; Pt was out for surgery the family statement or for further testing

## 2014-11-22 NOTE — Progress Notes (Signed)
[facility] PROGRESS NOTE  [name] Frank ZOX:[id number] DOB: 04-Dec-1934 DOA: 11/14/2014 PCP: Kristian Covey, MD  Brief Summary  Patient is a 80 year old African-American female with history of dementia and limited mobility spending much of her time in chair with history of reduced sensation in pelvic area due to prior back surgery. Subsequently developed stage IV decubitus ulcer. Cultures done at [facility] on 10/30/2014 grew enterococcus species which was susceptible to ampicillin and patient was started on Augmentin therapy. Was transferred here at the request of her primary care physician for further evaluation of sacral decubitus ulcer  Assessment/Plan  Decubitus ulcer of sacral region, stage 4 with osteomyelitis -  Appreciate ID, plastic surgery, and general surgery assistance -  abx discontinued per ID in anticipation of biopsy  -  Plan for biopsy with Acell placement and diverting colostomy today -  Consideration of period of foley catheter placement while on antibiotics for her osteomyelitis to also help with wound healing  1-2 loose stools per day chronically, not typical of C. Diff diarrhea -  Stools occuring frequently  Essential hypertension, stable - continue losartan  Osteoarthritis - stable  Severe protein-calorie malnutrition - continue nutritional supplementation per nutrition recommendations - continue regular diet  Petechial rash - resolved after discontinuing antibiotic and amlodipine. Etiology uncertain  Diet:  regular Access:  PIV IVF:  off Proph:  lovenox  Code Status: full Family Communication: patient and family at bedside Disposition Plan: surgery on Friday, anticipate eventual discharge to SNF  Consultants:  ID Dr. Ninetta Lights  Plastic surgery, Dr. Kelly Splinter  General Surgery, Dr. Janee Morn  Procedures:  CT abdomen and pelvis 9/2  MR pelvis 9/2  Antibiotics:  Augmentin 9/1 > 9/3  HPI/Subjective:  Feels well,  denies SOB, cough, nausea, vomiting.      Objective: Filed Vitals:   11/21/14 0544 11/21/14 1305 11/21/14 2141 11/22/14 0633  BP: 145/74 120/73 131/72 140/75  Pulse: 93 88 80 89  Temp: 98.2 F (36.8 C) 98.7 F (37.1 C) 98.9 F (37.2 C) 99.1 F (37.3 C)  TempSrc: Oral Axillary Oral Oral  Resp: Height:      Weight:      SpO2: 95% 100% 94% 98%    Intake/Output Summary (Last 24 hours) at 11/22/14 1456 Last data filed at 11/22/14 1453  Gross per 24 hour  Intake   1120 ml  Output     40 ml  Net   1080 ml   Filed Weights   11/16/14 2045  Weight: 51.71 kg (114 lb)   Body mass index is 17.85 kg/(m^2).  Exam:   General:  Cachectic female, No acute distress, lying in bed  HEENT:  NCAT, MMM  Cardiovascular:  RRR, nl S1, S2 no mrg, 2+ pulses, warm extremities  Respiratory:  CTAB anteriorly, no increased WOB  Abdomen:   NABS, soft, NT/ND  MSK:   Decreased tone and bulk, no LEE  Data Reviewed: Basic Metabolic Panel:  Recent Labs Lab 11/16/14 1315 11/17/14 0700 11/21/14 0445  NA  --  137  --   K 3.6 4.3  --   CL  --  103  --   CO2  --  27  --   GLUCOSE  --  92  --   BUN  --  7  --   CREATININE  --  0.64 0.69  CALCIUM  --  8.4*  --    Liver Function Tests: No results for input(s): AST, ALT, ALKPHOS, BILITOT, PROT,  ALBUMIN in the last 168 hours. No results for input(s): LIPASE, AMYLASE in the last 168 hours. No results for input(s): AMMONIA in the last 168 hours. CBC:  Recent Labs Lab 11/16/14 0254 11/17/14 0700  WBC 13.6* 14.5*  HGB 11.1* 11.5*  HCT 33.1* 35.4*  MCV 83.8 83.9  PLT 356 364    Recent Results (from the past 240 hour(s))  Culture, blood (routine x 2)     Status: None   Collection Time: 11/14/14  6:59 PM  Result Value Ref Range Status   Specimen Description BLOOD RIGHT HAND  Final   Special Requests IN PEDIATRIC BOTTLE 1CC  Final   Culture NO GROWTH 5 DAYS  Final   Report Status 11/19/2014 FINAL  Final  Culture, blood  (routine x 2)     Status: None   Collection Time: 11/14/14  7:03 PM  Result Value Ref Range Status   Specimen Description BLOOD LEFT HAND  Final   Special Requests BOTTLES DRAWN AEROBIC AND ANAEROBIC 5CC  Final   Culture NO GROWTH 5 DAYS  Final   Report Status 11/19/2014 FINAL  Final  Surgical pcr screen     Status: None   Collection Time: 11/21/14  5:11 AM  Result Value Ref Range Status   MRSA, PCR NEGATIVE NEGATIVE Final   Staphylococcus aureus NEGATIVE NEGATIVE Final    Comment:        The Xpert SA Assay (FDA approved for NASAL specimens in patients over 6 years of age), is one component of a comprehensive surveillance program.  Test performance has been validated by [facility] for patients greater than or equal to 83 year old. It is not intended to diagnose infection nor to guide or monitor treatment.      Studies: No results found.  Scheduled Meds: . cefOXitin  1 g Intravenous To SSTC  . cefUROXime (ZINACEF) 1.5 GM IVPB      . [MAR Hold] donepezil  10 mg Oral QHS  . [MAR Hold] enoxaparin (LOVENOX) injection  40 mg Subcutaneous Q24H  . [MAR Hold] feeding supplement  1 Container Oral TID BM  . [MAR Hold] losartan  50 mg Oral Daily  . [MAR Hold] multivitamin with minerals  1 tablet Oral Daily  . [MAR Hold] saccharomyces boulardii  250 mg Oral BID  . [MAR Hold] sodium chloride  3 mL Intravenous Q12H  . [MAR Hold] vitamin C  500 mg Oral BID  . [MAR Hold] zinc sulfate  220 mg Oral Daily   Continuous Infusions: . lactated ringers 50 mL/hr at 11/22/14 1339    Principal Problem:   Decubitus ulcer of sacral region, stage 4 Active Problems:   Essential hypertension   Osteoarthritis   Protein-calorie malnutrition   Stage IV decubitus ulcer   Protein-calorie malnutrition, severe   Osteomyelitis, pelvic region and thigh   Goals of care, counseling/discussion   Enterococcus faecalis infection   Palliative care encounter   DNR (do not resuscitate)  discussion    Time spent: 30 min    Hellen Shanley, [facility]  [facility] Pager [phone]. If 7PM-7AM, please contact night-coverage at [email or web address], password [facility] 11/22/2014, 2:56 PM  LOS: 8 days

## 2014-11-22 NOTE — Progress Notes (Signed)
Day of Surgery  Subjective: No complaint  Objective: Vital signs in last 24 hours: Temp:  [98.7 F (37.1 C)-99.1 F (37.3 C)] 99.1 F (37.3 C) (09/09 1610) Pulse Rate:  [80-89] 89 (09/09 0633) Resp:  [18] 18 (09/09 0633) BP: (120-140)/(72-75) 140/75 mmHg (09/09 0633) SpO2:  [94 %-100 %] 98 % (09/09 9604) Last BM Date: 11/21/14  Intake/Output from previous day: 09/08 0701 - 09/09 0700 In: 240 [P.O.:240] Out: -  Intake/Output this shift:    General appearance: cooperative GI: soft, NT, ostomy site marked  Lab Results:  No results for input(s): WBC, HGB, HCT, PLT in the last 72 hours. BMET  Recent Labs  11/21/14 0445  CREATININE 0.69   PT/INR No results for input(s): LABPROT, INR in the last 72 hours. ABG No results for input(s): PHART, HCO3 in the last 72 hours.  Invalid input(s): PCO2, PO2  Studies/Results: No results found.  Anti-infectives: Anti-infectives    Start     Dose/Rate Route Frequency Ordered Stop   11/22/14 0600  cefOXitin (MEFOXIN) 1 g in dextrose 5 % 50 mL IVPB     1 g 100 mL/hr over 30 Minutes Intravenous To Short Stay 11/21/14 0802 11/23/14 0600   11/14/14 2200  amoxicillin-clavulanate (AUGMENTIN) 875-125 MG per tablet 1 tablet  Status:  Discontinued     1 tablet Oral 2 times daily 11/14/14 1737 11/16/14 1746      Assessment/Plan: For sacral bone biopsy and laparoscopic loop colostomy - procedure, risks, and benefits have been D/W family and they agree. Will not start ABX for surgical prophylaxis until after bone biopsy.  LOS: 8 days    [place],Kaylean Tupou E 11/22/2014

## 2014-11-22 NOTE — Anesthesia Preprocedure Evaluation (Signed)
Anesthesia Evaluation  Patient identified by MRN, date of birth, ID band Patient awake    Reviewed: Allergy & Precautions, H&P , NPO status , Patient's Chart, lab work & pertinent test results, reviewed documented beta blocker date and time   Airway Mallampati: II  TM Distance: >3 FB Neck ROM: full    Dental   Pulmonary former smoker,    breath sounds clear to auscultation       Cardiovascular hypertension, On Medications and On Home Beta Blockers  Rhythm:regular     Neuro/Psych PSYCHIATRIC DISORDERS Anxiety  Neuromuscular disease    GI/Hepatic Neg liver ROS, GERD  ,  Endo/Other  negative endocrine ROS  Renal/GU negative Renal ROS  negative genitourinary   Musculoskeletal  (+) Arthritis ,   Abdominal   Peds  Hematology negative hematology ROS (+) anemia ,   Anesthesia Other Findings See surgeon's H&P   Reproductive/Obstetrics negative OB ROS                             Anesthesia Physical  Anesthesia Plan  ASA: III  Anesthesia Plan: General   Post-op Pain Management:    Induction: Intravenous  Airway Management Planned: Oral ETT  Additional Equipment:   Intra-op Plan:   Post-operative Plan: Extubation in OR  Informed Consent: I have reviewed the patients History and Physical, chart, labs and discussed the procedure including the risks, benefits and alternatives for the proposed anesthesia with the patient or authorized representative who has indicated his/her understanding and acceptance.   Dental advisory given  Plan Discussed with: CRNA  Anesthesia Plan Comments:         Anesthesia Quick Evaluation

## 2014-11-22 NOTE — Transfer of Care (Signed)
Immediate Anesthesia Transfer of Care Note  Patient: [name] Frank  Procedure(s) Performed: Procedure(s): SACRAL BONE BIOPSY PRONE POSITION (N/A) LAPAROSCOPIC LOOP COLOSTOMY  (N/A)  Patient Location: PACU  Anesthesia Type:General  Level of Consciousness: awake  Airway & Oxygen Therapy: Patient Spontanous Breathing  Post-op Assessment: Report given to RN and Post -op Vital signs reviewed and stable  Post vital signs: Reviewed and stable  Last Vitals:  Filed Vitals:   11/22/14 0633  BP: 140/75  Pulse: 89  Temp: 37.3 C  Resp: 18    Complications: No apparent anesthesia complications

## 2014-11-22 NOTE — Progress Notes (Signed)
Report called to staff nurse in short stay holding/preop area. Pt to OR at this time.

## 2014-11-22 NOTE — Anesthesia Postprocedure Evaluation (Signed)
  Anesthesia Post-op Note  Patient: [name] Frank  Procedure(s) Performed: Procedure(s): SACRAL BONE BIOPSY PRONE POSITION (N/A) LAPAROSCOPIC LOOP COLOSTOMY  (N/A)  Patient Location: PACU  Anesthesia Type: General   Level of Consciousness: awake, alert  and oriented  Airway and Oxygen Therapy: Patient Spontanous Breathing  Post-op Pain: none  Post-op Assessment: Post-op Vital signs reviewed  Post-op Vital Signs: Reviewed  Last Vitals:  Filed Vitals:   11/22/14 1645  BP: 166/93  Pulse: 100  Temp: 36.5 C  Resp: 16    Complications: No apparent anesthesia complications

## 2014-11-22 NOTE — Consult Note (Signed)
WOC requested for preoperative stoma site marking  Discussed surgical procedure and stoma creation with patient and family.  Explained role of the WOC nurse team.  Provided the patient with educational booklet/DVD and provided samples of pouching options.  Answered patient and family questions.   Examined patient lying, sitting, in order to place the marking in the patient's visual field, away from any creases or abdominal contour issues and within the rectus muscle.  Attempted to mark below the patient's belt line.    Marked for colostomy in the LLQ 3 cm to the left of the umbilicus and __3_cm below the umbilicus.  WOC team will follow up with patient after surgery for continue ostomy care and teaching.  Myrtie Leuthold [place] RN,CWOCN [phone]

## 2014-11-22 NOTE — Anesthesia Procedure Notes (Signed)
Procedure Name: Intubation Date/Time: 11/22/2014 1:55 PM Performed by: Gavin Pound, Shomari Matusik J Pre-anesthesia Checklist: Patient identified, Emergency Drugs available, Suction available, Patient being monitored and Timeout performed Patient Re-evaluated:Patient Re-evaluated prior to inductionOxygen Delivery Method: Circle system utilized Preoxygenation: Pre-oxygenation with 100% oxygen Intubation Type: IV induction Ventilation: Mask ventilation without difficulty Laryngoscope Size: Mac and 3 Grade View: Grade II Tube type: Oral Tube size: 7.0 mm Number of attempts: 1 Placement Confirmation: ETT inserted through vocal cords under direct vision,  positive ETCO2 and breath sounds checked- equal and bilateral Secured at: 21 cm Tube secured with: Tape Dental Injury: Teeth and Oropharynx as per pre-operative assessment

## 2014-11-22 NOTE — Progress Notes (Signed)
   11/22/14 1500  Clinical Encounter Type  Visited With Family  Visit Type Initial  Consult/Referral To Nurse  Chaplain responded to consult and family was in room; Pt was in surgery until later today

## 2014-11-23 DIAGNOSIS — L8994 Pressure ulcer of unspecified site, stage 4: Secondary | ICD-10-CM

## 2014-11-23 LAB — CBC
HCT: 36.4 % (ref 36.0–46.0)
Hemoglobin: 12.3 g/dL (ref 12.0–15.0)
MCH: 28 pg (ref 26.0–34.0)
MCHC: 33.8 g/dL (ref 30.0–36.0)
MCV: 82.9 fL (ref 78.0–100.0)
PLATELETS: 334 10*3/uL (ref 150–400)
RBC: 4.39 MIL/uL (ref 3.87–5.11)
RDW: 14.7 % (ref 11.5–15.5)
WBC: 17 10*3/uL — AB (ref 4.0–10.5)

## 2014-11-23 LAB — BASIC METABOLIC PANEL
Anion gap: 9 (ref 5–15)
BUN: 9 mg/dL (ref 6–20)
CALCIUM: 8.4 mg/dL — AB (ref 8.9–10.3)
CO2: 23 mmol/L (ref 22–32)
CREATININE: 0.78 mg/dL (ref 0.44–1.00)
Chloride: 105 mmol/L (ref 101–111)
GFR calc Af Amer: >60 mL/min (ref >60)
GLUCOSE: 203 mg/dL — AB (ref 65–99)
POTASSIUM: 3.7 mmol/L (ref 3.5–5.1)
SODIUM: 137 mmol/L (ref 135–145)

## 2014-11-23 MED ORDER — WHITE PETROLATUM GEL
Status: AC
  Administered 2014-11-23: 0.2
  Filled 2014-11-23: qty 1

## 2014-11-23 NOTE — Progress Notes (Signed)
1 Day Post-Op  Subjective: Looks good comfortable  Objective: Vital signs in last 24 hours: Temp:  [97.4 F (36.3 C)-99.2 F (37.3 C)] 99.2 F (37.3 C) (09/10 0538) Pulse Rate:  [86-106] 94 (09/10 0538) Resp:  [13-17] 16 (09/10 0538) BP: (136-174)/(63-109) 142/72 mmHg (09/10 0538) SpO2:  [99 %-100 %] 99 % (09/10 0538) Last BM Date: 11/21/14  Intake/Output from previous day: 09/09 0701 - 09/10 0700 In: 2317.5 [P.O.:100; I.V.:2217.5] Out: 805 [Urine:790; Blood:15] Intake/Output this shift:    Awake and alert Abdomen soft, ostomy pink  Lab Results:  No results for input(s): WBC, HGB, HCT, PLT in the last 72 hours. BMET  Recent Labs  11/21/14 0445  CREATININE 0.69   PT/INR No results for input(s): LABPROT, INR in the last 72 hours. ABG No results for input(s): PHART, HCO3 in the last 72 hours.  Invalid input(s): PCO2, PO2  Studies/Results: No results found.  Anti-infectives: Anti-infectives    Start     Dose/Rate Route Frequency Ordered Stop   11/22/14 1335  dextrose 5 % with cefUROXime (ZINACEF) ADS Med    Comments:  Forte, Lindsi   : cabinet override      11/22/14 1335 11/23/14 0144   11/22/14 0600  cefOXitin (MEFOXIN) 1 g in dextrose 5 % 50 mL IVPB  Status:  Discontinued     1 g 100 mL/hr over 30 Minutes Intravenous To Short Stay 11/21/14 0802 11/23/14 0719   11/14/14 2200  amoxicillin-clavulanate (AUGMENTIN) 875-125 MG per tablet 1 tablet  Status:  Discontinued     1 tablet Oral 2 times daily 11/14/14 1737 11/16/14 1746      Assessment/Plan: s/p Procedure(s): SACRAL BONE BIOPSY PRONE POSITION (N/A) LAPAROSCOPIC LOOP COLOSTOMY  (N/A)  Continue current care Advance diet as tolerated  LOS: 9 days    [name] Frank A 11/23/2014

## 2014-11-23 NOTE — Progress Notes (Signed)
[facility] PROGRESS NOTE  [name] Frank:[id number] DOB: 1935-01-13 DOA: 11/14/2014 PCP: Kristian Covey, MD  Brief Summary  Patient is a 80 year old African-American female with history of dementia and limited mobility spending much of her time in chair with history of reduced sensation in pelvic area due to prior back surgery. Subsequently developed stage IV decubitus ulcer. Cultures done at [facility] on 10/30/2014 grew enterococcus species which was susceptible to ampicillin and patient was started on Augmentin therapy. Was transferred here at the request of her primary care physician for further evaluation of sacral decubitus ulcer  9/9:  Bone biopsy and diverting colostomy placement   Assessment/Plan  Decubitus ulcer of sacral region, stage 4 with osteomyelitis -  Appreciate ID, plastic surgery, and general surgery assistance -  abx discontinued per ID in anticipation of biopsy  -  Biopsy with Acell placement and diverting colostomy performed on 9/9 -  Consideration of period of foley catheter placement while on antibiotics for her osteomyelitis to also help with wound healing -  F/u bone biopsy for culture data  1-2 loose stools per day chronically, not typical of C. Diff diarrhea  Essential hypertension, blood pressures low normal after surgery - hold parameter placed on losartan  Osteoarthritis - stable  Severe protein-calorie malnutrition - continue nutritional supplementation per nutrition recommendations - continue regular diet  Petechial rash - resolved after discontinuing antibiotic and amlodipine. Etiology uncertain  Leukocytosis, likely due to recent surgery and bone biopsy  Diet:  regular Access:  PIV IVF:  off Proph:  lovenox  Code Status: full Family Communication: patient and family at bedside Disposition Plan:  Plan for SNF at discharge once tolerating diet and ostomy clearly working, bone biopsy  culture  Consultants:  ID Dr. Ninetta Lights  Plastic surgery, Dr. Kelly Splinter  General Surgery, Dr. Janee Morn  Procedures:  CT abdomen and pelvis 9/2  MR pelvis 9/2  Antibiotics:  Augmentin 9/1 > 9/3  HPI/Subjective:  Feels well, denies SOB, cough, nausea, vomiting.  No back/buttock pain and even colostomy not hurting much  Objective: Filed Vitals:   11/23/14 0112 11/23/14 0538 11/23/14 0945 11/23/14 1443  BP: 139/63 142/72 144/71 105/57  Pulse: 90 94 89 88  Temp: 98.5 F (36.9 C) 99.2 F (37.3 C) 98.1 F (36.7 C) 98.5 F (36.9 C)  TempSrc: Oral Oral Oral Oral  Resp: 16 16 16 16   Height:      Weight:      SpO2: 99% 99% 100% 99%    Intake/Output Summary (Last 24 hours) at 11/23/14 1655 Last data filed at 11/23/14 1500  Gross per 24 hour  Intake 2787.5 ml  Output    850 ml  Net 1937.5 ml   Filed Weights   11/16/14 2045  Weight: 51.71 kg (114 lb)   Body mass index is 17.85 kg/(m^2).  Exam:   General:  Cachectic female, No acute distress, lying in bed  HEENT:  NCAT, MMM  Cardiovascular:  RRR, nl S1, S2 no mrg, 2+ pulses, warm extremities  Respiratory:  CTAB anteriorly, no increased WOB  Abdomen:   NABS, soft, NT/ND, colostomy with some serosanguinous fluid  MSK:   Decreased tone and bulk, no LEE  Buttock:  Removed outer edge of the foam dressing but wound just redressed 10 minutes before with additional taped dressings that I deferred removing.  Will try to reexamine tomorrow when RN does dressing change  Data Reviewed: Basic Metabolic Panel:  Recent Labs Lab 11/17/14 0700 11/21/14 0445 11/23/14 1150  NA 137  --  137  K 4.3  --  3.7  CL 103  --  105  CO2 27  --  23  GLUCOSE 92  --  203*  BUN 7  --  9  CREATININE 0.64 0.69 0.78  CALCIUM 8.4*  --  8.4*   Liver Function Tests: No results for input(s): AST, ALT, ALKPHOS, BILITOT, PROT, ALBUMIN in the last 168 hours. No results for input(s): LIPASE, AMYLASE in the last 168 hours. No results for  input(s): AMMONIA in the last 168 hours. CBC:  Recent Labs Lab 11/17/14 0700 11/23/14 1150  WBC 14.5* 17.0*  HGB 11.5* 12.3  HCT 35.4* 36.4  MCV 83.9 82.9  PLT 364 334    Recent Results (from the past 240 hour(s))  Culture, blood (routine x 2)     Status: None   Collection Time: 11/14/14  6:59 PM  Result Value Ref Range Status   Specimen Description BLOOD RIGHT HAND  Final   Special Requests IN PEDIATRIC BOTTLE 1CC  Final   Culture NO GROWTH 5 DAYS  Final   Report Status 11/19/2014 FINAL  Final  Culture, blood (routine x 2)     Status: None   Collection Time: 11/14/14  7:03 PM  Result Value Ref Range Status   Specimen Description BLOOD LEFT HAND  Final   Special Requests BOTTLES DRAWN AEROBIC AND ANAEROBIC 5CC  Final   Culture NO GROWTH 5 DAYS  Final   Report Status 11/19/2014 FINAL  Final  Surgical pcr screen     Status: None   Collection Time: 11/21/14  5:11 AM  Result Value Ref Range Status   MRSA, PCR NEGATIVE NEGATIVE Final   Staphylococcus aureus NEGATIVE NEGATIVE Final    Comment:        The Xpert SA Assay (FDA approved for NASAL specimens in patients over 35 years of age), is one component of a comprehensive surveillance program.  Test performance has been validated by [facility] for patients greater than or equal to 30 year old. It is not intended to diagnose infection nor to guide or monitor treatment.   Anaerobic culture     Status: None (Preliminary result)   Collection Time: 11/22/14  2:33 PM  Result Value Ref Range Status   Specimen Description TISSUE  Final   Special Requests SACRAL BONE BIOPSY  Final   Gram Stain PENDING  Incomplete   Culture   Final    NO ANAEROBES ISOLATED; CULTURE IN PROGRESS FOR 5 DAYS Performed at [employer] [facility]    Report Status PENDING  Incomplete     Studies: No results found.  Scheduled Meds: . donepezil  10 mg Oral QHS  . enoxaparin (LOVENOX) injection  40 mg Subcutaneous Q24H  . feeding supplement  1  Container Oral TID BM  . losartan  50 mg Oral Daily  . multivitamin with minerals  1 tablet Oral Daily  . saccharomyces boulardii  250 mg Oral BID  . sodium chloride  3 mL Intravenous Q12H  . vitamin C  500 mg Oral BID  . zinc sulfate  220 mg Oral Daily   Continuous Infusions: . lactated ringers 50 mL/hr at 11/22/14 1339    Principal Problem:   Decubitus ulcer of sacral region, stage 4 Active Problems:   Essential hypertension   Osteoarthritis   Protein-calorie malnutrition   Stage IV decubitus ulcer   Protein-calorie malnutrition, severe   Osteomyelitis, pelvic region and thigh   Goals of care,  counseling/discussion   Enterococcus faecalis infection   Palliative care encounter   DNR (do not resuscitate) discussion    Time spent: 30 min    Astrid Vides, [facility]  [facility] Pager [phone]. If 7PM-7AM, please contact night-coverage at [email or web address], password [facility] 11/23/2014, 4:55 PM  LOS: 9 days

## 2014-11-24 ENCOUNTER — Inpatient Hospital Stay (HOSPITAL_COMMUNITY): Payer: Medicare Other

## 2014-11-24 DIAGNOSIS — N390 Urinary tract infection, site not specified: Secondary | ICD-10-CM | POA: Insufficient documentation

## 2014-11-24 DIAGNOSIS — R5082 Postprocedural fever: Secondary | ICD-10-CM

## 2014-11-24 LAB — URINALYSIS, ROUTINE W REFLEX MICROSCOPIC
Bilirubin Urine: NEGATIVE
Glucose, UA: NEGATIVE mg/dL
KETONES UR: NEGATIVE mg/dL
Nitrite: NEGATIVE
PROTEIN: 30 mg/dL — AB
Specific Gravity, Urine: 1.019 (ref 1.005–1.030)
UROBILINOGEN UA: 0.2 mg/dL (ref 0.0–1.0)
pH: 5.5 (ref 5.0–8.0)

## 2014-11-24 LAB — URINE MICROSCOPIC-ADD ON

## 2014-11-24 MED ORDER — CEFTRIAXONE SODIUM 1 G IJ SOLR
1.0000 g | INTRAMUSCULAR | Status: DC
  Administered 2014-11-24 – 2014-11-25 (×2): 1 g via INTRAVENOUS
  Filled 2014-11-24 (×3): qty 10

## 2014-11-24 MED ORDER — VANCOMYCIN HCL 500 MG IV SOLR
500.0000 mg | Freq: Two times a day (BID) | INTRAVENOUS | Status: DC
  Administered 2014-11-24 – 2014-11-26 (×4): 500 mg via INTRAVENOUS
  Filled 2014-11-24 (×7): qty 500

## 2014-11-24 NOTE — Progress Notes (Addendum)
ANTIBIOTIC CONSULT NOTE - INITIAL  Pharmacy Consult for Vancomycin Indication: osteomyelitis  Allergies  Allergen Reactions  . Codeine Sulfate Other (See Comments)    stomach pains  . Tramadol Hcl Other (See Comments)    stomach cramps    Patient Measurements: Height: 5\' 7"  (170.2 cm) Weight: 114 lb (51.71 kg) IBW/kg (Calculated) : 61.6  Labs:  Recent Labs  11/23/14 1150  WBC 17.0*  HGB 12.3  PLT 334  CREATININE 0.78   Estimated Creatinine Clearance: 48.1 mL/min (by C-G formula based on Cr of 0.78). No results for input(s): VANCOTROUGH, VANCOPEAK, VANCORANDOM, GENTTROUGH, GENTPEAK, GENTRANDOM, TOBRATROUGH, TOBRAPEAK, TOBRARND, AMIKACINPEAK, AMIKACINTROU, AMIKACIN in the last 72 hours.   Microbiology: Recent Results (from the past 720 hour(s))  Wound culture     Status: None   Collection Time: 10/30/14  8:59 AM  Result Value Ref Range Status   Specimen Description ABSCESS  Final   Special Requests NONE  Final   Gram Stain   Final    FEW WBC SEEN MODERATE GRAM NEGATIVE RODS MANY GRAM POSITIVE COCCI IN PAIRS    Culture   Final    LIGHT GROWTH ENTEROCOCCUS AVIUM WITH NORMAL SKIN FLORA    Report Status 11/04/2014 FINAL  Final   Organism ID, Bacteria ENTEROCOCCUS AVIUM  Final      Susceptibility   Enterococcus avium - MIC*    AMPICILLIN <=2 SENSITIVE Sensitive     LINEZOLID Value in next row Sensitive      SENSITIVE2    * LIGHT GROWTH ENTEROCOCCUS AVIUM  Culture, blood (routine x 2)     Status: None   Collection Time: 11/14/14  6:59 PM  Result Value Ref Range Status   Specimen Description BLOOD RIGHT HAND  Final   Special Requests IN PEDIATRIC BOTTLE 1CC  Final   Culture NO GROWTH 5 DAYS  Final   Report Status 11/19/2014 FINAL  Final  Culture, blood (routine x 2)     Status: None   Collection Time: 11/14/14  7:03 PM  Result Value Ref Range Status   Specimen Description BLOOD LEFT HAND  Final   Special Requests BOTTLES DRAWN AEROBIC AND ANAEROBIC 5CC  Final    Culture NO GROWTH 5 DAYS  Final   Report Status 11/19/2014 FINAL  Final  Surgical pcr screen     Status: None   Collection Time: 11/21/14  5:11 AM  Result Value Ref Range Status   MRSA, PCR NEGATIVE NEGATIVE Final   Staphylococcus aureus NEGATIVE NEGATIVE Final    Comment:        The Xpert SA Assay (FDA approved for NASAL specimens in patients over 32 years of age), is one component of a comprehensive surveillance program.  Test performance has been validated by [facility] for patients greater than or equal to 33 year old. It is not intended to diagnose infection nor to guide or monitor treatment.   Anaerobic culture     Status: None (Preliminary result)   Collection Time: 11/22/14  2:33 PM  Result Value Ref Range Status   Specimen Description TISSUE  Final   Special Requests SACRAL BONE BIOPSY  Final   Gram Stain PENDING  Incomplete   Culture   Final    NO ANAEROBES ISOLATED; CULTURE IN PROGRESS FOR 5 DAYS Performed at [facility]    Report Status PENDING  Incomplete  Tissue culture     Status: None (Preliminary result)   Collection Time: 11/22/14  2:33 PM  Result Value Ref  Range Status   Specimen Description TISSUE  Final   Special Requests SACRAL BONE BIOPSY  Final   Gram Stain PENDING  Incomplete   Culture   Final    FEW STAPHYLOCOCCUS AUREUS Note: RIFAMPIN AND GENTAMICIN SHOULD NOT BE USED AS SINGLE DRUGS FOR TREATMENT OF STAPH INFECTIONS. Performed at [facility]    Report Status PENDING  Incomplete    Medical History: Past Medical History  Diagnosis Date  . Allergy   . Hyperlipidemia   . Hypertension   . Anxiety   . GERD (gastroesophageal reflux disease)   . Urinary frequency   . Neurogenic urinary incontinence     since back surgery  . Arthritis     degenerative oateoarthritis  . Decubital ulcer 11/14/2014  . Dementia     Assessment: 80 year old female to begin vancomycin for stage 4 decubitus ulcer with  osteomyelitis  Goal of Therapy:  Vancomycin trough level 15-20 mcg/ml  Plan:  Vancomycin 500 mg iv Q 12 hours Follow up Scr, cultures, progress  Thank you Okey Regal, PharmD [phone]  11/24/2014,3:37 PM   ===========================   Addendum:  - add Rocephin for UTI   Plan: - Rocephin 1gm IV Q24H - Pharmacy will sign off of Rocephin dosing.  Thank you for the consult!   Twylah Bennetts D. Laney Potash, PharmD, BCPS Pager:  [phone] 11/24/2014, 6:15 PM

## 2014-11-24 NOTE — Progress Notes (Signed)
[facility] PROGRESS NOTE  [name] Frank:[id number] DOB: [birth year] DOA: 11/14/2014 PCP: Kristian Covey, MD  Brief Summary  Patient is a 80 year old African-American female with history of dementia and limited mobility spending much of her time in chair with history of reduced sensation in pelvic area due to prior back surgery. Subsequently developed stage IV decubitus ulcer. Cultures done at [facility] on 10/30/2014 grew enterococcus species which was susceptible to ampicillin and patient was started on Augmentin therapy. Was transferred here at the request of her primary care physician for further evaluation of sacral decubitus ulcer  9/9:  Bone biopsy and diverting colostomy placement   Assessment/Plan  Decubitus ulcer of sacral region, stage 4 with osteomyelitis, Colostomy appears to be healing well and formed stool coming out  -  Appreciate ID, plastic surgery, and general surgery assistance -  Biopsy with Acell placement and diverting colostomy performed on 9/9 -  Consideration of period of foley catheter placement while on antibiotics for her osteomyelitis to also help with wound healing -  Bone biopsy growing S. Aureus > start vancomycin pending sensitivities  Fever, could be related to osteomyelitis -  CXR:  No infiltrate -  UA:  TNTC WBC -  Blood cultures  -  Start vanc for osteo, see above -  Start ceftriaxone for possible UTI -  D/c foley catheter  Essential hypertension, blood pressures low normal after surgery - hold parameter placed on losartan  Osteoarthritis - stable  Severe protein-calorie malnutrition - continue nutritional supplementation per nutrition recommendations - continue regular diet  Petechial rash - resolved after discontinuing antibiotic and amlodipine. Etiology uncertain  Leukocytosis, likely due to recent surgery and bone biopsy  Diet:  regular Access:  PIV IVF:  off Proph:  lovenox  Code Status:  full Family Communication: patient and family at bedside Disposition Plan:  Plan for SNF at discharge once tolerating diet and ostomy clearly working, bone biopsy culture, fever and WBC improved  Consultants:  ID Dr. Ninetta Lights  Plastic surgery, Dr. Kelly Splinter  General Surgery, Dr. Janee Morn  Procedures:  CT abdomen and pelvis 9/2  MR pelvis 9/2  Antibiotics:  Augmentin 9/1 > 9/3  Vancomycin 9/11 >  Ceftriaxone 9/11 >  HPI/Subjective:  Feels well, denies SOB, cough, nausea, vomiting.  No back/buttock pain.  Feels like she is sliding out of chair  Objective: Filed Vitals:   11/23/14 2232 11/24/14 0555 11/24/14 1044 11/24/14 1508  BP: 113/52 140/56 126/59 114/52  Pulse: 105 105 100 89  Temp: 100.9 F (38.3 C) 99.6 F (37.6 C)  99.1 F (37.3 C)  TempSrc: Oral Oral  Oral  Resp: 16 16 16 16   Height:      Weight:      SpO2: 98% 97% 96% 99%    Intake/Output Summary (Last 24 hours) at 11/24/14 1809 Last data filed at 11/24/14 1509  Gross per 24 hour  Intake 895.83 ml  Output    425 ml  Net 470.83 ml   Filed Weights   11/16/14 2045  Weight: 51.71 kg (114 lb)   Body mass index is 17.85 kg/(m^2).  Exam:   General:  Cachectic female, No acute distress, sitting on several pillows on chair  HEENT:  NCAT, MMM  Cardiovascular:  RRR, nl S1, S2 no mrg, 2+ pulses, warm extremities  Respiratory:  CTAB anteriorly, no increased WOB  Abdomen:   NABS, soft, NT/ND, colostomy with brown stool  MSK:   Decreased tone and bulk, no LEE  Buttock:  Not examined.  Sitting in chair  Data Reviewed: Basic Metabolic Panel:  Recent Labs Lab 11/21/14 0445 11/23/14 1150  NA  --  137  K  --  3.7  CL  --  105  CO2  --  23  GLUCOSE  --  203*  BUN  --  9  CREATININE 0.69 0.78  CALCIUM  --  8.4*   Liver Function Tests: No results for input(s): AST, ALT, ALKPHOS, BILITOT, PROT, ALBUMIN in the last 168 hours. No results for input(s): LIPASE, AMYLASE in the last 168 hours. No  results for input(s): AMMONIA in the last 168 hours. CBC:  Recent Labs Lab 11/23/14 1150  WBC 17.0*  HGB 12.3  HCT 36.4  MCV 82.9  PLT 334    Recent Results (from the past 240 hour(s))  Culture, blood (routine x 2)     Status: None   Collection Time: 11/14/14  6:59 PM  Result Value Ref Range Status   Specimen Description BLOOD RIGHT HAND  Final   Special Requests IN PEDIATRIC BOTTLE 1CC  Final   Culture NO GROWTH 5 DAYS  Final   Report Status 11/19/2014 FINAL  Final  Culture, blood (routine x 2)     Status: None   Collection Time: 11/14/14  7:03 PM  Result Value Ref Range Status   Specimen Description BLOOD LEFT HAND  Final   Special Requests BOTTLES DRAWN AEROBIC AND ANAEROBIC 5CC  Final   Culture NO GROWTH 5 DAYS  Final   Report Status 11/19/2014 FINAL  Final  Surgical pcr screen     Status: None   Collection Time: 11/21/14  5:11 AM  Result Value Ref Range Status   MRSA, PCR NEGATIVE NEGATIVE Final   Staphylococcus aureus NEGATIVE NEGATIVE Final    Comment:        The Xpert SA Assay (FDA approved for NASAL specimens in patients over 78 years of age), is one component of a comprehensive surveillance program.  Test performance has been validated by [facility] for patients greater than or equal to 71 year old. It is not intended to diagnose infection nor to guide or monitor treatment.   Anaerobic culture     Status: None (Preliminary result)   Collection Time: 11/22/14  2:33 PM  Result Value Ref Range Status   Specimen Description TISSUE  Final   Special Requests SACRAL BONE BIOPSY  Final   Gram Stain PENDING  Incomplete   Culture   Final    NO ANAEROBES ISOLATED; CULTURE IN PROGRESS FOR 5 DAYS Performed at [facility]    Report Status PENDING  Incomplete  Tissue culture     Status: None (Preliminary result)   Collection Time: 11/22/14  2:33 PM  Result Value Ref Range Status   Specimen Description TISSUE  Final   Special Requests SACRAL BONE  BIOPSY  Final   Gram Stain PENDING  Incomplete   Culture   Final    FEW STAPHYLOCOCCUS AUREUS Note: RIFAMPIN AND GENTAMICIN SHOULD NOT BE USED AS SINGLE DRUGS FOR TREATMENT OF STAPH INFECTIONS. Performed at [facility]    Report Status PENDING  Incomplete     Studies: Dg Chest Port 1 View  11/24/2014   CLINICAL DATA:  Fever.  EXAM: PORTABLE CHEST - 1 VIEW  COMPARISON:  07/19/2012.  FINDINGS: The heart size and mediastinal contours are within normal limits. Both lungs are clear. The visualized skeletal structures are unremarkable. Thoracic atherosclerosis. Degenerative change thoracic spine.  IMPRESSION:  No active disease.  Stable exam.   Electronically Signed   By: Elsie Stain M.D.   On: 11/24/2014 08:39    Scheduled Meds: . donepezil  10 mg Oral QHS  . enoxaparin (LOVENOX) injection  40 mg Subcutaneous Q24H  . feeding supplement  1 Container Oral TID BM  . losartan  50 mg Oral Daily  . multivitamin with minerals  1 tablet Oral Daily  . saccharomyces boulardii  250 mg Oral BID  . sodium chloride  3 mL Intravenous Q12H  . vancomycin  500 mg Intravenous Q12H  . vitamin C  500 mg Oral BID  . zinc sulfate  220 mg Oral Daily   Continuous Infusions: . lactated ringers 50 mL/hr at 11/23/14 1808    Principal Problem:   Decubitus ulcer of sacral region, stage 4 Active Problems:   Essential hypertension   Osteoarthritis   Protein-calorie malnutrition   Stage IV decubitus ulcer   Protein-calorie malnutrition, severe   Osteomyelitis, pelvic region and thigh   Goals of care, counseling/discussion   Enterococcus faecalis infection   Palliative care encounter   DNR (do not resuscitate) discussion    Time spent: 30 min    Sheria Rosello, [facility]  [facility] Pager [phone]. If 7PM-7AM, please contact night-coverage at [email or web address], password [email or web address] 11/24/2014, 6:09 PM  LOS: 10 days

## 2014-11-24 NOTE — Progress Notes (Signed)
2 Days Post-Op  Subjective: No acute changes  Objective: Vital signs in last 24 hours: Temp:  [98.1 F (36.7 C)-100.9 F (38.3 C)] 100.9 F (38.3 C) (09/10 2232) Pulse Rate:  [88-105] 105 (09/10 2232) Resp:  [16] 16 (09/10 2232) BP: (105-144)/(52-71) 113/52 mmHg (09/10 2232) SpO2:  [98 %-100 %] 98 % (09/10 2232) Last BM Date: 11/21/14  Intake/Output from previous day: 09/10 0701 - 09/11 0700 In: 2665.8 [P.O.:840; I.V.:1825.8] Out: 600 [Urine:600] Intake/Output this shift:    Abdomen soft, ostomy pink  Lab Results:   Recent Labs  11/23/14 1150  WBC 17.0*  HGB 12.3  HCT 36.4  PLT 334   BMET  Recent Labs  11/23/14 1150  NA 137  K 3.7  CL 105  CO2 23  GLUCOSE 203*  BUN 9  CREATININE 0.78  CALCIUM 8.4*   PT/INR No results for input(s): LABPROT, INR in the last 72 hours. ABG No results for input(s): PHART, HCO3 in the last 72 hours.  Invalid input(s): PCO2, PO2  Studies/Results: No results found.  Anti-infectives: Anti-infectives    Start     Dose/Rate Route Frequency Ordered Stop   11/22/14 1335  dextrose 5 % with cefUROXime (ZINACEF) ADS Med    Comments:  Forte, Lindsi   : cabinet override      11/22/14 1335 11/23/14 0144   11/22/14 0600  cefOXitin (MEFOXIN) 1 g in dextrose 5 % 50 mL IVPB  Status:  Discontinued     1 g 100 mL/hr over 30 Minutes Intravenous To Short Stay 11/21/14 0802 11/23/14 0719   11/14/14 2200  amoxicillin-clavulanate (AUGMENTIN) 875-125 MG per tablet 1 tablet  Status:  Discontinued     1 tablet Oral 2 times daily 11/14/14 1737 11/16/14 1746      Assessment/Plan: s/p Procedure(s): SACRAL BONE BIOPSY PRONE POSITION (N/A) LAPAROSCOPIC LOOP COLOSTOMY  (N/A)  Ok to advance diet from surgical standpoint Wound care Awaiting biopsy results  LOS: 10 days    Anhelica Fowers A 11/24/2014

## 2014-11-25 ENCOUNTER — Encounter (HOSPITAL_COMMUNITY): Payer: Self-pay | Admitting: General Surgery

## 2014-11-25 DIAGNOSIS — A4901 Methicillin susceptible Staphylococcus aureus infection, unspecified site: Secondary | ICD-10-CM

## 2014-11-25 DIAGNOSIS — N390 Urinary tract infection, site not specified: Secondary | ICD-10-CM

## 2014-11-25 DIAGNOSIS — T8351XD Infection and inflammatory reaction due to indwelling urinary catheter, subsequent encounter: Secondary | ICD-10-CM

## 2014-11-25 DIAGNOSIS — R7881 Bacteremia: Secondary | ICD-10-CM

## 2014-11-25 DIAGNOSIS — M869 Osteomyelitis, unspecified: Secondary | ICD-10-CM

## 2014-11-25 LAB — TISSUE CULTURE: GRAM STAIN: NONE SEEN

## 2014-11-25 LAB — BASIC METABOLIC PANEL
Anion gap: 6 (ref 5–15)
BUN: 5 mg/dL — AB (ref 6–20)
CALCIUM: 7.8 mg/dL — AB (ref 8.9–10.3)
CO2: 26 mmol/L (ref 22–32)
CREATININE: 0.58 mg/dL (ref 0.44–1.00)
Chloride: 105 mmol/L (ref 101–111)
GFR calc non Af Amer: >60 mL/min (ref >60)
Glucose, Bld: 85 mg/dL (ref 65–99)
Potassium: 3.2 mmol/L — ABNORMAL LOW (ref 3.5–5.1)
SODIUM: 137 mmol/L (ref 135–145)

## 2014-11-25 LAB — CBC
HCT: 30.5 % — ABNORMAL LOW (ref 36.0–46.0)
Hemoglobin: 9.8 g/dL — ABNORMAL LOW (ref 12.0–15.0)
MCH: 27 pg (ref 26.0–34.0)
MCHC: 32.1 g/dL (ref 30.0–36.0)
MCV: 84 fL (ref 78.0–100.0)
PLATELETS: 306 10*3/uL (ref 150–400)
RBC: 3.63 MIL/uL — AB (ref 3.87–5.11)
RDW: 14.4 % (ref 11.5–15.5)
WBC: 16 10*3/uL — AB (ref 4.0–10.5)

## 2014-11-25 MED ORDER — POTASSIUM CHLORIDE CRYS ER 20 MEQ PO TBCR
40.0000 meq | EXTENDED_RELEASE_TABLET | Freq: Once | ORAL | Status: AC
  Administered 2014-11-25: 40 meq via ORAL
  Filled 2014-11-25: qty 2

## 2014-11-25 MED ORDER — MUPIROCIN 2 % EX OINT
1.0000 "application " | TOPICAL_OINTMENT | Freq: Two times a day (BID) | CUTANEOUS | Status: DC
  Administered 2014-11-25 – 2014-11-26 (×3): 1 via NASAL
  Filled 2014-11-25 (×2): qty 22

## 2014-11-25 MED ORDER — CHLORHEXIDINE GLUCONATE CLOTH 2 % EX PADS
6.0000 | MEDICATED_PAD | Freq: Every day | CUTANEOUS | Status: DC
  Administered 2014-11-25 – 2014-11-26 (×2): 6 via TOPICAL

## 2014-11-25 NOTE — Consult Note (Addendum)
WOC ostomy follow up Stoma type/location: LLQ, loop colostomy with support rod in place (sutured down) Stomal assessment/size: 1 3/4 " round, budded from the skin Peristomal assessment: denudation from 2-6 o'clock, apparently with presence of rod over the weekend pouch leaked frequently on the skin.  Treatment options for stomal/peristomal skin: used 2" barrier rings cut in 1/2 to cover rod and create flat surface over rod at 3 and 9 o'clock  Output thick, formed yellow/brown stool  Ostomy pouching: 2pc. 2 3/4"  Education provided: discussed pouch change with patient, she is planning rehab stay. Educational materials given to son last week at the time when I marked her.  Enrolled patient in [place] Secure Start Discharge program: Yes  WOC will follow along with you for continued support with ostomy teaching and care Amila Callies Eliberto Ivory RN,CWOCN [phone]  Noted sacral pressure ulcer currently managed by surgical team with BID dressing changes.

## 2014-11-25 NOTE — Progress Notes (Signed)
Physical Therapy Treatment Patient Details Name: [name] Frank MRN: [id number] DOB: 1934-10-08 Today's Date: 11/25/2014    History of Present Illness Pt admitted with stage 4 sacral decubitus ulcer with osteomyelitis.    PT Comments    Patient requiring assistance with mobility at this time. Based upon the patient's current level of mobility and assistance needed, recommending SNF. Patient pleasant and cooperative throughout session.   Follow Up Recommendations  SNF;Supervision/Assistance - 24 hour     Equipment Recommendations  Rolling walker with 5" wheels    Recommendations for Other Services       Precautions / Restrictions Precautions Precautions: Fall Precaution Comments: Pressure relief every 30 min when OOB due to Stage IV wound; In bed roll to side every 1-2 hours Restrictions Weight Bearing Restrictions: No    Mobility  Bed Mobility               General bed mobility comments: found and returned to sitting  Transfers Overall transfer level: Needs assistance Equipment used: Rolling walker (2 wheeled) Transfers: Sit to/from Stand Sit to Stand: Mod assist         General transfer comment: additional assistance due to placement of pillows for pressure relief   Ambulation/Gait Ambulation/Gait assistance: Min guard Ambulation Distance (Feet): 125 Feet Assistive device: Rolling walker (2 wheeled) Gait Pattern/deviations: Step-through pattern;Decreased step length - right;Decreased step length - left Gait velocity: decreased   General Gait Details: cues for increased stride lenght and posture throughout   Stairs            Wheelchair Mobility    Modified Rankin (Stroke Patients Only)       Balance Overall balance assessment: Needs assistance Sitting-balance support: No upper extremity supported Sitting balance-Leahy Scale: Fair     Standing balance support: Bilateral upper extremity supported Standing balance-Leahy Scale:  Poor Standing balance comment: using rw                    Cognition Arousal/Alertness: Awake/alert Behavior During Therapy: WFL for tasks assessed/performed Overall Cognitive Status: History of cognitive impairments - at baseline                      Exercises      General Comments        Pertinent Vitals/Pain Pain Assessment: No/denies pain    Home Living                      Prior Function            PT Goals (current goals can now be found in the care plan section) Acute Rehab PT Goals Patient Stated Goal: get better PT Goal Formulation: With family Time For Goal Achievement: 11/29/14 Potential to Achieve Goals: Fair Progress towards PT goals: Progressing toward goals    Frequency  Min 3X/week    PT Plan Current plan remains appropriate    Co-evaluation             End of Session Equipment Utilized During Treatment: Gait belt Activity Tolerance: Patient tolerated treatment well Patient left: in chair;with call bell/phone within reach;with chair alarm set     Time: 4098-1191 PT Time Calculation (min) (ACUTE ONLY): 18 min  Charges:  $Gait Training: 8-22 mins                    G Codes:      Christiane Ha, PT, CSCS Pager [phone]  Office [phone]  11/25/2014, 4:07 PM

## 2014-11-25 NOTE — Progress Notes (Signed)
[facility] PROGRESS NOTE  ALFHILD PARTCH GNF:[id number] DOB: 07-Apr-1934 DOA: 11/14/2014 PCP: Kristian Covey, MD  Brief Summary  Patient is a 80 year old African-American female with history of dementia and limited mobility spending much of her time in chair with history of reduced sensation in pelvic area due to prior back surgery. Subsequently developed stage IV decubitus ulcer. Cultures done at [facility] on 10/30/2014 grew enterococcus species which was susceptible to ampicillin and patient was started on Augmentin therapy. Was transferred here at the request of her primary care physician for further evaluation of sacral decubitus ulcer  9/9:  Bone biopsy and diverting colostomy placement   Assessment/Plan  Decubitus ulcer of sacral region, stage 4 with osteomyelitis, Colostomy appears to be healing well and formed stool coming out  -  Appreciate ID, plastic surgery, and general surgery assistance -  Biopsy with Acell placement and diverting colostomy performed on 9/9 -  Consideration of period of foley catheter placement while on antibiotics for her osteomyelitis to also help with wound healing -  Bone biopsy growing MRSA -  Plan for PICC line after blood culture from 9/12 negative for at least 24-48h and then plan for 6 weeks of IV antibiotics  Fever, could be related to osteomyelitis -  CXR:  No infiltrate -  UA:  TNTC WBC -  Blood cultures:  One of two blood cultures growing GPC  -  Start vanc for osteo, see above -  Start ceftriaxone for possible UTI -  D/c foley catheter -  Repeat blood culture today -  ECHO to eval for endocarditis given murmur  Essential hypertension, blood pressures low normal after surgery - hold parameter placed on losartan  Osteoarthritis - stable  Severe protein-calorie malnutrition - continue nutritional supplementation per nutrition recommendations - continue regular diet  Petechial rash - resolved after  discontinuing antibiotic and amlodipine. Etiology uncertain  Leukocytosis, likely due to recent surgery and bone biopsy  Diet:  regular Access:  PIV IVF:  off Proph:  lovenox  Code Status: full Family Communication: patient and family at bedside Disposition Plan:  Plan for SNF at discharge once tolerating diet and ostomy clearly working, bone biopsy culture, fever and WBC improved  Consultants:  ID Dr. Ninetta Lights  Plastic surgery, Dr. Kelly Splinter  General Surgery, Dr. Janee Morn  Procedures:  CT abdomen and pelvis 9/2  MR pelvis 9/2  Antibiotics:  Augmentin 9/1 > 9/3  Vancomycin 9/11 >  Ceftriaxone 9/11 >  HPI/Subjective:  Feels well, denies SOB, cough, nausea, vomiting.  Problems with ostomy bag leaking and needing replacement frequently.  Blood culture positive from date of fever.    Objective: Filed Vitals:   11/24/14 1508 11/24/14 2327 11/25/14 0500 11/25/14 1427  BP: 114/52 130/59 126/64 139/56  Pulse: 89 86 95 107  Temp: 99.1 F (37.3 C) 98.8 F (37.1 C) 98.6 F (37 C) 97.4 F (36.3 C)  TempSrc: Oral Oral Oral Oral  Resp: 16 18 18 18   Height:      Weight:      SpO2: 99% 99% 100% 100%    Intake/Output Summary (Last 24 hours) at 11/25/14 1636 Last data filed at 11/25/14 1427  Gross per 24 hour  Intake   1357 ml  Output    900 ml  Net    457 ml   Filed Weights   11/16/14 2045  Weight: 51.71 kg (114 lb)   Body mass index is 17.85 kg/(m^2).  Exam:   General:  Cachectic female, No acute  distress, sitting up in bed  HEENT:  NCAT, MMM  Cardiovascular:  RRR, nl S1, S2 no mrg, 2+ pulses, warm extremities  Respiratory:  CTAB anteriorly, no increased WOB  Abdomen:   NABS, soft, NT/ND, colostomy with formed brown stool  MSK:   Decreased tone and bulk, no LEE  Buttock:  Not examined.    Data Reviewed: Basic Metabolic Panel:  Recent Labs Lab 11/21/14 0445 11/23/14 1150 11/25/14 0348  NA  --  137 137  K  --  3.7 3.2*  CL  --  105 105  CO2   --  23 26  GLUCOSE  --  203* 85  BUN  --  9 5*  CREATININE 0.69 0.78 0.58  CALCIUM  --  8.4* 7.8*   Liver Function Tests: No results for input(s): AST, ALT, ALKPHOS, BILITOT, PROT, ALBUMIN in the last 168 hours. No results for input(s): LIPASE, AMYLASE in the last 168 hours. No results for input(s): AMMONIA in the last 168 hours. CBC:  Recent Labs Lab 11/23/14 1150 11/25/14 0348  WBC 17.0* 16.0*  HGB 12.3 9.8*  HCT 36.4 30.5*  MCV 82.9 84.0  PLT 334 306    Recent Results (from the past 240 hour(s))  Surgical pcr screen     Status: None   Collection Time: 11/21/14  5:11 AM  Result Value Ref Range Status   MRSA, PCR NEGATIVE NEGATIVE Final   Staphylococcus aureus NEGATIVE NEGATIVE Final    Comment:        The Xpert SA Assay (FDA approved for NASAL specimens in patients over 16 years of age), is one component of a comprehensive surveillance program.  Test performance has been validated by [facility] for patients greater than or equal to 34 year old. It is not intended to diagnose infection nor to guide or monitor treatment.   Anaerobic culture     Status: None (Preliminary result)   Collection Time: 11/22/14  2:33 PM  Result Value Ref Range Status   Specimen Description TISSUE  Final   Special Requests SACRAL BONE BIOPSY  Final   Gram Stain   Final    NO WBC SEEN NO SQUAMOUS EPITHELIAL CELLS SEEN NO ORGANISMS SEEN Performed at [facility]    Culture   Final    NO ANAEROBES ISOLATED; CULTURE IN PROGRESS FOR 5 DAYS Performed at [facility]    Report Status PENDING  Incomplete  Tissue culture     Status: None   Collection Time: 11/22/14  2:33 PM  Result Value Ref Range Status   Specimen Description TISSUE  Final   Special Requests SACRAL BONE BIOPSY  Final   Gram Stain   Final    NO WBC SEEN NO SQUAMOUS EPITHELIAL CELLS SEEN NO ORGANISMS SEEN Performed at [facility]    Culture   Final    FEW METHICILLIN RESISTANT  STAPHYLOCOCCUS AUREUS Note: RIFAMPIN AND GENTAMICIN SHOULD NOT BE USED AS SINGLE DRUGS FOR TREATMENT OF STAPH INFECTIONS. This organism DOES NOT demonstrate inducible Clindamycin resistance in vitro. CRITICAL RESULT CALLED TO, READ BACK BY AND VERIFIED WITH: PATTY MOSS @ 9:30  AM 11/25/14 BY DWEEKS Performed at [facility]    Report Status 11/25/2014 FINAL  Final   Organism ID, Bacteria METHICILLIN RESISTANT STAPHYLOCOCCUS AUREUS  Final      Susceptibility   Methicillin resistant staphylococcus aureus - MIC*    CLINDAMYCIN <=0.25 SENSITIVE Sensitive     ERYTHROMYCIN >=8 RESISTANT Resistant  GENTAMICIN <=0.5 SENSITIVE Sensitive     LEVOFLOXACIN <=0.12 SENSITIVE Sensitive     OXACILLIN >=4 RESISTANT Resistant     RIFAMPIN <=0.5 SENSITIVE Sensitive     TRIMETH/SULFA <=10 SENSITIVE Sensitive     VANCOMYCIN 1 SENSITIVE Sensitive     TETRACYCLINE <=1 SENSITIVE Sensitive     * FEW METHICILLIN RESISTANT STAPHYLOCOCCUS AUREUS  Culture, blood (routine x 2)     Status: None (Preliminary result)   Collection Time: 11/24/14  8:31 AM  Result Value Ref Range Status   Specimen Description BLOOD LEFT HAND  Final   Special Requests BOTTLES DRAWN AEROBIC AND ANAEROBIC 10CC  Final   Culture NO GROWTH 1 DAY  Final   Report Status PENDING  Incomplete  Culture, blood (routine x 2)     Status: None (Preliminary result)   Collection Time: 11/24/14  8:37 AM  Result Value Ref Range Status   Specimen Description BLOOD RIGHT HAND  Final   Special Requests BOTTLES DRAWN AEROBIC AND ANAEROBIC 10CC  Final   Culture  Setup Time   Final    GRAM POSITIVE COCCI IN CLUSTERS AEROBIC BOTTLE ONLY CRITICAL RESULT CALLED TO, READ BACK BY AND VERIFIED WITH: L BLACKWELL,RN AT 1159 BY L BENFIELD    Culture GRAM POSITIVE COCCI  Final   Report Status PENDING  Incomplete  Culture, Urine     Status: None (Preliminary result)   Collection Time: 11/24/14 11:27 AM  Result Value Ref Range Status   Specimen  Description URINE, CATHETERIZED  Final   Special Requests NONE  Final   Culture CULTURE REINCUBATED FOR BETTER GROWTH  Final   Report Status PENDING  Incomplete     Studies: Dg Chest Port 1 View  11/24/2014   CLINICAL DATA:  Fever.  EXAM: PORTABLE CHEST - 1 VIEW  COMPARISON:  07/19/2012.  FINDINGS: The heart size and mediastinal contours are within normal limits. Both lungs are clear. The visualized skeletal structures are unremarkable. Thoracic atherosclerosis. Degenerative change thoracic spine.  IMPRESSION: No active disease.  Stable exam.   Electronically Signed   By: Elsie Stain M.D.   On: 11/24/2014 08:39    Scheduled Meds: . cefTRIAXone (ROCEPHIN)  IV  1 g Intravenous Q24H  . Chlorhexidine Gluconate Cloth  6 each Topical Q0600  . donepezil  10 mg Oral QHS  . enoxaparin (LOVENOX) injection  40 mg Subcutaneous Q24H  . feeding supplement  1 Container Oral TID BM  . losartan  50 mg Oral Daily  . multivitamin with minerals  1 tablet Oral Daily  . mupirocin ointment  1 application Nasal BID  . saccharomyces boulardii  250 mg Oral BID  . sodium chloride  3 mL Intravenous Q12H  . vancomycin  500 mg Intravenous Q12H  . vitamin C  500 mg Oral BID  . zinc sulfate  220 mg Oral Daily   Continuous Infusions:    Principal Problem:   Decubitus ulcer of sacral region, stage 4 Active Problems:   Essential hypertension   Osteoarthritis   Protein-calorie malnutrition   Stage IV decubitus ulcer   Protein-calorie malnutrition, severe   Osteomyelitis, pelvic region and thigh   Goals of care, counseling/discussion   Enterococcus faecalis infection   Palliative care encounter   DNR (do not resuscitate) discussion   Postprocedural fever   UTI (urinary tract infection)    Time spent: 30 min    Anaiz Qazi  [facility] Pager [phone]. If 7PM-7AM, please contact night-coverage at [email or web address], password [facility] 11/25/2014,  4:36 PM  LOS: 11 days

## 2014-11-25 NOTE — Clinical Social Work Note (Signed)
Clinical Social Work Assessment  Patient Details  Name: [name] Frank MRN: [id number] Date of Birth: [birth year]  Date of referral:  11/25/14               Reason for consult:  Facility Placement, Discharge Planning                Permission sought to share information with:  Facility Medical sales representative, Family Supports Permission granted to share information::  Yes, Verbal Permission Granted  Name::     [name]  Agency::  [facility]  Relationship::  Son  Contact Information:  [phone]  Housing/Transportation Living arrangements for the past 2 months:  Single Family Home Source of Information:  Adult Children Patient Interpreter Needed:  None Criminal Activity/Legal Involvement Pertinent to Current Situation/Hospitalization:  No - Comment as needed Significant Relationships:  Adult Children, Spouse Lives with:  Spouse Do you feel safe going back to the place where you live?  No (High fall risk.) Need for family participation in patient care:  Yes (Comment) (Patient's adult children active in patient's care.)  Care giving concerns:  Patient's family expressed no concerns at this time.   Social Worker assessment / plan:  CSW received referral for possible SNF placement at time of discharge. CSW spoke with patient's family regarding discharge disposition. Patient's family understanding and agreeable to PT recommendation for SNF placement at time of discharge. Per patient's family, patient's family has no preference for SNF choice at this time. CSW to continue to follow and assist with discharge planning needs.  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  Patient/Family's  Understanding of and Emotional Response to Diagnosis, Current Treatment, and Prognosis:  Patient's family understanding and agreeable to CSW plan of care.  Emotional Assessment Appearance:  Other (Comment Required (CSW spoke with patient's son, Zollie Beckers, via phone as patient oriented to self only.) Attitude/Demeanor/Rapport:  Other (CSW spoke with patient's son, Zollie Beckers, via phone as patient oriented to self only.) Affect (typically observed):  Other (CSW spoke with patient's son, Zollie Beckers, via phone as patient oriented to self only.) Orientation:  Oriented to Self Alcohol / Substance use:  Not Applicable Psych involvement (Current and /or in the community):  No (Comment) (Not appropriate on this admission.)  Discharge Needs  Concerns to be addressed:  No discharge needs identified Readmission within the last 30 days:  No Current discharge risk:  None Barriers to Discharge:  No Barriers Identified   Rod Mae, LCSW 11/25/2014, 12:28 PM [phone]

## 2014-11-25 NOTE — Progress Notes (Signed)
Patient ID: [name] Frank, female   DOB: 1934-07-03, 80 y.o.   MRN: [id number] 3 Days Post-Op  Subjective: Pt feels well.  Tolerating regular diet.  No c/o pain  Objective: Vital signs in last 24 hours: Temp:  [98.6 F (37 C)-99.1 F (37.3 C)] 98.6 F (37 C) (09/12 0500) Pulse Rate:  [86-100] 95 (09/12 0500) Resp:  [16-18] 18 (09/12 0500) BP: (114-130)/(52-64) 126/64 mmHg (09/12 0500) SpO2:  [96 %-100 %] 100 % (09/12 0500) Last BM Date: 11/24/14  Intake/Output from previous day: 09/11 0701 - 09/12 0700 In: 757 [P.O.:360; I.V.:397] Out: 875 [Urine:775; Stool:100] Intake/Output this shift:    PE: Abd: soft, ND, NT, +BS, colostomy working well  Lab Results:   Recent Labs  11/23/14 1150 11/25/14 0348  WBC 17.0* 16.0*  HGB 12.3 9.8*  HCT 36.4 30.5*  PLT 334 306   BMET  Recent Labs  11/23/14 1150 11/25/14 0348  NA 137 137  K 3.7 3.2*  CL 105 105  CO2 23 26  GLUCOSE 203* 85  BUN 9 5*  CREATININE 0.78 0.58  CALCIUM 8.4* 7.8*   PT/INR No results for input(s): LABPROT, INR in the last 72 hours. CMP     Component Value Date/Time   NA 137 11/25/2014 0348   K 3.2* 11/25/2014 0348   CL 105 11/25/2014 0348   CO2 26 11/25/2014 0348   GLUCOSE 85 11/25/2014 0348   BUN 5* 11/25/2014 0348   CREATININE 0.58 11/25/2014 0348   CALCIUM 7.8* 11/25/2014 0348   PROT 6.2* 11/14/2014 1903   ALBUMIN 2.8* 11/14/2014 1903   AST 22 11/14/2014 1903   ALT 10* 11/14/2014 1903   ALKPHOS 57 11/14/2014 1903   BILITOT 0.5 11/14/2014 1903   GFRNONAA >60 11/25/2014 0348   GFRAA >60 11/25/2014 0348   Lipase  No results found for: LIPASE     Studies/Results: Dg Chest Port 1 View  11/24/2014   CLINICAL DATA:  Fever.  EXAM: PORTABLE CHEST - 1 VIEW  COMPARISON:  07/19/2012.  FINDINGS: The heart size and mediastinal contours are within normal limits. Both lungs are clear. The visualized skeletal structures are unremarkable. Thoracic atherosclerosis. Degenerative change thoracic  spine.  IMPRESSION: No active disease.  Stable exam.   Electronically Signed   By: Elsie Stain M.D.   On: 11/24/2014 08:39    Anti-infectives: Anti-infectives    Start     Dose/Rate Route Frequency Ordered Stop   11/24/14 1900  cefTRIAXone (ROCEPHIN) 1 g in dextrose 5 % 50 mL IVPB     1 g 100 mL/hr over 30 Minutes Intravenous Every 24 hours 11/24/14 1814     11/24/14 1600  vancomycin (VANCOCIN) 500 mg in sodium chloride 0.9 % 100 mL IVPB     500 mg 100 mL/hr over 60 Minutes Intravenous Every 12 hours 11/24/14 1539     11/22/14 1335  dextrose 5 % with cefUROXime (ZINACEF) ADS Med    Comments:  Forte, Lindsi   : cabinet override      11/22/14 1335 11/23/14 0144   11/22/14 0600  cefOXitin (MEFOXIN) 1 g in dextrose 5 % 50 mL IVPB  Status:  Discontinued     1 g 100 mL/hr over 30 Minutes Intravenous To Short Stay 11/21/14 0802 11/23/14 0719   11/14/14 2200  amoxicillin-clavulanate (AUGMENTIN) 875-125 MG per tablet 1 tablet  Status:  Discontinued     1 tablet Oral 2 times daily 11/14/14 1737 11/16/14 1746       Assessment/Plan  POD 3, s/p sacral bone bx, debridement of sacral wound, and diverting loop colostomy -doing well, no surgical issues -bone culture prelim shows staph aureus, defer further care to ID and primary service -patient is surgically stable for dc when medically stable. -cont BID dressing changes to wound -routine colostomy care   LOS: 11 days    [name] Frank 11/25/2014, 8:48 AM Pager: [phone]

## 2014-11-25 NOTE — Clinical Social Work Placement (Signed)
   CLINICAL SOCIAL WORK PLACEMENT  NOTE  Date:  11/25/2014  Patient Details  Name: [name] Frank MRN: [id number] Date of Birth: 1934-11-03  Clinical Social Work is seeking post-discharge placement for this patient at the Skilled  Nursing Facility level of care (*CSW will initial, date and re-position this form in  chart as items are completed):  Yes   Patient/family provided with [facility] Clinical Social Work Department's list of facilities offering this level of care within the geographic area requested by the patient (or if unable, by the patient's family).  Yes   Patient/family informed of their freedom to choose among providers that offer the needed level of care, that participate in Medicare, Medicaid or managed care program needed by the patient, have an available bed and are willing to accept the patient.  Yes   Patient/family informed of [facility]'s ownership interest in [facility] and [facility], as well as of the fact that they are under no obligation to receive care at these facilities.  PASRR submitted to EDS on 11/25/14     PASRR number received on 11/25/14     Existing PASRR number confirmed on  (n/a)     FL2 transmitted to all facilities in geographic area requested by pt/family on 11/25/14     FL2 transmitted to all facilities within larger geographic area on  (n/a)     Patient informed that his/her managed care company has contracts with or will negotiate with certain facilities, including the following:   (yes, [facility])         Patient/family informed of bed offers received.  Patient chooses bed at       Physician recommends and patient chooses bed at      Patient to be transferred to   on  .  Patient to be transferred to facility by       Patient family notified on   of transfer.  Name of family member notified:        PHYSICIAN Please sign FL2     Additional Comment:     _______________________________________________ Rod Mae, LCSW 11/25/2014, 12:55 PM

## 2014-11-26 DIAGNOSIS — Z9889 Other specified postprocedural states: Secondary | ICD-10-CM | POA: Diagnosis not present

## 2014-11-26 DIAGNOSIS — M868X8 Other osteomyelitis, other site: Secondary | ICD-10-CM | POA: Diagnosis not present

## 2014-11-26 DIAGNOSIS — L8994 Pressure ulcer of unspecified site, stage 4: Secondary | ICD-10-CM | POA: Diagnosis not present

## 2014-11-26 DIAGNOSIS — M4628 Osteomyelitis of vertebra, sacral and sacrococcygeal region: Secondary | ICD-10-CM

## 2014-11-26 DIAGNOSIS — R2689 Other abnormalities of gait and mobility: Secondary | ICD-10-CM | POA: Diagnosis not present

## 2014-11-26 DIAGNOSIS — T8351XA Infection and inflammatory reaction due to indwelling urinary catheter, initial encounter: Secondary | ICD-10-CM

## 2014-11-26 DIAGNOSIS — F419 Anxiety disorder, unspecified: Secondary | ICD-10-CM | POA: Diagnosis not present

## 2014-11-26 DIAGNOSIS — R41841 Cognitive communication deficit: Secondary | ICD-10-CM | POA: Diagnosis not present

## 2014-11-26 DIAGNOSIS — F0390 Unspecified dementia without behavioral disturbance: Secondary | ICD-10-CM | POA: Diagnosis not present

## 2014-11-26 DIAGNOSIS — Z96649 Presence of unspecified artificial hip joint: Secondary | ICD-10-CM | POA: Diagnosis not present

## 2014-11-26 DIAGNOSIS — E46 Unspecified protein-calorie malnutrition: Secondary | ICD-10-CM | POA: Diagnosis not present

## 2014-11-26 DIAGNOSIS — M6281 Muscle weakness (generalized): Secondary | ICD-10-CM | POA: Diagnosis not present

## 2014-11-26 DIAGNOSIS — B9689 Other specified bacterial agents as the cause of diseases classified elsewhere: Secondary | ICD-10-CM | POA: Diagnosis not present

## 2014-11-26 DIAGNOSIS — Z872 Personal history of diseases of the skin and subcutaneous tissue: Secondary | ICD-10-CM | POA: Diagnosis not present

## 2014-11-26 DIAGNOSIS — R7881 Bacteremia: Secondary | ICD-10-CM | POA: Diagnosis not present

## 2014-11-26 DIAGNOSIS — Z87891 Personal history of nicotine dependence: Secondary | ICD-10-CM | POA: Diagnosis not present

## 2014-11-26 DIAGNOSIS — Z8739 Personal history of other diseases of the musculoskeletal system and connective tissue: Secondary | ICD-10-CM | POA: Diagnosis not present

## 2014-11-26 DIAGNOSIS — R32 Unspecified urinary incontinence: Secondary | ICD-10-CM | POA: Diagnosis not present

## 2014-11-26 DIAGNOSIS — T83511A Infection and inflammatory reaction due to indwelling urethral catheter, initial encounter: Secondary | ICD-10-CM

## 2014-11-26 DIAGNOSIS — Z8614 Personal history of Methicillin resistant Staphylococcus aureus infection: Secondary | ICD-10-CM | POA: Diagnosis not present

## 2014-11-26 DIAGNOSIS — A4901 Methicillin susceptible Staphylococcus aureus infection, unspecified site: Secondary | ICD-10-CM

## 2014-11-26 DIAGNOSIS — F0391 Unspecified dementia with behavioral disturbance: Secondary | ICD-10-CM | POA: Diagnosis not present

## 2014-11-26 DIAGNOSIS — Z960 Presence of urogenital implants: Secondary | ICD-10-CM | POA: Diagnosis not present

## 2014-11-26 DIAGNOSIS — Z933 Colostomy status: Secondary | ICD-10-CM | POA: Diagnosis not present

## 2014-11-26 DIAGNOSIS — R488 Other symbolic dysfunctions: Secondary | ICD-10-CM | POA: Diagnosis not present

## 2014-11-26 DIAGNOSIS — I10 Essential (primary) hypertension: Secondary | ICD-10-CM | POA: Diagnosis not present

## 2014-11-26 DIAGNOSIS — Z8639 Personal history of other endocrine, nutritional and metabolic disease: Secondary | ICD-10-CM | POA: Diagnosis not present

## 2014-11-26 DIAGNOSIS — N390 Urinary tract infection, site not specified: Secondary | ICD-10-CM

## 2014-11-26 DIAGNOSIS — M1990 Unspecified osteoarthritis, unspecified site: Secondary | ICD-10-CM | POA: Diagnosis not present

## 2014-11-26 DIAGNOSIS — Z9181 History of falling: Secondary | ICD-10-CM | POA: Diagnosis not present

## 2014-11-26 DIAGNOSIS — Z5181 Encounter for therapeutic drug level monitoring: Secondary | ICD-10-CM

## 2014-11-26 DIAGNOSIS — B9562 Methicillin resistant Staphylococcus aureus infection as the cause of diseases classified elsewhere: Secondary | ICD-10-CM | POA: Diagnosis not present

## 2014-11-26 DIAGNOSIS — Z438 Encounter for attention to other artificial openings: Secondary | ICD-10-CM | POA: Diagnosis not present

## 2014-11-26 DIAGNOSIS — L89154 Pressure ulcer of sacral region, stage 4: Secondary | ICD-10-CM | POA: Diagnosis not present

## 2014-11-26 DIAGNOSIS — Z8719 Personal history of other diseases of the digestive system: Secondary | ICD-10-CM | POA: Diagnosis not present

## 2014-11-26 DIAGNOSIS — E43 Unspecified severe protein-calorie malnutrition: Secondary | ICD-10-CM | POA: Diagnosis not present

## 2014-11-26 DIAGNOSIS — Z452 Encounter for adjustment and management of vascular access device: Secondary | ICD-10-CM | POA: Diagnosis not present

## 2014-11-26 DIAGNOSIS — Z23 Encounter for immunization: Secondary | ICD-10-CM | POA: Diagnosis not present

## 2014-11-26 DIAGNOSIS — E440 Moderate protein-calorie malnutrition: Secondary | ICD-10-CM | POA: Diagnosis not present

## 2014-11-26 DIAGNOSIS — M869 Osteomyelitis, unspecified: Secondary | ICD-10-CM | POA: Diagnosis not present

## 2014-11-26 DIAGNOSIS — B9561 Methicillin susceptible Staphylococcus aureus infection as the cause of diseases classified elsewhere: Secondary | ICD-10-CM

## 2014-11-26 DIAGNOSIS — Z79899 Other long term (current) drug therapy: Secondary | ICD-10-CM | POA: Diagnosis not present

## 2014-11-26 DIAGNOSIS — R1319 Other dysphagia: Secondary | ICD-10-CM | POA: Diagnosis not present

## 2014-11-26 DIAGNOSIS — M8638 Chronic multifocal osteomyelitis, other site: Secondary | ICD-10-CM | POA: Diagnosis not present

## 2014-11-26 LAB — CBC
HEMATOCRIT: 32.9 % — AB (ref 36.0–46.0)
HEMOGLOBIN: 10.8 g/dL — AB (ref 12.0–15.0)
MCH: 27.3 pg (ref 26.0–34.0)
MCHC: 32.8 g/dL (ref 30.0–36.0)
MCV: 83.3 fL (ref 78.0–100.0)
Platelets: 343 10*3/uL (ref 150–400)
RBC: 3.95 MIL/uL (ref 3.87–5.11)
RDW: 14.3 % (ref 11.5–15.5)
WBC: 15.1 10*3/uL — ABNORMAL HIGH (ref 4.0–10.5)

## 2014-11-26 LAB — BASIC METABOLIC PANEL
ANION GAP: 7 (ref 5–15)
BUN: <5 mg/dL — ABNORMAL LOW (ref 6–20)
CO2: 25 mmol/L (ref 22–32)
Calcium: 8.2 mg/dL — ABNORMAL LOW (ref 8.9–10.3)
Chloride: 105 mmol/L (ref 101–111)
Creatinine, Ser: 0.6 mg/dL (ref 0.44–1.00)
GFR calc Af Amer: >60 mL/min (ref >60)
GFR calc non Af Amer: >60 mL/min (ref >60)
GLUCOSE: 92 mg/dL (ref 65–99)
POTASSIUM: 3.9 mmol/L (ref 3.5–5.1)
Sodium: 137 mmol/L (ref 135–145)

## 2014-11-26 MED ORDER — SACCHAROMYCES BOULARDII 250 MG PO CAPS: 250.0000 mg | ORAL_CAPSULE | Freq: Two times a day (BID) | ORAL | Status: DC

## 2014-11-26 MED ORDER — ZINC SULFATE 220 (50 ZN) MG PO CAPS: 220.0000 mg | ORAL_CAPSULE | Freq: Every day | ORAL | Status: DC

## 2014-11-26 MED ORDER — ASCORBIC ACID 500 MG PO TABS: 500.0000 mg | ORAL_TABLET | Freq: Two times a day (BID) | ORAL | Status: AC

## 2014-11-26 MED ORDER — SODIUM CHLORIDE 0.9 % IJ SOLN: 10.0000 mL | INTRAMUSCULAR | Status: DC | PRN

## 2014-11-26 MED ORDER — BOOST / RESOURCE BREEZE PO LIQD: 1.0000 | Freq: Three times a day (TID) | ORAL | Status: DC

## 2014-11-26 MED ORDER — DEXTROSE 5 % IV SOLN
1.0000 g | Freq: Once | INTRAVENOUS | Status: AC
  Administered 2014-11-26: 1 g via INTRAVENOUS
  Filled 2014-11-26: qty 10

## 2014-11-26 MED ORDER — VANCOMYCIN HCL 500 MG IV SOLR: 500.0000 mg | Freq: Two times a day (BID) | INTRAVENOUS | Status: DC

## 2014-11-26 NOTE — Progress Notes (Signed)
ANTIBIOTIC CONSULT NOTE  Pharmacy Consult for Vancomycin Indication: osteomyelitis  Allergies  Allergen Reactions  . Codeine Sulfate Other (See Comments)    stomach pains  . Tramadol Hcl Other (See Comments)    stomach cramps    Patient Measurements: Height:  (170.2 cm) Weight: 114 lb (51.71 kg) IBW/kg (Calculated) : 61.6  Labs:  Recent Labs  11/25/14 0348 11/26/14 0433  WBC 16.0* 15.1*  HGB 9.8* 10.8*  PLT 306 343  CREATININE 0.58 0.60   Estimated Creatinine Clearance: 48.1 mL/min (by C-G formula based on Cr of 0.6). No results for input(s): VANCOTROUGH, VANCOPEAK, VANCORANDOM, GENTTROUGH, GENTPEAK, GENTRANDOM, TOBRATROUGH, TOBRAPEAK, TOBRARND, AMIKACINPEAK, AMIKACINTROU, AMIKACIN in the last 72 hours.   Microbiology: Recent Results (from the past 720 hour(s))  Wound culture     Status: None   Collection Time: 10/30/14  8:59 AM  Result Value Ref Range Status   Specimen Description ABSCESS  Final   Special Requests NONE  Final   Gram Stain   Final    FEW WBC SEEN MODERATE GRAM NEGATIVE RODS MANY GRAM POSITIVE COCCI IN PAIRS    Culture   Final    LIGHT GROWTH ENTEROCOCCUS AVIUM WITH NORMAL SKIN FLORA    Report Status 11/04/2014 FINAL  Final   Organism ID, Bacteria ENTEROCOCCUS AVIUM  Final      Susceptibility   Enterococcus avium - MIC*    AMPICILLIN <=2 SENSITIVE Sensitive     LINEZOLID Value in next row Sensitive      SENSITIVE2    * LIGHT GROWTH ENTEROCOCCUS AVIUM  Culture, blood (routine x 2)     Status: None   Collection Time: 11/14/14  6:59 PM  Result Value Ref Range Status   Specimen Description BLOOD RIGHT HAND  Final   Special Requests IN PEDIATRIC BOTTLE 1CC  Final   Culture NO GROWTH 5 DAYS  Final   Report Status 11/19/2014 FINAL  Final  Culture, blood (routine x 2)     Status: None   Collection Time: 11/14/14  7:03 PM  Result Value Ref Range Status   Specimen Description BLOOD LEFT HAND  Final   Special Requests BOTTLES DRAWN AEROBIC  AND ANAEROBIC 5CC  Final   Culture NO GROWTH 5 DAYS  Final   Report Status 11/19/2014 FINAL  Final  Surgical pcr screen     Status: None   Collection Time: 11/21/14  5:11 AM  Result Value Ref Range Status   MRSA, PCR NEGATIVE NEGATIVE Final   Staphylococcus aureus NEGATIVE NEGATIVE Final    Comment:        The Xpert SA Assay (FDA approved for NASAL specimens in patients over 58 years of age), is one component of a comprehensive surveillance program.  Test performance has been validated by [facility] for patients greater than or equal to 10 year old. It is not intended to diagnose infection nor to guide or monitor treatment.   Anaerobic culture     Status: None (Preliminary result)   Collection Time: 11/22/14  2:33 PM  Result Value Ref Range Status   Specimen Description TISSUE  Final   Special Requests SACRAL BONE BIOPSY  Final   Gram Stain   Final    NO WBC SEEN NO SQUAMOUS EPITHELIAL CELLS SEEN NO ORGANISMS SEEN Performed at [facility]    Culture   Final    NO ANAEROBES ISOLATED; CULTURE IN PROGRESS FOR 5 DAYS Performed at [facility]    Report Status PENDING  Incomplete  Tissue culture     Status: None   Collection Time: 11/22/14  2:33 PM  Result Value Ref Range Status   Specimen Description TISSUE  Final   Special Requests SACRAL BONE BIOPSY  Final   Gram Stain   Final    NO WBC SEEN NO SQUAMOUS EPITHELIAL CELLS SEEN NO ORGANISMS SEEN Performed at [facility]    Culture   Final    FEW METHICILLIN RESISTANT STAPHYLOCOCCUS AUREUS Note: RIFAMPIN AND GENTAMICIN SHOULD NOT BE USED AS SINGLE DRUGS FOR TREATMENT OF STAPH INFECTIONS. This organism DOES NOT demonstrate inducible Clindamycin resistance in vitro. CRITICAL RESULT CALLED TO, READ BACK BY AND VERIFIED WITH: PATTY MOSS @ 9:30  AM 11/25/14 BY DWEEKS Performed at [facility]    Report Status 11/25/2014 FINAL  Final   Organism ID, Bacteria METHICILLIN RESISTANT  STAPHYLOCOCCUS AUREUS  Final      Susceptibility   Methicillin resistant staphylococcus aureus - MIC*    CLINDAMYCIN <=0.25 SENSITIVE Sensitive     ERYTHROMYCIN >=8 RESISTANT Resistant     GENTAMICIN <=0.5 SENSITIVE Sensitive     LEVOFLOXACIN <=0.12 SENSITIVE Sensitive     OXACILLIN >=4 RESISTANT Resistant     RIFAMPIN <=0.5 SENSITIVE Sensitive     TRIMETH/SULFA <=10 SENSITIVE Sensitive     VANCOMYCIN 1 SENSITIVE Sensitive     TETRACYCLINE <=1 SENSITIVE Sensitive     * FEW METHICILLIN RESISTANT STAPHYLOCOCCUS AUREUS  Culture, blood (routine x 2)     Status: None (Preliminary result)   Collection Time: 11/24/14  8:31 AM  Result Value Ref Range Status   Specimen Description BLOOD LEFT HAND  Final   Special Requests BOTTLES DRAWN AEROBIC AND ANAEROBIC 10CC  Final   Culture NO GROWTH 1 DAY  Final   Report Status PENDING  Incomplete  Culture, blood (routine x 2)     Status: None (Preliminary result)   Collection Time: 11/24/14  8:37 AM  Result Value Ref Range Status   Specimen Description BLOOD RIGHT HAND  Final   Special Requests BOTTLES DRAWN AEROBIC AND ANAEROBIC 10CC  Final   Culture  Setup Time   Final    GRAM POSITIVE COCCI IN CLUSTERS AEROBIC BOTTLE ONLY CRITICAL RESULT CALLED TO, READ BACK BY AND VERIFIED WITH: L BLACKWELL,RN AT 1159 BY L BENFIELD    Culture STAPHYLOCOCCUS AUREUS  Final   Report Status PENDING  Incomplete  Culture, Urine     Status: None (Preliminary result)   Collection Time: 11/24/14 11:27 AM  Result Value Ref Range Status   Specimen Description URINE, CATHETERIZED  Final   Special Requests NONE  Final   Culture 40,000 COLONIES/ml YEAST  Final   Report Status PENDING  Incomplete    Medical History: Past Medical History  Diagnosis Date  . Allergy   . Hyperlipidemia   . Hypertension   . Anxiety   . GERD (gastroesophageal reflux disease)   . Urinary frequency   . Neurogenic urinary incontinence     since back surgery  . Arthritis      degenerative oateoarthritis  . Decubital ulcer 11/14/2014  . Dementia     Assessment: 80 year old female on vancomycin for stage 4 decubitus ulcer with osteomyelitis. Finishing a course of CTX today for UTI. Spoke with Dr. Malachi Bonds, patient is having PICC placed and then is to be discharged to SNF today.  SCr has remained stable, current CrCl ~40-87mL/min.  To be on vancomycin for 6 weeks for MRSA osteo per  Dr. Moshe Cipro note.  Goal of Therapy:  Vancomycin trough level 15-20 mcg/ml  Plan:  -Vancomycin 500mg  IV q12h -recommend a trough be checked this week to ensure patient is achieving adequate levels, then as per facility protocol thereafter  Lakyn Alsteen D. Johanny Segers, PharmD, BCPS Clinical Pharmacist Pager: [phone] 11/26/2014 2:20 PM

## 2014-11-26 NOTE — Clinical Social Work Note (Signed)
Patient to be discharged to [facility]. Patient's husband and daughter-in-law updated.  Facility: [facility] RN report number: [phone] Transportation: EMS  Marcelline Deist, [place] - [phone] Clinical Social Work Department Orthopedics [phone]) and Surgical [phone])

## 2014-11-26 NOTE — Clinical Social Work Placement (Signed)
   CLINICAL SOCIAL WORK PLACEMENT  NOTE  Date:  11/26/2014  Patient Details  Name: [name] [name] MRN: [id number] Date of Birth: Jun 15, 1934  Clinical Social Work is seeking post-discharge placement for this patient at the Skilled  Nursing Facility level of care (*CSW will initial, date and re-position this form in  chart as items are completed):  Yes   Patient/family provided with [facility] Clinical Social Work Department's list of facilities offering this level of care within the geographic area requested by the patient (or if unable, by the patient's family).  Yes   Patient/family informed of their freedom to choose among providers that offer the needed level of care, that participate in Medicare, Medicaid or managed care program needed by the patient, have an available bed and are willing to accept the patient.  Yes   Patient/family informed of 's ownership interest in [facility] and [facility], as well as of the fact that they are under no obligation to receive care at these facilities.  PASRR submitted to EDS on 11/25/14     PASRR number received on 11/25/14     Existing PASRR number confirmed on  (n/a)     FL2 transmitted to all facilities in geographic area requested by pt/family on 11/25/14     FL2 transmitted to all facilities within larger geographic area on  (n/a)     Patient informed that his/her managed care company has contracts with or will negotiate with certain facilities, including the following:   (yes, [facility])     Yes   Patient/family informed of bed offers received.  Patient chooses bed at [facility]     Physician recommends and patient chooses bed at      Patient to be transferred to [facility] on 11/26/14.  Patient to be transferred to facility by [facility]     Patient family notified on 11/26/14 of transfer.  Name of family member notified:  Maryjean Ka     PHYSICIAN       Additional Comment:     _______________________________________________ Rod Mae, LCSW 11/26/2014, 3:38 PM

## 2014-11-26 NOTE — Consult Note (Signed)
See WOC consultation notes from last week and yesterday.  WOC following ostomy, CCS managing wound at this point. Would recommend follow up with CCS or plastic surgery if placement of ACell desired now that patient has diverting ostomy.   Malikah Principato [place] RN,CWOCN [phone]

## 2014-11-26 NOTE — Progress Notes (Signed)
OT Cancellation Note  Patient Details Name: [name] [name] MRN: [id number] DOB: 10/04/1934   Cancelled Treatment:    Reason Eval/Treat Not Completed: Other (comment) (Pt undergoing sterile procedure). Will check back as able.   Kayvan Hoefling , MS, OTR/L, CLT Pager: [phone]  11/26/2014, 2:29 PM

## 2014-11-26 NOTE — Progress Notes (Signed)
Physical Therapy Treatment Patient Details Name: [name] [name] MRN: [id number] DOB: June 03, 1934 Today's Date: 11/26/2014    History of Present Illness Pt admitted with stage 4 sacral decubitus ulcer with osteomyelitis.    PT Comments    Pt demonstrating decreased coordination with transitional movement with supine > sit and sit <> stand. Con't to recommend SNF.  Follow Up Recommendations  SNF;Supervision/Assistance - 24 hour     Equipment Recommendations  Rolling walker with 5" wheels    Recommendations for Other Services       Precautions / Restrictions Precautions Precautions: Fall Precaution Comments: Pressure relief every 30 min when OOB due to Stage IV wound; In bed roll to side every 1-2 hours Restrictions Weight Bearing Restrictions: No    Mobility  Bed Mobility Overal bed mobility: Needs Assistance Bed Mobility: Supine to Sit     Supine to sit: Mod assist     General bed mobility comments: decreased coordination and requires cueing for technique.  Difficulty following instructions for scooting to EOB to decrease sheer force on buttocks.  Transfers Overall transfer level: Needs assistance Equipment used: Rolling walker (2 wheeled) Transfers: Sit to/from Stand Sit to Stand: Mod assist         General transfer comment: Cues for technique   Ambulation/Gait Ambulation/Gait assistance: Min guard Ambulation Distance (Feet): 160 Feet Assistive device: Rolling walker (2 wheeled) Gait Pattern/deviations: Decreased step length - right;Decreased step length - left;Trunk flexed Gait velocity: decreased   General Gait Details: Cues for posture and increased step length   Stairs            Wheelchair Mobility    Modified Rankin (Stroke Patients Only)       Balance Overall balance assessment: Needs assistance Sitting-balance support: Feet supported Sitting balance-Leahy Scale: Fair     Standing balance support: Bilateral upper extremity  supported Standing balance-Leahy Scale: Poor Standing balance comment: requires UE assistance                    Cognition Arousal/Alertness: Awake/alert Behavior During Therapy: WFL for tasks assessed/performed Overall Cognitive Status: History of cognitive impairments - at baseline       Memory: Decreased short-term memory              Exercises General Exercises - Lower Extremity Ankle Circles/Pumps: AROM;Both;10 reps;Seated    General Comments        Pertinent Vitals/Pain Pain Assessment: No/denies pain    Home Living                      Prior Function            PT Goals (current goals can now be found in the care plan section) Acute Rehab PT Goals Patient Stated Goal: get better PT Goal Formulation: With family Time For Goal Achievement: 11/29/14 Potential to Achieve Goals: Fair Progress towards PT goals: Progressing toward goals    Frequency  Min 3X/week    PT Plan Current plan remains appropriate    Co-evaluation             End of Session Equipment Utilized During Treatment: Gait belt Activity Tolerance: Patient tolerated treatment well Patient left: in chair;with call bell/phone within reach;with family/visitor present;with chair alarm set     Time: 1191-4782 PT Time Calculation (min) (ACUTE ONLY): 26 min  Charges:  $Gait Training: 8-22 mins $Therapeutic Activity: 8-22 mins  G Codes:      Kyanne Rials LUBECK 11/26/2014, 10:16 AM

## 2014-11-26 NOTE — Discharge Instructions (Signed)
Colostomy Home Guide °A colostomy is an opening for stool to leave your body when a medical condition prevents it from leaving through the usual opening (rectum). During a surgery, a piece of large intestine (colon) is brought through a hole in the abdominal wall. The new opening is called a stoma or ostomy. A bag or pouch fits over the stoma to catch stool and gas. Your stool may be liquid, somewhat pasty, or formed. °CARING FOR YOUR STOMA  °Normally, the stoma looks a lot like the inside of your cheek: pink, red, and moist. At first it may be swollen, but this swelling will decrease within 6 weeks. °Keep the skin around your stoma clean and dry. You can gently wash your stoma and the skin around your stoma in the shower with a clean, soft washcloth. If you develop any skin irritation, your caregiver may give you a stoma powder or ointment to help heal the area. Do not use any products other than those specifically given to you by your caregiver.  °Your stoma should not be uncomfortable. If you notice any stinging or burning, your pouch may be leaking, and the skin around your stoma may be coming into contact with stool. This can cause skin irritation. If you notice stinging, replace your pouch with a new one and discard the old one. °OSTOMY POUCHES  °The pouch that fits over the ostomy can be made up of either 1 or 2 pieces. A one-piece pouch has a skin barrier piece and the pouch itself in one unit. A two-piece pouch has a skin barrier with a separate pouch that snaps on and off of the skin barrier. Either way, you should empty the pouch when it is only  to ½ full. Do not let more stool or gas build up. This could cause the pouch to leak. °Some ostomy bags have a built-in gas release valve. Ostomy deodorizer (5 drops) can be put into the pouch to prevent odor. Some people use ostomy lubricant drops inside the pouch to help the stool slide out of the bag more easily and completely.  °EMPTYING YOUR OSTOMY POUCH    °You may get lessons on how to empty your pouch from a wound-ostomy nurse before you leave the hospital. Here are the basic steps: °· Wash your hands with soap and water. °· Sit far back on the toilet. °· Put several pieces of toilet paper into the toilet water. This will prevent splashing as you empty the stool into the toilet bowl. °· Unclip or unvelcro the tail end of the pouch. °· Unroll the tail and empty stool into the toilet. °· Clean the tail with toilet paper. °· Reroll the tail, and clip or velcro it closed. °· Wash your hands again. °CHANGING YOUR OSTOMY POUCH  °Change your ostomy pouch about every 3 to 4 days for the first 6 weeks, then every 5 to7 days. Always change the bag sooner if there is any leakage or you begin to notice any discomfort or irritation of the skin around the stoma. When possible, plan to change your ostomy pouch before eating or drinking as this will lessen the chance of stool coming out during the pouch change. A wound-ostomy nurse may teach you how to change your pouch before you leave the hospital. Here are the basic steps: °· Lay out your supplies. °· Wash your hands with soap and water. °· Carefully remove the old pouch. °· Wash the stoma and allow it to dry. Men may be   advised to shave any hair around the stoma very carefully. This will make the adhesive stick better. °· Use the stoma measuring guide that comes with your pouch set to decide what size hole you will need to cut in the skin barrier piece. Choose the smallest possible size that will hold the stoma but will not touch it. °· Use the guide to trace the circle on the back of the skin barrier piece. Cut out the hole. °· Hold the skin barrier piece over the stoma to make sure the hole is the correct size. °· Remove the adhesive paper backing from the skin barrier piece. °· Squeeze stoma paste around the opening of the skin barrier piece. °· Clean and dry the skin around the stoma again. °· Carefully fit the skin  barrier piece over your stoma. °· If you are using a two-piece pouch, snap the pouch onto the skin barrier piece. °· Close the tail of the pouch. °· Put your hand over the top of the skin barrier piece to help warm it for about 5 minutes, so that it conforms to your body better. °· Wash your hands again. °DIET TIPS  °· Continue to follow your usual diet. °· Drink about eight 8 oz glasses of water each day. °· You can prevent gas by eating slowly and chewing your food thoroughly. °· If you feel concerned that you have too much gas, you can cut back on gas-producing foods, such as: °¨ Spicy foods. °¨ Onions and garlic. °¨ Cruciferous vegetables (cabbage, broccoli, cauliflower, Brussels sprouts). °¨ Beans and legumes. °¨ Some cheeses. °¨ Eggs. °¨ Fish. °¨ Bubbly (carbonated) drinks. °¨ Chewing gum. °GENERAL TIPS  °· You can shower with or without the bag in place. °· Always keep the bag on if you are bathing or swimming. °· If your bag gets wet, you can dry it with a blow-dryer set to cool. °· Avoid wearing tight clothing directly over your stoma so that it does not become irritated or bleed. Tight clothing can also prevent stool from draining into the pouch. °· It is helpful to always have an extra skin barrier and pouch with you when traveling. Do not leave them anywhere too warm, as parts of them can melt. °· Do not let your seat belt rest on your stoma. Try to keep the seat belt either above or below your stoma, or use a tiny pillow to cushion it. °· You can still participate in sports, but you should avoid activities in which there is a risk of getting hit in the abdomen. °· You can still have sex. It is a good idea to empty your pouch prior to sex. Some people and their partners feel very comfortable seeing the pouch during sex. Others choose to wear lingerie or a T-shirt that covers the device. °SEEK IMMEDIATE MEDICAL CARE IF: °· You notice a change in the size or color of the stoma, especially if it becomes  very red, purple, black, or pale white. °· You have bloody stools or bleeding from the stoma. °· You have abdominal pain, nausea, vomiting, or bloating. °· There is anything unusual protruding from the stoma. °· You have irritation or red skin around the stoma. °· No stool is passing from the stoma. °· You have diarrhea (requiring more frequent than normal pouch emptying). °Document Released: 03/04/2003 Document Revised: 05/24/2011 Document Reviewed: 07/29/2010 °[employer]® Patient Information ©2015 [employer]. This information is not intended to replace advice given to you by your health care   provider. Make sure you discuss any questions you have with your health care provider.  Dressing Change twice a day to sacral wound A dressing is a material placed over wounds. It keeps the wound clean, dry, and protected from further injury. This provides an environment that favors wound healing.  BEFORE YOU BEGIN  Get your supplies together. Things you may need include:  Saline solution.  Flexible gauze dressing.  Medicated cream.  Tape.  Gloves.  Abdominal dressing pads.  Gauze squares.  Plastic bags.  Take pain medicine 30 minutes before the dressing change if you need it.  Take a shower before you do the first dressing change of the day. Use plastic wrap or a plastic bag to prevent the dressing from getting wet. REMOVING YOUR OLD DRESSING   Wash your hands with soap and water. Dry your hands with a clean towel.  Put on your gloves.  Remove any tape.  Carefully remove the old dressing. If the dressing sticks, you may dampen it with warm water to loosen it, or follow your caregiver's specific directions.  Remove any gauze or packing tape that is in your wound.  Take off your gloves.  Put the gloves, tape, gauze, or any packing tape into a plastic bag. CHANGING YOUR DRESSING  Open the supplies.  Take the cap off the saline solution.  Open the gauze package so that the gauze  remains on the inside of the package.  Put on your gloves.  Clean your wound as told by your caregiver.  If you have been told to keep your wound dry, follow those instructions.  Your caregiver may tell you to do one or more of the following:  Pick up the gauze. Pour the saline solution over the gauze. Squeeze out the extra saline solution.  Put medicated cream or other medicine on your wound if you have been told to do so.  Put the solution soaked gauze only in your wound, not on the skin around it.  Pack your wound loosely or as told by your caregiver.  Put dry gauze on your wound.  Put abdominal dressing pads over the dry gauze if your wet gauze soaks through.  Tape the abdominal dressing pads in place so they will not fall off. Do not wrap the tape completely around the affected part (arm, leg, abdomen).  Wrap the dressing pads with a flexible gauze dressing to secure it in place.  Take off your gloves. Put them in the plastic bag with the old dressing. Tie the bag shut and throw it away.  Keep the dressing clean and dry until your next dressing change.  Wash your hands. SEEK MEDICAL CARE IF:  Your skin around the wound looks red.  Your wound feels more tender or sore.  You see pus in the wound.  Your wound smells bad.  You have a fever.  Your skin around the wound has a rash that itches and burns.  You see black or yellow skin in your wound that was not there before.  You feel nauseous, throw up, and feel very tired. Document Released: 04/08/2004 Document Revised: 05/24/2011 Document Reviewed: 01/11/2011 [facility] Patient Information 2015 [place], [place]. This information is not intended to replace advice given to you by your health care provider. Make sure you discuss any questions you have with your health care provider.  Dressing Change twice a day A dressing is a material placed over wounds. It keeps the wound clean, dry, and protected from further injury.  This provides an environment that favors wound healing.  BEFORE YOU BEGIN  Get your supplies together. Things you may need include:  Saline solution.  Flexible gauze dressing.  Medicated cream.  Tape.  Gloves.  Abdominal dressing pads.  Gauze squares.  Plastic bags.  Take pain medicine 30 minutes before the dressing change if you need it.  Take a shower before you do the first dressing change of the day. Use plastic wrap or a plastic bag to prevent the dressing from getting wet. REMOVING YOUR OLD DRESSING   Wash your hands with soap and water. Dry your hands with a clean towel.  Put on your gloves.  Remove any tape.  Carefully remove the old dressing. If the dressing sticks, you may dampen it with warm water to loosen it, or follow your caregiver's specific directions.  Remove any gauze or packing tape that is in your wound.  Take off your gloves.  Put the gloves, tape, gauze, or any packing tape into a plastic bag. CHANGING YOUR DRESSING  Open the supplies.  Take the cap off the saline solution.  Open the gauze package so that the gauze remains on the inside of the package.  Put on your gloves.  Clean your wound as told by your caregiver.  If you have been told to keep your wound dry, follow those instructions.  Your caregiver may tell you to do one or more of the following:  Pick up the gauze. Pour the saline solution over the gauze. Squeeze out the extra saline solution.  Put medicated cream or other medicine on your wound if you have been told to do so.  Put the solution soaked gauze only in your wound, not on the skin around it.  Pack your wound loosely or as told by your caregiver.  Put dry gauze on your wound.  Put abdominal dressing pads over the dry gauze if your wet gauze soaks through.  Tape the abdominal dressing pads in place so they will not fall off. Do not wrap the tape completely around the affected part (arm, leg, abdomen).  Wrap  the dressing pads with a flexible gauze dressing to secure it in place.  Take off your gloves. Put them in the plastic bag with the old dressing. Tie the bag shut and throw it away.  Keep the dressing clean and dry until your next dressing change.  Wash your hands. SEEK MEDICAL CARE IF:  Your skin around the wound looks red.  Your wound feels more tender or sore.  You see pus in the wound.  Your wound smells bad.  You have a fever.  Your skin around the wound has a rash that itches and burns.  You see black or yellow skin in your wound that was not there before.  You feel nauseous, throw up, and feel very tired. Document Released: 04/08/2004 Document Revised: 05/24/2011 Document Reviewed: 01/11/2011 [facility] Patient Information 2015 [place], [place]. This information is not intended to replace advice given to you by your health care provider. Make sure you discuss any questions you have with your health care provider.

## 2014-11-26 NOTE — Discharge Summary (Addendum)
Physician Discharge Summary  [name] [name] WUJ:[id number] DOB: 1934/07/25 DOA: 11/14/2014  PCP: Kristian Covey, MD  Admit date: 11/14/2014 Discharge date: 11/26/2014  Recommendations for Outpatient Follow-up:  1. Continue vancomycin through October 23, then stop 2. Follow-up with infectious disease in approximately 6 weeks 3. Follow-up pending blood culture from 9/12 and urine culture from 9/11 4. Colostomy:  2" barrier rings cut in 1/2 to cover rod and create flat surface over rod at 3 and 9 o'clock.  2pc 2 3/4".   5. NS wet to dry dressing to sacral wound BID 6. Air mattress if available and pillow under bottom if sitting in chair to offload sacrum.  Ambulate frequently.  If in bed, please encourage left lateral or right lateral decubitus positioning.  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 Active Problems:   Essential hypertension   Osteoarthritis   Protein-calorie malnutrition   Decubitus ulcer of sacral region, stage 4   Stage IV decubitus ulcer   Protein-calorie malnutrition, severe   Osteomyelitis, pelvic region and thigh   Goals of care, counseling/discussion   Enterococcus faecalis infection   Palliative care encounter   DNR (do not resuscitate) discussion   Postprocedural fever   Bacteremia   Catheter-associated urinary tract infection   Positive blood culture   Discharge Condition: Stable, improved  Diet recommendation: Regular  Wt Readings from Last 3 Encounters:  11/16/14 51.71 kg (114 lb)  11/14/14 51.71 kg (114 lb)  10/28/14 53.071 kg (117 lb)    History of present illness:  Patient is a 80 year old African-American female with history of dementia and limited mobility spending much of her time in chair with history of reduced sensation in pelvic area due to prior back surgery. Subsequently developed stage IV decubitus ulcer. Cultures done at [facility] on 10/30/2014 grew enterococcus species  which was susceptible to ampicillin and patient was started on Augmentin therapy. Was transferred here at the request of her primary care physician for further evaluation of sacral decubitus ulcer  9/9: Bone biopsy and diverting colostomy placement.  Culture ultimately grew MRSA 9/11: Developed low-grade fever, blood cultures from this day 1 out of 2 growing gram-positive cocci, urinalysis concerning for UTI. Started vancomycin and ceftriaxone 9/12: Repeat blood culture pending 9/13: Completed third dose of ceftriaxone for cystitis. PICC line placed  Hospital Course:   Decubitus ulcer of sacral region, stage 4 with MRSA osteomyelitis.  She was seen by infectious disease who recommended a plastic surgery consultation for possible bone biopsy which was performed on 9/9. Plastic surgery recommended a diverting colostomy to prevent contamination of the ulcer but stool.  It would have been difficult for this ulcer to heal given the patient's lack of sensation and frequent contamination with bacteria.  She underwent diverting colostomy on 9/9 by general surgery. She may still have a difficult time healing this stage IV ulcer secondary to her severe protein calorie malnutrition. She should continue dressings to her sacral ulcer.  We attempted to place a Foley catheter, however she developed a catheter associated urinary tract infection within a day of having catheter placed. Given the risk of infection, we would recommend against Foley catheter placement for now. She is incontinent of urine.  She had a PICC line placed on 9/13 and will continue vancomycin for a 6 week course, first date of antibiotics to be considered 9/12 which is the date of her first probably negative blood culture.  She will need follow-up with general surgery in  one to 2 weeks for colostomy evaluation.  Follow-up with infectious disease in approximately 6 weeks for further management of osteomyelitis.   She had 1 out of 2 blood cultures  from 9/11 growing gram-positive cocci. This could represent bacteremia with MRSA since the patient has known MRSA osteomyelitis, however it could also represent skin contamination. Please follow-up on the results of her blood culture from 9/11 as well as the follow-up blood culture that was obtained on 9/12.  Fever,  occurred on 9/11, could be related to osteomyelitis, bacteremia, or catheter associated urinary tract infection not present at the time of admission. Her chest x-ray demonstrated no infiltrate. Her urinalysis had too numerous to count white blood cells. This was in the setting of a urinary catheter which was placed during his hospitalization. She was started on vancomycin and ceftriaxone, her urinary catheter was removed, and her fever trended down.  She may have endocarditis, however she is not a candidate for surgery given her severe malnutrition, and she will be completing a six-week course of antibiotics anyway for osteomyelitis.  Defer further workup and management to infectious disease.   Essential hypertension, blood pressures low normal after surgery.   Temporarily held her losartan but resumed as well as her ACEI.  Continue to hold her CCB.    Osteoarthritis, Stable.  Severe protein-calorie malnutrition,  she was given a regular diet and seen by the dietitian who recommended supplements.   Petechial rash,  I suspect that this report represented a vasculitis which may have been related to bacteremia from either endocarditis or osteomyelitis.  She was on antibiotics at the time and amlodipine. After these medications were discontinued, her rash faded.   Leukocytosis, likely due to recent surgery and bone biopsy and was trending down at the time of discharge.   Consultants:  ID Dr. Ninetta Lights  Plastic surgery, Dr. Kelly Splinter  General Surgery, Dr. Janee Morn  Procedures:  CT abdomen and pelvis 9/2  MR pelvis 9/2  Antibiotics:  Augmentin 9/1 > 9/3  Vancomycin 9/11  >  Ceftriaxone 9/11 > 9/13  Discharge Exam: Filed Vitals:   11/26/14 0414  BP: 137/86  Pulse: 93  Temp: 98.5 F (36.9 C)  Resp: 18   Filed Vitals:   11/25/14 0500 11/25/14 1427 11/25/14 2233 11/26/14 0414  BP: 126/64 139/56 127/69 137/86  Pulse: 95 107 95 93  Temp: 98.6 F (37 C) 97.4 F (36.3 C) 99.4 F (37.4 C) 98.5 F (36.9 C)  TempSrc: Oral Oral Oral Oral  Resp: 18 18 18 18   Height:      Weight:      SpO2: 100% 100% 100% 100%   GEN:  Frail female, lying in bed, NAD, confused and asking for her dad HEENT:  NCAT, MMM CV:  RRR, 3/6 systolic murmur PULM:  CTAB ABD:  NABS, soft, colostomy bag full of air, no surrounding erythema or indration and bag is not leaking MSK:  1+ pitting bilateral LEE.    Discharge Instructions      Discharge Instructions    Call MD for:  difficulty breathing, headache or visual disturbances    Complete by:  As directed      Call MD for:  extreme fatigue    Complete by:  As directed      Call MD for:  hives    Complete by:  As directed      Call MD for:  persistant dizziness or light-headedness    Complete by:  As directed  Call MD for:  persistant nausea and vomiting    Complete by:  As directed      Call MD for:  redness, tenderness, or signs of infection (pain, swelling, redness, odor or green/yellow discharge around incision site)    Complete by:  As directed      Call MD for:  severe uncontrolled pain    Complete by:  As directed      Call MD for:  temperature >100.4    Complete by:  As directed      Diet general    Complete by:  As directed      Increase activity slowly    Complete by:  As directed             Medication List    STOP taking these medications        amLODipine 5 MG tablet  Commonly known as:  NORVASC     amoxicillin-clavulanate 875-125 MG per tablet  Commonly known as:  AUGMENTIN     oxyCODONE-acetaminophen 5-325 MG per tablet  Commonly known as:  PERCOCET/ROXICET      TAKE these  medications        ascorbic acid 500 MG tablet  Commonly known as:  VITAMIN C  Take 1 tablet (500 mg total) by mouth 2 (two) times daily.     donepezil 10 MG tablet  Commonly known as:  ARICEPT  Take 1 tablet (10 mg total) by mouth at bedtime.     feeding supplement Liqd  Take 1 Container by mouth 3 (three) times daily between meals.     furosemide 20 MG tablet  Commonly known as:  LASIX  TAKE 1 TABLET (20 MG TOTAL) BY MOUTH DAILY.     losartan 50 MG tablet  Commonly known as:  COZAAR  TAKE 1 TABLET BY MOUTH DAILY     megestrol 40 MG/ML suspension  Commonly known as:  MEGACE ORAL  One tsp po bid     saccharomyces boulardii 250 MG capsule  Commonly known as:  FLORASTOR  Take 1 capsule (250 mg total) by mouth 2 (two) times daily.     vancomycin 500 mg in sodium chloride 0.9 % 100 mL  Inject 500 mg into the vein every 12 (twelve) hours.     vitamin B-12 500 MCG tablet  Commonly known as:  CYANOCOBALAMIN  Take 500 mcg by mouth daily.     zinc sulfate 220 MG capsule  Take 1 capsule (220 mg total) by mouth daily.       Follow-up Information    Follow up with Liz Malady, MD.   Specialty:  General Surgery   Why:  As needed   Contact information:   [address] [place] [address] [phone]       Schedule an appointment as soon as possible for a visit with Kristian Covey, MD.   Specialty:  Family Medicine   Why:  As needed   Contact information:   [address] [place] [address] [phone]       Follow up with Johny Sax, MD. Schedule an appointment as soon as possible for a visit in 6 weeks.   Specialty:  Infectious Diseases   Contact information:   [address] [place] [address] [phone]        The results of significant diagnostics from this hospitalization (including imaging, microbiology, ancillary and laboratory) are listed below for reference.    Significant Diagnostic  Studies: Dg  Sacrum/coccyx  11/06/2014   CLINICAL DATA:  Sacrococcygeal pain for 1 month due to fall. Initial encounter.  EXAM: SACRUM AND COCCYX - 2+ VIEW  COMPARISON:  CT scan of September 18, 2012.  FINDINGS: There appears to be destruction of the distal sacrum and coccyx which may be due to infection. Soft tissue defect is seen overlying this area suggesting sacral decubitus ulcer. Sacroiliac joints appear normal. Status post right total hip arthroplasty. Severe degenerative joint disease of left hip is noted.  IMPRESSION: Probable destruction is seen involving the distal sacrum and coccyx which may be due to infection. Probable overlying soft tissue wound is noted. CT scan or MRI scan may be performed for further evaluation.   Electronically Signed   By: Lupita Raider, M.D.   On: 11/06/2014 13:07   Mr Pelvis W Wo Contrast  11/16/2014   CLINICAL DATA:  Stage IV decubitus ulcer, osteomyelitis assessment.  EXAM: MRI PELVIS WITHOUT AND WITH CONTRAST  TECHNIQUE: Multiplanar multisequence MR imaging of the pelvis was performed both before and after administration of intravenous contrast.  CONTRAST:  10mL MULTIHANCE GADOBENATE DIMEGLUMINE 529 MG/ML IV SOLN  COMPARISON:  11/15/2014 CT scan  FINDINGS: Right paracentral decubitus ulcer overlies the coccyx and lower sacrum, and extends all the way down to the bone in this vicinity. Low-level adjacent marrow enhancement throughout the coccyx is present compatible with osteomyelitis but without bony destruction. No definite involvement of the sacrum.  Low-level presacral edema is present. No discrete presacral abscess.  No separate drainable abscess/ fluid pocket identified. There is diffuse subcutaneous edema along the buttock region.  Sacroiliac joints unremarkable. Right hip implant, with metal artifact causing poor fat saturation. Severe degenerative arthropathy of the left hip.  Lower lumbar spondylosis and degenerative disc disease. Air- fluid levels in the distal colon raising the  possibility of diarrheal process. Unusually high T1 signal in the distal colonic contents.  IMPRESSION: 1. Osteomyelitis of the coccygeal segments. The sacral segments appear spared. Overlying decubitus ulcer extends from the cutaneous surface to the coccyx. 2. Frothy fluid in the distal colon, possibly from diarrheal process. This has an unusual appearance with high T1 signal. Although conceivably dietary, high T1 signal can be caused by blood products or proteinaceous fluid, and assessment of possible protein malabsorption as a cause for the patient's current protein calorie malnutrition might be considered.   Electronically Signed   By: Gaylyn Rong M.D.   On: 11/16/2014 09:25   Ct Abdomen Pelvis W Contrast  11/15/2014   CLINICAL DATA:  Stage IV decubitus wound. Appendectomy. Cholecystectomy. Hysterectomy.  EXAM: CT ABDOMEN AND PELVIS WITH CONTRAST  TECHNIQUE: Multidetector CT imaging of the abdomen and pelvis was performed using the standard protocol following bolus administration of intravenous contrast.  CONTRAST:  OMNIPAQUE IOHEXOL 300 MG/ML  SOLN  COMPARISON:  09/18/2012 and the plain films of 11/06/2014.  FINDINGS: Lower chest: Motion degradation involving the lung bases. Clear lung bases. Normal heart size without pericardial or pleural effusion.  Hepatobiliary: Minimal exclusion of hepatic dome. Scattered hepatic cysts. Cholecystectomy, without biliary ductal dilatation.  Pancreas: Pancreatic duct upper normal, similar. No obstructive mass or evidence of acute pancreatitis.  Spleen: Normal  Adrenals/Urinary Tract: Normal right adrenal gland. Left adrenal thickening is mild. Bilateral renal cysts. Suspect a lower pole right renal collecting system stone or stones. Too small to characterize lesions in both kidneys. No hydronephrosis. Degraded evaluation of the pelvis, secondary to beam hardening artifact from right hip arthroplasty. Grossly  normal urinary bladder.  Stomach/Bowel: Proximal  gastric underdistention. Large colonic stool burden. Bowel not well opacified with enteric contrast. No small bowel distension.  Vascular/Lymphatic: Advanced aortic and branch vessel atherosclerosis. No abdominal and no gross pelvic adenopathy.  Reproductive: Hysterectomy.  No adnexal mass.  Other: No significant free fluid.  Moderate pelvic floor laxity.  Musculoskeletal: Decubitus ulcer, with soft tissue thickening and gas superficial to the coccyx, including on image 67. No gross osseous destruction identified. Osteopenia. Left hip osteoarthritis. Right hip arthroplasty.  IMPRESSION: 1. Decubitus ulcer superficial to the coccyx. No gross underlying osseous destruction. Given the depth of decubitus ulcer, osteomyelitis remains a concern. The test of choice for evaluating is pre and post-contrast pelvic MRI. 2.  No acute process in the abdomen or pelvis. 3.  Possible constipation. 4. Motion and beam hardening artifact degradation. 5. Advanced atherosclerosis. 6. Possible right nephrolithiasis.   Electronically Signed   By: Jeronimo Greaves M.D.   On: 11/15/2014 08:24   Dg Chest Port 1 View  11/24/2014   CLINICAL DATA:  Fever.  EXAM: PORTABLE CHEST - 1 VIEW  COMPARISON:  07/19/2012.  FINDINGS: The heart size and mediastinal contours are within normal limits. Both lungs are clear. The visualized skeletal structures are unremarkable. Thoracic atherosclerosis. Degenerative change thoracic spine.  IMPRESSION: No active disease.  Stable exam.   Electronically Signed   By: Elsie Stain M.D.   On: 11/24/2014 08:39    Microbiology: Recent Results (from the past 240 hour(s))  Surgical pcr screen     Status: None   Collection Time: 11/21/14  5:11 AM  Result Value Ref Range Status   MRSA, PCR NEGATIVE NEGATIVE Final   Staphylococcus aureus NEGATIVE NEGATIVE Final    Comment:        The Xpert SA Assay (FDA approved for NASAL specimens in patients over 33 years of age), is one component of a comprehensive  surveillance program.  Test performance has been validated by [facility] for patients greater than or equal to 64 year old. It is not intended to diagnose infection nor to guide or monitor treatment.   Anaerobic culture     Status: None (Preliminary result)   Collection Time: 11/22/14  2:33 PM  Result Value Ref Range Status   Specimen Description TISSUE  Final   Special Requests SACRAL BONE BIOPSY  Final   Gram Stain   Final    NO WBC SEEN NO SQUAMOUS EPITHELIAL CELLS SEEN NO ORGANISMS SEEN Performed at [facility]    Culture   Final    NO ANAEROBES ISOLATED; CULTURE IN PROGRESS FOR 5 DAYS Performed at [facility]    Report Status PENDING  Incomplete  Tissue culture     Status: None   Collection Time: 11/22/14  2:33 PM  Result Value Ref Range Status   Specimen Description TISSUE  Final   Special Requests SACRAL BONE BIOPSY  Final   Gram Stain   Final    NO WBC SEEN NO SQUAMOUS EPITHELIAL CELLS SEEN NO ORGANISMS SEEN Performed at [facility]    Culture   Final    FEW METHICILLIN RESISTANT STAPHYLOCOCCUS AUREUS Note: RIFAMPIN AND GENTAMICIN SHOULD NOT BE USED AS SINGLE DRUGS FOR TREATMENT OF STAPH INFECTIONS. This organism DOES NOT demonstrate inducible Clindamycin resistance in vitro. CRITICAL RESULT CALLED TO, READ BACK BY AND VERIFIED WITH: PATTY MOSS @ 9:30  AM 11/25/14 BY DWEEKS Performed at [facility]    Report Status 11/25/2014 FINAL  Final   Organism ID, Bacteria METHICILLIN RESISTANT STAPHYLOCOCCUS AUREUS  Final      Susceptibility   Methicillin resistant staphylococcus aureus - MIC*    CLINDAMYCIN <=0.25 SENSITIVE Sensitive     ERYTHROMYCIN >=8 RESISTANT Resistant     GENTAMICIN <=0.5 SENSITIVE Sensitive     LEVOFLOXACIN <=0.12 SENSITIVE Sensitive     OXACILLIN >=4 RESISTANT Resistant     RIFAMPIN <=0.5 SENSITIVE Sensitive     TRIMETH/SULFA <=10 SENSITIVE Sensitive     VANCOMYCIN 1 SENSITIVE Sensitive     TETRACYCLINE  <=1 SENSITIVE Sensitive     * FEW METHICILLIN RESISTANT STAPHYLOCOCCUS AUREUS  Culture, blood (routine x 2)     Status: None (Preliminary result)   Collection Time: 11/24/14  8:31 AM  Result Value Ref Range Status   Specimen Description BLOOD LEFT HAND  Final   Special Requests BOTTLES DRAWN AEROBIC AND ANAEROBIC 10CC  Final   Culture NO GROWTH 2 DAYS  Final   Report Status PENDING  Incomplete  Culture, blood (routine x 2)     Status: None (Preliminary result)   Collection Time: 11/24/14  8:37 AM  Result Value Ref Range Status   Specimen Description BLOOD RIGHT HAND  Final   Special Requests BOTTLES DRAWN AEROBIC AND ANAEROBIC 10CC  Final   Culture  Setup Time   Final    GRAM POSITIVE COCCI IN CLUSTERS AEROBIC BOTTLE ONLY CRITICAL RESULT CALLED TO, READ BACK BY AND VERIFIED WITH: L BLACKWELL,RN AT 1159 BY L BENFIELD    Culture STAPHYLOCOCCUS AUREUS  Final   Report Status PENDING  Incomplete  Culture, Urine     Status: None (Preliminary result)   Collection Time: 11/24/14 11:27 AM  Result Value Ref Range Status   Specimen Description URINE, CATHETERIZED  Final   Special Requests NONE  Final   Culture 40,000 COLONIES/ml YEAST  Final   Report Status PENDING  Incomplete  Culture, blood (single)     Status: None (Preliminary result)   Collection Time: 11/25/14  3:26 PM  Result Value Ref Range Status   Specimen Description BLOOD RIGHT FOREARM  Final   Special Requests   Final    BOTTLES DRAWN AEROBIC AND ANAEROBIC  10CC BLUE TOP AND 3CC IN RED TOP   Culture NO GROWTH < 24 HOURS  Final   Report Status PENDING  Incomplete     Labs: Basic Metabolic Panel:  Recent Labs Lab 11/21/14 0445 11/23/14 1150 11/25/14 0348 11/26/14 0433  NA  --  137 137 137  K  --  3.7 3.2* 3.9  CL  --  105 105 105  CO2  --  23 26 25   GLUCOSE  --  203* 85 92  BUN  --  9 5* <5*  CREATININE 0.69 0.78 0.58 0.60  CALCIUM  --  8.4* 7.8* 8.2*   Liver Function Tests: No results for input(s): AST, ALT,  ALKPHOS, BILITOT, PROT, ALBUMIN in the last 168 hours. No results for input(s): LIPASE, AMYLASE in the last 168 hours. No results for input(s): AMMONIA in the last 168 hours. CBC:  Recent Labs Lab 11/23/14 1150 11/25/14 0348 11/26/14 0433  WBC 17.0* 16.0* 15.1*  HGB 12.3 9.8* 10.8*  HCT 36.4 30.5* 32.9*  MCV 82.9 84.0 83.3  PLT 334 306 343   Cardiac Enzymes: No results for input(s): CKTOTAL, CKMB, CKMBINDEX, TROPONINI in the last 168 hours. BNP: BNP (last 3 results) No results for input(s): BNP in the last 8760 hours.  ProBNP (last 3  results)  Recent Labs  09/03/14 0925  PROBNP 37.0    CBG: No results for input(s): GLUCAP in the last 168 hours.  Time coordinating discharge: 35 minutes  Signed:  Faithlyn Recktenwald  [facility] 11/26/2014, 3:07 PM

## 2014-11-26 NOTE — Progress Notes (Signed)
Peripherally Inserted Central Catheter/Midline Placement  The IV Nurse has discussed with the patient and/or persons authorized to consent for the patient, the purpose of this procedure and the potential benefits and risks involved with this procedure.  The benefits include less needle sticks, lab draws from the catheter and patient may be discharged home with the catheter.  Risks include, but not limited to, infection, bleeding, blood clot (thrombus formation), and puncture of an artery; nerve damage and irregular heat beat.  Alternatives to this procedure were also discussed.  PICC/Midline Placement Documentation  PICC / Midline Single Lumen 11/26/14 PICC Right Basilic 45 cm 0 cm (Active)       Dewain Penning M 11/26/2014, 3:31 PM

## 2014-11-26 NOTE — Progress Notes (Signed)
Report called and given to [facility].  IV dc'd.

## 2014-11-26 NOTE — Progress Notes (Signed)
INFECTIOUS DISEASE PROGRESS NOTE  ID: [name] [name] is a 80 y.o. female with  Principal Problem:   Decubitus ulcer of sacral region, stage 4 Active Problems:   Essential hypertension   Osteoarthritis   Protein-calorie malnutrition   Stage IV decubitus ulcer   Protein-calorie malnutrition, severe   Osteomyelitis, pelvic region and thigh   Goals of care, counseling/discussion   Enterococcus faecalis infection   Palliative care encounter   DNR (do not resuscitate) discussion   Postprocedural fever   UTI (urinary tract infection)   Osteomyelitis due to staphylococcus aureus   Bacteremia  Subjective: Sitting up in chair, without complaints  Abtx:  Anti-infectives    Start     Dose/Rate Route Frequency Ordered Stop   11/24/14 1900  cefTRIAXone (ROCEPHIN) 1 g in dextrose 5 % 50 mL IVPB     1 g 100 mL/hr over 30 Minutes Intravenous Every 24 hours 11/24/14 1814     11/24/14 1600  vancomycin (VANCOCIN) 500 mg in sodium chloride 0.9 % 100 mL IVPB     500 mg 100 mL/hr over 60 Minutes Intravenous Every 12 hours 11/24/14 1539     11/22/14 1335  dextrose 5 % with cefUROXime (ZINACEF) ADS Med    Comments:  [name], [name]   : cabinet override      11/22/14 1335 11/23/14 0144   11/22/14 0600  cefOXitin (MEFOXIN) 1 g in dextrose 5 % 50 mL IVPB  Status:  Discontinued     1 g 100 mL/hr over 30 Minutes Intravenous To Short Stay 11/21/14 0802 11/23/14 0719   11/14/14 2200  amoxicillin-clavulanate (AUGMENTIN) 875-125 MG per tablet 1 tablet  Status:  Discontinued     1 tablet Oral 2 times daily 11/14/14 1737 11/16/14 1746      Medications:  Scheduled: . cefTRIAXone (ROCEPHIN)  IV  1 g Intravenous Q24H  . Chlorhexidine Gluconate Cloth  6 each Topical Q0600  . donepezil  10 mg Oral QHS  . enoxaparin (LOVENOX) injection  40 mg Subcutaneous Q24H  . feeding supplement  1 Container Oral TID BM  . losartan  50 mg Oral Daily  . multivitamin with minerals  1 tablet Oral Daily  . mupirocin  ointment  1 application Nasal BID  . saccharomyces boulardii  250 mg Oral BID  . sodium chloride  3 mL Intravenous Q12H  . vancomycin  500 mg Intravenous Q12H  . vitamin C  500 mg Oral BID  . zinc sulfate  220 mg Oral Daily    Objective: Vital signs in last 24 hours: Temp:  [97.4 F (36.3 C)-99.4 F (37.4 C)] 98.5 F (36.9 C) (09/13 0414) Pulse Rate:  [93-107] 93 (09/13 0414) Resp:  [18] 18 (09/13 0414) BP: (127-139)/(56-86) 137/86 mmHg (09/13 0414) SpO2:  [100 %] 100 % (09/13 0414)   General appearance: alert, cooperative and no distress  Lab Results  Recent Labs  11/25/14 0348 11/26/14 0433  WBC 16.0* 15.1*  HGB 9.8* 10.8*  HCT 30.5* 32.9*  NA 137 137  K 3.2* 3.9  CL 105 105  CO2 26 25  BUN 5* <5*  CREATININE 0.58 0.60   Liver Panel No results for input(s): PROT, ALBUMIN, AST, ALT, ALKPHOS, BILITOT, BILIDIR, IBILI in the last 72 hours. Sedimentation Rate No results for input(s): ESRSEDRATE in the last 72 hours. C-Reactive Protein No results for input(s): CRP in the last 72 hours.  Microbiology: Recent Results (from the past 240 hour(s))  Surgical pcr screen  Status: None   Collection Time: 11/21/14  5:11 AM  Result Value Ref Range Status   MRSA, PCR NEGATIVE NEGATIVE Final   Staphylococcus aureus NEGATIVE NEGATIVE Final    Comment:        The Xpert SA Assay (FDA approved for NASAL specimens in patients over 50 years of age), is one component of a comprehensive surveillance program.  Test performance has been validated by [facility] for patients greater than or equal to 75 year old. It is not intended to diagnose infection nor to guide or monitor treatment.   Anaerobic culture     Status: None (Preliminary result)   Collection Time: 11/22/14  2:33 PM  Result Value Ref Range Status   Specimen Description TISSUE  Final   Special Requests SACRAL BONE BIOPSY  Final   Gram Stain   Final    NO WBC SEEN NO SQUAMOUS EPITHELIAL CELLS SEEN NO  ORGANISMS SEEN Performed at [facility]    Culture   Final    NO ANAEROBES ISOLATED; CULTURE IN PROGRESS FOR 5 DAYS Performed at [facility]    Report Status PENDING  Incomplete  Tissue culture     Status: None   Collection Time: 11/22/14  2:33 PM  Result Value Ref Range Status   Specimen Description TISSUE  Final   Special Requests SACRAL BONE BIOPSY  Final   Gram Stain   Final    NO WBC SEEN NO SQUAMOUS EPITHELIAL CELLS SEEN NO ORGANISMS SEEN Performed at [facility]    Culture   Final    FEW METHICILLIN RESISTANT STAPHYLOCOCCUS AUREUS Note: RIFAMPIN AND GENTAMICIN SHOULD NOT BE USED AS SINGLE DRUGS FOR TREATMENT OF STAPH INFECTIONS. This organism DOES NOT demonstrate inducible Clindamycin resistance in vitro. CRITICAL RESULT CALLED TO, READ BACK BY AND VERIFIED WITH: PATTY MOSS @ 9:30  AM 11/25/14 BY DWEEKS Performed at [facility]    Report Status 11/25/2014 FINAL  Final   Organism ID, Bacteria METHICILLIN RESISTANT STAPHYLOCOCCUS AUREUS  Final      Susceptibility   Methicillin resistant staphylococcus aureus - MIC*    CLINDAMYCIN <=0.25 SENSITIVE Sensitive     ERYTHROMYCIN >=8 RESISTANT Resistant     GENTAMICIN <=0.5 SENSITIVE Sensitive     LEVOFLOXACIN <=0.12 SENSITIVE Sensitive     OXACILLIN >=4 RESISTANT Resistant     RIFAMPIN <=0.5 SENSITIVE Sensitive     TRIMETH/SULFA <=10 SENSITIVE Sensitive     VANCOMYCIN 1 SENSITIVE Sensitive     TETRACYCLINE <=1 SENSITIVE Sensitive     * FEW METHICILLIN RESISTANT STAPHYLOCOCCUS AUREUS  Culture, blood (routine x 2)     Status: None (Preliminary result)   Collection Time: 11/24/14  8:31 AM  Result Value Ref Range Status   Specimen Description BLOOD LEFT HAND  Final   Special Requests BOTTLES DRAWN AEROBIC AND ANAEROBIC 10CC  Final   Culture NO GROWTH 1 DAY  Final   Report Status PENDING  Incomplete  Culture, blood (routine x 2)     Status: None (Preliminary result)   Collection Time:  11/24/14  8:37 AM  Result Value Ref Range Status   Specimen Description BLOOD RIGHT HAND  Final   Special Requests BOTTLES DRAWN AEROBIC AND ANAEROBIC 10CC  Final   Culture  Setup Time   Final    GRAM POSITIVE COCCI IN CLUSTERS AEROBIC BOTTLE ONLY CRITICAL RESULT CALLED TO, READ BACK BY AND VERIFIED WITH: L BLACKWELL,RN AT 1159 BY L BENFIELD    Culture STAPHYLOCOCCUS  AUREUS  Final   Report Status PENDING  Incomplete  Culture, Urine     Status: None (Preliminary result)   Collection Time: 11/24/14 11:27 AM  Result Value Ref Range Status   Specimen Description URINE, CATHETERIZED  Final   Special Requests NONE  Final   Culture CULTURE REINCUBATED FOR BETTER GROWTH  Final   Report Status PENDING  Incomplete    Studies/Results: No results found.   Assessment/Plan: Sacral Decubitus, diverting colostomy 9-9, foley Osteomyelitis  Bone Cx is growing MRSA  Will stop ceftriaxone  Plan for 6 weeks of anbx  Needs nutrition f/u  Off loading mattress, she is being d/c to SNF  Suspect WBC is due to stress of surgery  BCx 1/2 staph aureus  Could consider TEE but it will not change her course of 6 weeks of anbx  Repeat BCx 9-12 is ngtd.   Protein calorie malnutrition  Nutrition f/u, protein  Therapeutic drug monitoring  Cr stable  vanco level pending.   Total days of antibiotics: 3 vanco/ceftriaxone  Available as needed         Johny Sax Infectious Diseases (pager) [phone] [email or web address] 11/26/2014, 12:08 PM  LOS: 12 days

## 2014-11-27 LAB — ANAEROBIC CULTURE: Gram Stain: NONE SEEN

## 2014-11-27 LAB — CULTURE, BLOOD (ROUTINE X 2)

## 2014-11-27 LAB — URINE CULTURE: Culture: 40000

## 2014-11-29 LAB — CULTURE, BLOOD (ROUTINE X 2): Culture: NO GROWTH

## 2014-11-30 LAB — CULTURE, BLOOD (SINGLE): CULTURE: NO GROWTH

## 2014-12-10 ENCOUNTER — Ambulatory Visit: Payer: Medicare Other | Admitting: Family Medicine

## 2014-12-14 ENCOUNTER — Emergency Department (HOSPITAL_COMMUNITY)
Admission: EM | Admit: 2014-12-14 | Discharge: 2014-12-14 | Disposition: A | Discharge status: Skilled Nursing Facility | Payer: Medicare Other | Attending: Emergency Medicine | Admitting: Emergency Medicine

## 2014-12-14 ENCOUNTER — Encounter (HOSPITAL_COMMUNITY): Payer: Self-pay | Admitting: *Deleted

## 2014-12-14 DIAGNOSIS — M1990 Unspecified osteoarthritis, unspecified site: Secondary | ICD-10-CM | POA: Insufficient documentation

## 2014-12-14 DIAGNOSIS — Z872 Personal history of diseases of the skin and subcutaneous tissue: Secondary | ICD-10-CM | POA: Insufficient documentation

## 2014-12-14 DIAGNOSIS — Z452 Encounter for adjustment and management of vascular access device: Secondary | ICD-10-CM | POA: Insufficient documentation

## 2014-12-14 DIAGNOSIS — Z9889 Other specified postprocedural states: Secondary | ICD-10-CM | POA: Insufficient documentation

## 2014-12-14 DIAGNOSIS — Z8639 Personal history of other endocrine, nutritional and metabolic disease: Secondary | ICD-10-CM | POA: Diagnosis not present

## 2014-12-14 DIAGNOSIS — Z8719 Personal history of other diseases of the digestive system: Secondary | ICD-10-CM | POA: Diagnosis not present

## 2014-12-14 DIAGNOSIS — Z87891 Personal history of nicotine dependence: Secondary | ICD-10-CM | POA: Insufficient documentation

## 2014-12-14 DIAGNOSIS — I10 Essential (primary) hypertension: Secondary | ICD-10-CM | POA: Insufficient documentation

## 2014-12-14 DIAGNOSIS — F419 Anxiety disorder, unspecified: Secondary | ICD-10-CM | POA: Diagnosis not present

## 2014-12-14 DIAGNOSIS — Z79899 Other long term (current) drug therapy: Secondary | ICD-10-CM | POA: Insufficient documentation

## 2014-12-14 DIAGNOSIS — F0390 Unspecified dementia without behavioral disturbance: Secondary | ICD-10-CM | POA: Diagnosis not present

## 2014-12-14 NOTE — ED Notes (Signed)
Had PICC line in R bicep, from [place]. In PT today the line came out. At shift change this was noted by incoming nurse. No bleeding or bruising. Bandaid on site.  Received a dose of Dapytomycin this morning, hives noted diffusely to trunk and upper extremities.   AAO to self only, is baseline.   116/68

## 2014-12-14 NOTE — ED Provider Notes (Signed)
CSN: [id number]     Arrival date & time 12/14/14  1629 History   First MD Initiated Contact with Patient 12/14/14 1643     Chief Complaint  Patient presents with  . Vascular Access Problem     (Consider location/radiation/quality/duration/timing/severity/associated sxs/prior Treatment) HPI Comments: Patient presents via EMS from a nursing facility after her PICC line was pulled out. She is currently receiving IV antibiotics for osteomyelitis of the sacrum secondary to a large decubitus ulcer. She was hospitalized in September for this and is supposed to continue IV antibiotics until October 23. She's followed by infectious disease. She does have baseline dementia but currently is denying any complaints. There's been no reported fevers.   Past Medical History  Diagnosis Date  . Allergy   . Hyperlipidemia   . Hypertension   . Anxiety   . GERD (gastroesophageal reflux disease)   . Urinary frequency   . Neurogenic urinary incontinence     since back surgery  . Arthritis     degenerative oateoarthritis  . Decubital ulcer 11/14/2014  . Dementia    Past Surgical History  Procedure Laterality Date  . Appendectomy    . Cholecystectomy    . Abdominal hysterectomy    . Back surgery  2000  . Colonoscopy w/ polypectomy    . Breast surgery  1993    biopsy  . Total hip arthroplasty  01/07/2012    Procedure: TOTAL HIP ARTHROPLASTY;  Surgeon: Yvette Rack., MD;  Location: [place];  Service: Orthopedics;  Laterality: Right;  . Bone marrow biopsy N/A 11/22/2014    Procedure: SACRAL BONE BIOPSY PRONE POSITION;  Surgeon: Georganna Skeans, MD;  Location: [place];  Service: General;  Laterality: N/A;  . Ileo loop colostomy closure N/A 11/22/2014    Procedure: LAPAROSCOPIC LOOP COLOSTOMY ;  Surgeon: Georganna Skeans, MD;  Location: [facility] OR;  Service: General;  Laterality: N/A;   Family History  Problem Relation Age of Onset  . Diabetes Mother   . Alcohol abuse      fhx  . Arthritis      fhx  .  Hypertension      fhx  . Heart disease      fhx  . Cancer      colon, lung, prostate/fhx  . Stroke      fhx  . Depression      fhx   Social History  Substance Use Topics  . Smoking status: Former Smoker -- 0.50 packs/day for 10 years    Types: Cigarettes    Quit date: 09/23/1983  . Smokeless tobacco: Never Used  . Alcohol Use: No   OB History    No data available     Review of Systems  Unable to perform ROS: Dementia      Allergies  Codeine sulfate and Tramadol hcl  Home Medications   Prior to Admission medications   Medication Sig Start Date End Date Taking? Authorizing Provider  donepezil (ARICEPT) 10 MG tablet Take 1 tablet (10 mg total) by mouth at bedtime. 07/19/14  Yes Eulas Post, MD  feeding supplement (BOOST / RESOURCE BREEZE) LIQD Take 1 Container by mouth 3 (three) times daily between meals. 11/26/14  Yes Janece Canterbury, MD  furosemide (LASIX) 20 MG tablet TAKE 1 TABLET (20 MG TOTAL) BY MOUTH DAILY. 10/02/14  Yes Eulas Post, MD  losartan (COZAAR) 50 MG tablet TAKE 1 TABLET BY MOUTH DAILY Patient taking differently: TAKE 50 MG BY MOUTH DAILY 01/14/14  Yes  Eulas Post, MD  megestrol (MEGACE ORAL) 40 MG/ML suspension One tsp po bid Patient taking differently: Take 1,200 mg by mouth 2 (two) times daily.  06/19/14  Yes Eulas Post, MD  saccharomyces boulardii (FLORASTOR) 250 MG capsule Take 1 capsule (250 mg total) by mouth 2 (two) times daily. 11/26/14  Yes Janece Canterbury, MD  vitamin B-12 (CYANOCOBALAMIN) 500 MCG tablet Take 500 mcg by mouth daily.   Yes Historical Provider, MD  vitamin C (VITAMIN C) 500 MG tablet Take 1 tablet (500 mg total) by mouth 2 (two) times daily. 11/26/14  Yes Janece Canterbury, MD  zinc sulfate 220 MG capsule Take 1 capsule (220 mg total) by mouth daily. 11/26/14  Yes Janece Canterbury, MD  vancomycin 500 mg in sodium chloride 0.9 % 100 mL Inject 500 mg into the vein every 12 (twelve) hours. Patient not taking: Reported  on 12/14/2014 11/26/14   Janece Canterbury, MD   BP 131/71 mmHg  Pulse 101  Temp(Src) 98.8 F (37.1 C) (Oral)  Resp 16  SpO2 97% Physical Exam  Constitutional: She appears well-developed and well-nourished.  HENT:  Head: Normocephalic and atraumatic.  Eyes: Pupils are equal, round, and reactive to light.  Neck: Normal range of motion. Neck supple.  Cardiovascular: Normal rate, regular rhythm and normal heart sounds.   Pulmonary/Chest: Effort normal and breath sounds normal. No respiratory distress. She has no wheezes. She has no rales. She exhibits no tenderness.  Abdominal: Soft. Bowel sounds are normal. There is no tenderness. There is no rebound and no guarding.  She has a colostomy in place.  Musculoskeletal: Normal range of motion. She exhibits no edema.  She has a Band-Aid to her right upper arm where PICC line was removed. There is no active bleeding. No swelling or tenderness to the area.  Lymphadenopathy:    She has no cervical adenopathy.  Neurological: She is alert.  Answers questions but is confused at times, moves all actually symmetrically  Skin: Skin is warm and dry. No rash noted.  Psychiatric: She has a normal mood and affect.    ED Course  Procedures (including critical care time) Labs Review Labs Reviewed - No data to display  Imaging Review No results found. I have personally reviewed and evaluated these images and lab results as part of my medical decision-making.   EKG Interpretation None      MDM   Final diagnoses:  Adjustment and management of vascular access device    Patient presents with a PICC line that was inadvertently removed. There is no 1 available here today to replace the PICC line. Patient has received her dose of antibiotics for today. She's currently getting dapsone once daily. I spoke with Dr. Vernard Gambles with interventional radiologist who has arranged for patient to have PICC line placement at 8 AM tomorrow. I also spoke with the  nursing home staff who is aware of this. I also discussed this with the patient's family    Malvin Johns, MD 12/14/14 [phone]

## 2014-12-14 NOTE — ED Notes (Signed)
Bed: WA01 Expected date:  Expected time:  Means of arrival:  Comments: EMS- PICC line pulled out

## 2014-12-15 ENCOUNTER — Encounter (HOSPITAL_COMMUNITY): Payer: Self-pay

## 2014-12-15 ENCOUNTER — Emergency Department (HOSPITAL_COMMUNITY): Payer: Medicare Other

## 2014-12-15 ENCOUNTER — Emergency Department (HOSPITAL_COMMUNITY)
Admission: EM | Admit: 2014-12-15 | Discharge: 2014-12-15 | Disposition: A | Discharge status: Home / Self Care | Payer: Medicare Other | Attending: Emergency Medicine | Admitting: Emergency Medicine

## 2014-12-15 DIAGNOSIS — Z872 Personal history of diseases of the skin and subcutaneous tissue: Secondary | ICD-10-CM | POA: Insufficient documentation

## 2014-12-15 DIAGNOSIS — Z87891 Personal history of nicotine dependence: Secondary | ICD-10-CM | POA: Insufficient documentation

## 2014-12-15 DIAGNOSIS — Z8719 Personal history of other diseases of the digestive system: Secondary | ICD-10-CM | POA: Diagnosis not present

## 2014-12-15 DIAGNOSIS — Z8739 Personal history of other diseases of the musculoskeletal system and connective tissue: Secondary | ICD-10-CM | POA: Insufficient documentation

## 2014-12-15 DIAGNOSIS — Z79899 Other long term (current) drug therapy: Secondary | ICD-10-CM | POA: Diagnosis not present

## 2014-12-15 DIAGNOSIS — I10 Essential (primary) hypertension: Secondary | ICD-10-CM | POA: Insufficient documentation

## 2014-12-15 DIAGNOSIS — Z452 Encounter for adjustment and management of vascular access device: Secondary | ICD-10-CM | POA: Insufficient documentation

## 2014-12-15 DIAGNOSIS — Z8639 Personal history of other endocrine, nutritional and metabolic disease: Secondary | ICD-10-CM | POA: Insufficient documentation

## 2014-12-15 DIAGNOSIS — F0390 Unspecified dementia without behavioral disturbance: Secondary | ICD-10-CM | POA: Insufficient documentation

## 2014-12-15 DIAGNOSIS — Z789 Other specified health status: Secondary | ICD-10-CM

## 2014-12-15 MED ORDER — HEPARIN SOD (PORK) LOCK FLUSH 100 UNIT/ML IV SOLN
INTRAVENOUS | Status: AC
  Filled 2014-12-15: qty 5

## 2014-12-15 MED ORDER — LIDOCAINE HCL 1 % IJ SOLN
INTRAMUSCULAR | Status: AC
  Filled 2014-12-15: qty 20

## 2014-12-15 NOTE — ED Notes (Signed)
Per EMS- Patient is from [facility]. Patient has paper work stating that she is to have a PICC line placed today by IR. Radiology told [facility] that they did not have her scheduled for a PICC line today.

## 2014-12-15 NOTE — ED Notes (Signed)
Bed: [facility] Expected date:  Expected time:  Means of arrival:  Comments: Hold for IR pt

## 2014-12-15 NOTE — ED Notes (Signed)
[facility] notified of need for transportation back to [facility].

## 2014-12-15 NOTE — ED Notes (Signed)
Right arm PICC line present. NO bleeding or swelling noted.

## 2014-12-15 NOTE — ED Notes (Signed)
Patient is to go to IR today per Dr. Deanne Coffer. IR will come and pick patient up.

## 2014-12-15 NOTE — ED Notes (Signed)
Report given to Crystal at [facility].

## 2014-12-15 NOTE — ED Provider Notes (Signed)
CSN: [id number]     Arrival date & time 12/15/14  5329 History   First MD Initiated Contact with Patient 12/15/14 [phone]     Chief Complaint  Patient presents with  . PICC line placement    HPI Patient presents to the emergency room for placement of a PICC line. Patient was in the hospital last month for treatment of osteomyelitis associated with a sacral decubitus ulcer. Patient was discharged from the hospital on September 13 to a nursing facility. The patient inadvertently removed the PICC line yesterday. She was sent to the emergency room yesterday. The patient was given a dose of IV antibiotics but there was no one available to replace her PICC line at the time she was seen. Patient was told to return to the emergency room today at 8 AM to have that procedure performed by Dr. Vernard Gambles.  Patient denies any complaints. She denies any fevers or chills. No vomiting or diarrhea.  Past Medical History  Diagnosis Date  . Allergy   . Hyperlipidemia   . Hypertension   . Anxiety   . GERD (gastroesophageal reflux disease)   . Urinary frequency   . Neurogenic urinary incontinence     since back surgery  . Arthritis     degenerative oateoarthritis  . Decubital ulcer 11/14/2014  . Dementia    Past Surgical History  Procedure Laterality Date  . Appendectomy    . Cholecystectomy    . Abdominal hysterectomy    . Back surgery  2000  . Colonoscopy w/ polypectomy    . Breast surgery  1993    biopsy  . Total hip arthroplasty  01/07/2012    Procedure: TOTAL HIP ARTHROPLASTY;  Surgeon: Yvette Rack., MD;  Location: [place] [facility];  Service: Orthopedics;  Laterality: Right;  . Bone marrow biopsy N/A 11/22/2014    Procedure: SACRAL BONE BIOPSY PRONE POSITION;  Surgeon: Georganna Skeans, MD;  Location: [place];  Service: General;  Laterality: N/A;  . Ileo loop colostomy closure N/A 11/22/2014    Procedure: LAPAROSCOPIC LOOP COLOSTOMY ;  Surgeon: Georganna Skeans, MD;  Location: [facility] OR;  Service: General;  Laterality:  N/A;   Family History  Problem Relation Age of Onset  . Diabetes Mother   . Alcohol abuse      fhx  . Arthritis      fhx  . Hypertension      fhx  . Heart disease      fhx  . Cancer      colon, lung, prostate/fhx  . Stroke      fhx  . Depression      fhx   Social History  Substance Use Topics  . Smoking status: Former Smoker -- 0.50 packs/day for 10 years    Types: Cigarettes    Quit date: 09/23/1983  . Smokeless tobacco: Never Used  . Alcohol Use: No   OB History    No data available     Review of Systems  All other systems reviewed and are negative.     Allergies  Codeine sulfate and Tramadol hcl  Home Medications   Prior to Admission medications   Medication Sig Start Date End Date Taking? Authorizing Provider  donepezil (ARICEPT) 10 MG tablet Take 1 tablet (10 mg total) by mouth at bedtime. 07/19/14   Eulas Post, MD  feeding supplement (BOOST / RESOURCE BREEZE) LIQD Take 1 Container by mouth 3 (three) times daily between meals. 11/26/14   Janece Canterbury, MD  furosemide (  LASIX) 20 MG tablet TAKE 1 TABLET (20 MG TOTAL) BY MOUTH DAILY. 10/02/14   Eulas Post, MD  losartan (COZAAR) 50 MG tablet TAKE 1 TABLET BY MOUTH DAILY Patient taking differently: TAKE 50 MG BY MOUTH DAILY 01/14/14   Eulas Post, MD  megestrol (MEGACE ORAL) 40 MG/ML suspension One tsp po bid Patient taking differently: Take 1,200 mg by mouth 2 (two) times daily.  06/19/14   Eulas Post, MD  saccharomyces boulardii (FLORASTOR) 250 MG capsule Take 1 capsule (250 mg total) by mouth 2 (two) times daily. 11/26/14   Janece Canterbury, MD  vancomycin 500 mg in sodium chloride 0.9 % 100 mL Inject 500 mg into the vein every 12 (twelve) hours. Patient not taking: Reported on 12/14/2014 11/26/14   Janece Canterbury, MD  vitamin B-12 (CYANOCOBALAMIN) 500 MCG tablet Take 500 mcg by mouth daily.    Historical Provider, MD  vitamin C (VITAMIN C) 500 MG tablet Take 1 tablet (500 mg total) by  mouth 2 (two) times daily. 11/26/14   Janece Canterbury, MD  zinc sulfate 220 MG capsule Take 1 capsule (220 mg total) by mouth daily. 11/26/14   Janece Canterbury, MD   BP 133/80 mmHg  Pulse 81  Temp(Src) 98.1 F (36.7 C) (Oral)  Resp 16  SpO2 99% Physical Exam  Constitutional: No distress.  HENT:  Head: Normocephalic and atraumatic.  Right Ear: External ear normal.  Left Ear: External ear normal.  Eyes: Conjunctivae are normal. Right eye exhibits no discharge. Left eye exhibits no discharge. No scleral icterus.  Neck: Neck supple. No tracheal deviation present.  Cardiovascular: Normal rate.   Pulmonary/Chest: Effort normal. No stridor. No respiratory distress.  Musculoskeletal: She exhibits no edema.  Neurological: She is alert. Cranial nerve deficit: no gross deficits.  Skin: Skin is warm and dry. No rash noted.  Psychiatric: She has a normal mood and affect.  Nursing note and vitals reviewed.   ED Course  Procedures (including critical care time)   MDM   Final diagnoses:  Difficult intravenous access   Dr. Vernard Gambles is expecting the patient.  Pt will go to IR to have her line placed.  No acute emergency medical condition present at this time.    Dorie Rank, MD 12/15/14 [phone]

## 2014-12-16 ENCOUNTER — Telehealth: Payer: Self-pay | Admitting: Infectious Diseases

## 2014-12-16 NOTE — Telephone Encounter (Signed)
Chan,RN from [facility] nursing home called stating that this patient developed a full body rash yesterday on Dapsone. RN consulted with PCP medication has been stopped, currently getting Benadryl, but wanted her to call ID and get further orders. Please advise?

## 2014-12-16 NOTE — Telephone Encounter (Signed)
Pt should not be on dapsone.  Continue benadryl.  Ask SNF to call us back on wed/thursday  thanks

## 2014-12-16 NOTE — Telephone Encounter (Signed)
I tried calling the SNF multiple times, with no answer. The last try I stayed on hold and then was hung up on. I will try again later.

## 2014-12-25 ENCOUNTER — Ambulatory Visit: Payer: Medicare Other | Admitting: Surgery

## 2014-12-26 DIAGNOSIS — E46 Unspecified protein-calorie malnutrition: Secondary | ICD-10-CM | POA: Diagnosis not present

## 2014-12-26 DIAGNOSIS — F0391 Unspecified dementia with behavioral disturbance: Secondary | ICD-10-CM | POA: Diagnosis not present

## 2014-12-26 DIAGNOSIS — Z438 Encounter for attention to other artificial openings: Secondary | ICD-10-CM | POA: Diagnosis not present

## 2014-12-26 DIAGNOSIS — L89154 Pressure ulcer of sacral region, stage 4: Secondary | ICD-10-CM | POA: Diagnosis not present

## 2014-12-26 DIAGNOSIS — I10 Essential (primary) hypertension: Secondary | ICD-10-CM | POA: Diagnosis not present

## 2015-01-06 ENCOUNTER — Ambulatory Visit (INDEPENDENT_AMBULATORY_CARE_PROVIDER_SITE_OTHER): Payer: Medicare Other | Admitting: Infectious Diseases

## 2015-01-06 ENCOUNTER — Encounter: Payer: Self-pay | Admitting: Infectious Diseases

## 2015-01-06 VITALS — BP 158/91 | HR 85 | Temp 97.8°F | Ht 67.0 in | Wt 117.0 lb

## 2015-01-06 DIAGNOSIS — R7881 Bacteremia: Secondary | ICD-10-CM

## 2015-01-06 DIAGNOSIS — Z23 Encounter for immunization: Secondary | ICD-10-CM

## 2015-01-06 DIAGNOSIS — L8994 Pressure ulcer of unspecified site, stage 4: Secondary | ICD-10-CM

## 2015-01-06 DIAGNOSIS — E43 Unspecified severe protein-calorie malnutrition: Secondary | ICD-10-CM | POA: Diagnosis not present

## 2015-01-06 DIAGNOSIS — Z933 Colostomy status: Secondary | ICD-10-CM | POA: Diagnosis not present

## 2015-01-06 NOTE — Assessment & Plan Note (Signed)
She needs to f/u with surgery

## 2015-01-06 NOTE — Progress Notes (Signed)
   Subjective:    Patient ID: [name] Frank, female    DOB: 1934-10-18, 80 y.o.   MRN: [id number]  HPI 80 yo F with past medical history of dementia and limited mobility, also reduced sensation in pelvic area due to prior back surgery. She developed a stage IV decubitus ulcer.   She was receiving care at the wound care center at [facility]. It appears cultures done at that facility on 10/30/2014 grew Enterococcus species  that was susceptible to ampicillin and was started on Augmentin therapy. Her ulcer had worsened and she had plain films had shown pelvic osteomyelitis  She as adm to [facility] on 9-1 and underwent bone Bx (MRSA) and diverting colostomy. She also had BCx 1/2 MRSA from 9-11. Her repeat BCx on 9-12 was (-).  She was sent home with IV vancomycin with a completion date of 10-23.  She had no problems with her PIC.  No fever or chills. Has had prurutis of her arms. Family has noted that she has dry skin.  No problems with her colostomy that she is aware of.  Has not gotten flu shot.   Review of Systems  Constitutional: Positive for appetite change. Negative for fever and chills.  Gastrointestinal: Negative for diarrhea and constipation.  Genitourinary: Negative for difficulty urinating.  [facility] Has seen nutrition at SNF (per family). Has airflow bed at SNF.      Objective:   Physical Exam  Constitutional: She appears lethargic. She appears cachectic.  HENT:  Mouth/Throat: No oropharyngeal exudate.  Eyes: EOM are normal. Pupils are equal, round, and reactive to light.  Neck: Neck supple.  Cardiovascular: Normal rate, regular rhythm and normal heart sounds.   Pulmonary/Chest: Effort normal and breath sounds normal.  Abdominal: Soft. Bowel sounds are normal. There is no tenderness. There is no rebound.    Musculoskeletal:       Arms: Lymphadenopathy:    She has no cervical adenopathy.  Neurological: She appears lethargic.     Assessment & Plan:

## 2015-01-06 NOTE — Assessment & Plan Note (Signed)
Will recheck her pre-alb Per family, [facility]

## 2015-01-06 NOTE — Assessment & Plan Note (Signed)
She has completed her anbx Will pull her PIC She needs 1) off loading/airflow bed. 2) nutrition, 3) wound care follow up.  Per her family she is getting these things at her SNF . She can be otherwise managed per snf.  We will give her flu shot today.  Will see her back in 3 months.

## 2015-01-06 NOTE — Assessment & Plan Note (Signed)
Will recheck her BCx 

## 2015-01-06 NOTE — Progress Notes (Signed)
RN received verbal order to discontinue the patient's PICC line.  Patient identified with name and date of birth. PICC dressing removed, site unremarkable.  Sutures removed without difficulty.  PICC line removed using sterile procedure @ 1200. PICC length equal to that noted in patient's hospital chart of 40 cm. Sterile petroleum gauze + sterile 4X4 applied to PICC site, pressure applied for 10 minutes and covered with Medipore tape as a pressure dressing. Patient tolerated procedure without complaints.  Patient instructed to limit use of arm for 1 hour. Patient instructed that the pressure dressing should remain in place for 24 hours. Patient verbalized understanding of these instructions.

## 2015-01-07 LAB — COMPREHENSIVE METABOLIC PANEL

## 2015-01-07 LAB — CBC

## 2015-01-07 LAB — SEDIMENTATION RATE

## 2015-01-07 LAB — C-REACTIVE PROTEIN

## 2015-01-07 LAB — PREALBUMIN

## 2015-01-08 ENCOUNTER — Encounter (HOSPITAL_BASED_OUTPATIENT_CLINIC_OR_DEPARTMENT_OTHER): Payer: Medicare Other | Attending: Surgery

## 2015-01-08 DIAGNOSIS — F0390 Unspecified dementia without behavioral disturbance: Secondary | ICD-10-CM | POA: Insufficient documentation

## 2015-01-08 DIAGNOSIS — L89154 Pressure ulcer of sacral region, stage 4: Secondary | ICD-10-CM | POA: Diagnosis not present

## 2015-01-08 DIAGNOSIS — M8638 Chronic multifocal osteomyelitis, other site: Secondary | ICD-10-CM | POA: Diagnosis not present

## 2015-01-08 DIAGNOSIS — I10 Essential (primary) hypertension: Secondary | ICD-10-CM | POA: Insufficient documentation

## 2015-01-08 DIAGNOSIS — M1990 Unspecified osteoarthritis, unspecified site: Secondary | ICD-10-CM | POA: Insufficient documentation

## 2015-01-08 DIAGNOSIS — E440 Moderate protein-calorie malnutrition: Secondary | ICD-10-CM | POA: Diagnosis not present

## 2015-01-08 DIAGNOSIS — B9562 Methicillin resistant Staphylococcus aureus infection as the cause of diseases classified elsewhere: Secondary | ICD-10-CM | POA: Insufficient documentation

## 2015-01-08 DIAGNOSIS — R32 Unspecified urinary incontinence: Secondary | ICD-10-CM | POA: Diagnosis not present

## 2015-01-08 DIAGNOSIS — Z87891 Personal history of nicotine dependence: Secondary | ICD-10-CM | POA: Diagnosis not present

## 2015-01-13 NOTE — Addendum Note (Signed)
Addended by: Jennet MaduroESTRIDGE, Tolbert Matheson D on: 01/13/2015 02:43 PM   Modules accepted: Orders

## 2015-01-13 NOTE — Addendum Note (Signed)
Addended by: Mariea ClontsGREEN, Kensley Lares D on: 01/13/2015 03:22 PM   Modules accepted: Orders

## 2015-01-14 LAB — CBC WITH DIFFERENTIAL/PLATELET
BASOS ABS: 0 10*3/uL (ref 0.0–0.1)
Basophils Relative: 0 % (ref 0–1)
Eosinophils Absolute: 0.6 10*3/uL (ref 0.0–0.7)
Eosinophils Relative: 4 % (ref 0–5)
HCT: 41.6 % (ref 36.0–46.0)
HEMOGLOBIN: 13.7 g/dL (ref 12.0–15.0)
LYMPHS ABS: 2.5 10*3/uL (ref 0.7–4.0)
LYMPHS PCT: 16 % (ref 12–46)
MCH: 28 pg (ref 26.0–34.0)
MCHC: 32.9 g/dL (ref 30.0–36.0)
MCV: 84.9 fL (ref 78.0–100.0)
MONOS PCT: 2 % — AB (ref 3–12)
MPV: 9.7 fL (ref 8.6–12.4)
Monocytes Absolute: 0.3 10*3/uL (ref 0.1–1.0)
NEUTROS ABS: 12.1 10*3/uL — AB (ref 1.7–7.7)
NEUTROS PCT: 78 % — AB (ref 43–77)
Platelets: 227 10*3/uL (ref 150–400)
RBC: 4.9 MIL/uL (ref 3.87–5.11)
RDW: 17 % — ABNORMAL HIGH (ref 11.5–15.5)
WBC: 15.5 10*3/uL — ABNORMAL HIGH (ref 4.0–10.5)

## 2015-01-14 LAB — COMPREHENSIVE METABOLIC PANEL
ALK PHOS: 66 U/L (ref 33–130)
ALT: 14 U/L (ref 6–29)
AST: 16 U/L (ref 10–35)
Albumin: 3.1 g/dL — ABNORMAL LOW (ref 3.6–5.1)
BUN: 14 mg/dL (ref 7–25)
CHLORIDE: 106 mmol/L (ref 98–110)
CO2: 22 mmol/L (ref 20–31)
CREATININE: 0.7 mg/dL (ref 0.60–0.93)
Calcium: 9.1 mg/dL (ref 8.6–10.4)
Glucose, Bld: 124 mg/dL — ABNORMAL HIGH (ref 65–99)
POTASSIUM: 3.7 mmol/L (ref 3.5–5.3)
Sodium: 142 mmol/L (ref 135–146)
TOTAL PROTEIN: 6.2 g/dL (ref 6.1–8.1)
Total Bilirubin: 0.4 mg/dL (ref 0.2–1.2)

## 2015-01-14 LAB — C-REACTIVE PROTEIN: CRP: 4.4 mg/dL — ABNORMAL HIGH (ref <0.60)

## 2015-01-14 LAB — SEDIMENTATION RATE: Sed Rate: 6 mm/hr (ref 0–30)

## 2015-01-15 ENCOUNTER — Encounter (HOSPITAL_BASED_OUTPATIENT_CLINIC_OR_DEPARTMENT_OTHER): Payer: Medicare Other | Attending: Surgery

## 2015-01-15 DIAGNOSIS — Z8614 Personal history of Methicillin resistant Staphylococcus aureus infection: Secondary | ICD-10-CM | POA: Insufficient documentation

## 2015-01-15 DIAGNOSIS — Z8739 Personal history of other diseases of the musculoskeletal system and connective tissue: Secondary | ICD-10-CM | POA: Insufficient documentation

## 2015-01-15 DIAGNOSIS — L89154 Pressure ulcer of sacral region, stage 4: Secondary | ICD-10-CM | POA: Diagnosis not present

## 2015-01-15 DIAGNOSIS — M1990 Unspecified osteoarthritis, unspecified site: Secondary | ICD-10-CM | POA: Insufficient documentation

## 2015-01-15 DIAGNOSIS — Z96649 Presence of unspecified artificial hip joint: Secondary | ICD-10-CM | POA: Insufficient documentation

## 2015-01-15 DIAGNOSIS — Z933 Colostomy status: Secondary | ICD-10-CM | POA: Insufficient documentation

## 2015-01-15 DIAGNOSIS — F0390 Unspecified dementia without behavioral disturbance: Secondary | ICD-10-CM | POA: Insufficient documentation

## 2015-01-15 DIAGNOSIS — E440 Moderate protein-calorie malnutrition: Secondary | ICD-10-CM | POA: Diagnosis not present

## 2015-01-15 DIAGNOSIS — I10 Essential (primary) hypertension: Secondary | ICD-10-CM | POA: Insufficient documentation

## 2015-01-15 LAB — PREALBUMIN: PREALBUMIN: 18 mg/dL (ref 17–34)

## 2015-01-20 DIAGNOSIS — R262 Difficulty in walking, not elsewhere classified: Secondary | ICD-10-CM | POA: Diagnosis not present

## 2015-01-20 DIAGNOSIS — M869 Osteomyelitis, unspecified: Secondary | ICD-10-CM | POA: Diagnosis not present

## 2015-01-20 DIAGNOSIS — M1990 Unspecified osteoarthritis, unspecified site: Secondary | ICD-10-CM | POA: Diagnosis not present

## 2015-01-20 DIAGNOSIS — M6281 Muscle weakness (generalized): Secondary | ICD-10-CM | POA: Diagnosis not present

## 2015-01-20 LAB — CULTURE, BLOOD (SINGLE)
ORGANISM ID, BACTERIA: NO GROWTH
ORGANISM ID, BACTERIA: NO GROWTH

## 2015-01-21 DIAGNOSIS — M1990 Unspecified osteoarthritis, unspecified site: Secondary | ICD-10-CM | POA: Diagnosis not present

## 2015-01-21 DIAGNOSIS — R262 Difficulty in walking, not elsewhere classified: Secondary | ICD-10-CM | POA: Diagnosis not present

## 2015-01-21 DIAGNOSIS — M6281 Muscle weakness (generalized): Secondary | ICD-10-CM | POA: Diagnosis not present

## 2015-01-21 DIAGNOSIS — M869 Osteomyelitis, unspecified: Secondary | ICD-10-CM | POA: Diagnosis not present

## 2015-01-22 DIAGNOSIS — R262 Difficulty in walking, not elsewhere classified: Secondary | ICD-10-CM | POA: Diagnosis not present

## 2015-01-22 DIAGNOSIS — M1990 Unspecified osteoarthritis, unspecified site: Secondary | ICD-10-CM | POA: Diagnosis not present

## 2015-01-22 DIAGNOSIS — M6281 Muscle weakness (generalized): Secondary | ICD-10-CM | POA: Diagnosis not present

## 2015-01-22 DIAGNOSIS — M869 Osteomyelitis, unspecified: Secondary | ICD-10-CM | POA: Diagnosis not present

## 2015-01-23 ENCOUNTER — Encounter: Payer: Self-pay | Admitting: Family Medicine

## 2015-01-23 ENCOUNTER — Ambulatory Visit (INDEPENDENT_AMBULATORY_CARE_PROVIDER_SITE_OTHER): Payer: Medicare Other | Admitting: Family Medicine

## 2015-01-23 VITALS — BP 160/110 | HR 92 | Temp 98.6°F | Resp 14 | Ht 67.0 in | Wt 122.6 lb

## 2015-01-23 DIAGNOSIS — L8994 Pressure ulcer of unspecified site, stage 4: Secondary | ICD-10-CM | POA: Diagnosis not present

## 2015-01-23 DIAGNOSIS — I10 Essential (primary) hypertension: Secondary | ICD-10-CM | POA: Diagnosis not present

## 2015-01-23 DIAGNOSIS — L853 Xerosis cutis: Secondary | ICD-10-CM

## 2015-01-23 DIAGNOSIS — L850 Acquired ichthyosis: Secondary | ICD-10-CM

## 2015-01-23 NOTE — Progress Notes (Signed)
Subjective:    Patient ID: [name] Frank, female    DOB: [birth year], 80 y.o.   MRN: [id number]  HPI Patient is brought in by husband and daughter with chief complaint of pruritic rash.  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  She is currently residing in a nursing home and family brought her in today for further evaluation. She apparently has had a couple weeks of dry pruritic skin involving her extremities and trunk. She has apparently been given Benadryl 25 mg which is not helping. They've tried some various over-the-counter moisturizers without  improvement. No recent reported change of medications. We did review her list of medications from the nursing home.  She has some confusion at baseline and very limited history available from patient. She has gained some weight slightly over the past few weeks but appetite remains fair. No reported fever. She's not had any significant progress in healing of her sacral decubitus ulcer which was stage IV  Past Medical History  Diagnosis Date  . Allergy   . Hyperlipidemia   . Hypertension   . Anxiety   . GERD (gastroesophageal reflux disease)   . Urinary frequency   . Neurogenic urinary incontinence     since back surgery  . Arthritis     degenerative oateoarthritis  . Decubital ulcer 11/14/2014  . Dementia    Past Surgical History  Procedure Laterality Date  . Appendectomy    . Cholecystectomy    . Abdominal hysterectomy    . Back surgery  2000  . Colonoscopy w/ polypectomy    . Breast surgery  1993    biopsy  . Total hip arthroplasty  01/07/2012    Procedure: TOTAL HIP ARTHROPLASTY;  Surgeon: Yvette Rack., MD;  Location: [facility];  Service: Orthopedics;  Laterality: Right;  . Bone marrow biopsy N/A 11/22/2014    Procedure: SACRAL BONE  BIOPSY PRONE POSITION;  Surgeon: Georganna Skeans, MD;  Location: [place];  Service: General;  Laterality: N/A;  . Ileo loop colostomy closure N/A 11/22/2014    Procedure: LAPAROSCOPIC LOOP COLOSTOMY ;  Surgeon: Georganna Skeans, MD;  Location: [place];  Service: General;  Laterality: N/A;    reports that she quit smoking about 31 years ago. Her smoking use included Cigarettes. She has a 5 pack-year smoking history. She has never used smokeless tobacco. She reports that she does not drink alcohol or use illicit drugs. family history includes Alcohol abuse in an other family member; Arthritis in an other family member; Cancer in an other family member; Depression in an other family member; Diabetes in her mother; Heart disease in an other family member; Hypertension in an other family member; Stroke in an other family member. Allergies  Allergen Reactions  . Codeine Sulfate Other (See Comments)    stomach pains  . Tramadol Hcl Other (See Comments)    stomach cramps      Review of Systems  Constitutional: Negative for fever and chills.  Respiratory: Negative for cough.   Cardiovascular: Negative for chest pain.  Skin: Positive for rash.  Psychiatric/Behavioral: Positive for confusion. Negative for agitation.       Objective:   Physical Exam  Constitutional:  Thin, alert, elderly female who is frequently scratching and rubbing both arms during exam  Cardiovascular: Normal rate and regular rhythm.   Pulmonary/Chest: Effort normal and breath sounds normal.  No respiratory distress. She has no wheezes. She has no rales.  Musculoskeletal: She exhibits no edema.  Neurological: She is alert.  Skin: Rash noted.  She has generally very dry scan with several excoriations on upper extremities. No pustules. She also has significant dryness involving her back and trunk          Assessment & Plan:  #1 dry skin dermatitis. Use gentle soap such as Dove. Discontinue Benadryl. Hydroxyzine 25 mg daily at  bedtime when necessary. Triamcinolone 0.1% cream compound 1-1 with Eucerin and apply twice daily as needed. #2 hypertension poorly controlled. Add back amlodipine 5 mg once daily to her other blood pressure medications. Reassess 2 weeks

## 2015-01-29 DIAGNOSIS — Z8739 Personal history of other diseases of the musculoskeletal system and connective tissue: Secondary | ICD-10-CM | POA: Diagnosis not present

## 2015-01-29 DIAGNOSIS — L89154 Pressure ulcer of sacral region, stage 4: Secondary | ICD-10-CM | POA: Diagnosis not present

## 2015-01-29 DIAGNOSIS — Z96649 Presence of unspecified artificial hip joint: Secondary | ICD-10-CM | POA: Diagnosis not present

## 2015-01-29 DIAGNOSIS — I10 Essential (primary) hypertension: Secondary | ICD-10-CM | POA: Diagnosis not present

## 2015-01-29 DIAGNOSIS — Z8614 Personal history of Methicillin resistant Staphylococcus aureus infection: Secondary | ICD-10-CM | POA: Diagnosis not present

## 2015-01-29 DIAGNOSIS — M1990 Unspecified osteoarthritis, unspecified site: Secondary | ICD-10-CM | POA: Diagnosis not present

## 2015-01-29 DIAGNOSIS — Z933 Colostomy status: Secondary | ICD-10-CM | POA: Diagnosis not present

## 2015-01-29 DIAGNOSIS — F0390 Unspecified dementia without behavioral disturbance: Secondary | ICD-10-CM | POA: Diagnosis not present

## 2015-02-05 DIAGNOSIS — F0390 Unspecified dementia without behavioral disturbance: Secondary | ICD-10-CM | POA: Diagnosis not present

## 2015-02-05 DIAGNOSIS — L89154 Pressure ulcer of sacral region, stage 4: Secondary | ICD-10-CM | POA: Diagnosis not present

## 2015-02-05 DIAGNOSIS — E440 Moderate protein-calorie malnutrition: Secondary | ICD-10-CM | POA: Diagnosis not present

## 2015-02-05 DIAGNOSIS — Z96649 Presence of unspecified artificial hip joint: Secondary | ICD-10-CM | POA: Diagnosis not present

## 2015-02-05 DIAGNOSIS — R32 Unspecified urinary incontinence: Secondary | ICD-10-CM | POA: Diagnosis not present

## 2015-02-05 DIAGNOSIS — M1990 Unspecified osteoarthritis, unspecified site: Secondary | ICD-10-CM | POA: Diagnosis not present

## 2015-02-05 DIAGNOSIS — I10 Essential (primary) hypertension: Secondary | ICD-10-CM | POA: Diagnosis not present

## 2015-02-05 DIAGNOSIS — Z8614 Personal history of Methicillin resistant Staphylococcus aureus infection: Secondary | ICD-10-CM | POA: Diagnosis not present

## 2015-02-05 DIAGNOSIS — Z933 Colostomy status: Secondary | ICD-10-CM | POA: Diagnosis not present

## 2015-02-05 DIAGNOSIS — Z8739 Personal history of other diseases of the musculoskeletal system and connective tissue: Secondary | ICD-10-CM | POA: Diagnosis not present

## 2015-02-10 ENCOUNTER — Encounter: Payer: Self-pay | Admitting: Family Medicine

## 2015-02-10 ENCOUNTER — Ambulatory Visit (INDEPENDENT_AMBULATORY_CARE_PROVIDER_SITE_OTHER): Payer: Medicare Other | Admitting: Family Medicine

## 2015-02-10 VITALS — BP 130/90 | HR 91 | Temp 97.4°F | Resp 16 | Wt 123.0 lb

## 2015-02-10 DIAGNOSIS — I10 Essential (primary) hypertension: Secondary | ICD-10-CM

## 2015-02-10 DIAGNOSIS — L853 Xerosis cutis: Secondary | ICD-10-CM

## 2015-02-10 DIAGNOSIS — L850 Acquired ichthyosis: Secondary | ICD-10-CM | POA: Diagnosis not present

## 2015-02-10 NOTE — Progress Notes (Signed)
Pre visit review using our clinic review tool, if applicable. No additional management support is needed unless otherwise documented below in the visit note. 

## 2015-02-10 NOTE — Progress Notes (Signed)
Subjective:    Patient ID: [name] Frank, female    DOB: December 15, 1934, 80 y.o.   MRN: [id number]  HPI  Here for follow-up hypertension  Recent blood pressure reading 160/110. Started back amlodipine 5 mg daily. She is currently in a long-term care facility and we cannot confirm that she is actually getting that. Her husband thinks she is. Blood pressure improved today. History not viable as she has advanced dementia  Dry skin dermatitis. Recently started triamcinolone and Eucerin compounded one-to-one. Husband does not think they're applying this regularly. She has some improved. Does not seem as agitated with scratching.  She has advanced stage IV decubitus ulcer sacral region which is followed by wound care. Husband has some concerns whether staff are rotating her as instructed  Past Medical History  Diagnosis Date  . Allergy   . Hyperlipidemia   . Hypertension   . Anxiety   . GERD (gastroesophageal reflux disease)   . Urinary frequency   . Neurogenic urinary incontinence     since back surgery  . Arthritis     degenerative oateoarthritis  . Decubital ulcer 11/14/2014  . Dementia    Past Surgical History  Procedure Laterality Date  . Appendectomy    . Cholecystectomy    . Abdominal hysterectomy    . Back surgery  2000  . Colonoscopy w/ polypectomy    . Breast surgery  1993    biopsy  . Total hip arthroplasty  01/07/2012    Procedure: TOTAL HIP ARTHROPLASTY;  Surgeon: Yvette Rack., MD;  Location: [facility];  Service: Orthopedics;  Laterality: Right;  . Bone marrow biopsy N/A 11/22/2014    Procedure: SACRAL BONE BIOPSY PRONE POSITION;  Surgeon: Georganna Skeans, MD;  Location: [place];  Service: General;  Laterality: N/A;  . Ileo loop colostomy closure N/A 11/22/2014    Procedure: LAPAROSCOPIC LOOP COLOSTOMY ;  Surgeon: Georganna Skeans, MD;  Location: [place];  Service: General;  Laterality: N/A;    reports that she quit smoking about 31 years ago. Her smoking use included  Cigarettes. She has a 5 pack-year smoking history. She has never used smokeless tobacco. She reports that she does not drink alcohol or use illicit drugs. family history includes Alcohol abuse in an other family member; Arthritis in an other family member; Cancer in an other family member; Depression in an other family member; Diabetes in her mother; Heart disease in an other family member; Hypertension in an other family member; Stroke in an other family member. Allergies  Allergen Reactions  . Codeine Sulfate Other (See Comments)    stomach pains  . Tramadol Hcl Other (See Comments)    stomach cramps     Review of Systems  Eyes: Negative for visual disturbance.  Respiratory: Negative for cough, chest tightness, shortness of breath and wheezing.   Cardiovascular: Negative for chest pain and leg swelling.  Endocrine: Negative for polydipsia and polyuria.  Skin: Positive for rash.       Objective:   Physical Exam  Constitutional: She appears well-developed and well-nourished.  Cardiovascular: Normal rate and regular rhythm.   Pulmonary/Chest: Effort normal and breath sounds normal. No respiratory distress. She has no wheezes. She has no rales.  Musculoskeletal: She exhibits no edema.  Neurological: She is alert.  Skin:  Very dry skin diffusely. A few excoriations.          Assessment & Plan:  #1 hypertension improved. Continue current medication regimen #2 dry skin dermatitis. Wrote order  for triamcinolone compounded one-to-one with Eucerin and apply twice daily for 2 weeks and then as needed.

## 2015-02-12 DIAGNOSIS — M8638 Chronic multifocal osteomyelitis, other site: Secondary | ICD-10-CM | POA: Diagnosis not present

## 2015-02-12 DIAGNOSIS — Z96649 Presence of unspecified artificial hip joint: Secondary | ICD-10-CM | POA: Diagnosis not present

## 2015-02-12 DIAGNOSIS — Z8739 Personal history of other diseases of the musculoskeletal system and connective tissue: Secondary | ICD-10-CM | POA: Diagnosis not present

## 2015-02-12 DIAGNOSIS — R32 Unspecified urinary incontinence: Secondary | ICD-10-CM | POA: Diagnosis not present

## 2015-02-12 DIAGNOSIS — L89154 Pressure ulcer of sacral region, stage 4: Secondary | ICD-10-CM | POA: Diagnosis not present

## 2015-02-12 DIAGNOSIS — M1990 Unspecified osteoarthritis, unspecified site: Secondary | ICD-10-CM | POA: Diagnosis not present

## 2015-02-12 DIAGNOSIS — I10 Essential (primary) hypertension: Secondary | ICD-10-CM | POA: Diagnosis not present

## 2015-02-12 DIAGNOSIS — Z8614 Personal history of Methicillin resistant Staphylococcus aureus infection: Secondary | ICD-10-CM | POA: Diagnosis not present

## 2015-02-12 DIAGNOSIS — Z933 Colostomy status: Secondary | ICD-10-CM | POA: Diagnosis not present

## 2015-02-12 DIAGNOSIS — F0390 Unspecified dementia without behavioral disturbance: Secondary | ICD-10-CM | POA: Diagnosis not present

## 2015-02-13 ENCOUNTER — Telehealth: Payer: Self-pay | Admitting: Family Medicine

## 2015-02-13 DIAGNOSIS — M1990 Unspecified osteoarthritis, unspecified site: Secondary | ICD-10-CM | POA: Diagnosis not present

## 2015-02-13 DIAGNOSIS — A4901 Methicillin susceptible Staphylococcus aureus infection, unspecified site: Secondary | ICD-10-CM

## 2015-02-13 DIAGNOSIS — M869 Osteomyelitis, unspecified: Secondary | ICD-10-CM | POA: Diagnosis not present

## 2015-02-13 DIAGNOSIS — M6281 Muscle weakness (generalized): Secondary | ICD-10-CM | POA: Diagnosis not present

## 2015-02-13 DIAGNOSIS — R262 Difficulty in walking, not elsewhere classified: Secondary | ICD-10-CM | POA: Diagnosis not present

## 2015-02-13 NOTE — Telephone Encounter (Signed)
This patient has been seen recently on several occasions for acute issues.. She is currently wheelchair bound with several chronic problems. I know Dr. Caryl NeverBurchette would agree that this pt would highly benefit if she restarted PT. Ok to send order and forward message as an FYI to Dr. Caryl NeverBurchette?

## 2015-02-13 NOTE — Telephone Encounter (Signed)
PT order faxed. Son is aware.

## 2015-02-13 NOTE — Telephone Encounter (Signed)
That is ok with me  

## 2015-02-13 NOTE — Telephone Encounter (Signed)
Pt son walter is calling [facility] needs order to start patient back on physical therapy due to stiffness in leg and arm. Phone # [phone] fax #[phone]

## 2015-02-14 DIAGNOSIS — M1990 Unspecified osteoarthritis, unspecified site: Secondary | ICD-10-CM | POA: Diagnosis not present

## 2015-02-14 DIAGNOSIS — R262 Difficulty in walking, not elsewhere classified: Secondary | ICD-10-CM | POA: Diagnosis not present

## 2015-02-14 DIAGNOSIS — M869 Osteomyelitis, unspecified: Secondary | ICD-10-CM | POA: Diagnosis not present

## 2015-02-14 DIAGNOSIS — M6281 Muscle weakness (generalized): Secondary | ICD-10-CM | POA: Diagnosis not present

## 2015-02-17 DIAGNOSIS — M1990 Unspecified osteoarthritis, unspecified site: Secondary | ICD-10-CM | POA: Diagnosis not present

## 2015-02-17 DIAGNOSIS — M869 Osteomyelitis, unspecified: Secondary | ICD-10-CM | POA: Diagnosis not present

## 2015-02-17 DIAGNOSIS — R262 Difficulty in walking, not elsewhere classified: Secondary | ICD-10-CM | POA: Diagnosis not present

## 2015-02-17 DIAGNOSIS — M6281 Muscle weakness (generalized): Secondary | ICD-10-CM | POA: Diagnosis not present

## 2015-02-18 DIAGNOSIS — R262 Difficulty in walking, not elsewhere classified: Secondary | ICD-10-CM | POA: Diagnosis not present

## 2015-02-18 DIAGNOSIS — M869 Osteomyelitis, unspecified: Secondary | ICD-10-CM | POA: Diagnosis not present

## 2015-02-18 DIAGNOSIS — M1990 Unspecified osteoarthritis, unspecified site: Secondary | ICD-10-CM | POA: Diagnosis not present

## 2015-02-18 DIAGNOSIS — M6281 Muscle weakness (generalized): Secondary | ICD-10-CM | POA: Diagnosis not present

## 2015-02-19 ENCOUNTER — Encounter (HOSPITAL_BASED_OUTPATIENT_CLINIC_OR_DEPARTMENT_OTHER): Payer: Medicare Other | Attending: Surgery

## 2015-02-19 DIAGNOSIS — Z933 Colostomy status: Secondary | ICD-10-CM | POA: Diagnosis not present

## 2015-02-19 DIAGNOSIS — Z8614 Personal history of Methicillin resistant Staphylococcus aureus infection: Secondary | ICD-10-CM | POA: Diagnosis not present

## 2015-02-19 DIAGNOSIS — M1990 Unspecified osteoarthritis, unspecified site: Secondary | ICD-10-CM | POA: Insufficient documentation

## 2015-02-19 DIAGNOSIS — I10 Essential (primary) hypertension: Secondary | ICD-10-CM | POA: Insufficient documentation

## 2015-02-19 DIAGNOSIS — R262 Difficulty in walking, not elsewhere classified: Secondary | ICD-10-CM | POA: Diagnosis not present

## 2015-02-19 DIAGNOSIS — L89154 Pressure ulcer of sacral region, stage 4: Secondary | ICD-10-CM | POA: Insufficient documentation

## 2015-02-19 DIAGNOSIS — F0390 Unspecified dementia without behavioral disturbance: Secondary | ICD-10-CM | POA: Insufficient documentation

## 2015-02-19 DIAGNOSIS — E440 Moderate protein-calorie malnutrition: Secondary | ICD-10-CM | POA: Diagnosis not present

## 2015-02-19 DIAGNOSIS — M869 Osteomyelitis, unspecified: Secondary | ICD-10-CM | POA: Diagnosis not present

## 2015-02-19 DIAGNOSIS — R32 Unspecified urinary incontinence: Secondary | ICD-10-CM | POA: Diagnosis not present

## 2015-02-19 DIAGNOSIS — M6281 Muscle weakness (generalized): Secondary | ICD-10-CM | POA: Diagnosis not present

## 2015-02-19 DIAGNOSIS — Z96649 Presence of unspecified artificial hip joint: Secondary | ICD-10-CM | POA: Insufficient documentation

## 2015-02-20 DIAGNOSIS — M869 Osteomyelitis, unspecified: Secondary | ICD-10-CM | POA: Diagnosis not present

## 2015-02-20 DIAGNOSIS — R262 Difficulty in walking, not elsewhere classified: Secondary | ICD-10-CM | POA: Diagnosis not present

## 2015-02-20 DIAGNOSIS — M1990 Unspecified osteoarthritis, unspecified site: Secondary | ICD-10-CM | POA: Diagnosis not present

## 2015-02-20 DIAGNOSIS — M6281 Muscle weakness (generalized): Secondary | ICD-10-CM | POA: Diagnosis not present

## 2015-02-21 DIAGNOSIS — R262 Difficulty in walking, not elsewhere classified: Secondary | ICD-10-CM | POA: Diagnosis not present

## 2015-02-21 DIAGNOSIS — M6281 Muscle weakness (generalized): Secondary | ICD-10-CM | POA: Diagnosis not present

## 2015-02-21 DIAGNOSIS — M1990 Unspecified osteoarthritis, unspecified site: Secondary | ICD-10-CM | POA: Diagnosis not present

## 2015-02-21 DIAGNOSIS — M869 Osteomyelitis, unspecified: Secondary | ICD-10-CM | POA: Diagnosis not present

## 2015-02-24 DIAGNOSIS — R262 Difficulty in walking, not elsewhere classified: Secondary | ICD-10-CM | POA: Diagnosis not present

## 2015-02-24 DIAGNOSIS — M869 Osteomyelitis, unspecified: Secondary | ICD-10-CM | POA: Diagnosis not present

## 2015-02-24 DIAGNOSIS — M6281 Muscle weakness (generalized): Secondary | ICD-10-CM | POA: Diagnosis not present

## 2015-02-24 DIAGNOSIS — M1990 Unspecified osteoarthritis, unspecified site: Secondary | ICD-10-CM | POA: Diagnosis not present

## 2015-02-25 DIAGNOSIS — R262 Difficulty in walking, not elsewhere classified: Secondary | ICD-10-CM | POA: Diagnosis not present

## 2015-02-25 DIAGNOSIS — M1990 Unspecified osteoarthritis, unspecified site: Secondary | ICD-10-CM | POA: Diagnosis not present

## 2015-02-25 DIAGNOSIS — M869 Osteomyelitis, unspecified: Secondary | ICD-10-CM | POA: Diagnosis not present

## 2015-02-25 DIAGNOSIS — M6281 Muscle weakness (generalized): Secondary | ICD-10-CM | POA: Diagnosis not present

## 2015-02-26 DIAGNOSIS — M1990 Unspecified osteoarthritis, unspecified site: Secondary | ICD-10-CM | POA: Diagnosis not present

## 2015-02-26 DIAGNOSIS — R262 Difficulty in walking, not elsewhere classified: Secondary | ICD-10-CM | POA: Diagnosis not present

## 2015-02-26 DIAGNOSIS — M869 Osteomyelitis, unspecified: Secondary | ICD-10-CM | POA: Diagnosis not present

## 2015-02-26 DIAGNOSIS — M6281 Muscle weakness (generalized): Secondary | ICD-10-CM | POA: Diagnosis not present

## 2015-02-27 DIAGNOSIS — M1990 Unspecified osteoarthritis, unspecified site: Secondary | ICD-10-CM | POA: Diagnosis not present

## 2015-02-27 DIAGNOSIS — R262 Difficulty in walking, not elsewhere classified: Secondary | ICD-10-CM | POA: Diagnosis not present

## 2015-02-27 DIAGNOSIS — M6281 Muscle weakness (generalized): Secondary | ICD-10-CM | POA: Diagnosis not present

## 2015-02-27 DIAGNOSIS — M869 Osteomyelitis, unspecified: Secondary | ICD-10-CM | POA: Diagnosis not present

## 2015-02-28 DIAGNOSIS — M869 Osteomyelitis, unspecified: Secondary | ICD-10-CM | POA: Diagnosis not present

## 2015-02-28 DIAGNOSIS — M1990 Unspecified osteoarthritis, unspecified site: Secondary | ICD-10-CM | POA: Diagnosis not present

## 2015-02-28 DIAGNOSIS — M6281 Muscle weakness (generalized): Secondary | ICD-10-CM | POA: Diagnosis not present

## 2015-02-28 DIAGNOSIS — R262 Difficulty in walking, not elsewhere classified: Secondary | ICD-10-CM | POA: Diagnosis not present

## 2015-03-03 DIAGNOSIS — R262 Difficulty in walking, not elsewhere classified: Secondary | ICD-10-CM | POA: Diagnosis not present

## 2015-03-03 DIAGNOSIS — M1990 Unspecified osteoarthritis, unspecified site: Secondary | ICD-10-CM | POA: Diagnosis not present

## 2015-03-03 DIAGNOSIS — M6281 Muscle weakness (generalized): Secondary | ICD-10-CM | POA: Diagnosis not present

## 2015-03-03 DIAGNOSIS — M869 Osteomyelitis, unspecified: Secondary | ICD-10-CM | POA: Diagnosis not present

## 2015-03-04 DIAGNOSIS — M1990 Unspecified osteoarthritis, unspecified site: Secondary | ICD-10-CM | POA: Diagnosis not present

## 2015-03-04 DIAGNOSIS — R262 Difficulty in walking, not elsewhere classified: Secondary | ICD-10-CM | POA: Diagnosis not present

## 2015-03-04 DIAGNOSIS — M869 Osteomyelitis, unspecified: Secondary | ICD-10-CM | POA: Diagnosis not present

## 2015-03-04 DIAGNOSIS — M6281 Muscle weakness (generalized): Secondary | ICD-10-CM | POA: Diagnosis not present

## 2015-03-05 DIAGNOSIS — Z8614 Personal history of Methicillin resistant Staphylococcus aureus infection: Secondary | ICD-10-CM | POA: Diagnosis not present

## 2015-03-05 DIAGNOSIS — F0390 Unspecified dementia without behavioral disturbance: Secondary | ICD-10-CM | POA: Diagnosis not present

## 2015-03-05 DIAGNOSIS — M6281 Muscle weakness (generalized): Secondary | ICD-10-CM | POA: Diagnosis not present

## 2015-03-05 DIAGNOSIS — Z96649 Presence of unspecified artificial hip joint: Secondary | ICD-10-CM | POA: Diagnosis not present

## 2015-03-05 DIAGNOSIS — L89154 Pressure ulcer of sacral region, stage 4: Secondary | ICD-10-CM | POA: Diagnosis not present

## 2015-03-05 DIAGNOSIS — E440 Moderate protein-calorie malnutrition: Secondary | ICD-10-CM | POA: Diagnosis not present

## 2015-03-05 DIAGNOSIS — R262 Difficulty in walking, not elsewhere classified: Secondary | ICD-10-CM | POA: Diagnosis not present

## 2015-03-05 DIAGNOSIS — M869 Osteomyelitis, unspecified: Secondary | ICD-10-CM | POA: Diagnosis not present

## 2015-03-05 DIAGNOSIS — I10 Essential (primary) hypertension: Secondary | ICD-10-CM | POA: Diagnosis not present

## 2015-03-05 DIAGNOSIS — Z933 Colostomy status: Secondary | ICD-10-CM | POA: Diagnosis not present

## 2015-03-05 DIAGNOSIS — M8638 Chronic multifocal osteomyelitis, other site: Secondary | ICD-10-CM | POA: Diagnosis not present

## 2015-03-05 DIAGNOSIS — M1990 Unspecified osteoarthritis, unspecified site: Secondary | ICD-10-CM | POA: Diagnosis not present

## 2015-03-06 DIAGNOSIS — M1990 Unspecified osteoarthritis, unspecified site: Secondary | ICD-10-CM | POA: Diagnosis not present

## 2015-03-06 DIAGNOSIS — M869 Osteomyelitis, unspecified: Secondary | ICD-10-CM | POA: Diagnosis not present

## 2015-03-06 DIAGNOSIS — R262 Difficulty in walking, not elsewhere classified: Secondary | ICD-10-CM | POA: Diagnosis not present

## 2015-03-06 DIAGNOSIS — M6281 Muscle weakness (generalized): Secondary | ICD-10-CM | POA: Diagnosis not present

## 2015-03-07 DIAGNOSIS — R262 Difficulty in walking, not elsewhere classified: Secondary | ICD-10-CM | POA: Diagnosis not present

## 2015-03-07 DIAGNOSIS — M6281 Muscle weakness (generalized): Secondary | ICD-10-CM | POA: Diagnosis not present

## 2015-03-07 DIAGNOSIS — M1990 Unspecified osteoarthritis, unspecified site: Secondary | ICD-10-CM | POA: Diagnosis not present

## 2015-03-07 DIAGNOSIS — M869 Osteomyelitis, unspecified: Secondary | ICD-10-CM | POA: Diagnosis not present

## 2015-03-10 DIAGNOSIS — M869 Osteomyelitis, unspecified: Secondary | ICD-10-CM | POA: Diagnosis not present

## 2015-03-10 DIAGNOSIS — R262 Difficulty in walking, not elsewhere classified: Secondary | ICD-10-CM | POA: Diagnosis not present

## 2015-03-10 DIAGNOSIS — M6281 Muscle weakness (generalized): Secondary | ICD-10-CM | POA: Diagnosis not present

## 2015-03-10 DIAGNOSIS — M1990 Unspecified osteoarthritis, unspecified site: Secondary | ICD-10-CM | POA: Diagnosis not present

## 2015-03-11 DIAGNOSIS — M6281 Muscle weakness (generalized): Secondary | ICD-10-CM | POA: Diagnosis not present

## 2015-03-11 DIAGNOSIS — R262 Difficulty in walking, not elsewhere classified: Secondary | ICD-10-CM | POA: Diagnosis not present

## 2015-03-11 DIAGNOSIS — M1990 Unspecified osteoarthritis, unspecified site: Secondary | ICD-10-CM | POA: Diagnosis not present

## 2015-03-11 DIAGNOSIS — M869 Osteomyelitis, unspecified: Secondary | ICD-10-CM | POA: Diagnosis not present

## 2015-03-12 DIAGNOSIS — M869 Osteomyelitis, unspecified: Secondary | ICD-10-CM | POA: Diagnosis not present

## 2015-03-12 DIAGNOSIS — M6281 Muscle weakness (generalized): Secondary | ICD-10-CM | POA: Diagnosis not present

## 2015-03-12 DIAGNOSIS — M1990 Unspecified osteoarthritis, unspecified site: Secondary | ICD-10-CM | POA: Diagnosis not present

## 2015-03-12 DIAGNOSIS — R262 Difficulty in walking, not elsewhere classified: Secondary | ICD-10-CM | POA: Diagnosis not present

## 2015-03-13 DIAGNOSIS — M6281 Muscle weakness (generalized): Secondary | ICD-10-CM | POA: Diagnosis not present

## 2015-03-13 DIAGNOSIS — M869 Osteomyelitis, unspecified: Secondary | ICD-10-CM | POA: Diagnosis not present

## 2015-03-13 DIAGNOSIS — R262 Difficulty in walking, not elsewhere classified: Secondary | ICD-10-CM | POA: Diagnosis not present

## 2015-03-13 DIAGNOSIS — E46 Unspecified protein-calorie malnutrition: Secondary | ICD-10-CM | POA: Diagnosis not present

## 2015-03-13 DIAGNOSIS — M1990 Unspecified osteoarthritis, unspecified site: Secondary | ICD-10-CM | POA: Diagnosis not present

## 2015-03-13 DIAGNOSIS — I10 Essential (primary) hypertension: Secondary | ICD-10-CM | POA: Diagnosis not present

## 2015-03-13 DIAGNOSIS — F0391 Unspecified dementia with behavioral disturbance: Secondary | ICD-10-CM | POA: Diagnosis not present

## 2015-03-13 DIAGNOSIS — L89154 Pressure ulcer of sacral region, stage 4: Secondary | ICD-10-CM | POA: Diagnosis not present

## 2015-03-15 DIAGNOSIS — M6281 Muscle weakness (generalized): Secondary | ICD-10-CM | POA: Diagnosis not present

## 2015-03-15 DIAGNOSIS — M869 Osteomyelitis, unspecified: Secondary | ICD-10-CM | POA: Diagnosis not present

## 2015-03-15 DIAGNOSIS — M1990 Unspecified osteoarthritis, unspecified site: Secondary | ICD-10-CM | POA: Diagnosis not present

## 2015-03-15 DIAGNOSIS — R262 Difficulty in walking, not elsewhere classified: Secondary | ICD-10-CM | POA: Diagnosis not present

## 2015-03-17 DIAGNOSIS — R262 Difficulty in walking, not elsewhere classified: Secondary | ICD-10-CM | POA: Diagnosis not present

## 2015-03-17 DIAGNOSIS — M1990 Unspecified osteoarthritis, unspecified site: Secondary | ICD-10-CM | POA: Diagnosis not present

## 2015-03-17 DIAGNOSIS — M869 Osteomyelitis, unspecified: Secondary | ICD-10-CM | POA: Diagnosis not present

## 2015-03-17 DIAGNOSIS — M6281 Muscle weakness (generalized): Secondary | ICD-10-CM | POA: Diagnosis not present

## 2015-03-18 DIAGNOSIS — M869 Osteomyelitis, unspecified: Secondary | ICD-10-CM | POA: Diagnosis not present

## 2015-03-18 DIAGNOSIS — M1990 Unspecified osteoarthritis, unspecified site: Secondary | ICD-10-CM | POA: Diagnosis not present

## 2015-03-18 DIAGNOSIS — M6281 Muscle weakness (generalized): Secondary | ICD-10-CM | POA: Diagnosis not present

## 2015-03-18 DIAGNOSIS — R262 Difficulty in walking, not elsewhere classified: Secondary | ICD-10-CM | POA: Diagnosis not present

## 2015-03-19 ENCOUNTER — Encounter (HOSPITAL_BASED_OUTPATIENT_CLINIC_OR_DEPARTMENT_OTHER): Payer: Medicare Other | Attending: Surgery

## 2015-03-19 DIAGNOSIS — E440 Moderate protein-calorie malnutrition: Secondary | ICD-10-CM | POA: Diagnosis not present

## 2015-03-19 DIAGNOSIS — M1990 Unspecified osteoarthritis, unspecified site: Secondary | ICD-10-CM | POA: Insufficient documentation

## 2015-03-19 DIAGNOSIS — L89154 Pressure ulcer of sacral region, stage 4: Secondary | ICD-10-CM | POA: Diagnosis not present

## 2015-03-19 DIAGNOSIS — F0390 Unspecified dementia without behavioral disturbance: Secondary | ICD-10-CM | POA: Insufficient documentation

## 2015-03-19 DIAGNOSIS — M869 Osteomyelitis, unspecified: Secondary | ICD-10-CM | POA: Diagnosis not present

## 2015-03-19 DIAGNOSIS — M6281 Muscle weakness (generalized): Secondary | ICD-10-CM | POA: Diagnosis not present

## 2015-03-19 DIAGNOSIS — I10 Essential (primary) hypertension: Secondary | ICD-10-CM | POA: Insufficient documentation

## 2015-03-19 DIAGNOSIS — R262 Difficulty in walking, not elsewhere classified: Secondary | ICD-10-CM | POA: Diagnosis not present

## 2015-03-19 DIAGNOSIS — M8638 Chronic multifocal osteomyelitis, other site: Secondary | ICD-10-CM | POA: Diagnosis not present

## 2015-04-02 DIAGNOSIS — M1990 Unspecified osteoarthritis, unspecified site: Secondary | ICD-10-CM | POA: Diagnosis not present

## 2015-04-02 DIAGNOSIS — L89154 Pressure ulcer of sacral region, stage 4: Secondary | ICD-10-CM | POA: Diagnosis not present

## 2015-04-02 DIAGNOSIS — I10 Essential (primary) hypertension: Secondary | ICD-10-CM | POA: Diagnosis not present

## 2015-04-09 ENCOUNTER — Ambulatory Visit: Payer: Medicare Other | Admitting: Infectious Diseases

## 2015-04-10 DIAGNOSIS — Z438 Encounter for attention to other artificial openings: Secondary | ICD-10-CM | POA: Diagnosis not present

## 2015-04-10 DIAGNOSIS — L89154 Pressure ulcer of sacral region, stage 4: Secondary | ICD-10-CM | POA: Diagnosis not present

## 2015-04-10 DIAGNOSIS — E46 Unspecified protein-calorie malnutrition: Secondary | ICD-10-CM | POA: Diagnosis not present

## 2015-04-10 DIAGNOSIS — F0391 Unspecified dementia with behavioral disturbance: Secondary | ICD-10-CM | POA: Diagnosis not present

## 2015-04-10 DIAGNOSIS — I10 Essential (primary) hypertension: Secondary | ICD-10-CM | POA: Diagnosis not present

## 2015-04-14 ENCOUNTER — Encounter: Payer: Self-pay | Admitting: Family Medicine

## 2015-04-14 ENCOUNTER — Ambulatory Visit (INDEPENDENT_AMBULATORY_CARE_PROVIDER_SITE_OTHER): Payer: Medicare Other | Admitting: Family Medicine

## 2015-04-14 VITALS — BP 120/84 | HR 133 | Temp 97.8°F | Ht 67.0 in | Wt 125.7 lb

## 2015-04-14 DIAGNOSIS — R269 Unspecified abnormalities of gait and mobility: Secondary | ICD-10-CM

## 2015-04-14 DIAGNOSIS — L89154 Pressure ulcer of sacral region, stage 4: Secondary | ICD-10-CM

## 2015-04-14 DIAGNOSIS — I69398 Other sequelae of cerebral infarction: Secondary | ICD-10-CM

## 2015-04-14 DIAGNOSIS — M869 Osteomyelitis, unspecified: Secondary | ICD-10-CM | POA: Diagnosis not present

## 2015-04-14 DIAGNOSIS — R000 Tachycardia, unspecified: Secondary | ICD-10-CM | POA: Diagnosis not present

## 2015-04-14 DIAGNOSIS — R262 Difficulty in walking, not elsewhere classified: Secondary | ICD-10-CM | POA: Diagnosis not present

## 2015-04-14 DIAGNOSIS — M6281 Muscle weakness (generalized): Secondary | ICD-10-CM | POA: Diagnosis not present

## 2015-04-14 DIAGNOSIS — M1990 Unspecified osteoarthritis, unspecified site: Secondary | ICD-10-CM | POA: Diagnosis not present

## 2015-04-14 LAB — CBC WITH DIFFERENTIAL/PLATELET
BASOS PCT: 0.2 % (ref 0.0–3.0)
Basophils Absolute: 0 10*3/uL (ref 0.0–0.1)
EOS PCT: 5.1 % — AB (ref 0.0–5.0)
Eosinophils Absolute: 1.2 10*3/uL — ABNORMAL HIGH (ref 0.0–0.7)
HCT: 47.2 % — ABNORMAL HIGH (ref 36.0–46.0)
Hemoglobin: 15.4 g/dL — ABNORMAL HIGH (ref 12.0–15.0)
LYMPHS ABS: 2.7 10*3/uL (ref 0.7–4.0)
Lymphocytes Relative: 12 % (ref 12.0–46.0)
MCHC: 32.5 g/dL (ref 30.0–36.0)
MCV: 84.7 fl (ref 78.0–100.0)
MONOS PCT: 4 % (ref 3.0–12.0)
Monocytes Absolute: 0.9 10*3/uL (ref 0.1–1.0)
NEUTROS ABS: 17.7 10*3/uL — AB (ref 1.4–7.7)
NEUTROS PCT: 78.7 % — AB (ref 43.0–77.0)
PLATELETS: 333 10*3/uL (ref 150.0–400.0)
RBC: 5.57 Mil/uL — ABNORMAL HIGH (ref 3.87–5.11)
RDW: 15.9 % — AB (ref 11.5–15.5)
WBC: 22.5 10*3/uL (ref 4.0–10.5)

## 2015-04-14 LAB — BASIC METABOLIC PANEL
BUN: 26 mg/dL — AB (ref 6–23)
CO2: 23 meq/L (ref 19–32)
Calcium: 9.3 mg/dL (ref 8.4–10.5)
Chloride: 104 mEq/L (ref 96–112)
Creatinine, Ser: 0.59 mg/dL (ref 0.40–1.20)
GFR: 126.68 mL/min (ref >60.00)
GLUCOSE: 98 mg/dL (ref 70–99)
POTASSIUM: 3.8 meq/L (ref 3.5–5.1)
SODIUM: 139 meq/L (ref 135–145)

## 2015-04-14 LAB — TSH: TSH: 0.79 u[IU]/mL (ref 0.35–4.50)

## 2015-04-14 MED ORDER — METOPROLOL SUCCINATE ER 25 MG PO TB24
25.0000 mg | ORAL_TABLET | Freq: Every day | ORAL | Status: AC
Start: 2015-04-14 — End: ?

## 2015-04-14 NOTE — Progress Notes (Signed)
Subjective:    Patient ID: [name] Frank, female    DOB: [birth year], 81 y.o.   MRN: [id number]  HPI Patient seen for medical follow-up. She has multiple chronic problems including dementia, stage IV decubitus ulcer sacral region with diverting colostomy, hypertension, GERD, hyperlipidemia, history of osteoarthritis, history of osteomyelitis related to her wound above. She is currently in nursing home care. Husband has been frustrated that he does not feel that she is getting the level of care she deserves-at current nursing home.  He wants to look at other home options.    She is still going to wound care regularly. Her blood pressure apparently has been well controlled. She is eating fairly well and her weight is actually up slightly from last visit. No reported fevers.  She has advanced dementia and has become less communicative during visits- but appears to be in no distress.  Past Medical History  Diagnosis Date  . Allergy   . Hyperlipidemia   . Hypertension   . Anxiety   . GERD (gastroesophageal reflux disease)   . Urinary frequency   . Neurogenic urinary incontinence     since back surgery  . Arthritis     degenerative oateoarthritis  . Decubital ulcer 11/14/2014  . Dementia    Past Surgical History  Procedure Laterality Date  . Appendectomy    . Cholecystectomy    . Abdominal hysterectomy    . Back surgery  2000  . Colonoscopy w/ polypectomy    . Breast surgery  1993    biopsy  . Total hip arthroplasty  01/07/2012    Procedure: TOTAL HIP ARTHROPLASTY;  Surgeon: Yvette Rack., MD;  Location: [place];  Service: Orthopedics;  Laterality: Right;  . Bone marrow biopsy N/A 11/22/2014    Procedure: SACRAL BONE BIOPSY PRONE POSITION;  Surgeon: Georganna Skeans, MD;  Location: [place];  Service: General;  Laterality: N/A;  . Ileo loop colostomy closure N/A 11/22/2014    Procedure: LAPAROSCOPIC LOOP COLOSTOMY ;  Surgeon: Georganna Skeans, MD;  Location: [place];  Service: General;   Laterality: N/A;    reports that she quit smoking about 31 years ago. Her smoking use included Cigarettes. She has a 5 pack-year smoking history. She has never used smokeless tobacco. She reports that she does not drink alcohol or use illicit drugs. family history includes Diabetes in her mother. Allergies  Allergen Reactions  . Codeine Sulfate Other (See Comments)    stomach pains  . Tramadol Hcl Other (See Comments)    stomach cramps      Review of Systems  Constitutional: Positive for fatigue.   not obtainable from patient secondary to dementia. No reported fevers,vomiting, complaints of pain.      Objective:   Physical Exam  Constitutional: She appears well-developed and well-nourished.  HENT:  Mouth/Throat: Oropharynx is clear and moist.  Neck: Neck supple.  Cardiovascular:  She is tachycardic but regular. Pulse varies 124 and 130  Pulmonary/Chest: Effort normal and breath sounds normal. No respiratory distress. She has no wheezes. She has no rales.  Abdominal: Soft. There is no tenderness.  Musculoskeletal: She exhibits no edema.  Skin: No pallor.  She has wound vac in place and we did not look at would today as she is followed regularly by wound care center.          Assessment & Plan:  #1 hypertension. Stable and at goal  #2 tachycardia. She's not any fever. Clinically does not appear dehydrated.  No history of recent anemia. No recent CBC. She has very regular rhythm. Check further labs with basic metabolic panel, CBC, TSH We are unable to get her positioned for EKG.  #3 complicated stage IV decubitus ulcer of the sacrum. Continue follow-up with wound care center History of osteomyelitis and high risk for infectious complications.   WBC came back 22K.  With elevated pulse above- even though afebrile and BP stable we have recommended to family evaluation in ED and possible admission.  Discussions regarding palliative care have been discussed with family in past  and should be re-addressed soon.

## 2015-04-14 NOTE — Progress Notes (Signed)
Pre visit review using our clinic review tool, if applicable. No additional management support is needed unless otherwise documented below in the visit note. 

## 2015-04-15 DIAGNOSIS — M6281 Muscle weakness (generalized): Secondary | ICD-10-CM | POA: Diagnosis not present

## 2015-04-15 DIAGNOSIS — M1990 Unspecified osteoarthritis, unspecified site: Secondary | ICD-10-CM | POA: Diagnosis not present

## 2015-04-15 DIAGNOSIS — M869 Osteomyelitis, unspecified: Secondary | ICD-10-CM | POA: Diagnosis not present

## 2015-04-15 DIAGNOSIS — R262 Difficulty in walking, not elsewhere classified: Secondary | ICD-10-CM | POA: Diagnosis not present

## 2015-04-15 DIAGNOSIS — Z79899 Other long term (current) drug therapy: Secondary | ICD-10-CM | POA: Diagnosis not present

## 2015-04-16 ENCOUNTER — Encounter (HOSPITAL_BASED_OUTPATIENT_CLINIC_OR_DEPARTMENT_OTHER): Payer: Medicare Other | Attending: Surgery

## 2015-04-16 DIAGNOSIS — Z933 Colostomy status: Secondary | ICD-10-CM | POA: Diagnosis not present

## 2015-04-16 DIAGNOSIS — F0390 Unspecified dementia without behavioral disturbance: Secondary | ICD-10-CM | POA: Diagnosis not present

## 2015-04-16 DIAGNOSIS — L89154 Pressure ulcer of sacral region, stage 4: Secondary | ICD-10-CM | POA: Diagnosis not present

## 2015-04-16 DIAGNOSIS — I10 Essential (primary) hypertension: Secondary | ICD-10-CM | POA: Insufficient documentation

## 2015-04-16 DIAGNOSIS — M869 Osteomyelitis, unspecified: Secondary | ICD-10-CM | POA: Diagnosis not present

## 2015-04-16 DIAGNOSIS — M1990 Unspecified osteoarthritis, unspecified site: Secondary | ICD-10-CM | POA: Diagnosis not present

## 2015-04-16 DIAGNOSIS — Z96649 Presence of unspecified artificial hip joint: Secondary | ICD-10-CM | POA: Insufficient documentation

## 2015-04-16 DIAGNOSIS — R262 Difficulty in walking, not elsewhere classified: Secondary | ICD-10-CM | POA: Diagnosis not present

## 2015-04-16 DIAGNOSIS — M6281 Muscle weakness (generalized): Secondary | ICD-10-CM | POA: Diagnosis not present

## 2015-04-17 DIAGNOSIS — R262 Difficulty in walking, not elsewhere classified: Secondary | ICD-10-CM | POA: Diagnosis not present

## 2015-04-17 DIAGNOSIS — M869 Osteomyelitis, unspecified: Secondary | ICD-10-CM | POA: Diagnosis not present

## 2015-04-17 DIAGNOSIS — M1990 Unspecified osteoarthritis, unspecified site: Secondary | ICD-10-CM | POA: Diagnosis not present

## 2015-04-17 DIAGNOSIS — M6281 Muscle weakness (generalized): Secondary | ICD-10-CM | POA: Diagnosis not present

## 2015-04-18 DIAGNOSIS — M6281 Muscle weakness (generalized): Secondary | ICD-10-CM | POA: Diagnosis not present

## 2015-04-18 DIAGNOSIS — M1990 Unspecified osteoarthritis, unspecified site: Secondary | ICD-10-CM | POA: Diagnosis not present

## 2015-04-18 DIAGNOSIS — R262 Difficulty in walking, not elsewhere classified: Secondary | ICD-10-CM | POA: Diagnosis not present

## 2015-04-18 DIAGNOSIS — M869 Osteomyelitis, unspecified: Secondary | ICD-10-CM | POA: Diagnosis not present

## 2015-04-21 DIAGNOSIS — M1990 Unspecified osteoarthritis, unspecified site: Secondary | ICD-10-CM | POA: Diagnosis not present

## 2015-04-21 DIAGNOSIS — M869 Osteomyelitis, unspecified: Secondary | ICD-10-CM | POA: Diagnosis not present

## 2015-04-21 DIAGNOSIS — R262 Difficulty in walking, not elsewhere classified: Secondary | ICD-10-CM | POA: Diagnosis not present

## 2015-04-21 DIAGNOSIS — M6281 Muscle weakness (generalized): Secondary | ICD-10-CM | POA: Diagnosis not present

## 2015-04-22 DIAGNOSIS — R262 Difficulty in walking, not elsewhere classified: Secondary | ICD-10-CM | POA: Diagnosis not present

## 2015-04-22 DIAGNOSIS — M6281 Muscle weakness (generalized): Secondary | ICD-10-CM | POA: Diagnosis not present

## 2015-04-22 DIAGNOSIS — M869 Osteomyelitis, unspecified: Secondary | ICD-10-CM | POA: Diagnosis not present

## 2015-04-22 DIAGNOSIS — M1990 Unspecified osteoarthritis, unspecified site: Secondary | ICD-10-CM | POA: Diagnosis not present

## 2015-04-23 DIAGNOSIS — R262 Difficulty in walking, not elsewhere classified: Secondary | ICD-10-CM | POA: Diagnosis not present

## 2015-04-23 DIAGNOSIS — M1990 Unspecified osteoarthritis, unspecified site: Secondary | ICD-10-CM | POA: Diagnosis not present

## 2015-04-23 DIAGNOSIS — M869 Osteomyelitis, unspecified: Secondary | ICD-10-CM | POA: Diagnosis not present

## 2015-04-23 DIAGNOSIS — M6281 Muscle weakness (generalized): Secondary | ICD-10-CM | POA: Diagnosis not present

## 2015-04-24 DIAGNOSIS — M869 Osteomyelitis, unspecified: Secondary | ICD-10-CM | POA: Diagnosis not present

## 2015-04-24 DIAGNOSIS — M6281 Muscle weakness (generalized): Secondary | ICD-10-CM | POA: Diagnosis not present

## 2015-04-24 DIAGNOSIS — R262 Difficulty in walking, not elsewhere classified: Secondary | ICD-10-CM | POA: Diagnosis not present

## 2015-04-24 DIAGNOSIS — M1990 Unspecified osteoarthritis, unspecified site: Secondary | ICD-10-CM | POA: Diagnosis not present

## 2015-04-25 DIAGNOSIS — M1990 Unspecified osteoarthritis, unspecified site: Secondary | ICD-10-CM | POA: Diagnosis not present

## 2015-04-25 DIAGNOSIS — M6281 Muscle weakness (generalized): Secondary | ICD-10-CM | POA: Diagnosis not present

## 2015-04-25 DIAGNOSIS — M869 Osteomyelitis, unspecified: Secondary | ICD-10-CM | POA: Diagnosis not present

## 2015-04-25 DIAGNOSIS — R262 Difficulty in walking, not elsewhere classified: Secondary | ICD-10-CM | POA: Diagnosis not present

## 2015-04-28 DIAGNOSIS — Z79899 Other long term (current) drug therapy: Secondary | ICD-10-CM | POA: Diagnosis not present

## 2015-04-28 DIAGNOSIS — M6281 Muscle weakness (generalized): Secondary | ICD-10-CM | POA: Diagnosis not present

## 2015-04-28 DIAGNOSIS — R262 Difficulty in walking, not elsewhere classified: Secondary | ICD-10-CM | POA: Diagnosis not present

## 2015-04-28 DIAGNOSIS — M869 Osteomyelitis, unspecified: Secondary | ICD-10-CM | POA: Diagnosis not present

## 2015-04-28 DIAGNOSIS — M1990 Unspecified osteoarthritis, unspecified site: Secondary | ICD-10-CM | POA: Diagnosis not present

## 2015-04-29 ENCOUNTER — Inpatient Hospital Stay (HOSPITAL_COMMUNITY)
Admission: EM | Admit: 2015-04-29 | Discharge: 2015-05-02 | DRG: 592 | Disposition: A | Discharge status: Skilled Nursing Facility | Payer: Medicare Other | Attending: Internal Medicine | Admitting: Internal Medicine

## 2015-04-29 ENCOUNTER — Emergency Department (HOSPITAL_COMMUNITY): Payer: Medicare Other

## 2015-04-29 ENCOUNTER — Encounter (HOSPITAL_COMMUNITY): Payer: Self-pay | Admitting: *Deleted

## 2015-04-29 DIAGNOSIS — Z833 Family history of diabetes mellitus: Secondary | ICD-10-CM | POA: Diagnosis not present

## 2015-04-29 DIAGNOSIS — Z8614 Personal history of Methicillin resistant Staphylococcus aureus infection: Secondary | ICD-10-CM | POA: Diagnosis not present

## 2015-04-29 DIAGNOSIS — E440 Moderate protein-calorie malnutrition: Secondary | ICD-10-CM | POA: Insufficient documentation

## 2015-04-29 DIAGNOSIS — Z96641 Presence of right artificial hip joint: Secondary | ICD-10-CM | POA: Diagnosis not present

## 2015-04-29 DIAGNOSIS — Z823 Family history of stroke: Secondary | ICD-10-CM | POA: Diagnosis not present

## 2015-04-29 DIAGNOSIS — M869 Osteomyelitis, unspecified: Secondary | ICD-10-CM | POA: Diagnosis not present

## 2015-04-29 DIAGNOSIS — F419 Anxiety disorder, unspecified: Secondary | ICD-10-CM | POA: Diagnosis present

## 2015-04-29 DIAGNOSIS — R262 Difficulty in walking, not elsewhere classified: Secondary | ICD-10-CM | POA: Diagnosis not present

## 2015-04-29 DIAGNOSIS — L089 Local infection of the skin and subcutaneous tissue, unspecified: Secondary | ICD-10-CM

## 2015-04-29 DIAGNOSIS — Z8249 Family history of ischemic heart disease and other diseases of the circulatory system: Secondary | ICD-10-CM | POA: Diagnosis not present

## 2015-04-29 DIAGNOSIS — T148XXA Other injury of unspecified body region, initial encounter: Secondary | ICD-10-CM

## 2015-04-29 DIAGNOSIS — Z9181 History of falling: Secondary | ICD-10-CM | POA: Diagnosis not present

## 2015-04-29 DIAGNOSIS — F0390 Unspecified dementia without behavioral disturbance: Secondary | ICD-10-CM | POA: Diagnosis present

## 2015-04-29 DIAGNOSIS — Z79899 Other long term (current) drug therapy: Secondary | ICD-10-CM

## 2015-04-29 DIAGNOSIS — Z933 Colostomy status: Secondary | ICD-10-CM | POA: Diagnosis not present

## 2015-04-29 DIAGNOSIS — D72829 Elevated white blood cell count, unspecified: Secondary | ICD-10-CM | POA: Diagnosis not present

## 2015-04-29 DIAGNOSIS — R509 Fever, unspecified: Secondary | ICD-10-CM | POA: Diagnosis not present

## 2015-04-29 DIAGNOSIS — L89154 Pressure ulcer of sacral region, stage 4: Secondary | ICD-10-CM | POA: Diagnosis present

## 2015-04-29 DIAGNOSIS — J9811 Atelectasis: Secondary | ICD-10-CM | POA: Diagnosis not present

## 2015-04-29 DIAGNOSIS — B999 Unspecified infectious disease: Secondary | ICD-10-CM | POA: Diagnosis not present

## 2015-04-29 DIAGNOSIS — R627 Adult failure to thrive: Secondary | ICD-10-CM | POA: Diagnosis not present

## 2015-04-29 DIAGNOSIS — M868X8 Other osteomyelitis, other site: Secondary | ICD-10-CM | POA: Diagnosis not present

## 2015-04-29 DIAGNOSIS — R488 Other symbolic dysfunctions: Secondary | ICD-10-CM | POA: Diagnosis not present

## 2015-04-29 DIAGNOSIS — R4182 Altered mental status, unspecified: Secondary | ICD-10-CM | POA: Diagnosis present

## 2015-04-29 DIAGNOSIS — R7881 Bacteremia: Secondary | ICD-10-CM | POA: Diagnosis not present

## 2015-04-29 DIAGNOSIS — Z681 Body mass index (BMI) 19 or less, adult: Secondary | ICD-10-CM

## 2015-04-29 DIAGNOSIS — S31000D Unspecified open wound of lower back and pelvis without penetration into retroperitoneum, subsequent encounter: Secondary | ICD-10-CM | POA: Diagnosis not present

## 2015-04-29 DIAGNOSIS — R5381 Other malaise: Secondary | ICD-10-CM | POA: Diagnosis not present

## 2015-04-29 DIAGNOSIS — E785 Hyperlipidemia, unspecified: Secondary | ICD-10-CM | POA: Diagnosis present

## 2015-04-29 DIAGNOSIS — M1990 Unspecified osteoarthritis, unspecified site: Secondary | ICD-10-CM | POA: Diagnosis not present

## 2015-04-29 DIAGNOSIS — R2689 Other abnormalities of gait and mobility: Secondary | ICD-10-CM | POA: Diagnosis not present

## 2015-04-29 DIAGNOSIS — T148 Other injury of unspecified body region: Secondary | ICD-10-CM | POA: Diagnosis not present

## 2015-04-29 DIAGNOSIS — A4901 Methicillin susceptible Staphylococcus aureus infection, unspecified site: Secondary | ICD-10-CM

## 2015-04-29 DIAGNOSIS — I10 Essential (primary) hypertension: Secondary | ICD-10-CM | POA: Diagnosis present

## 2015-04-29 DIAGNOSIS — Z87891 Personal history of nicotine dependence: Secondary | ICD-10-CM | POA: Diagnosis not present

## 2015-04-29 DIAGNOSIS — M6281 Muscle weakness (generalized): Secondary | ICD-10-CM | POA: Diagnosis not present

## 2015-04-29 DIAGNOSIS — R1319 Other dysphagia: Secondary | ICD-10-CM | POA: Diagnosis not present

## 2015-04-29 LAB — COMPREHENSIVE METABOLIC PANEL
ALBUMIN: 2.9 g/dL — AB (ref 3.5–5.0)
ALK PHOS: 59 U/L (ref 38–126)
ALT: 22 U/L (ref 14–54)
ANION GAP: 12 (ref 5–15)
AST: 45 U/L — ABNORMAL HIGH (ref 15–41)
BILIRUBIN TOTAL: 0.6 mg/dL (ref 0.3–1.2)
BUN: 39 mg/dL — ABNORMAL HIGH (ref 6–20)
CALCIUM: 9.1 mg/dL (ref 8.9–10.3)
CO2: 23 mmol/L (ref 22–32)
Chloride: 108 mmol/L (ref 101–111)
Creatinine, Ser: 0.91 mg/dL (ref 0.44–1.00)
GFR calc non Af Amer: 59 mL/min — ABNORMAL LOW (ref >60)
GLUCOSE: 113 mg/dL — AB (ref 65–99)
Potassium: 3.7 mmol/L (ref 3.5–5.1)
Sodium: 143 mmol/L (ref 135–145)
TOTAL PROTEIN: 6.2 g/dL — AB (ref 6.5–8.1)

## 2015-04-29 LAB — URINALYSIS, ROUTINE W REFLEX MICROSCOPIC
BILIRUBIN URINE: NEGATIVE
Glucose, UA: NEGATIVE mg/dL
Hgb urine dipstick: NEGATIVE
KETONES UR: NEGATIVE mg/dL
NITRITE: NEGATIVE
PROTEIN: NEGATIVE mg/dL
SPECIFIC GRAVITY, URINE: 1.019 (ref 1.005–1.030)
pH: 5 (ref 5.0–8.0)

## 2015-04-29 LAB — WET PREP, GENITAL
Clue Cells Wet Prep HPF POC: NONE SEEN
Sperm: NONE SEEN
TRICH WET PREP: NONE SEEN
YEAST WET PREP: NONE SEEN

## 2015-04-29 LAB — I-STAT CG4 LACTIC ACID, ED
Lactic Acid, Venous: 2.64 mmol/L (ref 0.5–2.0)
Lactic Acid, Venous: 2.93 mmol/L (ref 0.5–2.0)

## 2015-04-29 LAB — CBC WITH DIFFERENTIAL/PLATELET
BASOS ABS: 0 10*3/uL (ref 0.0–0.1)
Basophils Relative: 0 %
EOS ABS: 0.9 10*3/uL — AB (ref 0.0–0.7)
Eosinophils Relative: 5 %
HCT: 43.9 % (ref 36.0–46.0)
HEMOGLOBIN: 14.6 g/dL (ref 12.0–15.0)
LYMPHS PCT: 11 %
Lymphs Abs: 2 10*3/uL (ref 0.7–4.0)
MCH: 28.9 pg (ref 26.0–34.0)
MCHC: 33.3 g/dL (ref 30.0–36.0)
MCV: 86.8 fL (ref 78.0–100.0)
MONO ABS: 0.9 10*3/uL (ref 0.1–1.0)
Monocytes Relative: 5 %
NEUTROS ABS: 14.3 10*3/uL — AB (ref 1.7–7.7)
Neutrophils Relative %: 79 %
PLATELETS: 212 10*3/uL (ref 150–400)
RBC: 5.06 MIL/uL (ref 3.87–5.11)
RDW: 16.6 % — AB (ref 11.5–15.5)
WBC: 18.1 10*3/uL — ABNORMAL HIGH (ref 4.0–10.5)

## 2015-04-29 LAB — URINE MICROSCOPIC-ADD ON: RBC / HPF: NONE SEEN RBC/hpf (ref 0–5)

## 2015-04-29 MED ORDER — VANCOMYCIN HCL IN DEXTROSE 1-5 GM/200ML-% IV SOLN
1000.0000 mg | Freq: Once | INTRAVENOUS | Status: AC
  Administered 2015-04-29: 1000 mg via INTRAVENOUS
  Filled 2015-04-29: qty 200

## 2015-04-29 MED ORDER — METOPROLOL SUCCINATE ER 25 MG PO TB24
25.0000 mg | ORAL_TABLET | Freq: Every day | ORAL | Status: DC
  Administered 2015-04-30 – 2015-05-02 (×3): 25 mg via ORAL
  Filled 2015-04-29 (×3): qty 1

## 2015-04-29 MED ORDER — DONEPEZIL HCL 10 MG PO TABS
10.0000 mg | ORAL_TABLET | Freq: Every day | ORAL | Status: DC
  Administered 2015-04-30 – 2015-05-01 (×2): 10 mg via ORAL
  Filled 2015-04-29 (×2): qty 1

## 2015-04-29 MED ORDER — SODIUM CHLORIDE 0.9 % IV BOLUS (SEPSIS)
1000.0000 mL | INTRAVENOUS | Status: AC
  Administered 2015-04-29 (×2): 1000 mL via INTRAVENOUS

## 2015-04-29 MED ORDER — AMLODIPINE BESYLATE 5 MG PO TABS
5.0000 mg | ORAL_TABLET | Freq: Every day | ORAL | Status: DC
  Administered 2015-04-30 – 2015-05-02 (×3): 5 mg via ORAL
  Filled 2015-04-29 (×3): qty 1

## 2015-04-29 MED ORDER — VANCOMYCIN HCL IN DEXTROSE 750-5 MG/150ML-% IV SOLN
750.0000 mg | Freq: Two times a day (BID) | INTRAVENOUS | Status: DC
  Administered 2015-04-30 – 2015-05-01 (×3): 750 mg via INTRAVENOUS
  Filled 2015-04-29 (×4): qty 150

## 2015-04-29 MED ORDER — ENOXAPARIN SODIUM 40 MG/0.4ML ~~LOC~~ SOLN
40.0000 mg | Freq: Every day | SUBCUTANEOUS | Status: DC
  Administered 2015-04-30 – 2015-05-02 (×3): 40 mg via SUBCUTANEOUS
  Filled 2015-04-29 (×3): qty 0.4

## 2015-04-29 MED ORDER — BOOST / RESOURCE BREEZE PO LIQD
1.0000 | Freq: Three times a day (TID) | ORAL | Status: DC
  Administered 2015-04-30 (×2): 1 via ORAL
  Filled 2015-04-29 (×5): qty 1

## 2015-04-29 MED ORDER — PRO-STAT SUGAR FREE PO LIQD
30.0000 mL | Freq: Three times a day (TID) | ORAL | Status: DC
  Administered 2015-04-30 – 2015-05-02 (×7): 30 mL via ORAL
  Filled 2015-04-29 (×5): qty 30

## 2015-04-29 MED ORDER — LOSARTAN POTASSIUM 50 MG PO TABS
50.0000 mg | ORAL_TABLET | Freq: Every day | ORAL | Status: DC
  Administered 2015-04-30 – 2015-05-02 (×3): 50 mg via ORAL
  Filled 2015-04-29 (×3): qty 1

## 2015-04-29 MED ORDER — MEGESTROL ACETATE 40 MG/ML PO SUSP
200.0000 mg | Freq: Two times a day (BID) | ORAL | Status: DC
Start: 2015-04-30 — End: 2015-05-02
  Administered 2015-04-30 – 2015-05-02 (×6): 200 mg via ORAL
  Filled 2015-04-29 (×7): qty 5

## 2015-04-29 MED ORDER — SODIUM CHLORIDE 0.9 % IV SOLN
INTRAVENOUS | Status: DC
Start: 2015-04-29 — End: 2015-05-02
  Administered 2015-04-29: via INTRAVENOUS
  Administered 2015-04-30: 100 mL/h via INTRAVENOUS
  Administered 2015-05-01: 16:00:00 via INTRAVENOUS

## 2015-04-29 NOTE — Consult Note (Signed)
Pharmacy Antibiotic Note  [name] Frank is a 81 y.o. female admitted on 04/29/2015 with wound infection.  Pharmacy has been consulted for vancomycin dosing.  Per [facility], pt was diagnosed with osteomyelitis due to staph. Pt has a history of 1/2 blood cultures positive for MRSA in September 2016.  Plan: Vancomycin IV 1 g x1, then 750 mg IV q12h F/u cultures, renal function, VT prn, LOT, clinical progression  Weight: 125 lb 10.6 oz (57 kg)  Temp (24hrs), Avg:99.3 F (37.4 C), Min:98.7 F (37.1 C), Max:99.8 F (37.7 C)   Recent Labs Lab 04/29/15 1715 04/29/15 1721  WBC  --  18.1*  CREATININE  --  0.91  LATICACIDVEN 2.64*  --     Estimated Creatinine Clearance: 45.8 mL/min (by C-G formula based on Cr of 0.91).    Allergies  Allergen Reactions  . Augmentin [Amoxicillin-Pot Clavulanate]     Per MAR  . Codeine Sulfate Other (See Comments)    stomach pains  . Tramadol Hcl Other (See Comments)    stomach cramps    Antimicrobials this admission: 2/14 Vanc >>   Dose adjustments this admission: n/a  Microbiology results: 2/14 BCx:  2/14 UCx:     Thank you for allowing pharmacy to be a part of this patient's care.  Annamary Carolin 04/29/2015 7:45 PM

## 2015-04-29 NOTE — ED Provider Notes (Signed)
CSN: [id number]     Arrival date & time 04/29/15  1546 History   First MD Initiated Contact with Patient 04/29/15 1605     Chief Complaint  Patient presents with  . Wound Infection     (Consider location/radiation/quality/duration/timing/severity/associated sxs/prior Treatment) HPI   [name] Frank is a 81 y.o. female who presents for evaluation of fever, and elevated white blood cell count. This is reported. The patient's husband yesterday, by phone, and she was sent here today for evaluation, from her skilled nursing facility. Patient is being treated actively with a wound VAC for a coccyx infection, and is due to see the wound care clinic, tomorrow. She was treated with antibiotics 2 weeks ago completing a course of doxycycline about 4 days ago. Apparently there have been started because of an elevated white blood cell count. Patient is unable to give history. His sister-in-law states she is a little more sleepy today than usual, however she is able to respond and states that she is not in pain.  Level V caveat- altered mental status  Past Medical History  Diagnosis Date  . Allergy   . Hyperlipidemia   . Hypertension   . Anxiety   . GERD (gastroesophageal reflux disease)   . Urinary frequency   . Neurogenic urinary incontinence     since back surgery  . Arthritis     degenerative oateoarthritis  . Decubital ulcer 11/14/2014  . Dementia    Past Surgical History  Procedure Laterality Date  . Appendectomy    . Cholecystectomy    . Abdominal hysterectomy    . Back surgery  2000  . Colonoscopy w/ polypectomy    . Breast surgery  1993    biopsy  . Total hip arthroplasty  01/07/2012    Procedure: TOTAL HIP ARTHROPLASTY;  Surgeon: Yvette Rack., MD;  Location: [place];  Service: Orthopedics;  Laterality: Right;  . Bone marrow biopsy N/A 11/22/2014    Procedure: SACRAL BONE BIOPSY PRONE POSITION;  Surgeon: Georganna Skeans, MD;  Location: [place];  Service: General;  Laterality:  N/A;  . Ileo loop colostomy closure N/A 11/22/2014    Procedure: LAPAROSCOPIC LOOP COLOSTOMY ;  Surgeon: Georganna Skeans, MD;  Location[facility];  Service: General;  Laterality: N/A;   Family History  Problem Relation Age of Onset  . Diabetes Mother   . Alcohol abuse      fhx  . Arthritis      fhx  . Hypertension      fhx  . Heart disease      fhx  . Cancer      colon, lung, prostate/fhx  . Stroke      fhx  . Depression      fhx   Social History  Substance Use Topics  . Smoking status: Former Smoker -- 0.50 packs/day for 10 years    Types: Cigarettes    Quit date: 09/23/1983  . Smokeless tobacco: Never Used  . Alcohol Use: No   OB History    No data available     Review of Systems  Unable to perform ROS: Mental status change      Allergies  Augmentin; Codeine sulfate; and Tramadol hcl  Home Medications   Prior to Admission medications   Medication Sig Start Date End Date Taking? Authorizing Provider  Amino Acids-Protein Hydrolys (FEEDING SUPPLEMENT, PRO-STAT SUGAR FREE 64,) LIQD Take 30 mLs by mouth 3 (three) times daily with meals.   Yes Historical Provider, MD  amLODipine (NORVASC) 5 MG tablet Take 5 mg by mouth daily.   Yes Historical Provider, MD  donepezil (ARICEPT) 10 MG tablet Take 1 tablet (10 mg total) by mouth at bedtime. 07/19/14  Yes Eulas Post, MD  feeding supplement (BOOST / RESOURCE BREEZE) LIQD Take 1 Container by mouth 3 (three) times daily between meals. 11/26/14  Yes Janece Canterbury, MD  furosemide (LASIX) 20 MG tablet TAKE 1 TABLET (20 MG TOTAL) BY MOUTH DAILY. 10/02/14  Yes Eulas Post, MD  losartan (COZAAR) 50 MG tablet TAKE 1 TABLET BY MOUTH DAILY Patient taking differently: TAKE 50 MG BY MOUTH DAILY 01/14/14  Yes Eulas Post, MD  megestrol (MEGACE ORAL) 40 MG/ML suspension One tsp po bid Patient taking differently: Take 200 mg by mouth 2 (two) times daily.  06/19/14  Yes Eulas Post, MD  metoprolol succinate (TOPROL-XL) 25  MG 24 hr tablet Take 1 tablet (25 mg total) by mouth daily. 04/14/15  Yes Eulas Post, MD  Multiple Vitamins-Minerals (CERTAGEN) tablet Take 1 tablet by mouth daily.   Yes Historical Provider, MD  vitamin B-12 (CYANOCOBALAMIN) 500 MCG tablet Take 500 mcg by mouth daily.   Yes Historical Provider, MD  vitamin C (VITAMIN C) 500 MG tablet Take 1 tablet (500 mg total) by mouth 2 (two) times daily. 11/26/14  Yes Janece Canterbury, MD   BP 149/83 mmHg  Pulse 83  Temp(Src) 98.7 F (37.1 C) (Rectal)  Resp 25  Wt 125 lb 10.6 oz (57 kg)  SpO2 100% Physical Exam  Constitutional: She appears well-developed.  Elderly, frail  HENT:  Head: Normocephalic and atraumatic.  Right Ear: External ear normal.  Left Ear: External ear normal.  Oral mucous members are dry.  Eyes: Conjunctivae and EOM are normal. Pupils are equal, round, and reactive to light. Right eye exhibits no discharge. Left eye exhibits no discharge.  Neck: Normal range of motion and phonation normal. Neck supple.  Cardiovascular: Normal rate, regular rhythm and normal heart sounds.   Pulmonary/Chest: Effort normal and breath sounds normal. She exhibits no bony tenderness.  Abdominal: Soft. She exhibits no distension. There is no tenderness. There is no rebound.  Ostomy in left lower quadrant, device and site appeared normal.  Genitourinary:  Small amount of opaque vaginal discharge, noted on the perineum. Anus appears normal.  Musculoskeletal:  Arms and legs are somewhat stiff bilaterally, legs more than arms, but I am able to range them without significant pain.  Neurological: She is alert. No cranial nerve deficit or sensory deficit. She exhibits normal muscle tone. Coordination normal.  Alert, responsive, confused, no dysarthria.  Skin: Skin is warm, dry and intact.  Wound VAC device, on coccyx, with drainage tube on left buttocks. There is an adherent translucent dressing extending from the left buttocks to the middle of the  right buttocks. There is a small amount of skin breakdown in the medial right buttocks, which is nonspecific in nature.  Psychiatric: She has a normal mood and affect. Her behavior is normal.  Nursing note and vitals reviewed.   ED Course  Procedures (including critical care time)  Medications  vancomycin (VANCOCIN) IVPB 750 mg/150 ml premix (not administered)  0.9 %  sodium chloride infusion ( Intravenous Transfusing/Transfer 04/30/15 0010)  enoxaparin (LOVENOX) injection 40 mg (not administered)  amLODipine (NORVASC) tablet 5 mg (not administered)  donepezil (ARICEPT) tablet 10 mg (not administered)  feeding supplement (PRO-STAT SUGAR FREE 64) liquid 30 mL (not administered)  feeding supplement (BOOST /  RESOURCE BREEZE) liquid 1 Container (not administered)  losartan (COZAAR) tablet 50 mg (not administered)  metoprolol succinate (TOPROL-XL) 24 hr tablet 25 mg (not administered)  megestrol (MEGACE) 40 MG/ML suspension 200 mg (not administered)  sodium chloride 0.9 % bolus 1,000 mL (0 mLs Intravenous Stopped 04/29/15 1939)  vancomycin (VANCOCIN) IVPB 1000 mg/200 mL premix (0 mg Intravenous Stopped 04/29/15 2139)    Patient Vitals for the past 24 hrs:  BP Temp Temp src Pulse Resp SpO2 Weight  04/29/15 2345 149/83 mmHg - - 83 25 100 % -  04/29/15 2315 135/89 mmHg - - 81 23 100 % -  04/29/15 2130 142/85 mmHg - - 78 21 100 % -  04/29/15 2115 136/86 mmHg - - 76 22 100 % -  04/29/15 2100 129/87 mmHg - - 76 24 100 % -  04/29/15 2015 135/86 mmHg - - 92 23 100 % -  04/29/15 2000 131/88 mmHg - - 81 18 100 % -  04/29/15 1940 - 98.7 F (37.1 C) Rectal - - - 125 lb 10.6 oz (57 kg)  04/29/15 1930 127/92 mmHg - - 78 20 100 % -  04/29/15 1900 122/74 mmHg - - 81 24 100 % -  04/29/15 1830 127/72 mmHg - - 79 26 100 % -  04/29/15 1815 134/73 mmHg - - 90 23 100 % -  04/29/15 1745 122/77 mmHg - - 81 19 100 % -  04/29/15 1730 120/77 mmHg - - 88 24 99 % -  04/29/15 1715 117/80 mmHg - - 92 21 100 % -   04/29/15 1645 119/74 mmHg - - 104 26 98 % -  04/29/15 1630 122/79 mmHg - - 108 (!) 28 99 % -  04/29/15 1615 121/78 mmHg - - 94 24 98 % -  04/29/15 1600 130/64 mmHg 99.8 F (37.7 C) - - (!) 28 99 % -  04/29/15 1556 - - - - - 98 % -    7:36 PM Reevaluation with update and discussion. After initial assessment and treatment, an updated evaluation reveals no change in clinical status. Repeat temperature normal. Chest x-ray and urinalysis did not indicate infection. Will give dose of vancomycin, for possible wound infection. The wound is not overtly infected, by visualization or palpation, through the wound dressing. I do not see benefit from removing the wound VAC device at this time. Colyn Miron L      Labs Review Labs Reviewed  WET PREP, GENITAL - Abnormal; Notable for the following:    WBC, Wet Prep HPF POC FEW (*)    All other components within normal limits  COMPREHENSIVE METABOLIC PANEL - Abnormal; Notable for the following:    Glucose, Bld 113 (*)    BUN 39 (*)    Total Protein 6.2 (*)    Albumin 2.9 (*)    AST 45 (*)    GFR calc non Af Amer 59 (*)    All other components within normal limits  CBC WITH DIFFERENTIAL/PLATELET - Abnormal; Notable for the following:    WBC 18.1 (*)    RDW 16.6 (*)    Neutro Abs 14.3 (*)    Eosinophils Absolute 0.9 (*)    All other components within normal limits  URINALYSIS, ROUTINE W REFLEX MICROSCOPIC (NOT AT [facility]) - Abnormal; Notable for the following:    APPearance CLOUDY (*)    Leukocytes, UA TRACE (*)    All other components within normal limits  URINE MICROSCOPIC-ADD ON - Abnormal; Notable for the following:  Squamous Epithelial / LPF 0-5 (*)    Bacteria, UA RARE (*)    All other components within normal limits  I-STAT CG4 LACTIC ACID, ED - Abnormal; Notable for the following:    Lactic Acid, Venous 2.64 (*)    All other components within normal limits  I-STAT CG4 LACTIC ACID, ED - Abnormal; Notable for the following:    Lactic  Acid, Venous 2.93 (*)    All other components within normal limits  CULTURE, BLOOD (ROUTINE X 2)  CULTURE, BLOOD (ROUTINE X 2)  URINE CULTURE  CBC  BASIC METABOLIC PANEL    Imaging Review Dg Chest Port 1 View  04/29/2015  CLINICAL DATA:  81 year old female with fever and wound infection EXAM: PORTABLE CHEST 1 VIEW COMPARISON:  Prior chest x-ray 11/24/2014 FINDINGS: Low inspiratory volumes with mild bibasilar atelectasis. Stable cardiac and mediastinal contours. Atherosclerotic calcifications throughout the thoracic aorta. Stable mild chronic bronchitic change. No pulmonary edema, focal consolidation or pneumothorax. No acute osseous abnormality. IMPRESSION: 1. Low inspiratory volumes with mild bibasilar atelectasis. 2. Otherwise, stable chest x-ray without acute cardiopulmonary process. Electronically Signed   By: Jacqulynn Cadet M.D.   On: 04/29/2015 16:44   I have personally reviewed and evaluated these images and lab results as part of my medical decision-making.   EKG Interpretation None      MDM   Final diagnoses:  Wound infection (HCC)     Fever with elevated white blood cell count, and known coccyx wound, with relatively recent osteomyelitis. She likely has an exacerbation of the soft tissue infection related to the prior infection. Soft tissues of the buttocks, were not clearly visualized because of the presence of the wound VAC.  Nursing Notes Reviewed/ Care Coordinated, and agree without changes. Applicable Imaging Reviewed.  Interpretation of Laboratory Data incorporated into ED treatment  Plan: Admit    Daleen Bo, MD 04/30/15 [phone]

## 2015-04-29 NOTE — ED Notes (Signed)
Pt arrives from [facility] via [place] rt concerns of wound infection on the right sacral region. Staff reports to [facility] pt had an elevated WBC count yesterday and was dx with osteomyelitits dt staph. Pt has a hx of dementia and can only answer simple questions.

## 2015-04-29 NOTE — H&P (Signed)
[facility] History and Physical  [name] Frank WUJ:[id number] DOB: 1934-05-18 DOA: 04/29/2015  Referring physician: EDP PCP: Eulas Post, MD   Chief Complaint: Wound infection   HPI: [name] Frank is a 81 y.o. female with h/o dementia, stage 4 sacral decubitus with osteomyelitis of sacrum with MRSA and 1/2 BCx positive in sept.  Patient presents to the ED with family for evaluation of fever and elevated WBC.  Patient is being treated with wound VAC, completed course of doxycycline about 4 days ago, patient is a little more sleepy than baseline today apparently.  Review of Systems: Systems reviewed.  As above, otherwise negative  Past Medical History  Diagnosis Date  . Allergy   . Hyperlipidemia   . Hypertension   . Anxiety   . GERD (gastroesophageal reflux disease)   . Urinary frequency   . Neurogenic urinary incontinence     since back surgery  . Arthritis     degenerative oateoarthritis  . Decubital ulcer 11/14/2014  . Dementia    Past Surgical History  Procedure Laterality Date  . Appendectomy    . Cholecystectomy    . Abdominal hysterectomy    . Back surgery  2000  . Colonoscopy w/ polypectomy    . Breast surgery  1993    biopsy  . Total hip arthroplasty  01/07/2012    Procedure: TOTAL HIP ARTHROPLASTY;  Surgeon: Yvette Rack., MD;  Location: [place];  Service: Orthopedics;  Laterality: Right;  . Bone marrow biopsy N/A 11/22/2014    Procedure: SACRAL BONE BIOPSY PRONE POSITION;  Surgeon: Georganna Skeans, MD;  Location: [place];  Service: General;  Laterality: N/A;  . Ileo loop colostomy closure N/A 11/22/2014    Procedure: LAPAROSCOPIC LOOP COLOSTOMY ;  Surgeon: Georganna Skeans, MD;  Location: [place];  Service: General;  Laterality: N/A;   Social History:  reports that she quit smoking about 31 years ago. Her smoking use included Cigarettes. She has a 5 pack-year smoking history. She has never used smokeless tobacco. She reports that she does not drink  alcohol or use illicit drugs.  Allergies  Allergen Reactions  . Augmentin [Amoxicillin-Pot Clavulanate]     Per MAR  . Codeine Sulfate Other (See Comments)    stomach pains  . Tramadol Hcl Other (See Comments)    stomach cramps    Family History  Problem Relation Age of Onset  . Diabetes Mother   . Alcohol abuse      fhx  . Arthritis      fhx  . Hypertension      fhx  . Heart disease      fhx  . Cancer      colon, lung, prostate/fhx  . Stroke      fhx  . Depression      fhx     Prior to Admission medications   Medication Sig Start Date End Date Taking? Authorizing Provider  Amino Acids-Protein Hydrolys (FEEDING SUPPLEMENT, PRO-STAT SUGAR FREE 64,) LIQD Take 30 mLs by mouth 3 (three) times daily with meals.   Yes Historical Provider, MD  amLODipine (NORVASC) 5 MG tablet Take 5 mg by mouth daily.   Yes Historical Provider, MD  donepezil (ARICEPT) 10 MG tablet Take 1 tablet (10 mg total) by mouth at bedtime. 07/19/14  Yes Eulas Post, MD  feeding supplement (BOOST / RESOURCE BREEZE) LIQD Take 1 Container by mouth 3 (three) times daily between meals. 11/26/14  Yes Janece Canterbury, MD  furosemide (LASIX)  20 MG tablet TAKE 1 TABLET (20 MG TOTAL) BY MOUTH DAILY. 10/02/14  Yes Eulas Post, MD  losartan (COZAAR) 50 MG tablet TAKE 1 TABLET BY MOUTH DAILY Patient taking differently: TAKE 50 MG BY MOUTH DAILY 01/14/14  Yes Eulas Post, MD  megestrol (MEGACE ORAL) 40 MG/ML suspension One tsp po bid Patient taking differently: Take 200 mg by mouth 2 (two) times daily.  06/19/14  Yes Eulas Post, MD  metoprolol succinate (TOPROL-XL) 25 MG 24 hr tablet Take 1 tablet (25 mg total) by mouth daily. 04/14/15  Yes Eulas Post, MD  Multiple Vitamins-Minerals (CERTAGEN) tablet Take 1 tablet by mouth daily.   Yes Historical Provider, MD  vitamin B-12 (CYANOCOBALAMIN) 500 MCG tablet Take 500 mcg by mouth daily.   Yes Historical Provider, MD  vitamin C (VITAMIN C) 500 MG  tablet Take 1 tablet (500 mg total) by mouth 2 (two) times daily. 11/26/14  Yes Janece Canterbury, MD   Physical Exam: Filed Vitals:   04/29/15 2115 04/29/15 2130  BP: 136/86 142/85  Pulse: 76 78  Temp:    Resp: 22 21    BP 142/85 mmHg  Pulse 78  Temp(Src) 98.7 F (37.1 C) (Rectal)  Resp 21  Wt 57 kg (125 lb 10.6 oz)  SpO2 100%  General Appearance:    Alert, Elderly, frail, demented, no distress, appears stated age  Head:    Normocephalic, atraumatic  Eyes:    PERRL, EOMI, sclera non-icteric        Nose:   Nares without drainage or epistaxis. Mucosa, turbinates normal  Throat:   Moist mucous membranes. Oropharynx without erythema or exudate.  Neck:   Supple. No carotid bruits.  No thyromegaly.  No lymphadenopathy.   Back:     No CVA tenderness, no spinal tenderness  Lungs:     Clear to auscultation bilaterally, without wheezes, rhonchi or rales  Chest wall:    No tenderness to palpitation  Heart:    Regular rate and rhythm without murmurs, gallops, rubs  Abdomen:     Soft, non-tender, nondistended, normal bowel sounds, no organomegaly  Genitalia:    deferred  Rectal:    deferred  Extremities:   No clubbing, cyanosis or edema.  Pulses:   2+ and symmetric all extremities  Skin:   Wound vac device on Coccyx  Lymph nodes:   Cervical, supraclavicular, and axillary nodes normal  Neurologic:   CNII-XII intact. Normal strength, sensation and reflexes      throughout    Labs on Admission:  Basic Metabolic Panel:  Recent Labs Lab 04/29/15 1721  NA 143  K 3.7  CL 108  CO2 23  GLUCOSE 113*  BUN 39*  CREATININE 0.91  CALCIUM 9.1   Liver Function Tests:  Recent Labs Lab 04/29/15 1721  AST 45*  ALT 22  ALKPHOS 59  BILITOT 0.6  PROT 6.2*  ALBUMIN 2.9*   No results for input(s): LIPASE, AMYLASE in the last 168 hours. No results for input(s): AMMONIA in the last 168 hours. CBC:  Recent Labs Lab 04/29/15 1721  WBC 18.1*  NEUTROABS 14.3*  HGB 14.6  HCT 43.9   MCV 86.8  PLT 212   Cardiac Enzymes: No results for input(s): CKTOTAL, CKMB, CKMBINDEX, TROPONINI in the last 168 hours.  BNP (last 3 results)  Recent Labs  09/03/14 0925  PROBNP 37.0   CBG: No results for input(s): GLUCAP in the last 168 hours.  Radiological Exams on Admission: Dg Chest  Port 1 View  04/29/2015  CLINICAL DATA:  81 year old female with fever and wound infection EXAM: PORTABLE CHEST 1 VIEW COMPARISON:  Prior chest x-ray 11/24/2014 FINDINGS: Low inspiratory volumes with mild bibasilar atelectasis. Stable cardiac and mediastinal contours. Atherosclerotic calcifications throughout the thoracic aorta. Stable mild chronic bronchitic change. No pulmonary edema, focal consolidation or pneumothorax. No acute osseous abnormality. IMPRESSION: 1. Low inspiratory volumes with mild bibasilar atelectasis. 2. Otherwise, stable chest x-ray without acute cardiopulmonary process. Electronically Signed   By: Jacqulynn Cadet M.D.   On: 04/29/2015 16:44    EKG: Independently reviewed.  Assessment/Plan Principal Problem:   Wound infection ([place]) Active Problems:   Decubitus ulcer of sacral region, stage 4 (HCC)   Osteomyelitis, pelvic region and thigh ([place])   Osteomyelitis due to staphylococcus aureus (HCC)   1. Stage 4 sacral decubitus - suspicious for infection, osteomyelitis, known MRSA osteomyelitis in Sept last year 1. Empiric vancomycin 2. MRI 3. If positive consult ID 4. Wound care consult ordered 5. BCx pending 2. Dementia with adult failure to thrive - 1. Likely needs repeat palliative care consult, dosent sound like family was ready for this last time.    Code Status: Full  Family Communication: Family at bedside Disposition Plan: Admit to inpatient   Time spent: 32 min  ,  M. [facility] Pager [phone]  If 7AM-7PM, please contact the day team taking care of the patient [email or web address] Password [facility] 04/29/2015, 10:26 PM

## 2015-04-30 ENCOUNTER — Encounter (HOSPITAL_COMMUNITY): Payer: Self-pay | Admitting: General Surgery

## 2015-04-30 ENCOUNTER — Inpatient Hospital Stay (HOSPITAL_COMMUNITY): Payer: Medicare Other

## 2015-04-30 DIAGNOSIS — R5381 Other malaise: Secondary | ICD-10-CM

## 2015-04-30 DIAGNOSIS — L89154 Pressure ulcer of sacral region, stage 4: Principal | ICD-10-CM

## 2015-04-30 LAB — CBC
HCT: 38.2 % (ref 36.0–46.0)
HEMOGLOBIN: 12.4 g/dL (ref 12.0–15.0)
MCH: 27.8 pg (ref 26.0–34.0)
MCHC: 32.5 g/dL (ref 30.0–36.0)
MCV: 85.7 fL (ref 78.0–100.0)
PLATELETS: 189 10*3/uL (ref 150–400)
RBC: 4.46 MIL/uL (ref 3.87–5.11)
RDW: 16.2 % — ABNORMAL HIGH (ref 11.5–15.5)
WBC: 15.2 10*3/uL — AB (ref 4.0–10.5)

## 2015-04-30 LAB — BASIC METABOLIC PANEL
ANION GAP: 9 (ref 5–15)
BUN: 19 mg/dL (ref 6–20)
CHLORIDE: 111 mmol/L (ref 101–111)
CO2: 21 mmol/L — ABNORMAL LOW (ref 22–32)
Calcium: 8.2 mg/dL — ABNORMAL LOW (ref 8.9–10.3)
Creatinine, Ser: 0.59 mg/dL (ref 0.44–1.00)
Glucose, Bld: 88 mg/dL (ref 65–99)
POTASSIUM: 4.1 mmol/L (ref 3.5–5.1)
SODIUM: 141 mmol/L (ref 135–145)

## 2015-04-30 LAB — URINE CULTURE

## 2015-04-30 MED ORDER — GADOBENATE DIMEGLUMINE 529 MG/ML IV SOLN
10.0000 mL | Freq: Once | INTRAVENOUS | Status: AC
  Administered 2015-04-30: 10 mL via INTRAVENOUS

## 2015-04-30 MED ORDER — BOOST / RESOURCE BREEZE PO LIQD
1.0000 | Freq: Two times a day (BID) | ORAL | Status: DC
  Administered 2015-05-01 – 2015-05-02 (×5): 1 via ORAL
  Filled 2015-04-30 (×6): qty 1

## 2015-04-30 MED ORDER — ENSURE ENLIVE PO LIQD
237.0000 mL | ORAL | Status: DC
  Administered 2015-04-30 – 2015-05-01 (×2): 237 mL via ORAL
  Filled 2015-04-30 (×3): qty 237

## 2015-04-30 MED ORDER — COLLAGENASE 250 UNIT/GM EX OINT
TOPICAL_OINTMENT | Freq: Every day | CUTANEOUS | Status: DC
  Administered 2015-05-01 – 2015-05-02 (×2): via TOPICAL
  Filled 2015-04-30: qty 30

## 2015-04-30 MED ORDER — ADULT MULTIVITAMIN W/MINERALS CH
1.0000 | ORAL_TABLET | Freq: Every day | ORAL | Status: DC
  Administered 2015-05-01 – 2015-05-02 (×2): 1 via ORAL
  Filled 2015-04-30 (×2): qty 1

## 2015-04-30 NOTE — Care Management Note (Signed)
Case Management Note  Patient Details  Name: [name] Frank MRN: [id number] Date of Birth: 1935-01-19  Subjective/Objective:  Patient admitted with sacral wound infection. Pt from [facility].                   Action/Plan: CM following for discharge disposition.   Expected Discharge Date:                  Expected Discharge Plan:  Skilled Nursing Facility  In-House Referral:     Discharge planning Services     Post Acute Care Choice:    Choice offered to:     DME Arranged:    DME Agency:     HH Arranged:    HH Agency:     Status of Service:  In process, will continue to follow  Medicare Important Message Given:    Date Medicare IM Given:    Medicare IM give by:    Date Additional Medicare IM Given:    Additional Medicare Important Message give by:     If discussed at Long Length of Stay Meetings, dates discussed:    Additional Comments:  Kermit Balo, RN 04/30/2015, 1:34 PM

## 2015-04-30 NOTE — Progress Notes (Signed)
[facility] PROGRESS NOTE  [name] Frank NWG:[id number] DOB: 06/27/1934 DOA: 04/29/2015 PCP: Kristian Covey, MD  HPI/Brief narrative See admit h and p from 2/14 for details. Briefly, 81 y.o. female with h/o dementia, stage 4 sacral decubitus with osteomyelitis of sacrum with MRSA and 1/2 BCx positive in sept. Patient presents to the ED with family for evaluation of fever and elevated WBC. Patient is being treated with wound VAC, completed course of doxycycline about 4 days prior to admission  Assessment/Plan: 1. Stage 4 sacral decubitus - Initial suspicion for infection vs osteomyelitis especially with known MRSA osteomyelitis in Sept last year 1. Pt was started on empiric vancomycin 2. MRI neg for osteomyelitis 3. Wound care consult ordered with recs for wet to dry dressings qday 4. BCx pending, neg thus far 5. Surgery consulted. Recs for hydrotherapy 2. Dementia with adult failure to thrive - 1. Likely needs repeat palliative care consult, dosent sound like family was ready for this last time. 3. DVT prophylaxis 1. Lovenox subQ 4. Ostomy site 1. Surgery consulted and bridge now removed  Code Status: Full Family Communication: Pt in room, family at bedside Disposition Plan: Possible d/c in 1-2 days   Consultants:  General Surgery  Procedures:    Antibiotics: Anti-infectives    Start     Dose/Rate Route Frequency Ordered Stop   04/30/15 0800  vancomycin (VANCOCIN) IVPB 750 mg/150 ml premix     750 mg 150 mL/hr over 60 Minutes Intravenous Every 12 hours 04/29/15 2011     04/29/15 1945  vancomycin (VANCOCIN) IVPB 1000 mg/200 mL premix     1,000 mg 200 mL/hr over 60 Minutes Intravenous  Once 04/29/15 1944 04/29/15 2139      HPI/Subjective: No complaints this AM  Objective: Filed Vitals:   04/29/15 2345 04/30/15 0101 04/30/15 0501 04/30/15 1400  BP: 149/83 154/71 130/78 127/72  Pulse: 83 81 76 82  Temp:  98.1 F (36.7 C) 98.6 F (37 C) 98.5 F  (36.9 C)  TempSrc:  Oral Oral Oral  Resp: Weight:      SpO2: 100% 100% 100% 99%    Intake/Output Summary (Last 24 hours) at 04/30/15 1752 Last data filed at 04/30/15 0101  Gross per 24 hour  Intake      0 ml  Output      0 ml  Net      0 ml   Filed Weights   04/29/15 1940  Weight: 57 kg (125 lb 10.6 oz)    Exam:   General:  Awake, in nad  Cardiovascular: regular, s1, s2  Respiratory: normal resp effort, no wheezing  Abdomen: soft, nondistended  Musculoskeletal: perfused, no clubbing   Data Reviewed: Basic Metabolic Panel:  Recent Labs Lab 04/29/15 1721 04/30/15 0654  NA 143 141  K 3.7 4.1  CL 108 111  CO2 23 21*  GLUCOSE 113* 88  BUN 39* 19  CREATININE 0.91 0.59  CALCIUM 9.1 8.2*   Liver Function Tests:  Recent Labs Lab 04/29/15 1721  AST 45*  ALT 22  ALKPHOS 59  BILITOT 0.6  PROT 6.2*  ALBUMIN 2.9*   No results for input(s): LIPASE, AMYLASE in the last 168 hours. No results for input(s): AMMONIA in the last 168 hours. CBC:  Recent Labs Lab 04/29/15 1721 04/30/15 0654  WBC 18.1* 15.2*  NEUTROABS 14.3*  --   HGB 14.6 12.4  HCT 43.9 38.2  MCV 86.8 85.7  PLT 212 189   Cardiac  Enzymes: No results for input(s): CKTOTAL, CKMB, CKMBINDEX, TROPONINI in the last 168 hours. BNP (last 3 results) No results for input(s): BNP in the last 8760 hours.  ProBNP (last 3 results)  Recent Labs  09/03/14 0925  PROBNP 37.0    CBG: No results for input(s): GLUCAP in the last 168 hours.  Recent Results (from the past 240 hour(s))  Blood Culture (routine x 2)     Status: None (Preliminary result)   Collection Time: 04/29/15  4:55 PM  Result Value Ref Range Status   Specimen Description BLOOD LEFT ANTECUBITAL  Final   Special Requests   Final    BOTTLES DRAWN AEROBIC AND ANAEROBIC 10CC AER, 5CC ANA   Culture NO GROWTH < 24 HOURS  Final   Report Status PENDING  Incomplete  Blood Culture (routine x 2)     Status: None (Preliminary  result)   Collection Time: 04/29/15  5:08 PM  Result Value Ref Range Status   Specimen Description BLOOD LEFT HAND  Final   Special Requests BOTTLES DRAWN AEROBIC ONLY 10CC  Final   Culture NO GROWTH < 24 HOURS  Final   Report Status PENDING  Incomplete  Urine culture     Status: None   Collection Time: 04/29/15  5:34 PM  Result Value Ref Range Status   Specimen Description URINE, RANDOM  Final   Special Requests NONE  Final   Culture MULTIPLE SPECIES PRESENT, SUGGEST RECOLLECTION  Final   Report Status 04/30/2015 FINAL  Final  Wet prep, genital     Status: Abnormal   Collection Time: 04/29/15  5:34 PM  Result Value Ref Range Status   Yeast Wet Prep HPF POC NONE SEEN NONE SEEN Final   Trich, Wet Prep NONE SEEN NONE SEEN Final   Clue Cells Wet Prep HPF POC NONE SEEN NONE SEEN Final   WBC, Wet Prep HPF POC FEW (A) NONE SEEN Final   Sperm NONE SEEN  Final     Studies: Mr Pelvis W Wo Contrast  04/30/2015  CLINICAL DATA:  Stage IV sacral decubitus ulcer with fever and elevated white blood count. Osteomyelitis of the sacrum. EXAM: MRI PELVIS WITHOUT AND WITH CONTRAST TECHNIQUE: Multiplanar multisequence MR imaging of the pelvis was performed both before and after administration of intravenous contrast. CONTRAST:  10mL MULTIHANCE GADOBENATE DIMEGLUMINE 529 MG/ML IV SOLN COMPARISON:  MRI dated 11/15/2014 and CT scan dated 11/15/2014 FINDINGS: There is a midline sacral decubitus ulcer. The ulcer extends just superficial to the distal sacrum. There is either been resection or destruction of the fourth and fifth sacral segments and a partial resection of the proximal coccyx since the prior exam. There is new enhancement of the gluteus maximus muscles adjacent to tear sacral attachments and just medial and lateral to the sacral decubitus ulcer, consistent with myositis. There is no definable abscess or current osteomyelitis. Ostomy is noted in the left lower quadrant. There is no adenopathy or mass  lesion. There is atrophy of the muscles of the hips in both proximal thighs. Right hip prosthesis noted. IMPRESSION: 1. No evidence of current osteomyelitis or soft tissue abscess. 2. Deep sacral decubitus ulcer with adjacent focal myositis of the gluteus maximus muscles. I Electronically Signed   By: Francene Boyers M.D.   On: 04/30/2015 10:16   Dg Chest Port 1 View  04/29/2015  CLINICAL DATA:  81 year old female with fever and wound infection EXAM: PORTABLE CHEST 1 VIEW COMPARISON:  Prior chest x-ray 11/24/2014 FINDINGS: Low inspiratory volumes  with mild bibasilar atelectasis. Stable cardiac and mediastinal contours. Atherosclerotic calcifications throughout the thoracic aorta. Stable mild chronic bronchitic change. No pulmonary edema, focal consolidation or pneumothorax. No acute osseous abnormality. IMPRESSION: 1. Low inspiratory volumes with mild bibasilar atelectasis. 2. Otherwise, stable chest x-ray without acute cardiopulmonary process. Electronically Signed   By: Malachy Moan M.D.   On: 04/29/2015 16:44    Scheduled Meds: . amLODipine  5 mg Oral Daily  . collagenase   Topical Daily  . donepezil  10 mg Oral QHS  . enoxaparin (LOVENOX) injection  40 mg Subcutaneous Daily  . [START ON 05/01/2015] feeding supplement  1 Container Oral BID BM  . feeding supplement (ENSURE ENLIVE)  237 mL Oral Q24H  . feeding supplement (PRO-STAT SUGAR FREE 64)  30 mL Oral TID WC  . losartan  50 mg Oral Daily  . megestrol  200 mg Oral BID  . metoprolol succinate  25 mg Oral Daily  . multivitamin with minerals  1 tablet Oral Daily  . vancomycin  750 mg Intravenous Q12H   Continuous Infusions: . sodium chloride 100 mL/hr (04/30/15 0610)    Principal Problem:   Wound infection (HCC) Active Problems:   Decubitus ulcer of sacral region, stage 4 (HCC)   Osteomyelitis, pelvic region and thigh (HCC)   Osteomyelitis due to staphylococcus aureus (HCC)    Raney Koeppen K  [facility] Pager  [phone]. If 7PM-7AM, please contact night-coverage at [email or web address], password [facility] 04/30/2015, 5:52 PM  LOS: 1 day

## 2015-04-30 NOTE — Progress Notes (Signed)
Pt arrived on unit 0045 hrs, alert, oriented to self, place and time, no obvious distress, no c/o pain, wound vac dressing with tubing in place, loose and not sealed with strong odor, pt oriented to room and equipment as pt was able to comprehend, admission orders implemented/continued, received verbal/phone order to removed wound vac dressing and place gauze dressing pending wound care consult. Also pt placed on contact precautions for potential MRSA.

## 2015-04-30 NOTE — Consult Note (Signed)
Reason for Consult: sacral decubitus ulcer and ostomy bridge Referring Physician: Dr. Marylu Lund    HPI: [name] Frank is a 81 year old female with a history of demential, stage IV sacral decubitus ulcer which was debrided by Dr. Grandville Silos in September of 2016 along with having a diverting colostomy.  She was admitted with apparent wound infection.  The patient states coming in because of hip pain.  Unable to answer more specific questions.  Does not recall wound being changed routinely at facility. WBC 18k down to 15.2k today. Afebrile. VSS.  MR of pelvis shows not osteomyelitis or abscess.   She was also noted to have a bridge to her ostomy.   Past Medical History  Diagnosis Date  . Allergy   . Hyperlipidemia   . Hypertension   . Anxiety   . GERD (gastroesophageal reflux disease)   . Urinary frequency   . Neurogenic urinary incontinence     since back surgery  . Arthritis     degenerative oateoarthritis  . Decubital ulcer 11/14/2014  . Dementia     Past Surgical History  Procedure Laterality Date  . Appendectomy    . Cholecystectomy    . Abdominal hysterectomy    . Back surgery  2000  . Colonoscopy w/ polypectomy    . Breast surgery  1993    biopsy  . Total hip arthroplasty  01/07/2012    Procedure: TOTAL HIP ARTHROPLASTY;  Surgeon: Yvette Rack., MD;  Location: [place];  Service: Orthopedics;  Laterality: Right;  . Bone marrow biopsy N/A 11/22/2014    Procedure: SACRAL BONE BIOPSY PRONE POSITION;  Surgeon: Georganna Skeans, MD;  Location: [place];  Service: General;  Laterality: N/A;  . Ileo loop colostomy closure N/A 11/22/2014    Procedure: LAPAROSCOPIC LOOP COLOSTOMY ;  Surgeon: Georganna Skeans, MD;  Location: [facility] OR;  Service: General;  Laterality: N/A;    Family History  Problem Relation Age of Onset  . Diabetes Mother   . Alcohol abuse      fhx  . Arthritis      fhx  . Hypertension      fhx  . Heart disease      fhx  . Cancer      colon, lung, prostate/fhx  .  Stroke      fhx  . Depression      fhx    Social History:  reports that she quit smoking about 31 years ago. Her smoking use included Cigarettes. She has a 5 pack-year smoking history. She has never used smokeless tobacco. She reports that she does not drink alcohol or use illicit drugs.  Allergies:  Allergies  Allergen Reactions  . Augmentin [Amoxicillin-Pot Clavulanate]     Per MAR  . Codeine Sulfate Other (See Comments)    stomach pains  . Tramadol Hcl Other (See Comments)    stomach cramps    Medications:  Scheduled Meds: . amLODipine  5 mg Oral Daily  . collagenase   Topical Daily  . donepezil  10 mg Oral QHS  . enoxaparin (LOVENOX) injection  40 mg Subcutaneous Daily  . feeding supplement  1 Container Oral TID BM  . feeding supplement (PRO-STAT SUGAR FREE 64)  30 mL Oral TID WC  . losartan  50 mg Oral Daily  . megestrol  200 mg Oral BID  . metoprolol succinate  25 mg Oral Daily  . vancomycin  750 mg Intravenous Q12H   Continuous Infusions: . sodium chloride 100  mL/hr (04/30/15 0610)   PRN Meds:.   Results for orders placed or performed during the hospital encounter of 04/29/15 (from the past 48 hour(s))  Blood Culture (routine x 2)     Status: None (Preliminary result)   Collection Time: 04/29/15  4:55 PM  Result Value Ref Range   Specimen Description BLOOD LEFT ANTECUBITAL    Special Requests      BOTTLES DRAWN AEROBIC AND ANAEROBIC 10CC AER, 5CC ANA   Culture NO GROWTH < 24 HOURS    Report Status PENDING   Blood Culture (routine x 2)     Status: None (Preliminary result)   Collection Time: 04/29/15  5:08 PM  Result Value Ref Range   Specimen Description BLOOD LEFT HAND    Special Requests BOTTLES DRAWN AEROBIC ONLY 10CC    Culture NO GROWTH < 24 HOURS    Report Status PENDING   I-Stat CG4 Lactic Acid, ED  (not at  [facility])     Status: Abnormal   Collection Time: 04/29/15  5:15 PM  Result Value Ref Range   Lactic Acid, Venous 2.64 (HH) 0.5 - 2.0 mmol/L    Comment NOTIFIED PHYSICIAN   Comprehensive metabolic panel     Status: Abnormal   Collection Time: 04/29/15  5:21 PM  Result Value Ref Range   Sodium 143 135 - 145 mmol/L   Potassium 3.7 3.5 - 5.1 mmol/L   Chloride 108 101 - 111 mmol/L   CO2 23 22 - 32 mmol/L   Glucose, Bld 113 (H) 65 - 99 mg/dL   BUN 39 (H) 6 - 20 mg/dL   Creatinine, Ser 0.91 0.44 - 1.00 mg/dL   Calcium 9.1 8.9 - 10.3 mg/dL   Total Protein 6.2 (L) 6.5 - 8.1 g/dL   Albumin 2.9 (L) 3.5 - 5.0 g/dL   AST 45 (H) 15 - 41 U/L   ALT 22 14 - 54 U/L   Alkaline Phosphatase 59 38 - 126 U/L   Total Bilirubin 0.6 0.3 - 1.2 mg/dL   GFR calc non Af Amer 59 (L) >60 mL/min   GFR calc Af Amer >60 >60 mL/min    Comment: (NOTE) The eGFR has been calculated using the CKD EPI equation. This calculation has not been validated in all clinical situations. eGFR's persistently <60 mL/min signify possible Chronic Kidney Disease.    Anion gap 12 5 - 15  CBC WITH DIFFERENTIAL     Status: Abnormal   Collection Time: 04/29/15  5:21 PM  Result Value Ref Range   WBC 18.1 (H) 4.0 - 10.5 K/uL   RBC 5.06 3.87 - 5.11 MIL/uL   Hemoglobin 14.6 12.0 - 15.0 g/dL   HCT 43.9 36.0 - 46.0 %   MCV 86.8 78.0 - 100.0 fL   MCH 28.9 26.0 - 34.0 pg   MCHC 33.3 30.0 - 36.0 g/dL   RDW 16.6 (H) 11.5 - 15.5 %   Platelets 212 150 - 400 K/uL   Neutrophils Relative % 79 %   Lymphocytes Relative 11 %   Monocytes Relative 5 %   Eosinophils Relative 5 %   Basophils Relative 0 %   Neutro Abs 14.3 (H) 1.7 - 7.7 K/uL   Lymphs Abs 2.0 0.7 - 4.0 K/uL   Monocytes Absolute 0.9 0.1 - 1.0 K/uL   Eosinophils Absolute 0.9 (H) 0.0 - 0.7 K/uL   Basophils Absolute 0.0 0.0 - 0.1 K/uL   RBC Morphology POLYCHROMASIA PRESENT     Comment: TARGET CELLS  Urinalysis,  Routine w reflex microscopic (not at [facility])     Status: Abnormal   Collection Time: 04/29/15  5:34 PM  Result Value Ref Range   Color, Urine YELLOW YELLOW   APPearance CLOUDY (A) CLEAR   Specific Gravity, Urine  1.019 1.005 - 1.030   pH 5.0 5.0 - 8.0   Glucose, UA NEGATIVE NEGATIVE mg/dL   Hgb urine dipstick NEGATIVE NEGATIVE   Bilirubin Urine NEGATIVE NEGATIVE   Ketones, ur NEGATIVE NEGATIVE mg/dL   Protein, ur NEGATIVE NEGATIVE mg/dL   Nitrite NEGATIVE NEGATIVE   Leukocytes, UA TRACE (A) NEGATIVE  Urine culture     Status: None   Collection Time: 04/29/15  5:34 PM  Result Value Ref Range   Specimen Description URINE, RANDOM    Special Requests NONE    Culture MULTIPLE SPECIES PRESENT, SUGGEST RECOLLECTION    Report Status 04/30/2015 FINAL   Wet prep, genital     Status: Abnormal   Collection Time: 04/29/15  5:34 PM  Result Value Ref Range   Yeast Wet Prep HPF POC NONE SEEN NONE SEEN   Trich, Wet Prep NONE SEEN NONE SEEN   Clue Cells Wet Prep HPF POC NONE SEEN NONE SEEN   WBC, Wet Prep HPF POC FEW (A) NONE SEEN   Sperm NONE SEEN   Urine microscopic-add on     Status: Abnormal   Collection Time: 04/29/15  5:34 PM  Result Value Ref Range   Squamous Epithelial / LPF 0-5 (A) NONE SEEN   WBC, UA 0-5 0 - 5 WBC/hpf   RBC / HPF NONE SEEN 0 - 5 RBC/hpf   Bacteria, UA RARE (A) NONE SEEN  I-Stat CG4 Lactic Acid, ED  (not at  [facility])     Status: Abnormal   Collection Time: 04/29/15  9:25 PM  Result Value Ref Range   Lactic Acid, Venous 2.93 (HH) 0.5 - 2.0 mmol/L   Comment NOTIFIED PHYSICIAN   CBC     Status: Abnormal   Collection Time: 04/30/15  6:54 AM  Result Value Ref Range   WBC 15.2 (H) 4.0 - 10.5 K/uL   RBC 4.46 3.87 - 5.11 MIL/uL   Hemoglobin 12.4 12.0 - 15.0 g/dL   HCT 38.2 36.0 - 46.0 %   MCV 85.7 78.0 - 100.0 fL   MCH 27.8 26.0 - 34.0 pg   MCHC 32.5 30.0 - 36.0 g/dL   RDW 16.2 (H) 11.5 - 15.5 %   Platelets 189 150 - 400 K/uL  Basic metabolic panel     Status: Abnormal   Collection Time: 04/30/15  6:54 AM  Result Value Ref Range   Sodium 141 135 - 145 mmol/L   Potassium 4.1 3.5 - 5.1 mmol/L    Comment: SLIGHT HEMOLYSIS   Chloride 111 101 - 111 mmol/L   CO2 21 (L) 22 - 32  mmol/L   Glucose, Bld 88 65 - 99 mg/dL   BUN 19 6 - 20 mg/dL   Creatinine, Ser 0.59 0.44 - 1.00 mg/dL   Calcium 8.2 (L) 8.9 - 10.3 mg/dL   GFR calc non Af Amer >60 >60 mL/min   GFR calc Af Amer >60 >60 mL/min    Comment: (NOTE) The eGFR has been calculated using the CKD EPI equation. This calculation has not been validated in all clinical situations. eGFR's persistently <60 mL/min signify possible Chronic Kidney Disease.    Anion gap 9 5 - 15    Mr Pelvis W Wo Contrast  04/30/2015  CLINICAL DATA:  Stage IV sacral decubitus ulcer with fever and elevated white blood count. Osteomyelitis of the sacrum. EXAM: MRI PELVIS WITHOUT AND WITH CONTRAST TECHNIQUE: Multiplanar multisequence MR imaging of the pelvis was performed both before and after administration of intravenous contrast. CONTRAST:  32m MULTIHANCE GADOBENATE DIMEGLUMINE 529 MG/ML IV SOLN COMPARISON:  MRI dated 11/15/2014 and CT scan dated 11/15/2014 FINDINGS: There is a midline sacral decubitus ulcer. The ulcer extends just superficial to the distal sacrum. There is either been resection or destruction of the fourth and fifth sacral segments and a partial resection of the proximal coccyx since the prior exam. There is new enhancement of the gluteus maximus muscles adjacent to tear sacral attachments and just medial and lateral to the sacral decubitus ulcer, consistent with myositis. There is no definable abscess or current osteomyelitis. Ostomy is noted in the left lower quadrant. There is no adenopathy or mass lesion. There is atrophy of the muscles of the hips in both proximal thighs. Right hip prosthesis noted. IMPRESSION: 1. No evidence of current osteomyelitis or soft tissue abscess. 2. Deep sacral decubitus ulcer with adjacent focal myositis of the gluteus maximus muscles. I Electronically Signed   By: JLorriane ShireM.D.   On: 04/30/2015 10:16   Dg Chest Port 1 View  04/29/2015  CLINICAL DATA:  81year old female with fever and wound  infection EXAM: PORTABLE CHEST 1 VIEW COMPARISON:  Prior chest x-ray 11/24/2014 FINDINGS: Low inspiratory volumes with mild bibasilar atelectasis. Stable cardiac and mediastinal contours. Atherosclerotic calcifications throughout the thoracic aorta. Stable mild chronic bronchitic change. No pulmonary edema, focal consolidation or pneumothorax. No acute osseous abnormality. IMPRESSION: 1. Low inspiratory volumes with mild bibasilar atelectasis. 2. Otherwise, stable chest x-ray without acute cardiopulmonary process. Electronically Signed   By: HJacqulynn CadetM.D.   On: 04/29/2015 16:44    Review of Systems  Unable to perform ROS  Blood pressure 127/72, pulse 82, temperature 98.5 F (36.9 C), temperature source Oral, resp. rate 16, weight 57 kg (125 lb 10.6 oz), SpO2 99 %. Physical Exam  Constitutional: Vital signs are normal. She appears cachectic. No distress.  Cardiovascular: Normal rate, regular rhythm, normal heart sounds and intact distal pulses.  Exam reveals no gallop and no friction rub.   No murmur heard. GI:  llq ostomy is pink.  There is a bridge with superior side that is inbedded in beneath the skin.  Dr. CBrantley Stagewas able to rotate the bridge top and pull out the bridge.  There was minimal bleeding.  Ostomy appliance was reapplied.   Neurological: She is alert.  Skin: She is not diaphoretic.  Sacral decub--4X3X.8cm with undermining to wound edges .5cm, bone visible.  About 80% clean, slough noted.     Assessment/Plan: Stage IV sacral decubitus ulcer-she does not need antibiotics or surgical debridement from our standpoint.  Agree with Santyl and BID wet to dry dressing changes.  Add PT hydrotherapy.  This is a chronic wound, will likely not heal if the patient remains immobile. Recommend aggressive PT, frequent turning, and adequate oral intake. S/p diverting loop colostomy-bridge was removed at bedside.  Routine ostomy care.   Aries Kasa ANP-BC 04/30/2015, 3:30 PM

## 2015-04-30 NOTE — Consult Note (Addendum)
WOC wound consult note Reason for Consult: Consult requested for sacrum wound.  According to EMR, this is chronic and patient was using negative pressure to the site prior to admission at a SNF.  The machine was removed at some point and moist gauze dressing was applied; the machine is not in the patient's room.   Wound type: Stage 4 pressure injury to sacrum Pressure Ulcer POA: Yes Measurement: 4X3X.8cm with undermining to wound edges .5cm, bone visible. Wound bed: 60% red, 40% loose slough/eschar.   Drainage (amount, consistency, odor) Large amt yellow drainage.  Wound was reported to have very strong odor upon admission, which has improved at this time. Periwound: White macerated wound edges related to prolonged moisture. Dressing procedure/placement/frequency: Wound Vac is not appropriate therapy at this time.  According to [facility] literature, a wound should have less than 20% of nonviable tissue.  It is difficult to keep wound from becoming soiled related to frequent urinary incontinence.  Moist gauze packing applied.  Pt could benefit from surgical consult for possible bedside debridement.  Discussed plan of care with primary physician and family member assessed wound and was very disappointed at the appearance; they stated it has significantly declined since they last visualized the location. Air mattress to reduce pressure.    WOC ostomy consult note Stoma type/location: Stoma red and viable to LLQ; according to EMR surgery was in October 2016 Stomal assessment/size: Approx 1 inch, above skin level, Peristomal assessment: Intact skin surrounding Output: Large amt formed brown stool in the pouch  Ostomy pouching: 2pc.  Pt has total assistance at the SNF for pouch application and emptying.  Extra supplies left at the bedside for staff nurse use.  Pt has a plastic rod from when the surgery was performed through the stoma. These are usually removed after 7-10 days.  The rod has grown below the level  of the surrounding skin and will be difficult to remove.  Discussed with primary team and he plans to consult surgical team for plan of care to remove. Please re-consult if further assistance is needed.  Thank-you,  Cammie Mcgee MSN, RN, CWOCN, [place], CNS [phone]

## 2015-04-30 NOTE — Progress Notes (Signed)
Initial Nutrition Assessment  DOCUMENTATION CODES:   Non-severe (moderate) malnutrition in context of chronic illness  INTERVENTION:  Provide Boost Breeze po BID, each supplement provides 250 kcal and 9 grams of protein Provide Ensure Enlive once daily, provides 350 kcal and 20 grams of protein Continue 30 ml of Pro-stat TID, each dose provides 100 kcal and 15 grams of protein  NUTRITION DIAGNOSIS:   Increased nutrient needs related to wound healing as evidenced by estimated needs.   GOAL:   Patient will meet greater than or equal to 90% of their needs   MONITOR:   PO intake, Supplement acceptance, Labs, Weight trends, I & O's, Skin  REASON FOR ASSESSMENT:   Malnutrition Screening Tool    ASSESSMENT:   81 y.o. female with h/o dementia, stage 4 sacral decubitus with osteomyelitis of sacrum with MRSA and 1/2 BCx positive in sept. Patient presents to the ED with family for evaluation of fever and elevated WBC. Patient is being treated with wound VAC, completed course of doxycycline about 4 days ago, patient is a little more sleepy than baseline today apparently.  Pt states that her appetite has been good and she has been eating 3 meals daily with protein at each meal. She states that she likes Pro-stat and has been taking it 3 times daily. She states that she likes Pro-stat and Boost Breeze supplements. Per weight, history, pt has regained weight in the past several months, going from 111 lbs to 125 lbs. She appears fairly well-nourished, but continues to have some moderate muscle and fat wasting per nutrition focused physical exam.   Labs: low calcium  Diet Order:  Diet Heart Room service appropriate?: Yes; Fluid consistency:: Thin  Skin:  Wound (see comment) (stage IV sacral wound)  Last BM:  2/15  Height:   Ht Readings from Last 1 Encounters:  04/14/15  (1.702 m)    Weight:   Wt Readings from Last 1 Encounters:  04/29/15 125 lb 10.6 oz (57 kg)    Ideal  Body Weight:  61.4 kg  BMI:  Body mass index is 19.68 kg/(m^2).  Estimated Nutritional Needs:   Kcal:  1700-1900  Protein:  80-90 grams  Fluid:  1.7 L/day  EDUCATION NEEDS:   No education needs identified at this time  Dorothea Ogle RD, LDN Inpatient Clinical Dietitian Pager: [phone] After Hours Pager: [phone]

## 2015-05-01 DIAGNOSIS — L089 Local infection of the skin and subcutaneous tissue, unspecified: Secondary | ICD-10-CM

## 2015-05-01 DIAGNOSIS — E440 Moderate protein-calorie malnutrition: Secondary | ICD-10-CM | POA: Insufficient documentation

## 2015-05-01 DIAGNOSIS — T148 Other injury of unspecified body region: Secondary | ICD-10-CM

## 2015-05-01 LAB — CBC
HEMATOCRIT: 39 % (ref 36.0–46.0)
Hemoglobin: 12.9 g/dL (ref 12.0–15.0)
MCH: 28.5 pg (ref 26.0–34.0)
MCHC: 33.1 g/dL (ref 30.0–36.0)
MCV: 86.1 fL (ref 78.0–100.0)
Platelets: 205 10*3/uL (ref 150–400)
RBC: 4.53 MIL/uL (ref 3.87–5.11)
RDW: 16.3 % — AB (ref 11.5–15.5)
WBC: 13.6 10*3/uL — AB (ref 4.0–10.5)

## 2015-05-01 LAB — BASIC METABOLIC PANEL
Anion gap: 10 (ref 5–15)
BUN: 14 mg/dL (ref 6–20)
CHLORIDE: 111 mmol/L (ref 101–111)
CO2: 22 mmol/L (ref 22–32)
Calcium: 8.8 mg/dL — ABNORMAL LOW (ref 8.9–10.3)
Creatinine, Ser: 0.54 mg/dL (ref 0.44–1.00)
GFR calc Af Amer: >60 mL/min (ref >60)
GFR calc non Af Amer: >60 mL/min (ref >60)
Glucose, Bld: 88 mg/dL (ref 65–99)
POTASSIUM: 4.1 mmol/L (ref 3.5–5.1)
SODIUM: 143 mmol/L (ref 135–145)

## 2015-05-01 LAB — GLUCOSE, CAPILLARY: GLUCOSE-CAPILLARY: 88 mg/dL (ref 65–99)

## 2015-05-01 LAB — MRSA PCR SCREENING: MRSA by PCR: NEGATIVE

## 2015-05-01 NOTE — Progress Notes (Signed)
[facility] PROGRESS NOTE  [name] Frank WUJ:[id number] DOB: 12/03/1934 DOA: 04/29/2015 PCP: Kristian Covey, MD  HPI/Brief narrative See admit h and p from 2/14 for details. Briefly, 81 y.o. female with h/o dementia, stage 4 sacral decubitus with osteomyelitis of sacrum with MRSA and 1/2 BCx positive in sept. Patient presents to the ED with family for evaluation of fever and elevated WBC. Patient is being treated with wound VAC, completed course of doxycycline about 4 days prior to admission  Assessment/Plan: 1. Stage 4 sacral decubitus - Initial suspicion for infection vs osteomyelitis especially with known MRSA osteomyelitis in Sept last year 1. Pt was started on empiric vancomycin 2. MRI neg for osteomyelitis 3. Wound care consult ordered with recs for wet to dry dressings qday 4. BCx pending, neg thus far 5. Surgery consulted. Recs for hydrotherapy and recs to d/c abx 6. Vancomycin d/c'd today 7. Per family, patient has been refusing PT at SNF. Reminded pt and family that if pt does not participate in physical therapy, then chance of meaningful recovery/strengthening would be bleak. Patient seems to understand 2. Dementia with adult failure to thrive - 1. Likely needs repeat palliative care consult, dosent sound like family was ready for this last time. 3. DVT prophylaxis 1. Lovenox subQ 4. Ostomy site 1. Surgery consulted and bridge now removed  Code Status: Full Family Communication: Pt in room, family at bedside Disposition Plan: Possible d/c in 24hrs   Consultants:  General Surgery  Procedures:    Antibiotics: Anti-infectives    Start     Dose/Rate Route Frequency Ordered Stop   04/30/15 0800  vancomycin (VANCOCIN) IVPB 750 mg/150 ml premix  Status:  Discontinued     750 mg 150 mL/hr over 60 Minutes Intravenous Every 12 hours 04/29/15 2011 05/01/15 1236   04/29/15 1945  vancomycin (VANCOCIN) IVPB 1000 mg/200 mL premix     1,000 mg 200 mL/hr over 60  Minutes Intravenous  Once 04/29/15 1944 04/29/15 2139      HPI/Subjective: No complaints this AM  Objective: Filed Vitals:   05/01/15 0533 05/01/15 0945 05/01/15 1348 05/01/15 1806  BP: 123/73 120/67 116/51 108/55  Pulse: 74 73 79 102  Temp: 97.8 F (36.6 C) 98.2 F (36.8 C) 98.2 F (36.8 C) 98.2 F (36.8 C)  TempSrc: Oral Oral Axillary Axillary  Resp: Weight:      SpO2: 100% 100% 100% 100%    Intake/Output Summary (Last 24 hours) at 05/01/15 1843 Last data filed at 05/01/15 1700  Gross per 24 hour  Intake   1200 ml  Output      0 ml  Net   1200 ml   Filed Weights   04/29/15 1940  Weight: 57 kg (125 lb 10.6 oz)    Exam:   General:  Awake, in nad  Cardiovascular: regular, s1, s2  Respiratory: normal resp effort, no wheezing  Abdomen: soft, nondistended  Musculoskeletal: perfused, no clubbing   Data Reviewed: Basic Metabolic Panel:  Recent Labs Lab 04/29/15 1721 04/30/15 0654 05/01/15 0644  NA 143 141 143  K 3.7 4.1 4.1  CL 108 111 111  CO2 23 21* 22  GLUCOSE 113* 88 88  BUN 39* 19 14  CREATININE 0.91 0.59 0.54  CALCIUM 9.1 8.2* 8.8*   Liver Function Tests:  Recent Labs Lab 04/29/15 1721  AST 45*  ALT 22  ALKPHOS 59  BILITOT 0.6  PROT 6.2*  ALBUMIN 2.9*   No results for input(s): LIPASE,  AMYLASE in the last 168 hours. No results for input(s): AMMONIA in the last 168 hours. CBC:  Recent Labs Lab 04/29/15 1721 04/30/15 0654 05/01/15 0644  WBC 18.1* 15.2* 13.6*  NEUTROABS 14.3*  --   --   HGB 14.6 12.4 12.9  HCT 43.9 38.2 39.0  MCV 86.8 85.7 86.1  PLT 212 189 205   Cardiac Enzymes: No results for input(s): CKTOTAL, CKMB, CKMBINDEX, TROPONINI in the last 168 hours. BNP (last 3 results) No results for input(s): BNP in the last 8760 hours.  ProBNP (last 3 results)  Recent Labs  09/03/14 0925  PROBNP 37.0    CBG: No results for input(s): GLUCAP in the last 168 hours.  Recent Results (from the past 240  hour(s))  Blood Culture (routine x 2)     Status: None (Preliminary result)   Collection Time: 04/29/15  4:55 PM  Result Value Ref Range Status   Specimen Description BLOOD LEFT ANTECUBITAL  Final   Special Requests   Final    BOTTLES DRAWN AEROBIC AND ANAEROBIC 10CC AER, 5CC ANA   Culture NO GROWTH 2 DAYS  Final   Report Status PENDING  Incomplete  Blood Culture (routine x 2)     Status: None (Preliminary result)   Collection Time: 04/29/15  5:08 PM  Result Value Ref Range Status   Specimen Description BLOOD LEFT HAND  Final   Special Requests BOTTLES DRAWN AEROBIC ONLY 10CC  Final   Culture NO GROWTH 2 DAYS  Final   Report Status PENDING  Incomplete  Urine culture     Status: None   Collection Time: 04/29/15  5:34 PM  Result Value Ref Range Status   Specimen Description URINE, RANDOM  Final   Special Requests NONE  Final   Culture MULTIPLE SPECIES PRESENT, SUGGEST RECOLLECTION  Final   Report Status 04/30/2015 FINAL  Final  Wet prep, genital     Status: Abnormal   Collection Time: 04/29/15  5:34 PM  Result Value Ref Range Status   Yeast Wet Prep HPF POC NONE SEEN NONE SEEN Final   Trich, Wet Prep NONE SEEN NONE SEEN Final   Clue Cells Wet Prep HPF POC NONE SEEN NONE SEEN Final   WBC, Wet Prep HPF POC FEW (A) NONE SEEN Final   Sperm NONE SEEN  Final  MRSA PCR Screening     Status: None   Collection Time: 05/01/15 11:11 AM  Result Value Ref Range Status   MRSA by PCR NEGATIVE NEGATIVE Final    Comment:        The GeneXpert MRSA Assay (FDA approved for NASAL specimens only), is one component of a comprehensive MRSA colonization surveillance program. It is not intended to diagnose MRSA infection nor to guide or monitor treatment for MRSA infections.      Studies: Mr Pelvis W Wo Contrast  04/30/2015  CLINICAL DATA:  Stage IV sacral decubitus ulcer with fever and elevated white blood count. Osteomyelitis of the sacrum. EXAM: MRI PELVIS WITHOUT AND WITH CONTRAST  TECHNIQUE: Multiplanar multisequence MR imaging of the pelvis was performed both before and after administration of intravenous contrast. CONTRAST:  10mL MULTIHANCE GADOBENATE DIMEGLUMINE 529 MG/ML IV SOLN COMPARISON:  MRI dated 11/15/2014 and CT scan dated 11/15/2014 FINDINGS: There is a midline sacral decubitus ulcer. The ulcer extends just superficial to the distal sacrum. There is either been resection or destruction of the fourth and fifth sacral segments and a partial resection of the proximal coccyx since the prior exam.  There is new enhancement of the gluteus maximus muscles adjacent to tear sacral attachments and just medial and lateral to the sacral decubitus ulcer, consistent with myositis. There is no definable abscess or current osteomyelitis. Ostomy is noted in the left lower quadrant. There is no adenopathy or mass lesion. There is atrophy of the muscles of the hips in both proximal thighs. Right hip prosthesis noted. IMPRESSION: 1. No evidence of current osteomyelitis or soft tissue abscess. 2. Deep sacral decubitus ulcer with adjacent focal myositis of the gluteus maximus muscles. I Electronically Signed   By: Francene Boyers M.D.   On: 04/30/2015 10:16    Scheduled Meds: . amLODipine  5 mg Oral Daily  . collagenase   Topical Daily  . donepezil  10 mg Oral QHS  . enoxaparin (LOVENOX) injection  40 mg Subcutaneous Daily  . feeding supplement  1 Container Oral BID BM  . feeding supplement (ENSURE ENLIVE)  237 mL Oral Q24H  . feeding supplement (PRO-STAT SUGAR FREE 64)  30 mL Oral TID WC  . losartan  50 mg Oral Daily  . megestrol  200 mg Oral BID  . metoprolol succinate  25 mg Oral Daily  . multivitamin with minerals  1 tablet Oral Daily   Continuous Infusions: . sodium chloride 100 mL/hr at 05/01/15 1550    Principal Problem:   Wound infection (HCC) Active Problems:   Decubitus ulcer of sacral region, stage 4 (HCC)   Osteomyelitis, pelvic region and thigh (HCC)   Osteomyelitis  due to staphylococcus aureus (HCC)   Malnutrition of moderate degree    CHIU, STEPHEN K  [facility] Pager [phone]. If 7PM-7AM, please contact night-coverage at [email or web address], password [facility] 05/01/2015, 6:43 PM  LOS: 2 days

## 2015-05-01 NOTE — Progress Notes (Signed)
Bridge removed from colostomy and wound evaluation to sacrum done. No role or surgery at this point  Recommend hydrotherapy and local wound care.  Will sign off at this point

## 2015-05-01 NOTE — Progress Notes (Signed)
Physical Therapy Wound Treatment Patient Details  Name: [name] Frank MRN: [id number] Date of Birth: 1934-05-28  Today's Date: 05/01/2015 Time: 6256-3893 Time Calculation (min): 33 min  Subjective  Subjective: Pt agreeable to treatment Patient and Family Stated Goals: Pt didn't state Date of Onset:  (prior to admission) Prior Treatments: VAC, moist guaze  Pain Score:  No signs of pain. Pt fell asleep during treatment  Wound Assessment  Wound / Incision (Open or Dehisced) 04/30/15 Other (Comment) Sacrum Medial Stage IV approximate quarter size (Active)  Dressing Type ABD;Barrier Film (skin prep);Gauze (Comment);Moist to dry 05/01/2015  8:57 AM  Dressing Changed Changed 05/01/2015  8:57 AM  Dressing Status Clean;Dry;Intact 05/01/2015  8:57 AM  Dressing Change Frequency Daily 05/01/2015  8:57 AM  Site / Wound Assessment Black;Pink;Red;Yellow 05/01/2015  8:57 AM  % Wound base Red or Granulating 85% 05/01/2015  8:57 AM  % Wound base Yellow 5% 05/01/2015  8:57 AM  % Wound base Black 10% 05/01/2015  8:57 AM  % Wound base Other (Comment) 0% 05/01/2015  8:57 AM  Peri-wound Assessment Maceration 05/01/2015  8:57 AM  Wound Length (cm) 4.5 cm 05/01/2015  8:57 AM  Wound Width (cm) 4 cm 05/01/2015  8:57 AM  Wound Depth (cm) 1.6 cm 05/01/2015  8:57 AM  Undermining (cm) up to 2.5 cm from 10 oclock to  12 oclock 05/01/2015  8:57 AM  Drainage Amount Minimal 05/01/2015  8:57 AM  Drainage Description Green 05/01/2015  8:57 AM  Non-staged Wound Description Not applicable 7/34/2876  8:11 PM  Treatment Debridement (Selective);Hydrotherapy (Pulse lavage);Packing (Saline gauze) 05/01/2015  8:57 AM   Hydrotherapy Pulsed lavage therapy - wound location: sacrum Pulsed Lavage with Suction (psi): 4 psi (4-8) Pulsed Lavage with Suction - Normal Saline Used: 1000 mL Pulsed Lavage Tip: Tip with splash shield Selective Debridement Selective Debridement - Location: sacrum Selective Debridement - Tools Used:  Forceps Selective Debridement - Tissue Removed: unable to remove adhered tissue   Wound Assessment and Plan  Wound Therapy - Assess/Plan/Recommendations Wound Therapy - Clinical Statement: Pt presents to hydrotherapy with chronic sacral wound. Can benefit from hydrotherapy to debride wound and decrease bioburden.  Wound Therapy - Functional Problem List: Decr time out of bed Factors Delaying/Impairing Wound Healing: Immobility;Polypharmacy;Multiple medical problems Hydrotherapy Plan: Debridement;Dressing change;Patient/family education;Pulsatile lavage with suction Wound Therapy - Frequency: 6X / week Wound Therapy - Follow Up Recommendations: Skilled nursing facility Wound Plan: See above  Wound Therapy Goals- Improve the function of patient's integumentary system by progressing the wound(s) through the phases of wound healing (inflammation - proliferation - remodeling) by: Decrease Necrotic Tissue to: 0 Decrease Necrotic Tissue - Progress: Goal set today Increase Granulation Tissue to: 100 Increase Granulation Tissue - Progress: Goal set today  Goals will be updated until maximal potential achieved or discharge criteria met.  Discharge criteria: when goals achieved, discharge from hospital, MD decision/surgical intervention, no progress towards goals, refusal/missing three consecutive treatments without notification or medical reason.  GP     Sheresa Cullop 05/01/2015, 9:33 AM [facility] PT [phone]

## 2015-05-02 DIAGNOSIS — A419 Sepsis, unspecified organism: Secondary | ICD-10-CM | POA: Diagnosis not present

## 2015-05-02 DIAGNOSIS — M8638 Chronic multifocal osteomyelitis, other site: Secondary | ICD-10-CM | POA: Diagnosis not present

## 2015-05-02 DIAGNOSIS — R627 Adult failure to thrive: Secondary | ICD-10-CM | POA: Diagnosis not present

## 2015-05-02 DIAGNOSIS — E440 Moderate protein-calorie malnutrition: Secondary | ICD-10-CM | POA: Diagnosis not present

## 2015-05-02 DIAGNOSIS — T148 Other injury of unspecified body region: Secondary | ICD-10-CM | POA: Diagnosis not present

## 2015-05-02 DIAGNOSIS — L089 Local infection of the skin and subcutaneous tissue, unspecified: Secondary | ICD-10-CM | POA: Diagnosis not present

## 2015-05-02 DIAGNOSIS — Z933 Colostomy status: Secondary | ICD-10-CM | POA: Diagnosis not present

## 2015-05-02 DIAGNOSIS — I10 Essential (primary) hypertension: Secondary | ICD-10-CM | POA: Diagnosis not present

## 2015-05-02 DIAGNOSIS — R000 Tachycardia, unspecified: Secondary | ICD-10-CM | POA: Diagnosis not present

## 2015-05-02 DIAGNOSIS — R1319 Other dysphagia: Secondary | ICD-10-CM | POA: Diagnosis not present

## 2015-05-02 DIAGNOSIS — N390 Urinary tract infection, site not specified: Secondary | ICD-10-CM | POA: Diagnosis not present

## 2015-05-02 DIAGNOSIS — M869 Osteomyelitis, unspecified: Secondary | ICD-10-CM | POA: Diagnosis not present

## 2015-05-02 DIAGNOSIS — Z79899 Other long term (current) drug therapy: Secondary | ICD-10-CM | POA: Diagnosis not present

## 2015-05-02 DIAGNOSIS — M6281 Muscle weakness (generalized): Secondary | ICD-10-CM | POA: Diagnosis not present

## 2015-05-02 DIAGNOSIS — F419 Anxiety disorder, unspecified: Secondary | ICD-10-CM | POA: Diagnosis present

## 2015-05-02 DIAGNOSIS — I96 Gangrene, not elsewhere classified: Secondary | ICD-10-CM | POA: Diagnosis not present

## 2015-05-02 DIAGNOSIS — E785 Hyperlipidemia, unspecified: Secondary | ICD-10-CM | POA: Diagnosis not present

## 2015-05-02 DIAGNOSIS — L270 Generalized skin eruption due to drugs and medicaments taken internally: Secondary | ICD-10-CM | POA: Diagnosis not present

## 2015-05-02 DIAGNOSIS — E46 Unspecified protein-calorie malnutrition: Secondary | ICD-10-CM | POA: Diagnosis not present

## 2015-05-02 DIAGNOSIS — Z7401 Bed confinement status: Secondary | ICD-10-CM | POA: Diagnosis not present

## 2015-05-02 DIAGNOSIS — T360X5A Adverse effect of penicillins, initial encounter: Secondary | ICD-10-CM | POA: Diagnosis not present

## 2015-05-02 DIAGNOSIS — Z8614 Personal history of Methicillin resistant Staphylococcus aureus infection: Secondary | ICD-10-CM | POA: Diagnosis not present

## 2015-05-02 DIAGNOSIS — K219 Gastro-esophageal reflux disease without esophagitis: Secondary | ICD-10-CM | POA: Diagnosis not present

## 2015-05-02 DIAGNOSIS — B999 Unspecified infectious disease: Secondary | ICD-10-CM | POA: Diagnosis not present

## 2015-05-02 DIAGNOSIS — Z7189 Other specified counseling: Secondary | ICD-10-CM | POA: Diagnosis not present

## 2015-05-02 DIAGNOSIS — Z66 Do not resuscitate: Secondary | ICD-10-CM | POA: Diagnosis not present

## 2015-05-02 DIAGNOSIS — R2689 Other abnormalities of gait and mobility: Secondary | ICD-10-CM | POA: Diagnosis not present

## 2015-05-02 DIAGNOSIS — E43 Unspecified severe protein-calorie malnutrition: Secondary | ICD-10-CM | POA: Diagnosis not present

## 2015-05-02 DIAGNOSIS — Z6821 Body mass index (BMI) 21.0-21.9, adult: Secondary | ICD-10-CM | POA: Diagnosis not present

## 2015-05-02 DIAGNOSIS — R488 Other symbolic dysfunctions: Secondary | ICD-10-CM | POA: Diagnosis not present

## 2015-05-02 DIAGNOSIS — Z96649 Presence of unspecified artificial hip joint: Secondary | ICD-10-CM | POA: Diagnosis not present

## 2015-05-02 DIAGNOSIS — F0390 Unspecified dementia without behavioral disturbance: Secondary | ICD-10-CM | POA: Diagnosis present

## 2015-05-02 DIAGNOSIS — Z96641 Presence of right artificial hip joint: Secondary | ICD-10-CM | POA: Diagnosis not present

## 2015-05-02 DIAGNOSIS — Z87891 Personal history of nicotine dependence: Secondary | ICD-10-CM | POA: Diagnosis not present

## 2015-05-02 DIAGNOSIS — Z515 Encounter for palliative care: Secondary | ICD-10-CM | POA: Diagnosis not present

## 2015-05-02 DIAGNOSIS — L89154 Pressure ulcer of sacral region, stage 4: Secondary | ICD-10-CM | POA: Diagnosis not present

## 2015-05-02 DIAGNOSIS — L98499 Non-pressure chronic ulcer of skin of other sites with unspecified severity: Secondary | ICD-10-CM | POA: Diagnosis present

## 2015-05-02 DIAGNOSIS — Z9181 History of falling: Secondary | ICD-10-CM | POA: Diagnosis not present

## 2015-05-02 DIAGNOSIS — M1990 Unspecified osteoarthritis, unspecified site: Secondary | ICD-10-CM | POA: Diagnosis not present

## 2015-05-02 DIAGNOSIS — R7881 Bacteremia: Secondary | ICD-10-CM | POA: Diagnosis not present

## 2015-05-02 DIAGNOSIS — M868X8 Other osteomyelitis, other site: Secondary | ICD-10-CM | POA: Diagnosis not present

## 2015-05-02 LAB — CBC
HCT: 37.8 % (ref 36.0–46.0)
HEMOGLOBIN: 12.4 g/dL (ref 12.0–15.0)
MCH: 28 pg (ref 26.0–34.0)
MCHC: 32.8 g/dL (ref 30.0–36.0)
MCV: 85.3 fL (ref 78.0–100.0)
PLATELETS: 208 10*3/uL (ref 150–400)
RBC: 4.43 MIL/uL (ref 3.87–5.11)
RDW: 16.1 % — ABNORMAL HIGH (ref 11.5–15.5)
WBC: 14.2 10*3/uL — AB (ref 4.0–10.5)

## 2015-05-02 LAB — BASIC METABOLIC PANEL
Anion gap: 9 (ref 5–15)
BUN: 17 mg/dL (ref 6–20)
CHLORIDE: 110 mmol/L (ref 101–111)
CO2: 22 mmol/L (ref 22–32)
CREATININE: 0.54 mg/dL (ref 0.44–1.00)
Calcium: 8.6 mg/dL — ABNORMAL LOW (ref 8.9–10.3)
GFR calc Af Amer: >60 mL/min (ref >60)
GFR calc non Af Amer: >60 mL/min (ref >60)
GLUCOSE: 91 mg/dL (ref 65–99)
Potassium: 3.6 mmol/L (ref 3.5–5.1)
SODIUM: 141 mmol/L (ref 135–145)

## 2015-05-02 MED ORDER — COLLAGENASE 250 UNIT/GM EX OINT: TOPICAL_OINTMENT | Freq: Every day | CUTANEOUS | Status: DC

## 2015-05-02 NOTE — NC FL2 (Signed)
[place] MEDICAID FL2 LEVEL OF CARE SCREENING TOOL     IDENTIFICATION  Patient Name: [name] Frank Birthdate: 02-Feb-1935 Sex: female Admission Date (Current Location): 04/29/2015  [facility] and [place] Number:  Producer, television/film/video and Address:  [facility], [address] [place], [place] [address]      Provider Number: [id number]  Attending Physician Name and Address:  Jerald Kief, MD  Relative Name and Phone Number:       Current Level of Care: Hospital Recommended Level of Care: Skilled Nursing Facility Prior Approval Number:    Date Approved/Denied:   PASRR Number:    Discharge Plan: SNF    Current Diagnoses: Patient Active Problem List   Diagnosis Date Noted  . Malnutrition of moderate degree 05/01/2015  . Wound infection (HCC) 04/29/2015  . Colostomy in place [facility]) 01/06/2015  . Catheter-associated urinary tract infection (HCC) 11/26/2014  . Osteomyelitis due to staphylococcus aureus (HCC) 11/25/2014  . Bacteremia 11/25/2014  . Postprocedural fever 11/24/2014  . UTI (urinary tract infection) 11/24/2014  . Palliative care encounter 11/22/2014  . DNR (do not resuscitate) discussion 11/22/2014  . Osteomyelitis, pelvic region and thigh (HCC)   . Goals of care, counseling/discussion   . Enterococcus faecalis infection   . Protein-calorie malnutrition, severe (HCC) 11/15/2014  . Decubitus ulcer of sacral region, stage 4 (HCC) 11/14/2014  . Stage IV decubitus ulcer (HCC) 11/14/2014  . Cognitive changes 06/19/2014  . GERD (gastroesophageal reflux disease) 08/17/2013  . Postoperative anemia due to acute blood loss 01/08/2012  . Osteoarthritis 09/23/2010  . Lumbago 03/23/2010  . OVERACTIVE BLADDER 12/16/2009  . EDEMA 04/25/2009  . DEGENERATIVE JOINT DISEASE, HIPS 09/20/2008  . HYPERLIPIDEMIA 07/10/2008  . Essential hypertension 07/10/2008  . URINARY INCONTINENCE 07/10/2008    Orientation RESPIRATION BLADDER Height & Weight      Self  Normal Incontinent Weight: 125 lb 10.6 oz (57 kg) Height:     BEHAVIORAL SYMPTOMS/MOOD NEUROLOGICAL BOWEL NUTRITION STATUS   (NONE )  (NONE ) Continent Diet (HEART HEATHY )  AMBULATORY STATUS COMMUNICATION OF NEEDS Skin   Limited Assist Verbally PU Stage and Appropriate Care       PU Stage 4 Dressing: Daily               Personal Care Assistance Level of Assistance  Bathing, Feeding, Dressing Bathing Assistance: Maximum assistance Feeding assistance: Independent Dressing Assistance: Maximum assistance     Functional Limitations Info  Sight, Hearing, Speech Sight Info: Adequate Hearing Info: Adequate Speech Info: Adequate    SPECIAL CARE FACTORS FREQUENCY  PT (By licensed PT)     PT Frequency: 6X wound care               Contractures      Additional Factors Info  Code Status, Allergies Code Status Info: FULL CODE  Allergies Info:  (Augmentin, Codeine Sulfate, Tramadol Hcl)           Current Medications (05/02/2015):  This is the current hospital active medication list Current Facility-Administered Medications  Medication Dose Route Frequency Provider Last Rate Last Dose  . 0.9 %  sodium chloride infusion   Intravenous Continuous Hillary Bow, DO 100 mL/hr at 05/01/15 1550    . amLODipine (NORVASC) tablet 5 mg  5 mg Oral Daily Hillary Bow, DO   5 mg at 05/02/15 8119  . collagenase (SANTYL) ointment   Topical Daily Jerald Kief, MD      . donepezil (ARICEPT)  tablet 10 mg  10 mg Oral QHS Hillary Bow, DO   10 mg at 05/01/15 2207  . enoxaparin (LOVENOX) injection 40 mg  40 mg Subcutaneous Daily Hillary Bow, DO   40 mg at 05/02/15 0831  . feeding supplement (BOOST / RESOURCE BREEZE) liquid 1 Container  1 Container Oral BID BM Jerald Kief, MD   1 Container at 05/02/15 1439  . feeding supplement (ENSURE ENLIVE) (ENSURE ENLIVE) liquid 237 mL  237 mL Oral Q24H Jerald Kief, MD   237 mL at 05/01/15 2210  . feeding supplement (PRO-STAT  SUGAR FREE 64) liquid 30 mL  30 mL Oral TID WC Hillary Bow, DO   30 mL at 05/02/15 1439  . losartan (COZAAR) tablet 50 mg  50 mg Oral Daily Hillary Bow, DO   50 mg at 05/02/15 1610  . megestrol (MEGACE) 40 MG/ML suspension 200 mg  200 mg Oral BID Hillary Bow, DO   200 mg at 05/02/15 1039  . metoprolol succinate (TOPROL-XL) 24 hr tablet 25 mg  25 mg Oral Daily Hillary Bow, DO   25 mg at 05/02/15 9604  . multivitamin with minerals tablet 1 tablet  1 tablet Oral Daily Jerald Kief, MD   1 tablet at 05/02/15 5409     Discharge Medications: Please see discharge summary for a list of discharge medications.  Relevant Imaging Results:  Relevant Lab Results:   Additional Information SSN [id number]  Derenda Fennel, MSW, LCSWA [phone] 05/02/2015 3:31 PM

## 2015-05-02 NOTE — Discharge Summary (Signed)
Physician Discharge Summary  [name] Frank ZOX:[id number] DOB: 12/02/1934 DOA: 04/29/2015  PCP: Kristian Covey, MD  Admit date: 04/29/2015 Discharge date: 05/02/2015  Time spent: 20 minutes  Recommendations for Outpatient Follow-up:  1. Follow up with PCP in 1-2 weeks 2. Follow up with outpatient wound clinic on 2/22 at 2:15pm 3. Please ensure frequent rotation to keep pressure off decub ulcer 4. Please monitor decub ulcer for signs of infection 5. Wound Care instructions: Wound appearance has improved and hydrotherapy is no longer needed at this time Patient can continue Santyl for enzymatic debridement to assist with removal of nonviable tissue and should continue use of an air mattress for pressure reduction when transferred back to the SNF  Discharge Diagnoses:  Principal Problem:   Wound infection (HCC) Active Problems:   Decubitus ulcer of sacral region, stage 4 (HCC)   Osteomyelitis, pelvic region and thigh (HCC)   Osteomyelitis due to staphylococcus aureus (HCC)   Malnutrition of moderate degree   Discharge Condition: Stable  Diet recommendation: Heart Healthy  Filed Weights   04/29/15 1940  Weight: 57 kg (125 lb 10.6 oz)    History of present illness:  Please review dictated H and P from 2/14 for details. Briefly, 81 y.o. female with h/o dementia, stage 4 sacral decubitus with osteomyelitis of sacrum with MRSA and 1/2 BCx positive in sept. Patient presents to the ED with family for evaluation of fever and elevated WBC. Patient is being treated with wound VAC, completed course of doxycycline about 4 days prior to admission  Hospital Course:  1. Stage 4 sacral decubitus - Initial suspicion for infection vs osteomyelitis especially with known MRSA osteomyelitis in Sept last year 1. Pt was started on empiric vancomycin 2. MRI was neg for osteomyelitis 3. BCx neg x 2 days 4. Wet prep with few WBC but patient asymptomatic currently 5. Surgery was consulted. Recs  for hydrotherapy and recs to d/c empiric abx 6. Per family, patient has been refusing PT at SNF. Reminded pt and family that if pt does not participate in physical therapy, then chance of meaningful recovery/strengthening would be bleak. Patient seems to understand 7. Wound Nurse consulted. Patient completed hydrotherapy. Wound care instructions per above 8. Patient remained afebrile off antibiotics 2. Dementia with adult failure to thrive - 1. Likely needs repeat palliative care consult, dosent sound like family was ready for this last time. 3. DVT prophylaxis 1. Lovenox subQ while admitted 4. Ostomy site 1. Surgery consulted and bridge now removed  Consultations:  WOC  General Surgery  Discharge Exam: Filed Vitals:   05/01/15 2213 05/02/15 0133 05/02/15 0719 05/02/15 1003  BP: 131/81 149/81 124/63 139/61  Pulse: 89 76 75 85  Temp: 98.1 F (36.7 C) 98.6 F (37 C) 97.7 F (36.5 C) 97.6 F (36.4 C)  TempSrc: Axillary  Oral Oral  Resp: 18 18 18 18   Weight:      SpO2: 97% 96% 97% 100%    General: Awake, in nad Cardiovascular: regular, s1, s2 Respiratory: normal resp effort, no wheezing  Discharge Instructions     Medication List    TAKE these medications        amLODipine 5 MG tablet  Commonly known as:  NORVASC  Take 5 mg by mouth daily.     ascorbic acid 500 MG tablet  Commonly known as:  VITAMIN C  Take 1 tablet (500 mg total) by mouth 2 (two) times daily.     CERTAGEN tablet  Take 1 tablet by  mouth daily.     collagenase ointment  Commonly known as:  SANTYL  Apply topically daily.     donepezil 10 MG tablet  Commonly known as:  ARICEPT  Take 1 tablet (10 mg total) by mouth at bedtime.     feeding supplement (PRO-STAT SUGAR FREE 64) Liqd  Take 30 mLs by mouth 3 (three) times daily with meals.     feeding supplement Liqd  Take 1 Container by mouth 3 (three) times daily between meals.     furosemide 20 MG tablet  Commonly known as:  LASIX  TAKE 1  TABLET (20 MG TOTAL) BY MOUTH DAILY.     losartan 50 MG tablet  Commonly known as:  COZAAR  TAKE 1 TABLET BY MOUTH DAILY     megestrol 40 MG/ML suspension  Commonly known as:  MEGACE ORAL  One tsp po bid     metoprolol succinate 25 MG 24 hr tablet  Commonly known as:  TOPROL-XL  Take 1 tablet (25 mg total) by mouth daily.     vitamin B-12 500 MCG tablet  Commonly known as:  CYANOCOBALAMIN  Take 500 mcg by mouth daily.       Allergies  Allergen Reactions  . Augmentin [Amoxicillin-Pot Clavulanate]     Per MAR  . Codeine Sulfate Other (See Comments)    stomach pains  . Tramadol Hcl Other (See Comments)    stomach cramps      The results of significant diagnostics from this hospitalization (including imaging, microbiology, ancillary and laboratory) are listed below for reference.    Significant Diagnostic Studies: Mr Pelvis W Wo Contrast  04/30/2015  CLINICAL DATA:  Stage IV sacral decubitus ulcer with fever and elevated white blood count. Osteomyelitis of the sacrum. EXAM: MRI PELVIS WITHOUT AND WITH CONTRAST TECHNIQUE: Multiplanar multisequence MR imaging of the pelvis was performed both before and after administration of intravenous contrast. CONTRAST:  10mL MULTIHANCE GADOBENATE DIMEGLUMINE 529 MG/ML IV SOLN COMPARISON:  MRI dated 11/15/2014 and CT scan dated 11/15/2014 FINDINGS: There is a midline sacral decubitus ulcer. The ulcer extends just superficial to the distal sacrum. There is either been resection or destruction of the fourth and fifth sacral segments and a partial resection of the proximal coccyx since the prior exam. There is new enhancement of the gluteus maximus muscles adjacent to tear sacral attachments and just medial and lateral to the sacral decubitus ulcer, consistent with myositis. There is no definable abscess or current osteomyelitis. Ostomy is noted in the left lower quadrant. There is no adenopathy or mass lesion. There is atrophy of the muscles of the  hips in both proximal thighs. Right hip prosthesis noted. IMPRESSION: 1. No evidence of current osteomyelitis or soft tissue abscess. 2. Deep sacral decubitus ulcer with adjacent focal myositis of the gluteus maximus muscles. I Electronically Signed   By: Francene Boyers M.D.   On: 04/30/2015 10:16   Dg Chest Port 1 View  04/29/2015  CLINICAL DATA:  81 year old female with fever and wound infection EXAM: PORTABLE CHEST 1 VIEW COMPARISON:  Prior chest x-ray 11/24/2014 FINDINGS: Low inspiratory volumes with mild bibasilar atelectasis. Stable cardiac and mediastinal contours. Atherosclerotic calcifications throughout the thoracic aorta. Stable mild chronic bronchitic change. No pulmonary edema, focal consolidation or pneumothorax. No acute osseous abnormality. IMPRESSION: 1. Low inspiratory volumes with mild bibasilar atelectasis. 2. Otherwise, stable chest x-ray without acute cardiopulmonary process. Electronically Signed   By: Malachy Moan M.D.   On: 04/29/2015 16:44    Microbiology:  Recent Results (from the past 240 hour(s))  Blood Culture (routine x 2)     Status: None (Preliminary result)   Collection Time: 04/29/15  4:55 PM  Result Value Ref Range Status   Specimen Description BLOOD LEFT ANTECUBITAL  Final   Special Requests   Final    BOTTLES DRAWN AEROBIC AND ANAEROBIC 10CC AER, 5CC ANA   Culture NO GROWTH 2 DAYS  Final   Report Status PENDING  Incomplete  Blood Culture (routine x 2)     Status: None (Preliminary result)   Collection Time: 04/29/15  5:08 PM  Result Value Ref Range Status   Specimen Description BLOOD LEFT HAND  Final   Special Requests BOTTLES DRAWN AEROBIC ONLY 10CC  Final   Culture NO GROWTH 2 DAYS  Final   Report Status PENDING  Incomplete  Urine culture     Status: None   Collection Time: 04/29/15  5:34 PM  Result Value Ref Range Status   Specimen Description URINE, RANDOM  Final   Special Requests NONE  Final   Culture MULTIPLE SPECIES PRESENT, SUGGEST  RECOLLECTION  Final   Report Status 04/30/2015 FINAL  Final  Wet prep, genital     Status: Abnormal   Collection Time: 04/29/15  5:34 PM  Result Value Ref Range Status   Yeast Wet Prep HPF POC NONE SEEN NONE SEEN Final   Trich, Wet Prep NONE SEEN NONE SEEN Final   Clue Cells Wet Prep HPF POC NONE SEEN NONE SEEN Final   WBC, Wet Prep HPF POC FEW (A) NONE SEEN Final   Sperm NONE SEEN  Final  MRSA PCR Screening     Status: None   Collection Time: 05/01/15 11:11 AM  Result Value Ref Range Status   MRSA by PCR NEGATIVE NEGATIVE Final    Comment:        The GeneXpert MRSA Assay (FDA approved for NASAL specimens only), is one component of a comprehensive MRSA colonization surveillance program. It is not intended to diagnose MRSA infection nor to guide or monitor treatment for MRSA infections.      Labs: Basic Metabolic Panel:  Recent Labs Lab 04/29/15 1721 04/30/15 0654 05/01/15 0644 05/02/15 0545  NA 143 141 143 141  K 3.7 4.1 4.1 3.6  CL 108 111 111 110  CO2 23 21* 22 22  GLUCOSE 113* 88 88 91  BUN 39* CREATININE 0.91 0.59 0.54 0.54  CALCIUM 9.1 8.2* 8.8* 8.6*   Liver Function Tests:  Recent Labs Lab 04/29/15 1721  AST 45*  ALT 22  ALKPHOS 59  BILITOT 0.6  PROT 6.2*  ALBUMIN 2.9*   No results for input(s): LIPASE, AMYLASE in the last 168 hours. No results for input(s): AMMONIA in the last 168 hours. CBC:  Recent Labs Lab 04/29/15 1721 04/30/15 0654 05/01/15 0644 05/02/15 0545  WBC 18.1* 15.2* 13.6* 14.2*  NEUTROABS 14.3*  --   --   --   HGB 14.6 12.4 12.9 12.4  HCT 43.9 38.2 39.0 37.8  MCV 86.8 85.7 86.1 85.3  PLT 212 189 205 208   Cardiac Enzymes: No results for input(s): CKTOTAL, CKMB, CKMBINDEX, TROPONINI in the last 168 hours. BNP: BNP (last 3 results) No results for input(s): BNP in the last 8760 hours.  ProBNP (last 3 results)  Recent Labs  09/03/14 0925  PROBNP 37.0    CBG:  Recent Labs Lab 05/01/15 2207  GLUCAP  88    Signed:  Kylena Mole  K  [facility] 05/02/2015, 1:18 PM

## 2015-05-02 NOTE — Progress Notes (Signed)
Physical Therapy Wound Treatment Patient Details  Name: [name] Frank MRN: [id number] Date of Birth: Jun 05, 1934  Today's Date: 05/02/2015 Time: 6010-9323 Time Calculation (min): 21 min  Subjective  Subjective: Pt stated she enjoyed her breakfast Patient and Family Stated Goals: Pt didn't state Date of Onset:  (prior to admission) Prior Treatments: VAC, moist guaze  Pain Score:  No signs of pain. Pt fell asleep during treatment.  Wound Assessment  Wound / Incision (Open or Dehisced) 04/30/15 Other (Comment) Sacrum Medial Stage IV approximate quarter size (Active)  Dressing Type ABD;Barrier Film (skin prep);Gauze (Comment);Moist to dry 05/02/2015  9:22 AM  Dressing Changed Changed 05/02/2015  9:22 AM  Dressing Status Clean;Dry;Intact 05/02/2015  9:22 AM  Dressing Change Frequency Daily 05/02/2015  9:22 AM  Site / Wound Assessment Pink;Red;Yellow;Brown 05/02/2015  9:22 AM  % Wound base Red or Granulating 95% 05/02/2015  9:22 AM  % Wound base Yellow Less than 2% 05/01/2015  8:57 AM  % Wound base Black Less than 2% 05/01/2015  8:57 AM  % Wound base Other (Comment) 0% 05/02/2015  9:22 AM  Peri-wound Assessment Maceration 05/02/2015  9:22 AM  Wound Length (cm) 4.5 cm 05/01/2015  8:57 AM  Wound Width (cm) 4 cm 05/01/2015  8:57 AM  Wound Depth (cm) 1.6 cm 05/01/2015  8:57 AM  Undermining (cm) up to 2.5 cm from 10 oclock to  12 oclock 05/01/2015  8:57 AM  Drainage Amount Minimal 05/02/2015  9:22 AM  Drainage Description No odor;Serous 05/02/2015  9:22 AM  Non-staged Wound Description Not applicable 5/57/3220  2:54 AM  Treatment Hydrotherapy (Pulse lavage);Packing (Saline gauze) 05/02/2015  9:22 AM   Hydrotherapy Pulsed lavage therapy - wound location: sacrum Pulsed Lavage with Suction (psi): 8 psi (4-8) Pulsed Lavage with Suction - Normal Saline Used: 1000 mL Pulsed Lavage Tip: Tip with splash shield   Wound Assessment and Plan  Wound Therapy - Assess/Plan/Recommendations Wound Therapy -  Clinical Statement: Wound bed looking very good. Little necrotic tissue present. Likely can dc hydrotherapy very soon.  Wound Therapy - Functional Problem List: Decr time out of bed Factors Delaying/Impairing Wound Healing: Immobility;Polypharmacy;Multiple medical problems Hydrotherapy Plan: Debridement;Dressing change;Patient/family education;Pulsatile lavage with suction Wound Therapy - Frequency: 6X / week Wound Therapy - Follow Up Recommendations: Skilled nursing facility Wound Plan: See above  Wound Therapy Goals- Improve the function of patient's integumentary system by progressing the wound(s) through the phases of wound healing (inflammation - proliferation - remodeling) by: Decrease Necrotic Tissue to: 0 Decrease Necrotic Tissue - Progress: Met Increase Granulation Tissue to: 100 Increase Granulation Tissue - Progress: Progressing toward goal  Goals will be updated until maximal potential achieved or discharge criteria met.  Discharge criteria: when goals achieved, discharge from hospital, MD decision/surgical intervention, no progress towards goals, refusal/missing three consecutive treatments without notification or medical reason.  GP     [name] Frank 05/02/2015, 9:32 AM [facility] PT [phone]

## 2015-05-02 NOTE — Progress Notes (Signed)
CSW was advised to speak to family regarding plans for disposition. CSW introduced self and acknowledged the patient. Patient appeared to be very restless. Patient's husband [name] Frank and [name] is at bedside. Family informed CSW that they are interested in seeking another SNF for the patient. Patient is from [facility] and has been there for the past 4 months.  Family reports the patient is not getting the therapy she needs at the facility. CSW informed family that patient is medically stable and ready for discharge on today. Family is encouraged to collaborate with the CSW at the facility for possible transfer to another facility. Family spoke about their frustration with the facility not having adequate staffing to care for the patients. CSW provided brief counseling and encouragement to the family. Patient's family was very appreciative of CSW services and intervention.   Family is agreeable to the patient returning back to [facility] and will explore other options with CSW at facility.  No further needs reported at this time.    Fernande Boyden, LCSWA Clinical Social Worker [facility] Ph: [phone]

## 2015-05-02 NOTE — Progress Notes (Signed)
Pt was discharged to go to SNF, [facility] came to pick up pt, report was earlier called and given by day RN, pt left at 2038 with personal belongings. Obasogie-Asidi, Jemuel Laursen Efe

## 2015-05-02 NOTE — Clinical Social Work Note (Signed)
Clinical Social Worker facilitated patient discharge including contacting patient family (at bedside) and facility to confirm patient discharge plans.  Clinical information faxed to facility and family agreeable with plan.  CSW arranged ambulance transport via [facility] to [facility].  RN to call report prior to discharge.  Full psychosocial assessment to follow by different CSW.   Clinical Social Worker will sign off for now as social work intervention is no longer needed. Please consult [place] again if new need arises.  Derenda Fennel, MSW, LCSWA [phone] 05/02/2015 3:44 PM

## 2015-05-02 NOTE — Care Management Note (Signed)
Case Management Note  Patient Details  Name: [name] Frank MRN: [id number] Date of Birth: 07-Apr-1934  Subjective/Objective:                    Action/Plan: Patient discharging back to [facility] today. No further needs per CM.   Expected Discharge Date:                  Expected Discharge Plan:  Skilled Nursing Facility  In-House Referral:     Discharge planning Services     Post Acute Care Choice:    Choice offered to:     DME Arranged:    DME Agency:     HH Arranged:    HH Agency:     Status of Service:  Completed, signed off  Medicare Important Message Given:    Date Medicare IM Given:    Medicare IM give by:    Date Additional Medicare IM Given:    Additional Medicare Important Message give by:     If discussed at Long Length of Stay Meetings, dates discussed:    Additional Comments:  Kermit Balo, RN 05/02/2015, 4:29 PM

## 2015-05-02 NOTE — Consult Note (Addendum)
WOC follow-up: According to physical therapy, wound appearance has improved and hydrotherapy is no longer needed at this time; refer to their progress notes for percentages and appearance of sacral wound.  Pt can continue Santyl for enzymatic debridement to assist with removal of nonviable tissue and should continue use of an air mattress for pressure reduction when transferred back to the SNF. Please re-consult if further assistance is needed.  Thank-you,  Cammie Mcgee MSN, RN, CWOCN, [place], CNS [phone]

## 2015-05-04 LAB — CULTURE, BLOOD (ROUTINE X 2)
Culture: NO GROWTH
Culture: NO GROWTH

## 2015-05-07 DIAGNOSIS — I10 Essential (primary) hypertension: Secondary | ICD-10-CM | POA: Diagnosis not present

## 2015-05-07 DIAGNOSIS — L89154 Pressure ulcer of sacral region, stage 4: Secondary | ICD-10-CM | POA: Diagnosis not present

## 2015-05-07 DIAGNOSIS — Z96649 Presence of unspecified artificial hip joint: Secondary | ICD-10-CM | POA: Diagnosis not present

## 2015-05-07 DIAGNOSIS — M1990 Unspecified osteoarthritis, unspecified site: Secondary | ICD-10-CM | POA: Diagnosis not present

## 2015-05-07 DIAGNOSIS — Z933 Colostomy status: Secondary | ICD-10-CM | POA: Diagnosis not present

## 2015-05-07 DIAGNOSIS — M8638 Chronic multifocal osteomyelitis, other site: Secondary | ICD-10-CM | POA: Diagnosis not present

## 2015-05-08 DIAGNOSIS — M869 Osteomyelitis, unspecified: Secondary | ICD-10-CM | POA: Diagnosis not present

## 2015-05-08 DIAGNOSIS — L89154 Pressure ulcer of sacral region, stage 4: Secondary | ICD-10-CM | POA: Diagnosis not present

## 2015-05-08 DIAGNOSIS — N390 Urinary tract infection, site not specified: Secondary | ICD-10-CM | POA: Diagnosis not present

## 2015-05-08 DIAGNOSIS — I10 Essential (primary) hypertension: Secondary | ICD-10-CM | POA: Diagnosis not present

## 2015-05-13 ENCOUNTER — Ambulatory Visit: Payer: Medicare Other | Admitting: Family Medicine

## 2015-05-14 ENCOUNTER — Encounter (HOSPITAL_BASED_OUTPATIENT_CLINIC_OR_DEPARTMENT_OTHER): Payer: Medicare Other | Attending: Surgery

## 2015-05-14 ENCOUNTER — Inpatient Hospital Stay (HOSPITAL_COMMUNITY)
Admission: EM | Admit: 2015-05-14 | Discharge: 2015-05-20 | DRG: 871 | Disposition: A | Discharge status: Skilled Nursing Facility | Payer: Medicare Other | Attending: Internal Medicine | Admitting: Internal Medicine

## 2015-05-14 ENCOUNTER — Ambulatory Visit (INDEPENDENT_AMBULATORY_CARE_PROVIDER_SITE_OTHER): Payer: Medicare Other | Admitting: Family Medicine

## 2015-05-14 ENCOUNTER — Encounter (HOSPITAL_COMMUNITY): Payer: Self-pay | Admitting: Emergency Medicine

## 2015-05-14 ENCOUNTER — Encounter: Payer: Self-pay | Admitting: Family Medicine

## 2015-05-14 VITALS — BP 130/70 | HR 107 | Temp 98.1°F

## 2015-05-14 DIAGNOSIS — Z8614 Personal history of Methicillin resistant Staphylococcus aureus infection: Secondary | ICD-10-CM | POA: Insufficient documentation

## 2015-05-14 DIAGNOSIS — R0602 Shortness of breath: Secondary | ICD-10-CM | POA: Diagnosis not present

## 2015-05-14 DIAGNOSIS — E785 Hyperlipidemia, unspecified: Secondary | ICD-10-CM | POA: Diagnosis present

## 2015-05-14 DIAGNOSIS — R000 Tachycardia, unspecified: Secondary | ICD-10-CM | POA: Diagnosis not present

## 2015-05-14 DIAGNOSIS — S31809A Unspecified open wound of unspecified buttock, initial encounter: Secondary | ICD-10-CM | POA: Diagnosis not present

## 2015-05-14 DIAGNOSIS — M869 Osteomyelitis, unspecified: Secondary | ICD-10-CM | POA: Diagnosis not present

## 2015-05-14 DIAGNOSIS — R627 Adult failure to thrive: Secondary | ICD-10-CM | POA: Diagnosis not present

## 2015-05-14 DIAGNOSIS — L89159 Pressure ulcer of sacral region, unspecified stage: Secondary | ICD-10-CM | POA: Diagnosis present

## 2015-05-14 DIAGNOSIS — Z933 Colostomy status: Secondary | ICD-10-CM

## 2015-05-14 DIAGNOSIS — L89154 Pressure ulcer of sacral region, stage 4: Secondary | ICD-10-CM | POA: Diagnosis not present

## 2015-05-14 DIAGNOSIS — Z87891 Personal history of nicotine dependence: Secondary | ICD-10-CM

## 2015-05-14 DIAGNOSIS — F0390 Unspecified dementia without behavioral disturbance: Secondary | ICD-10-CM | POA: Diagnosis present

## 2015-05-14 DIAGNOSIS — I96 Gangrene, not elsewhere classified: Secondary | ICD-10-CM | POA: Diagnosis not present

## 2015-05-14 DIAGNOSIS — A419 Sepsis, unspecified organism: Secondary | ICD-10-CM | POA: Diagnosis not present

## 2015-05-14 DIAGNOSIS — E46 Unspecified protein-calorie malnutrition: Secondary | ICD-10-CM | POA: Diagnosis not present

## 2015-05-14 DIAGNOSIS — Z96641 Presence of right artificial hip joint: Secondary | ICD-10-CM | POA: Diagnosis present

## 2015-05-14 DIAGNOSIS — E440 Moderate protein-calorie malnutrition: Secondary | ICD-10-CM | POA: Diagnosis present

## 2015-05-14 DIAGNOSIS — E43 Unspecified severe protein-calorie malnutrition: Secondary | ICD-10-CM | POA: Diagnosis not present

## 2015-05-14 DIAGNOSIS — Z96649 Presence of unspecified artificial hip joint: Secondary | ICD-10-CM | POA: Diagnosis not present

## 2015-05-14 DIAGNOSIS — Z7189 Other specified counseling: Secondary | ICD-10-CM | POA: Diagnosis not present

## 2015-05-14 DIAGNOSIS — I10 Essential (primary) hypertension: Secondary | ICD-10-CM

## 2015-05-14 DIAGNOSIS — Z7401 Bed confinement status: Secondary | ICD-10-CM | POA: Diagnosis not present

## 2015-05-14 DIAGNOSIS — T360X5A Adverse effect of penicillins, initial encounter: Secondary | ICD-10-CM | POA: Diagnosis not present

## 2015-05-14 DIAGNOSIS — Z6821 Body mass index (BMI) 21.0-21.9, adult: Secondary | ICD-10-CM | POA: Diagnosis not present

## 2015-05-14 DIAGNOSIS — R279 Unspecified lack of coordination: Secondary | ICD-10-CM | POA: Diagnosis not present

## 2015-05-14 DIAGNOSIS — K219 Gastro-esophageal reflux disease without esophagitis: Secondary | ICD-10-CM | POA: Diagnosis not present

## 2015-05-14 DIAGNOSIS — M1990 Unspecified osteoarthritis, unspecified site: Secondary | ICD-10-CM | POA: Diagnosis not present

## 2015-05-14 DIAGNOSIS — L089 Local infection of the skin and subcutaneous tissue, unspecified: Secondary | ICD-10-CM | POA: Diagnosis not present

## 2015-05-14 DIAGNOSIS — Z515 Encounter for palliative care: Secondary | ICD-10-CM | POA: Diagnosis not present

## 2015-05-14 DIAGNOSIS — M6281 Muscle weakness (generalized): Secondary | ICD-10-CM | POA: Diagnosis not present

## 2015-05-14 DIAGNOSIS — T148 Other injury of unspecified body region: Secondary | ICD-10-CM | POA: Diagnosis not present

## 2015-05-14 DIAGNOSIS — F419 Anxiety disorder, unspecified: Secondary | ICD-10-CM | POA: Diagnosis present

## 2015-05-14 DIAGNOSIS — T148XXA Other injury of unspecified body region, initial encounter: Secondary | ICD-10-CM

## 2015-05-14 DIAGNOSIS — Z79899 Other long term (current) drug therapy: Secondary | ICD-10-CM | POA: Diagnosis not present

## 2015-05-14 DIAGNOSIS — L270 Generalized skin eruption due to drugs and medicaments taken internally: Secondary | ICD-10-CM | POA: Diagnosis not present

## 2015-05-14 DIAGNOSIS — L98499 Non-pressure chronic ulcer of skin of other sites with unspecified severity: Secondary | ICD-10-CM | POA: Diagnosis present

## 2015-05-14 DIAGNOSIS — Z66 Do not resuscitate: Secondary | ICD-10-CM | POA: Diagnosis not present

## 2015-05-14 DIAGNOSIS — A4901 Methicillin susceptible Staphylococcus aureus infection, unspecified site: Secondary | ICD-10-CM

## 2015-05-14 DIAGNOSIS — R278 Other lack of coordination: Secondary | ICD-10-CM | POA: Diagnosis not present

## 2015-05-14 LAB — CBC WITH DIFFERENTIAL/PLATELET
BASOS ABS: 0 10*3/uL (ref 0.0–0.1)
BASOS PCT: 0 %
EOS ABS: 1 10*3/uL — AB (ref 0.0–0.7)
Eosinophils Relative: 4 %
HCT: 41.2 % (ref 36.0–46.0)
HEMOGLOBIN: 13.3 g/dL (ref 12.0–15.0)
LYMPHS ABS: 1.8 10*3/uL (ref 0.7–4.0)
LYMPHS PCT: 7 %
MCH: 27.7 pg (ref 26.0–34.0)
MCHC: 32.3 g/dL (ref 30.0–36.0)
MCV: 85.7 fL (ref 78.0–100.0)
Monocytes Absolute: 1.5 10*3/uL — ABNORMAL HIGH (ref 0.1–1.0)
Monocytes Relative: 6 %
NEUTROS ABS: 21.3 10*3/uL — AB (ref 1.7–7.7)
Neutrophils Relative %: 83 %
Platelets: 386 10*3/uL (ref 150–400)
RBC: 4.81 MIL/uL (ref 3.87–5.11)
RDW: 16.3 % — AB (ref 11.5–15.5)
WBC: 25.6 10*3/uL — ABNORMAL HIGH (ref 4.0–10.5)

## 2015-05-14 LAB — COMPREHENSIVE METABOLIC PANEL
ALBUMIN: 2.7 g/dL — AB (ref 3.5–5.0)
ALK PHOS: 66 U/L (ref 38–126)
ALT: 15 U/L (ref 14–54)
AST: 22 U/L (ref 15–41)
Anion gap: 16 — ABNORMAL HIGH (ref 5–15)
BILIRUBIN TOTAL: 0.4 mg/dL (ref 0.3–1.2)
BUN: 19 mg/dL (ref 6–20)
CALCIUM: 9.6 mg/dL (ref 8.9–10.3)
CO2: 23 mmol/L (ref 22–32)
CREATININE: 0.57 mg/dL (ref 0.44–1.00)
Chloride: 106 mmol/L (ref 101–111)
GFR calc Af Amer: >60 mL/min (ref >60)
GFR calc non Af Amer: >60 mL/min (ref >60)
GLUCOSE: 92 mg/dL (ref 65–99)
Potassium: 3.7 mmol/L (ref 3.5–5.1)
SODIUM: 145 mmol/L (ref 135–145)
TOTAL PROTEIN: 6.9 g/dL (ref 6.5–8.1)

## 2015-05-14 LAB — URINALYSIS, ROUTINE W REFLEX MICROSCOPIC
BILIRUBIN URINE: NEGATIVE
GLUCOSE, UA: NEGATIVE mg/dL
HGB URINE DIPSTICK: NEGATIVE
Ketones, ur: NEGATIVE mg/dL
Nitrite: POSITIVE — AB
PROTEIN: NEGATIVE mg/dL
SPECIFIC GRAVITY, URINE: 1.018 (ref 1.005–1.030)
pH: 5.5 (ref 5.0–8.0)

## 2015-05-14 LAB — URINE MICROSCOPIC-ADD ON: RBC / HPF: NONE SEEN RBC/hpf (ref 0–5)

## 2015-05-14 LAB — I-STAT CG4 LACTIC ACID, ED: LACTIC ACID, VENOUS: 2.71 mmol/L — AB (ref 0.5–2.0)

## 2015-05-14 MED ORDER — SODIUM CHLORIDE 0.9 % IV SOLN
1.5000 g | Freq: Four times a day (QID) | INTRAVENOUS | Status: DC
  Administered 2015-05-14: 1.5 g via INTRAVENOUS
  Filled 2015-05-14 (×2): qty 1.5

## 2015-05-14 MED ORDER — METOPROLOL SUCCINATE ER 25 MG PO TB24
25.0000 mg | ORAL_TABLET | Freq: Every day | ORAL | Status: DC
  Administered 2015-05-15 – 2015-05-20 (×6): 25 mg via ORAL
  Filled 2015-05-14 (×6): qty 1

## 2015-05-14 MED ORDER — VITAMIN C 500 MG PO TABS
500.0000 mg | ORAL_TABLET | Freq: Two times a day (BID) | ORAL | Status: DC
  Administered 2015-05-15 – 2015-05-20 (×11): 500 mg via ORAL
  Filled 2015-05-14 (×11): qty 1

## 2015-05-14 MED ORDER — VITAMIN B-12 1000 MCG PO TABS
500.0000 ug | ORAL_TABLET | Freq: Every day | ORAL | Status: DC
  Administered 2015-05-16 – 2015-05-20 (×5): 500 ug via ORAL
  Filled 2015-05-14 (×11): qty 1

## 2015-05-14 MED ORDER — BOOST / RESOURCE BREEZE PO LIQD
1.0000 | Freq: Three times a day (TID) | ORAL | Status: DC
Start: 2015-05-15 — End: 2015-05-20
  Administered 2015-05-15 – 2015-05-19 (×12): 1 via ORAL

## 2015-05-14 MED ORDER — MEGESTROL ACETATE 40 MG/ML PO SUSP
200.0000 mg | Freq: Two times a day (BID) | ORAL | Status: DC
  Administered 2015-05-15 – 2015-05-20 (×10): 200 mg via ORAL
  Filled 2015-05-14 (×14): qty 5

## 2015-05-14 MED ORDER — AMLODIPINE BESYLATE 5 MG PO TABS
5.0000 mg | ORAL_TABLET | Freq: Every day | ORAL | Status: DC
  Administered 2015-05-16 – 2015-05-20 (×5): 5 mg via ORAL
  Filled 2015-05-14 (×5): qty 1

## 2015-05-14 MED ORDER — VANCOMYCIN HCL IN DEXTROSE 1-5 GM/200ML-% IV SOLN
1000.0000 mg | Freq: Once | INTRAVENOUS | Status: AC
  Administered 2015-05-14: 1000 mg via INTRAVENOUS
  Filled 2015-05-14: qty 200

## 2015-05-14 MED ORDER — PIPERACILLIN-TAZOBACTAM 3.375 G IVPB
3.3750 g | Freq: Three times a day (TID) | INTRAVENOUS | Status: DC
  Administered 2015-05-15 – 2015-05-19 (×13): 3.375 g via INTRAVENOUS
  Filled 2015-05-14 (×15): qty 50

## 2015-05-14 MED ORDER — SODIUM CHLORIDE 0.9 % IV SOLN
INTRAVENOUS | Status: AC
  Administered 2015-05-15 (×2): via INTRAVENOUS

## 2015-05-14 MED ORDER — PRO-STAT SUGAR FREE PO LIQD
30.0000 mL | Freq: Three times a day (TID) | ORAL | Status: DC
  Administered 2015-05-15 – 2015-05-20 (×15): 30 mL via ORAL
  Filled 2015-05-14 (×13): qty 30

## 2015-05-14 MED ORDER — VANCOMYCIN HCL IN DEXTROSE 1-5 GM/200ML-% IV SOLN
1000.0000 mg | INTRAVENOUS | Status: DC
  Administered 2015-05-15 – 2015-05-18 (×4): 1000 mg via INTRAVENOUS
  Filled 2015-05-14 (×5): qty 200

## 2015-05-14 MED ORDER — SODIUM CHLORIDE 0.9 % IV BOLUS (SEPSIS)
30.0000 mL/kg | Freq: Once | INTRAVENOUS | Status: AC
  Administered 2015-05-14: 1632 mL via INTRAVENOUS

## 2015-05-14 MED ORDER — ADULT MULTIVITAMIN W/MINERALS CH
1.0000 | ORAL_TABLET | Freq: Every day | ORAL | Status: DC
Start: 2015-05-15 — End: 2015-05-20
  Administered 2015-05-16 – 2015-05-20 (×5): 1 via ORAL
  Filled 2015-05-14 (×5): qty 1

## 2015-05-14 NOTE — Progress Notes (Signed)
Pre visit review using our clinic review tool, if applicable. No additional management support is needed unless otherwise documented below in the visit note. 

## 2015-05-14 NOTE — ED Notes (Signed)
Was unable to collect 2nd set of cultures

## 2015-05-14 NOTE — Patient Instructions (Signed)
STOP the Hydroxyzine  Let me know if interested in pursuing Palliative Care or Hospice consult.

## 2015-05-14 NOTE — Progress Notes (Signed)
Subjective:    Patient ID: [name] Frank, female    DOB: 1935/02/01, 81 y.o.   MRN: [id number]  HPI Patient seen for medical follow-up, they by her husband and sister-in-law. She is currently residing in a nursing facility-but not skilled nursing. She has multiple chronic problems including dementia, hypertension, protein calorie malnutrition, stage IV sacral decubitus ulcer with history of osteomyelitis, hyperlipidemia, hypertension.  She's had gradual decline over many months.  She had recent admission every 14 through February 17 for wound infection. Urine and blood cultures were negative. Patient treated empirically with vancomycin. MRI revealed no evidence for osteomyelitis or soft tissue abscess. Patient is followed closely by surgery for wound care. She is apparently refused physical therapy or skilled nursing.  Last visit here she was tachycardic and had significantly elevated white count. We did advise admission and she was not admitted until several days later.  Family's major concern now is that she appears to be oversedated. She had some dry skin dermatitis and this eventually improved with triamcinolone combined with moisturizer. She apparently is still getting hydroxyzine which may be causing some of her daytime sedation. She should not be getting any other sedating medications at this time.  According to family she is eating fairly well. No reported fever.  Past Medical History  Diagnosis Date  . Allergy   . Hyperlipidemia   . Hypertension   . Anxiety   . GERD (gastroesophageal reflux disease)   . Urinary frequency   . Neurogenic urinary incontinence     since back surgery  . Arthritis     degenerative oateoarthritis  . Decubital ulcer 11/14/2014  . Dementia    Past Surgical History  Procedure Laterality Date  . Appendectomy    . Cholecystectomy    . Abdominal hysterectomy    . Back surgery  2000  . Colonoscopy w/ polypectomy    . Breast surgery  1993   biopsy  . Total hip arthroplasty  01/07/2012    Procedure: TOTAL HIP ARTHROPLASTY;  Surgeon: Yvette Rack., MD;  Location: [place];  Service: Orthopedics;  Laterality: Right;  . Bone marrow biopsy N/A 11/22/2014    Procedure: SACRAL BONE BIOPSY PRONE POSITION;  Surgeon: Georganna Skeans, MD;  Location: [place];  Service: General;  Laterality: N/A;  . Ileo loop colostomy closure N/A 11/22/2014    Procedure: LAPAROSCOPIC LOOP COLOSTOMY ;  Surgeon: Georganna Skeans, MD;  Location: [place];  Service: General;  Laterality: N/A;    reports that she quit smoking about 31 years ago. Her smoking use included Cigarettes. She has a 5 pack-year smoking history. She has never used smokeless tobacco. She reports that she does not drink alcohol or use illicit drugs. family history includes Diabetes in her mother. Allergies  Allergen Reactions  . Augmentin [Amoxicillin-Pot Clavulanate]     Per MAR  . Codeine Sulfate Other (See Comments)    stomach pains  . Tramadol Hcl Other (See Comments)    stomach cramps      Review of Systems  Constitutional: Negative for fever and chills.  Respiratory: Negative for cough and shortness of breath.   Cardiovascular: Negative for chest pain.  Gastrointestinal: Negative for abdominal pain.  Psychiatric/Behavioral: Negative for agitation.       Objective:   Physical Exam  Constitutional:  Patient is very sedated today but is arousable.  HENT:  Mouth/Throat: Oropharynx is clear and moist.  Neck: Neck supple.  Cardiovascular: Normal rate and regular rhythm.   Pulmonary/Chest:  Effort normal and breath sounds normal. No respiratory distress. She has no wheezes. She has no rales.  Musculoskeletal:  Trace edema lower legs bilaterally          Assessment & Plan:  #1 stage IV decubitus ulcer with history of osteomyelitis. Currently afebrile. She will continue close follow-up with surgery  #2 sedation. Possibly related hydroxyzine. Family cannot confirm if she has  actually gotten any recent hydroxyzine from her facility. We put a firm order to discontinue this. They will also try to bring a list of her medication she is getting at the facility  #3 hypertension. Stable and at goal. No recent reported low blood pressure. She is not ambulating.  #4 advanced directives. Had a long discussion with husband and sister-in-law. We have strongly advise consideration for palliative care versus hospice consult. Husband would not commit at this point. I think she is a candidate for hospice consideration this point and he will give this some consideration. Her prognosis is very poor this point especially she is not participating in physical therapy

## 2015-05-14 NOTE — Progress Notes (Signed)
Pharmacy Antibiotic Note  [name] Frank is a 81 y.o. female admitted on 05/14/2015 with wound infection.  Started on Unasyn but decided to switch to Zosyn and vancomycin. Pharmacy has been consulted for Zosyn and vancomycin dosing. Afebrile, WBC elevated. SCr stable, CrCl ~36ml/min.  Plan: Stop Unasyn Start Zosyn 3.375 gm IV q8h (4 hour infusion) Start vancomycin 1g IV Q24 Monitor clinical picture, renal function, VT prn F/U C&S, abx deescalation / LOT   Height:  (170.2 cm) Weight: 120 lb (54.432 kg) IBW/kg (Calculated) : 61.6  Temp (24hrs), Avg:98.4 F (36.9 C), Min:98.1 F (36.7 C), Max:98.6 F (37 C)   Recent Labs Lab 05/14/15 1941 05/14/15 1955  WBC 25.6*  --   CREATININE 0.57  --   LATICACIDVEN  --  2.71*    Estimated Creatinine Clearance: 49.8 mL/min (by C-G formula based on Cr of 0.57).    Allergies  Allergen Reactions  . Augmentin [Amoxicillin-Pot Clavulanate] Other (See Comments)    Per MAR  . Codeine Sulfate Other (See Comments)    stomach pains  . Tramadol Hcl Other (See Comments)    stomach cramps    Thank you for allowing pharmacy to be a part of this patient's care.  Armandina Stammer 05/14/2015 10:47 PM

## 2015-05-14 NOTE — ED Notes (Signed)
Pt in EMS from [facility], pt was referred by [facility] to come for Stage 4 sacral decubitus. Pt has hx dementia. Tachycardic.

## 2015-05-14 NOTE — Progress Notes (Signed)
Pharmacy Antibiotic Note  [name] Frank is a 81 y.o. female admitted on 05/14/2015 with wound infection.  Pharmacy has been consulted for Unasyn dosing. CrCl ~56mL/min, WBC 25.6, Tmax 98.6.   Plan: Unasyn 1.5g IV Q6h  F/U c/s, renal fxn, LOT  Height:  (170.2 cm) Weight: 120 lb (54.432 kg) IBW/kg (Calculated) : 61.6  Temp (24hrs), Avg:98.4 F (36.9 C), Min:98.1 F (36.7 C), Max:98.6 F (37 C)   Recent Labs Lab 05/14/15 1941 05/14/15 1955  WBC 25.6*  --   CREATININE 0.57  --   LATICACIDVEN  --  2.71*    Estimated Creatinine Clearance: 49.8 mL/min (by C-G formula based on Cr of 0.57).    Allergies  Allergen Reactions  . Augmentin [Amoxicillin-Pot Clavulanate]     Per MAR  . Codeine Sulfate Other (See Comments)    stomach pains  . Tramadol Hcl Other (See Comments)    stomach cramps    Antimicrobials this admission: 3/1 Unasyn>>   Thank you for allowing pharmacy to be a part of this patient's care.  Cobie Marcoux C. Marvis Moeller, PharmD Pharmacy Resident  Pager: [phone] 05/14/2015 9:08 PM

## 2015-05-14 NOTE — ED Notes (Signed)
EDP aware of lactic  

## 2015-05-14 NOTE — H&P (Signed)
History and Physical  [name] Frank HQI:[id number] DOB: Feb 18, 1935 DOA: 05/14/2015  PCP: Eulas Post, MD   Chief Complaint: wound infection   History of Present Illness:  Patient is a 81 yo female with history of dementia, HTN, stage IV decub ulcer who was admitted recently for wound infection and treated empirically with vancomycin then sent to NH with wound care, not clear that she was discharge with antibiotics. She has dementia and she can't communicate any complaints today. Son at bedside and he is not aware that she has had any complaints or fever/chills. He said she has been following up with PCP/wound care specialist who were not content with wound care she has been getting as her decub ulcer has been looking worse for the past couple of days. Today when she was checked by her PCP, he sent her to her wound care specialist who decided to admit her.   Review of Systems:  Unable to get detailed ROS as patient is not communicating well ( baseline). She denied pain / fever.   Past Medical and Surgical History:   Past Medical History  Diagnosis Date  . Allergy   . Hyperlipidemia   . Hypertension   . Anxiety   . GERD (gastroesophageal reflux disease)   . Urinary frequency   . Neurogenic urinary incontinence     since back surgery  . Arthritis     degenerative oateoarthritis  . Decubital ulcer 11/14/2014  . Dementia    Past Surgical History  Procedure Laterality Date  . Appendectomy    . Cholecystectomy    . Abdominal hysterectomy    . Back surgery  2000  . Colonoscopy w/ polypectomy    . Breast surgery  1993    biopsy  . Total hip arthroplasty  01/07/2012    Procedure: TOTAL HIP ARTHROPLASTY;  Surgeon: Yvette Rack., MD;  Location: [place];  Service: Orthopedics;  Laterality: Right;  . Bone marrow biopsy N/A 11/22/2014    Procedure: SACRAL BONE BIOPSY PRONE POSITION;  Surgeon: Georganna Skeans, MD;  Location: [place];  Service: General;  Laterality: N/A;  .  Ileo loop colostomy closure N/A 11/22/2014    Procedure: LAPAROSCOPIC LOOP COLOSTOMY ;  Surgeon: Georganna Skeans, MD;  Location: [place];  Service: General;  Laterality: N/A;    Social History:   reports that she quit smoking about 31 years ago. Her smoking use included Cigarettes. She has a 5 pack-year smoking history. She has never used smokeless tobacco. She reports that she does not drink alcohol or use illicit drugs.   Allergies  Allergen Reactions  . Augmentin [Amoxicillin-Pot Clavulanate] Other (See Comments)    Per MAR  . Codeine Sulfate Other (See Comments)    stomach pains  . Tramadol Hcl Other (See Comments)    stomach cramps    Family History  Problem Relation Age of Onset  . Diabetes Mother   . Alcohol abuse      fhx  . Arthritis      fhx  . Hypertension      fhx  . Heart disease      fhx  . Cancer      colon, lung, prostate/fhx  . Stroke      fhx  . Depression      fhx      Prior to Admission medications   Medication Sig Start Date End Date Taking? Authorizing Provider  Amino Acids-Protein Hydrolys (FEEDING SUPPLEMENT, PRO-STAT SUGAR FREE 64,) LIQD Take 30 mLs  by mouth 3 (three) times daily with meals.    Historical Provider, MD  amLODipine (NORVASC) 5 MG tablet Take 5 mg by mouth daily.    Historical Provider, MD  collagenase (SANTYL) ointment Apply topically daily. 05/02/15   Donne Hazel, MD  donepezil (ARICEPT) 10 MG tablet Take 1 tablet (10 mg total) by mouth at bedtime. 07/19/14   Eulas Post, MD  feeding supplement (BOOST / RESOURCE BREEZE) LIQD Take 1 Container by mouth 3 (three) times daily between meals. 11/26/14   Janece Canterbury, MD  furosemide (LASIX) 20 MG tablet TAKE 1 TABLET (20 MG TOTAL) BY MOUTH DAILY. 10/02/14   Eulas Post, MD  losartan (COZAAR) 50 MG tablet TAKE 1 TABLET BY MOUTH DAILY Patient taking differently: TAKE 50 MG BY MOUTH DAILY 01/14/14   Eulas Post, MD  megestrol (MEGACE ORAL) 40 MG/ML suspension One tsp po  bid Patient taking differently: Take 200 mg by mouth 2 (two) times daily.  06/19/14   Eulas Post, MD  metoprolol succinate (TOPROL-XL) 25 MG 24 hr tablet Take 1 tablet (25 mg total) by mouth daily. 04/14/15   Eulas Post, MD  Multiple Vitamins-Minerals (CERTAGEN) tablet Take 1 tablet by mouth daily.    Historical Provider, MD  vitamin B-12 (CYANOCOBALAMIN) 500 MCG tablet Take 500 mcg by mouth daily.    Historical Provider, MD  vitamin C (VITAMIN C) 500 MG tablet Take 1 tablet (500 mg total) by mouth 2 (two) times daily. 11/26/14   Janece Canterbury, MD    Physical Exam: BP 136/74 mmHg  Pulse 99  Temp(Src) 98.6 F (37 C) (Oral)  Resp 16  Ht '5\' 7"'$  (1.702 m)  Wt 54.432 kg (120 lb)  BMI 18.79 kg/m2  SpO2 100%  GENERAL :  appears to be in no acute distress. HEAD: normocephalic. EYES: PERRL, EOMI.  THROAT: Oral cavity and pharynx normal. NECK: Neck supple CARDIAC: Normal S1 and S2. No S3, S4 or murmurs. Rhythm is regular. There is no peripheral edema. LUNGS: Clear to auscultation  ABDOMEN: Positive bowel sounds. Soft, nondistended, nontender.  NEUROLOGICAL: The mental examination revealed the patient was not oriented to person, place, and time. SKIN: diffuse erythematous rash ( for months per son). Sacral decub ulcer stage IV .          Labs on Admission:  Reviewed.   Radiological Exams on Admission: No results found.   Assessment/Plan  Wound infection: decub ulcer, stage IV, purulent discharge.  Started on vanc/zosyn Surgery consult in am for debridement  Recent MRI w/o OM Wound care consult in am Got 1L NS bolus in am. Hold lasix  HTN: cont meds with holding parameters for SBP < 100  Dementia: need assistance for feeding. She had a colostomy due to decub ulcer.      DVT prophylaxis: [facility] enoxaparin  Consultants: surgery/wound care Code Status: Full     Gennaro Africa M.D [facility]

## 2015-05-14 NOTE — ED Provider Notes (Signed)
CSN: [id number]     Arrival date & time 05/14/15  1903 History   First MD Initiated Contact with Patient 05/14/15 1904     Chief Complaint  Patient presents with  . Skin Ulcer     (Consider location/radiation/quality/duration/timing/severity/associated sxs/prior Treatment) HPI Patient was seen by wound care physician at the wound care center. Her wound was determined to be necrotic and in need of  Debridement and admission and IV antibiotics. Patient has dementia she does not give additional history.   Past Medical History  Diagnosis Date  . Allergy   . Hyperlipidemia   . Hypertension   . Anxiety   . GERD (gastroesophageal reflux disease)   . Urinary frequency   . Neurogenic urinary incontinence     since back surgery  . Arthritis     degenerative oateoarthritis  . Decubital ulcer 11/14/2014  . Dementia    Past Surgical History  Procedure Laterality Date  . Appendectomy    . Cholecystectomy    . Abdominal hysterectomy    . Back surgery  2000  . Colonoscopy w/ polypectomy    . Breast surgery  1993    biopsy  . Total hip arthroplasty  01/07/2012    Procedure: TOTAL HIP ARTHROPLASTY;  Surgeon: Yvette Rack., MD;  Location: [place];  Service: Orthopedics;  Laterality: Right;  . Bone marrow biopsy N/A 11/22/2014    Procedure: SACRAL BONE BIOPSY PRONE POSITION;  Surgeon: Georganna Skeans, MD;  Location: [place];  Service: General;  Laterality: N/A;  . Ileo loop colostomy closure N/A 11/22/2014    Procedure: LAPAROSCOPIC LOOP COLOSTOMY ;  Surgeon: Georganna Skeans, MD;  Location: [facility];  Service: General;  Laterality: N/A;   Family History  Problem Relation Age of Onset  . Diabetes Mother   . Alcohol abuse      fhx  . Arthritis      fhx  . Hypertension      fhx  . Heart disease      fhx  . Cancer      colon, lung, prostate/fhx  . Stroke      fhx  . Depression      fhx   Social History  Substance Use Topics  . Smoking status: Former Smoker -- 0.50 packs/day for 10 years    Types: Cigarettes    Quit date: 09/23/1983  . Smokeless tobacco: Never Used  . Alcohol Use: No   OB History    No data available     Review of Systems  Patient cannot give ROS level V dementia  Allergies  Augmentin; Codeine sulfate; and Tramadol hcl  Home Medications   Prior to Admission medications   Medication Sig Start Date End Date Taking? Authorizing Provider  Amino Acids-Protein Hydrolys (FEEDING SUPPLEMENT, PRO-STAT SUGAR FREE 64,) LIQD Take 30 mLs by mouth 3 (three) times daily with meals.   Yes Historical Provider, MD  amLODipine (NORVASC) 5 MG tablet Take 5 mg by mouth daily.   Yes Historical Provider, MD  donepezil (ARICEPT) 10 MG tablet Take 1 tablet (10 mg total) by mouth at bedtime. 07/19/14  Yes Eulas Post, MD  feeding supplement (BOOST / RESOURCE BREEZE) LIQD Take 1 Container by mouth 3 (three) times daily between meals. 11/26/14  Yes Janece Canterbury, MD  furosemide (LASIX) 20 MG tablet TAKE 1 TABLET (20 MG TOTAL) BY MOUTH DAILY. 10/02/14  Yes Eulas Post, MD  losartan (COZAAR) 50 MG tablet TAKE 1 TABLET BY MOUTH DAILY Patient  taking differently: TAKE 50 MG BY MOUTH DAILY 01/14/14  Yes Kristian Covey, MD  megestrol (MEGACE ORAL) 40 MG/ML suspension One tsp po bid Patient taking differently: Take 200 mg by mouth 2 (two) times daily.  06/19/14  Yes Kristian Covey, MD  metoprolol succinate (TOPROL-XL) 25 MG 24 hr tablet Take 1 tablet (25 mg total) by mouth daily. 04/14/15  Yes Kristian Covey, MD  Multiple Vitamins-Minerals (CERTAGEN) tablet Take 1 tablet by mouth daily.   Yes Historical Provider, MD  vitamin B-12 (CYANOCOBALAMIN) 500 MCG tablet Take 500 mcg by mouth daily.   Yes Historical Provider, MD  vitamin C (VITAMIN C) 500 MG tablet Take 1 tablet (500 mg total) by mouth 2 (two) times daily. 11/26/14  Yes Renae Fickle, MD   BP 141/74 mmHg  Pulse 97  Temp(Src) 98.4 F (36.9 C) (Oral)  Resp 19  Ht 5\' 7"  (1.702 m)  Wt 137 lb 9.1 oz (62.4 kg)   BMI 21.54 kg/m2  SpO2 99% Physical Exam  Constitutional:  Patient is alert and nontoxic. She is not interactive with verbal communication. No respiratory distress.  HENT:  Head: Normocephalic and atraumatic.  Cardiovascular: Normal rate, regular rhythm, normal heart sounds and intact distal pulses.   Pulmonary/Chest: Effort normal and breath sounds normal. No respiratory distress. She has no wheezes.  Abdominal: Soft. She exhibits no distension. There is no tenderness.  Neurological: She is alert.        ED Course  Procedures (including critical care time) Labs Review Labs Reviewed  COMPREHENSIVE METABOLIC PANEL - Abnormal; Notable for the following:    Albumin 2.7 (*)    Anion gap 16 (*)    All other components within normal limits  CBC WITH DIFFERENTIAL/PLATELET - Abnormal; Notable for the following:    WBC 25.6 (*)    RDW 16.3 (*)    Neutro Abs 21.3 (*)    Monocytes Absolute 1.5 (*)    Eosinophils Absolute 1.0 (*)    All other components within normal limits  URINALYSIS, ROUTINE W REFLEX MICROSCOPIC (NOT AT [facility]) - Abnormal; Notable for the following:    Color, Urine AMBER (*)    APPearance CLOUDY (*)    Nitrite POSITIVE (*)    Leukocytes, UA MODERATE (*)    All other components within normal limits  URINE MICROSCOPIC-ADD ON - Abnormal; Notable for the following:    Squamous Epithelial / LPF 0-5 (*)    Bacteria, UA RARE (*)    Crystals CA OXALATE CRYSTALS (*)    All other components within normal limits  CBC - Abnormal; Notable for the following:    WBC 14.9 (*)    RDW 16.0 (*)    All other components within normal limits  BASIC METABOLIC PANEL - Abnormal; Notable for the following:    CO2 21 (*)    Glucose, Bld 148 (*)    Calcium 8.7 (*)    All other components within normal limits  CBC - Abnormal; Notable for the following:    WBC 16.6 (*)    Hemoglobin 11.7 (*)    HCT 35.7 (*)    RDW 16.2 (*)    All other components within normal limits  BASIC METABOLIC  PANEL - Abnormal; Notable for the following:    Potassium 3.0 (*)    Glucose, Bld 153 (*)    Calcium 8.4 (*)    All other components within normal limits  I-STAT CG4 LACTIC ACID, ED - Abnormal; Notable for the following:  Lactic Acid, Venous 2.71 (*)    All other components within normal limits  CULTURE, BLOOD (ROUTINE X 2)    Imaging Review No results found. I have personally reviewed and evaluated these images and lab results as part of my medical decision-making.   EKG Interpretation None     Consult hospitalist for admission MDM   Final diagnoses:  Sacral decubitus ulcer, stage IV ([place])  Wound infection ([place])   Patient is alert. She has advanced dementia and is nonverbal. Family members give additional history. Patient was referred for severe decubitus ulcer as illustrated in the photograph. IV antibiotics initiated based on EMR reviewed and patient is admitted.    Charlesetta Shanks, MD 05/18/15 0730

## 2015-05-15 ENCOUNTER — Inpatient Hospital Stay (HOSPITAL_COMMUNITY): Payer: Medicare Other

## 2015-05-15 DIAGNOSIS — I10 Essential (primary) hypertension: Secondary | ICD-10-CM

## 2015-05-15 DIAGNOSIS — A419 Sepsis, unspecified organism: Principal | ICD-10-CM

## 2015-05-15 MED ORDER — DIPHENHYDRAMINE HCL 50 MG/ML IJ SOLN
INTRAMUSCULAR | Status: AC
  Filled 2015-05-15: qty 1

## 2015-05-15 MED ORDER — ENOXAPARIN SODIUM 40 MG/0.4ML ~~LOC~~ SOLN
40.0000 mg | SUBCUTANEOUS | Status: DC
  Administered 2015-05-15 – 2015-05-19 (×5): 40 mg via SUBCUTANEOUS
  Filled 2015-05-15 (×5): qty 0.4

## 2015-05-15 MED ORDER — DIPHENHYDRAMINE HCL 50 MG/ML IJ SOLN
25.0000 mg | Freq: Once | INTRAMUSCULAR | Status: AC
  Administered 2015-05-15: 25 mg via INTRAVENOUS

## 2015-05-15 MED ORDER — DONEPEZIL HCL 10 MG PO TABS
10.0000 mg | ORAL_TABLET | Freq: Every day | ORAL | Status: DC
  Administered 2015-05-15 – 2015-05-19 (×5): 10 mg via ORAL
  Filled 2015-05-15 (×5): qty 1

## 2015-05-15 MED ORDER — METHYLPREDNISOLONE SODIUM SUCC 125 MG IJ SOLR
INTRAMUSCULAR | Status: AC
  Filled 2015-05-15: qty 2

## 2015-05-15 MED ORDER — COLLAGENASE 250 UNIT/GM EX OINT
TOPICAL_OINTMENT | Freq: Every day | CUTANEOUS | Status: DC
Start: 2015-05-15 — End: 2015-05-20
  Administered 2015-05-16 – 2015-05-20 (×5): via TOPICAL
  Filled 2015-05-15: qty 30

## 2015-05-15 MED ORDER — METHYLPREDNISOLONE SODIUM SUCC 125 MG IJ SOLR
80.0000 mg | Freq: Once | INTRAMUSCULAR | Status: AC
  Administered 2015-05-15: 80 mg via INTRAVENOUS

## 2015-05-15 NOTE — Consult Note (Signed)
Reason for Consult:  Sacral decubitus  Referring Physician:   S. Amye [name] Frank is an 81 y.o. female.  HPI: [name] Frank is a 81 year old female with a history of dementia, stage IV sacral decubitus ulcer with bone biopsy, debridement and diverting colostomy  debrided by Dr. Grandville Silos in September 2016. She has a diagnosis of osteomyelitis and was treated with IV drugs.  She is followed at the [facility].  The last visit I see with Dr. Johnnye [name] Frank was 01/06/15. Her last hospitalization here was 04/30/15.  The wound was described as: 4X3X.8cm with undermining to wound edges .5cm, bone visible. About 80% clean, slough noted.She had bridges removed from her colostomy, but there was no role for further debridement, and she was treated with hydrotherapy and local wound care. She was readmitted last PM with purulent discharge from her stage IV wound.  Hx of MRSA blood culture 11/27/14.  Sacral bone biopsy 11/22/14 Positive for MRSA.   Work up last PM: afebrile, HR up some, BP is stable. Lactate is up some, WBC is 25.6, stable H/H, Blood cultures drawn, CXR no acute changes.   We are ask to see. Her husband is here with her.  He says she was walking with walker at Christmas, but has not been walking at least since her last admission.       Past Medical History  Diagnosis Date  . Allergy   . Hyperlipidemia   . Hypertension   . Anxiety   . GERD (gastroesophageal reflux disease)   . Urinary frequency   . Neurogenic urinary incontinence     since back surgery  . Arthritis     degenerative oateoarthritis  . Decubital ulcer 11/14/2014  . Dementia     Past Surgical History  Procedure Laterality Date  . Appendectomy    . Cholecystectomy    . Abdominal hysterectomy    . Back surgery  2000  . Colonoscopy w/ polypectomy    . Breast surgery  1993    biopsy  . Total hip arthroplasty  01/07/2012    Procedure: TOTAL HIP ARTHROPLASTY;  Surgeon: Yvette Rack., MD;   Location: [place];  Service: Orthopedics;  Laterality: Right;  . Bone marrow biopsy N/A 11/22/2014    Procedure: SACRAL BONE BIOPSY PRONE POSITION;  Surgeon: Georganna Skeans, MD;  Location: [place];  Service: General;  Laterality: N/A;  . Ileo loop colostomy closure N/A 11/22/2014    Procedure: LAPAROSCOPIC LOOP COLOSTOMY ;  Surgeon: Georganna Skeans, MD;  Location: [facility];  Service: General;  Laterality: N/A;    Family History  Problem Relation Age of Onset  . Diabetes Mother   . Alcohol abuse      fhx  . Arthritis      fhx  . Hypertension      fhx  . Heart disease      fhx  . Cancer      colon, lung, prostate/fhx  . Stroke      fhx  . Depression      fhx    Social History:  reports that she quit smoking about 31 years ago. Her smoking use included Cigarettes. She has a 5 pack-year smoking history. She has never used smokeless tobacco. She reports that she does not drink alcohol or use illicit drugs.  Allergies:  Allergies  Allergen Reactions  . Augmentin [Amoxicillin-Pot Clavulanate] Other (See Comments)    Per MAR  . Codeine Sulfate Other (See Comments)  stomach pains  . Tramadol Hcl Other (See Comments)    stomach cramps    Medications:  Prior to Admission:  Prescriptions prior to admission  Medication Sig Dispense Refill Last Dose  . Amino Acids-Protein Hydrolys (FEEDING SUPPLEMENT, PRO-STAT SUGAR FREE 64,) LIQD Take 30 mLs by mouth 3 (three) times daily with meals.   Taking  . amLODipine (NORVASC) 5 MG tablet Take 5 mg by mouth daily.   Taking  . collagenase (SANTYL) ointment Apply topically daily. 15 g 0 Taking  . donepezil (ARICEPT) 10 MG tablet Take 1 tablet (10 mg total) by mouth at bedtime. 30 tablet 11 Taking  . feeding supplement (BOOST / RESOURCE BREEZE) LIQD Take 1 Container by mouth 3 (three) times daily between meals. Brilliant furosemide (LASIX) 20 MG tablet TAKE 1 TABLET (20 MG TOTAL) BY MOUTH DAILY. 30 tablet 5 Taking  . losartan (COZAAR) 50  MG tablet TAKE 1 TABLET BY MOUTH DAILY (Patient taking differently: TAKE 50 MG BY MOUTH DAILY) 90 tablet 2 Taking  . megestrol (MEGACE ORAL) 40 MG/ML suspension One tsp po bid (Patient taking differently: Take 200 mg by mouth 2 (two) times daily. ) 480 mL 1 Taking  . metoprolol succinate (TOPROL-XL) 25 MG 24 hr tablet Take 1 tablet (25 mg total) by mouth daily. 90 tablet 3 Taking  . Multiple Vitamins-Minerals (CERTAGEN) tablet Take 1 tablet by mouth daily.   Taking  . vitamin B-12 (CYANOCOBALAMIN) 500 MCG tablet Take 500 mcg by mouth daily.   Taking  . vitamin C (VITAMIN C) 500 MG tablet Take 1 tablet (500 mg total) by mouth 2 (two) times daily. 60 tablet 0 Taking   Scheduled: . [COMPLETED] sodium chloride   Intravenous STAT  . amLODipine  5 mg Oral Daily  . cyanocobalamin  500 mcg Oral Daily  . feeding supplement  1 Container Oral TID BM  . feeding supplement (PRO-STAT SUGAR FREE 64)  30 mL Oral TID WC  . megestrol  200 mg Oral BID  . metoprolol succinate  25 mg Oral Daily  . multivitamin with minerals  1 tablet Oral Daily  . piperacillin-tazobactam (ZOSYN)  IV  3.375 g Intravenous 3 times per day  . vancomycin  1,000 mg Intravenous Q24H  . vitamin C  500 mg Oral BID   Continuous:  PRN:  Results for orders placed or performed during the hospital encounter of 05/14/15 (from the past 48 hour(s))  Comprehensive metabolic panel     Status: Abnormal   Collection Time: 05/14/15  7:41 PM  Result Value Ref Range   Sodium 145 135 - 145 mmol/L   Potassium 3.7 3.5 - 5.1 mmol/L   Chloride 106 101 - 111 mmol/L   CO2 23 22 - 32 mmol/L   Glucose, Bld 92 65 - 99 mg/dL   BUN 19 6 - 20 mg/dL   Creatinine, Ser 0.57 0.44 - 1.00 mg/dL   Calcium 9.6 8.9 - 10.3 mg/dL   Total Protein 6.9 6.5 - 8.1 g/dL   Albumin 2.7 (L) 3.5 - 5.0 g/dL   AST 22 15 - 41 U/L   ALT 15 14 - 54 U/L   Alkaline Phosphatase 66 38 - 126 U/L   Total Bilirubin 0.4 0.3 - 1.2 mg/dL   GFR calc non Af Amer >60 >60 mL/min   GFR  calc Af Amer >60 >60 mL/min    Comment: (NOTE) The eGFR has been calculated using the CKD EPI equation. This calculation has  not been validated in all clinical situations. eGFR's persistently <60 mL/min signify possible Chronic Kidney Disease.    Anion gap 16 (H) 5 - 15  CBC with Differential     Status: Abnormal   Collection Time: 05/14/15  7:41 PM  Result Value Ref Range   WBC 25.6 (H) 4.0 - 10.5 K/uL   RBC 4.81 3.87 - 5.11 MIL/uL   Hemoglobin 13.3 12.0 - 15.0 g/dL   HCT 41.2 36.0 - 46.0 %   MCV 85.7 78.0 - 100.0 fL   MCH 27.7 26.0 - 34.0 pg   MCHC 32.3 30.0 - 36.0 g/dL   RDW 16.3 (H) 11.5 - 15.5 %   Platelets 386 150 - 400 K/uL   Neutrophils Relative % 83 %   Lymphocytes Relative 7 %   Monocytes Relative 6 %   Eosinophils Relative 4 %   Basophils Relative 0 %   Neutro Abs 21.3 (H) 1.7 - 7.7 K/uL   Lymphs Abs 1.8 0.7 - 4.0 K/uL   Monocytes Absolute 1.5 (H) 0.1 - 1.0 K/uL   Eosinophils Absolute 1.0 (H) 0.0 - 0.7 K/uL   Basophils Absolute 0.0 0.0 - 0.1 K/uL   RBC Morphology POLYCHROMASIA PRESENT     Comment: TARGET CELLS  I-Stat CG4 Lactic Acid, ED     Status: Abnormal   Collection Time: 05/14/15  7:55 PM  Result Value Ref Range   Lactic Acid, Venous 2.71 (HH) 0.5 - 2.0 mmol/L  Urinalysis, Routine w reflex microscopic     Status: Abnormal   Collection Time: 05/14/15  9:51 PM  Result Value Ref Range   Color, Urine AMBER (A) YELLOW    Comment: BIOCHEMICALS MAY BE AFFECTED BY COLOR   APPearance CLOUDY (A) CLEAR   Specific Gravity, Urine 1.018 1.005 - 1.030   pH 5.5 5.0 - 8.0   Glucose, UA NEGATIVE NEGATIVE mg/dL   Hgb urine dipstick NEGATIVE NEGATIVE   Bilirubin Urine NEGATIVE NEGATIVE   Ketones, ur NEGATIVE NEGATIVE mg/dL   Protein, ur NEGATIVE NEGATIVE mg/dL   Nitrite POSITIVE (A) NEGATIVE   Leukocytes, UA MODERATE (A) NEGATIVE  Urine microscopic-add on     Status: Abnormal   Collection Time: 05/14/15  9:51 PM  Result Value Ref Range   Squamous Epithelial / LPF  0-5 (A) NONE SEEN   WBC, UA 0-5 0 - 5 WBC/hpf   RBC / HPF NONE SEEN 0 - 5 RBC/hpf   Bacteria, UA RARE (A) NONE SEEN   Crystals CA OXALATE CRYSTALS (A) NEGATIVE    Dg Chest Port 1 View  05/15/2015  CLINICAL DATA:  Shortness of breath. EXAM: PORTABLE CHEST 1 VIEW COMPARISON:  04/29/2015 FINDINGS: Patient is rotated towards the right. The aorta is tortuous and mildly calcified. Heart size is accentuated by the portable technique. The lungs are free of focal consolidations and pleural effusions. Mild mid thoracic spondylosis noted. IMPRESSION: No evidence for acute cardiopulmonary abnormality. Electronically Signed   By: Nolon Nations M.D.   On: 05/15/2015 08:50    Review of Systems  Unable to perform ROS: dementia   Blood pressure 129/72, pulse 86, temperature 97.7 F (36.5 C), temperature source Axillary, resp. rate 121, height '5\' 7"'$  (1.702 m), weight 62.4 kg (137 lb 9.1 oz), SpO2 100 %. Physical Exam  Constitutional:  Chronically ill woman, non responsive, very stiff.  Pretty much stays where ever you place her.  She had no response when I was examining decubitus.  HENT:  Head: Normocephalic and atraumatic.  Eyes:  Right eye exhibits no discharge. Left eye exhibits no discharge. No scleral icterus.  Neck: Normal range of motion. Neck supple. No JVD present. No tracheal deviation present. No thyromegaly present.  Cardiovascular: Normal rate, regular rhythm and normal heart sounds.   No murmur heard. Respiratory: Effort normal and breath sounds normal. No respiratory distress. She has no wheezes. She has no rales. She exhibits no tenderness.  GI: Soft. Bowel sounds are normal. She exhibits no distension and no mass. There is no tenderness. There is no rebound and no guarding.  Ostomy pink and working well.  Musculoskeletal: She exhibits edema.  She has mild trace to +1 edema of arms hands and legs.  Lymphadenopathy:    She has no cervical adenopathy.  Neurological:  She is awake, but  does not make eye contact, no response to voice or moving her around in bed.  Skin:  Decubitus pictured below.  It is about 7 cm in diameter.  It is 2-3 cm deep, with some undermining about 2 cm deep.  The surface of the perimeter has some dead tissue and something that may have been a dressing around the edges.  Some slough inside the open decubitus, but as before I would estimate about 80 % is clean.  I did use 4 x 4's to debride the surface and inside of the ulcer and get some of the dark easily removed tissue.  She had no response to working with her or during the cleaning of the site.  i repacked it with about 1/4 Kerlix, and placed DSD over it.  Psychiatric:  She does not respond to verbal, or visual clues,  She did not respond to being placed on her side, nor turning over to the other side after we worked on her wound.  She pretty much leaves her arms, hands and legs where ever you place them.     05/15/15  She is lying on her left side.  7 CM diameter going from 12 o'clock to 6 o'clock.  Up to 3 cm deep with up to 2 cm undermining.    Skin excoriation around the site also.     Assessment/Plan: Sacral decubitus with prior debridement, bone biopsy and diverting colostomy 11/2014. Hx of MRSA blood cultures, and MRSA from bone biopsy 11/2014.  Followed by Frank Dementia Malnutrition, deconditioning and now bedridden Hx of hypertension  Plan:  I see nothing that cannot be treated with local wound care and hydrotherapy.  Her husband relates she changed over the weekend, but has been bed ridden at least since her prior hospitalization in February, but it sounds like it may have been longer than that. I will order hydrotherapy and dressing changes.  Diverting ostomy is working well.    Gwendy Boeder 05/15/2015, 9:55 AM

## 2015-05-15 NOTE — Clinical Social Work Placement (Signed)
   CLINICAL SOCIAL WORK PLACEMENT  NOTE  Date:  05/15/2015  Patient Details  Name: [name] Frank MRN: [id number] Date of Birth: Jul 18, 1934  Clinical Social Work is seeking post-discharge placement for this patient at the Skilled  Nursing Facility level of care (*CSW will initial, date and re-position this form in  chart as items are completed):  Yes   Patient/family provided with [facility] Social Work Department's list of facilities offering this level of care within the geographic area requested by the patient (or if unable, by the patient's family).  Yes   Patient/family informed of their freedom to choose among providers that offer the needed level of care, that participate in Medicare, Medicaid or managed care program needed by the patient, have an available bed and are willing to accept the patient.  Yes   Patient/family informed of [facility]'s ownership interest in [facility] and [facility], as well as of the fact that they are under no obligation to receive care at these facilities.  PASRR submitted to EDS on       PASRR number received on       Existing PASRR number confirmed on       FL2 transmitted to all facilities in geographic area requested by pt/family on 05/15/15     FL2 transmitted to all facilities within larger geographic area on       Patient informed that his/her managed care company has contracts with or will negotiate with certain facilities, including the following:            Patient/family informed of bed offers received.  Patient chooses bed at       Physician recommends and patient chooses bed at      Patient to be transferred to   on  .  Patient to be transferred to facility by       Patient family notified on   of transfer.  Name of family member notified:        PHYSICIAN Please sign FL2     Additional Comment:    _______________________________________________ Orson Gear, Student-SW 05/15/2015, 11:12  AM

## 2015-05-15 NOTE — Progress Notes (Signed)
PATIENT DETAILS Name: [name] Frank Age: 81 y.o. Sex: female Date of Birth: [birth year] Admit Date: 05/14/2015 Admitting Physician Eston Esters, MD ZOX:[id number] W, MD  Subjective: Sleeping comfortably  Assessment/Plan: Active Problems: Sepsis: Secondary to infected stage IV decubitus ulcer. Chest x-ray/UA negative for infection. Continue empiric vancomycin and Zosyn, await culture data.  Infected Sacral decubitus ulcer: Has a history of chronic sacral stage IV decubitus ulcer, also has a prior history of pelvic osteomyelitis. However her most recent MRI on 04/30/15 was negative for osteomyelitis. Gen. surgery consulted, recommendations are for hydrotherapy and wound care. Continue empiric antibiotics, await cultures.   Hypertension: Appears controlled, continue amlodipine and metoprolol.  Failure to thrive syndrome: Nutrition eval, PT/OT evaluation.  Dementia:  Pleasantly confused-but answers most of my questions appropriately. Resume Aricept.  Palliative care:  Elderly frail patient with dementia, chronic sacral decubitus ulcer requiring a diverting colostomy with recent admission for infected sacral decubitus, readmitted on 05/14/15 for a infected sacral decubitus ulcer causing sepsis. Fortunately appears to have deteriorated functionally over the past few months-now essentially bedbound, with recurrent purulent infection of stage IV sacral decubitus. Unfortunately, poor overall long-term prognoses. I will consult palliative care.  Disposition: Remain inpatient  Antimicrobial agents  See below  Anti-infectives    Start     Dose/Rate Route Frequency Ordered Stop   05/15/15 2000  vancomycin (VANCOCIN) IVPB 1000 mg/200 mL premix     1,000 mg 200 mL/hr over 60 Minutes Intravenous Every 24 hours 05/14/15 2251     05/15/15 0400  piperacillin-tazobactam (ZOSYN) IVPB 3.375 g     3.375 g 12.5 mL/hr over 240 Minutes Intravenous 3 times per day 05/14/15 2250       05/14/15 2045  ampicillin-sulbactam (UNASYN) 1.5 g in sodium chloride 0.9 % 50 mL IVPB  Status:  Discontinued     1.5 g 100 mL/hr over 30 Minutes Intravenous Every 6 hours 05/14/15 2038 05/14/15 2245   05/14/15 2030  vancomycin (VANCOCIN) IVPB 1000 mg/200 mL premix     1,000 mg 200 mL/hr over 60 Minutes Intravenous  Once 05/14/15 2022 05/14/15 2136      DVT Prophylaxis: Prophylactic Lovenox   Code Status: Full code   Family Communication Roy Talford-spouse-[phone]-tried calling-no answer-unable to leave a message  Procedures: None  CONSULTS:  general surgery  Time spent 30 minutes-Greater than 50% of this time was spent in counseling, explanation of diagnosis, planning of further management, and coordination of care.  MEDICATIONS: Scheduled Meds: . [COMPLETED] sodium chloride   Intravenous STAT  . amLODipine  5 mg Oral Daily  . collagenase   Topical Daily  . cyanocobalamin  500 mcg Oral Daily  . feeding supplement  1 Container Oral TID BM  . feeding supplement (PRO-STAT SUGAR FREE 64)  30 mL Oral TID WC  . megestrol  200 mg Oral BID  . metoprolol succinate  25 mg Oral Daily  . multivitamin with minerals  1 tablet Oral Daily  . piperacillin-tazobactam (ZOSYN)  IV  3.375 g Intravenous 3 times per day  . vancomycin  1,000 mg Intravenous Q24H  . vitamin C  500 mg Oral BID   Continuous Infusions:  PRN Meds:.    PHYSICAL EXAM: Vital signs in last 24 hours: Filed Vitals:   05/14/15 2200 05/14/15 2230 05/14/15 2344 05/15/15 0418  BP: 136/74 118/78 129/64 129/72  Pulse: 99 84 82 86  Temp:   98.2 F (36.8  C) 97.7 F (36.5 C)  TempSrc:   Axillary Axillary  Resp: 121  Height:    (1.702 m)   Weight:   62.4 kg (137 lb 9.1 oz)   SpO2: 100% 100% 99% 100%    Weight change:  Filed Weights   05/14/15 1910 05/14/15 2344  Weight: 54.432 kg (120 lb) 62.4 kg (137 lb 9.1 oz)   Body mass index is 21.54 kg/(m^2).   Gen Exam: Sleeping-but awake-and  answers most of my questions appropriately. Slow but clear speech.   Neck: Supple, No JVD.   Chest: B/L Clear.   CVS: S1 S2 Regular, no murmurs.  Abdomen: soft, BS +, non tender, non distended. Colostomy in place-brown stools in colostomy  Extremities: no edema, lower extremities warm to touch. Neurologic: Non Focal-but with generalized weakness  Skin: No Rash.   Wounds: N/A.  Intake/Output from previous day:  Intake/Output Summary (Last 24 hours) at 05/15/15 1415 Last data filed at 05/15/15 0634  Gross per 24 hour  Intake  982.5 ml  Output    100 ml  Net  882.5 ml     LAB RESULTS: CBC  Recent Labs Lab 05/14/15 1941  WBC 25.6*  HGB 13.3  HCT 41.2  PLT 386  MCV 85.7  MCH 27.7  MCHC 32.3  RDW 16.3*  LYMPHSABS 1.8  MONOABS 1.5*  EOSABS 1.0*  BASOSABS 0.0    Chemistries   Recent Labs Lab 05/14/15 1941  NA 145  K 3.7  CL 106  CO2 23  GLUCOSE 92  BUN 19  CREATININE 0.57  CALCIUM 9.6    CBG: No results for input(s): GLUCAP in the last 168 hours.  GFR Estimated Creatinine Clearance: 56.4 mL/min (by C-G formula based on Cr of 0.57).  Coagulation profile No results for input(s): INR, PROTIME in the last 168 hours.  Cardiac Enzymes No results for input(s): CKMB, TROPONINI, MYOGLOBIN in the last 168 hours.  Invalid input(s): CK  Invalid input(s): POCBNP No results for input(s): DDIMER in the last 72 hours. No results for input(s): HGBA1C in the last 72 hours. No results for input(s): CHOL, HDL, LDLCALC, TRIG, CHOLHDL, LDLDIRECT in the last 72 hours. No results for input(s): TSH, T4TOTAL, T3FREE, THYROIDAB in the last 72 hours.  Invalid input(s): FREET3 No results for input(s): VITAMINB12, FOLATE, FERRITIN, TIBC, IRON, RETICCTPCT in the last 72 hours. No results for input(s): LIPASE, AMYLASE in the last 72 hours.  Urine Studies No results for input(s): UHGB, CRYS in the last 72 hours.  Invalid input(s): UACOL, UAPR, USPG, UPH, UTP, UGL, UKET,  UBIL, UNIT, UROB, ULEU, UEPI, UWBC, URBC, UBAC, CAST, UCOM, BILUA  MICROBIOLOGY: No results found for this or any previous visit (from the past 240 hour(s)).  RADIOLOGY STUDIES/RESULTS: Mr Pelvis W Wo Contrast  04/30/2015  CLINICAL DATA:  Stage IV sacral decubitus ulcer with fever and elevated white blood count. Osteomyelitis of the sacrum. EXAM: MRI PELVIS WITHOUT AND WITH CONTRAST TECHNIQUE: Multiplanar multisequence MR imaging of the pelvis was performed both before and after administration of intravenous contrast. CONTRAST:  10mL MULTIHANCE GADOBENATE DIMEGLUMINE 529 MG/ML IV SOLN COMPARISON:  MRI dated 11/15/2014 and CT scan dated 11/15/2014 FINDINGS: There is a midline sacral decubitus ulcer. The ulcer extends just superficial to the distal sacrum. There is either been resection or destruction of the fourth and fifth sacral segments and a partial resection of the proximal coccyx since the prior exam. There is new enhancement of the gluteus maximus muscles  adjacent to tear sacral attachments and just medial and lateral to the sacral decubitus ulcer, consistent with myositis. There is no definable abscess or current osteomyelitis. Ostomy is noted in the left lower quadrant. There is no adenopathy or mass lesion. There is atrophy of the muscles of the hips in both proximal thighs. Right hip prosthesis noted. IMPRESSION: 1. No evidence of current osteomyelitis or soft tissue abscess. 2. Deep sacral decubitus ulcer with adjacent focal myositis of the gluteus maximus muscles. I Electronically Signed   By: Francene Boyers M.D.   On: 04/30/2015 10:16   Dg Chest Port 1 View  05/15/2015  CLINICAL DATA:  Shortness of breath. EXAM: PORTABLE CHEST 1 VIEW COMPARISON:  04/29/2015 FINDINGS: Patient is rotated towards the right. The aorta is tortuous and mildly calcified. Heart size is accentuated by the portable technique. The lungs are free of focal consolidations and pleural effusions. Mild mid thoracic spondylosis  noted. IMPRESSION: No evidence for acute cardiopulmonary abnormality. Electronically Signed   By: Norva Pavlov M.D.   On: 05/15/2015 08:50   Dg Chest Port 1 View  04/29/2015  CLINICAL DATA:  81 year old female with fever and wound infection EXAM: PORTABLE CHEST 1 VIEW COMPARISON:  Prior chest x-ray 11/24/2014 FINDINGS: Low inspiratory volumes with mild bibasilar atelectasis. Stable cardiac and mediastinal contours. Atherosclerotic calcifications throughout the thoracic aorta. Stable mild chronic bronchitic change. No pulmonary edema, focal consolidation or pneumothorax. No acute osseous abnormality. IMPRESSION: 1. Low inspiratory volumes with mild bibasilar atelectasis. 2. Otherwise, stable chest x-ray without acute cardiopulmonary process. Electronically Signed   By: Malachy Moan M.D.   On: 04/29/2015 16:44    Jeoffrey Massed, MD  [facility] Pager:[phone]  If 7PM-7AM, please contact night-coverage [email or web address] Password [email or web address] 05/15/2015, 2:15 PM   LOS: 1 day

## 2015-05-15 NOTE — Evaluation (Signed)
Clinical/Bedside Swallow Evaluation Patient Details  Name: [name] Frank MRN: [id number] Date of Birth: 1934-12-02  Today's Date: 05/15/2015 Time: SLP Start Time (ACUTE ONLY): 1638 SLP Stop Time (ACUTE ONLY): 4536 SLP Time Calculation (min) (ACUTE ONLY): 11 min  Past Medical History:  Past Medical History  Diagnosis Date  . Allergy   . Hyperlipidemia   . Hypertension   . Anxiety   . GERD (gastroesophageal reflux disease)   . Urinary frequency   . Neurogenic urinary incontinence     since back surgery  . Arthritis     degenerative oateoarthritis  . Decubital ulcer 11/14/2014  . Dementia    Past Surgical History:  Past Surgical History  Procedure Laterality Date  . Appendectomy    . Cholecystectomy    . Abdominal hysterectomy    . Back surgery  2000  . Colonoscopy w/ polypectomy    . Breast surgery  1993    biopsy  . Total hip arthroplasty  01/07/2012    Procedure: TOTAL HIP ARTHROPLASTY;  Surgeon: Yvette Rack., MD;  Location: [place];  Service: Orthopedics;  Laterality: Right;  . Bone marrow biopsy N/A 11/22/2014    Procedure: SACRAL BONE BIOPSY PRONE POSITION;  Surgeon: Georganna Skeans, MD;  Location: [place];  Service: General;  Laterality: N/A;  . Ileo loop colostomy closure N/A 11/22/2014    Procedure: LAPAROSCOPIC LOOP COLOSTOMY ;  Surgeon: Georganna Skeans, MD;  Location: [facility];  Service: General;  Laterality: N/A;   HPI:  81 yo female with sepsis secondary to infected stage IV decubitus ulcer. CXR negative upon admission. PMH: dementia, HTN, GERD, recent admission for infection of decubitus ulcer.   Assessment / Plan / Recommendation Clinical Impression  Pt's oropharyngeal swallow is WFL and no overt s/s of aspiration are observed. Recommend to initiate regular diet and thin liquids. SLP to sign off.    Aspiration Risk  Mild aspiration risk    Diet Recommendation Regular;Thin liquid   Liquid Administration via: Cup;Straw Medication Administration: Whole meds  with liquid Supervision: Patient able to self feed;Staff to assist with self feeding;Full supervision/cueing for compensatory strategies Compensations: Minimize environmental distractions;Slow rate;Small sips/bites Postural Changes: Seated upright at 90 degrees    Other  Recommendations Oral Care Recommendations: Oral care BID   Follow up Recommendations  None;24 hour supervision/assistance    Frequency and Duration            Prognosis        Swallow Study   General HPI: 81 yo female with sepsis secondary to infected stage IV decubitus ulcer. CXR negative upon admission. PMH: dementia, HTN, GERD, recent admission for infection of decubitus ulcer. Type of Study: Bedside Swallow Evaluation Previous Swallow Assessment: none in chart Diet Prior to this Study: NPO Temperature Spikes Noted: No Respiratory Status: Room air History of Recent Intubation: No Behavior/Cognition: Alert;Cooperative;Pleasant mood;Requires cueing Oral Cavity Assessment: Within Functional Limits Oral Care Completed by SLP: No Oral Cavity - Dentition: Missing dentition Vision: Functional for self-feeding Self-Feeding Abilities: Able to feed self;Needs assist Patient Positioning: Upright in bed Baseline Vocal Quality: Normal    Oral/Motor/Sensory Function Overall Oral Motor/Sensory Function: Within functional limits   Ice Chips Ice chips: Not tested   Thin Liquid Thin Liquid: Within functional limits Presentation: Cup;Self Fed;Straw    Nectar Thick Nectar Thick Liquid: Not tested   Honey Thick Honey Thick Liquid: Not tested   Puree Puree: Within functional limits Presentation: Spoon   Solid   GO   Solid: Within  functional limits       Germain Osgood, M.A. CCC-SLP [phone]  Germain Osgood 05/15/2015,4:37 PM

## 2015-05-15 NOTE — Consult Note (Addendum)
WOC wound consult note Pt is familiar to WOC from previous admission; refer to note on 2/15. Reason for Consult: Consult requested for sacrum wound.Wound has declined since previous discharge.Pt has multiple systemic factors which can impair healing. Wound type: Stage 4 pressure injury to sacrum Pressure Ulcer POA: Yes Measurement: 5X5X2.5 cm with tunneling at 12:00 o'clock to 1 cm,  bone palpable Wound bed: 70% black, 30% red.  Drainage (amount, consistency, odor) Mod amt tan drainage, slight odor.  Periwound: Black wound edges, moist and macerated. Dressing procedure/placement/frequency:  Pt had surgical consult performed earlier today to assess wound; their team ordered hydrotherapy to assist with removal of nonviable tissue, which will be performed by the PT department.Santyl ointment will assist with enzymatic debridement.  Foam dressing to reduce stripping and maceration to wound edges. Air mattress to reduce pressure.   WOC ostomy consult note Pt requires total assistance with pouch application and emptying. Stoma type/location: Stoma red and viable to LLQ; according to EMR surgery was in October 2016 Stomal assessment/size: Approx 1 inch, slightly above skin level, area of hypergranulation at 12:00 o'clock near stoma is approx .5X.5cm, red and raised above skin level. Peristomal assessment: Intact skin surrounding Output: Large amt formed brown stool in the pouch  Ostomy pouching: 1 piece  Pt has total assistance at the SNF for pouch application and emptying. Applied one piece pouch with barrier ring to maintain seal. Extra supplies left at the bedside for staff nurse use.No family present to discuss plan of care. Please re-consult if further assistance is needed. Thank-you,  Cammie Mcgee MSN, RN, CWOCN, [place] [place] [facility], CNS [phone]

## 2015-05-15 NOTE — Clinical Social Work Note (Signed)
Patient has a bed at [facility]. BSW intern has contacted Janie at facility to make her aware. Patient to d/c to SNF once medically stable.  BSW intern remains available.  Orson Gear- BSW intern [phone]

## 2015-05-15 NOTE — Clinical Social Work Note (Signed)
Clinical Social Work Assessment  Patient Details  Name: [name] Frank MRN: [id number] Date of Birth: 03-Aug-1934  Date of referral:  05/15/15               Reason for consult:  Facility Placement, Discharge Planning                Permission sought to share information with:  Family Supports Permission granted to share information::  Yes, Verbal Permission Granted  Name::      Teofila Bowery, spouse)  Agency::   (SNF)  Relationship::   (spouse)  Contact Information:   [phone])  Housing/Transportation Living arrangements for the past 2 months:  Skilled Nursing Facility Source of Information:  Spouse Patient Interpreter Needed:  None Criminal Activity/Legal Involvement Pertinent to Current Situation/Hospitalization:  No - Comment as needed Significant Relationships:  Spouse Lives with:  Facility Resident Do you feel safe going back to the place where you live?  No Need for family participation in patient care:  Yes (Comment)  Care giving concerns:   [name] has expressed interest in transferring patient to different SNF. Unfortunately, patient's family is very unhappy with care provided at current facility.   Social Worker assessment / plan:   Patient alert to self only. BSW intern has spoken with patient's spouse, Channing Mutters at bedside to discuss discharge disposition. Patient is from [facility]. However, patient's family has been expressing interest in transferring patient to alternate SNF location due to lack of proper care. Per patient's spouse, patient is a long-term resident. Patient is from [place] but prefers to be placed in [facility] if possible. [name] stated that this would be easier for patient's brother to visit when necessary. [name] prefers [facility]. BSW intern explained SNF process as well as insurance coverage. BSW to follow up with patient's family with extended bed offers once clinical have  been reviewed by facilities. At this time, patient's plan of d/c is unknown. BSW intern remains available.  Employment status:  Retired Health and safety inspector:  Medicare PT Recommendations:  Skilled Nursing Facility Information / Referral to community resources:  Skilled Nursing Facility  Patient/Family's Response to care:   [name] was  very receptive and appreciated Social Work intervention from [employer].  [name] is hopeful in finding the most appropriate SNF for patient to where adequate amount of care and rehab is received.  Patient/Family's Understanding of and Emotional Response to Diagnosis, Current Treatment, and Prognosis:   [name] is very supportive and understands need for further medical care at SNF.  Emotional Assessment Appearance:   (unable to assess) Attitude/Demeanor/Rapport:  Unable to Assess Affect (typically observed):  Unable to Assess Orientation:  Oriented to Self Alcohol / Substance use:  Not Applicable Psych involvement (Current and /or in the community):  No (Comment)  Discharge Needs  Concerns to be addressed:  No discharge needs identified Readmission within the last 30 days:  No Current discharge risk:  None Barriers to Discharge:  No Barriers Identified   Orson Gear, Student-SW 05/15/2015, 1:02 PM

## 2015-05-15 NOTE — NC FL2 (Signed)
[place] MEDICAID FL2 LEVEL OF CARE SCREENING TOOL     IDENTIFICATION  Patient Name: [name] Frank Birthdate: 09/03/1934 Sex: female Admission Date (Current Location): 05/14/2015  [facility] and [place] Number:  Haynes Bast  [facility] MEDICARE/UHC MEDICARE- [id number]) Facility and Address:  [facility], [address] [place], [place] [address]      Provider Number: [id number]  Attending Physician Name and Address:  Maretta Bees, MD  Relative Name and Phone Number:   Shemika Robbs, spouse- [phone])    Current Level of Care: Hospital Recommended Level of Care: Skilled Nursing Facility Prior Approval Number:    Date Approved/Denied:   PASRR Number:    Discharge Plan: SNF    Current Diagnoses: Patient Active Problem List   Diagnosis Date Noted  . Sacral decubitus ulcer 05/14/2015  . Malnutrition of moderate degree 05/01/2015  . Wound infection (HCC) 04/29/2015  . Colostomy in place [facility]) 01/06/2015  . Catheter-associated urinary tract infection (HCC) 11/26/2014  . Osteomyelitis due to staphylococcus aureus (HCC) 11/25/2014  . Bacteremia 11/25/2014  . Postprocedural fever 11/24/2014  . UTI (urinary tract infection) 11/24/2014  . Palliative care encounter 11/22/2014  . DNR (do not resuscitate) discussion 11/22/2014  . Osteomyelitis, pelvic region and thigh (HCC)   . Goals of care, counseling/discussion   . Enterococcus faecalis infection   . Protein-calorie malnutrition, severe (HCC) 11/15/2014  . Decubitus ulcer of sacral region, stage 4 (HCC) 11/14/2014  . Stage IV decubitus ulcer (HCC) 11/14/2014  . Cognitive changes 06/19/2014  . GERD (gastroesophageal reflux disease) 08/17/2013  . Postoperative anemia due to acute blood loss 01/08/2012  . Osteoarthritis 09/23/2010  . Lumbago 03/23/2010  . OVERACTIVE BLADDER 12/16/2009  . EDEMA 04/25/2009  . DEGENERATIVE JOINT DISEASE, HIPS 09/20/2008  . HYPERLIPIDEMIA 07/10/2008   . Essential hypertension 07/10/2008  . URINARY INCONTINENCE 07/10/2008    Orientation RESPIRATION BLADDER Height & Weight     Self  Normal Incontinent Weight: 137 lb 9.1 oz (62.4 kg) Height:   (170.2 cm)  BEHAVIORAL SYMPTOMS/MOOD NEUROLOGICAL BOWEL NUTRITION STATUS      Incontinent Diet (NPO)  AMBULATORY STATUS COMMUNICATION OF NEEDS Skin       PU Stage and Appropriate Care                       Personal Care Assistance Level of Assistance              Functional Limitations Info             SPECIAL CARE FACTORS FREQUENCY                       Contractures      Additional Factors Info  Code Status, Allergies, Isolation Precautions Code Status Info:  (PRIOR) Allergies Info:  (Augmentin, Codeine Sulfate, Tramadol Hcl)     Isolation Precautions Info:  (MRSA)     Current Medications (05/15/2015):  This is the current hospital active medication list Current Facility-Administered Medications  Medication Dose Route Frequency Provider Last Rate Last Dose  . [COMPLETED] 0.9 %  sodium chloride infusion   Intravenous STAT Arby Barrette, MD 150 mL/hr at 05/15/15 [id number]    . amLODipine (NORVASC) tablet 5 mg  5 mg Oral Daily Eston Esters, MD      . cyanocobalamin tablet 500 mcg  500 mcg Oral Daily Eston Esters, MD      . feeding supplement (BOOST / RESOURCE BREEZE) liquid 1  Container  1 Container Oral TID BM Eston Esters, MD      . feeding supplement (PRO-STAT SUGAR FREE 64) liquid 30 mL  30 mL Oral TID WC Eston Esters, MD      . megestrol (MEGACE) 40 MG/ML suspension 200 mg  200 mg Oral BID Eston Esters, MD   200 mg at 05/15/15 0047  . metoprolol succinate (TOPROL-XL) 24 hr tablet 25 mg  25 mg Oral Daily Eston Esters, MD   25 mg at 05/15/15 0942  . multivitamin with minerals tablet 1 tablet  1 tablet Oral Daily Eston Esters, MD      . piperacillin-tazobactam (ZOSYN) IVPB 3.375 g  3.375 g Intravenous 3 times per day Armandina Stammer, RPH   3.375 g at 05/15/15 0344   . vancomycin (VANCOCIN) IVPB 1000 mg/200 mL premix  1,000 mg Intravenous Q24H Armandina Stammer, RPH      . vitamin C (ASCORBIC ACID) tablet 500 mg  500 mg Oral BID Eston Esters, MD   500 mg at 05/15/15 4098     Discharge Medications: Please see discharge summary for a list of discharge medications.  Relevant Imaging Results:  Relevant Lab Results:   Additional Information  (SSN: [id number])  Orson Gear, Student-SW [phone]

## 2015-05-15 NOTE — Progress Notes (Signed)
Hydro Cancellation Note  Patient Details Name: [name] Frank MRN: [id number] DOB: 1934/08/24   Cancelled Treatment:    Reason Eval/Treat Not Completed: Other (comment). Dressing changed late morning by MD. Will initiate hydrotherapy in the AM.   [facility] 05/15/2015, 1:48 PM [facility] PT [phone]

## 2015-05-15 NOTE — Progress Notes (Signed)
Initial Nutrition Assessment  DOCUMENTATION CODES:   Non-severe (moderate) malnutrition in context of chronic illness  INTERVENTION:  -Provide Prostat 30 mL TID, 100 kcal, 15 grams of protein per pack. -Boost Breeze TID, 250 kcal, 9 grams of protein per serving.  NUTRITION DIAGNOSIS:   Increased nutrient needs related to wound healing as evidenced by estimated needs.  GOAL:   Patient will meet greater than or equal to 90% of their needs  MONITOR:   PO intake, Supplement acceptance, Weight trends, Skin, Labs  REASON FOR ASSESSMENT:   Malnutrition Screening Tool  ASSESSMENT:   Pt with history of dementia, HTN, stage IV decub ulcer who was admitted recently for wound infection   Pt seen for MST. Pt's husband pt's appetite is adequate. Report that intake decline once pt admitted to nursing facility due to not liking taste of food. Husband reports bringing sandwiches to pt's nursing facility to supplement intake. Family reports decline in pt's overall health since admitting to nursing facility 4 months ago, report pt now needs feeding assistance at every meal. Family states she is on a regular diet PTA, deny swallowing or chewing issues. PTA pt was consuming boost breeze TID to help meet nutritional needs, will continue boost breeze while admitted. Will provide Prostat TID to promote wound healing.  Reviewed pt's weight history noted positive steady weight gain over past 6 months. Family unable to provide clear weight history.   Conducted nutrition focused physical exam, identified mild/moderate muscle and fat wasting.   Pt discussed with nurse. Pt is cleared for diet advancement, pt to receive regular diet at dinner time.  Medications reviewed; cyanocobalamin 500 mcg, multivitamin 1 tablet daily, Vitamin C 500 mg BID  Labs reviewed.     Diet Order:  Diet NPO time specified  Skin:  Wound (see comment) (Stage IV sacrum wound)  Last BM:  Unkown  Height:   Ht Readings from  Last 1 Encounters:  05/14/15  (1.702 m)    Weight:   Wt Readings from Last 1 Encounters:  05/14/15 137 lb 9.1 oz (62.4 kg)    Ideal Body Weight:  61.4 kg  BMI:  Body mass index is 21.54 kg/(m^2).  Estimated Nutritional Needs:   Kcal:  1800-2000  Protein:  90-105 grams  Fluid:  1.8-2 L  EDUCATION NEEDS:   No education needs identified at this time  [place], Dietetic Intern Pager: [phone]

## 2015-05-15 NOTE — Progress Notes (Signed)
Received patient from ED. Patient is sleeping but response to voice.  VS stable and O2Sat at 99% on RA.  Family members at bedside but left after 15 minutes. Patient noted to have stage IV decubitus ulcer at sacrum and LUQ colostomy. Patient is resting on bed comfortably with both eyes close.

## 2015-05-15 NOTE — Care Management Note (Signed)
Case Management Note  Patient Details  Name: [name] Frank MRN: [id number] Date of Birth: [birth year]  Subjective/Objective:                    Action/Plan:  Initial UR completed  Expected Discharge Date:                  Expected Discharge Plan:  Skilled Nursing Facility  In-House Referral:  Clinical Social Work  Discharge planning Services     Post Acute Care Choice:    Choice offered to:     DME Arranged:    DME Agency:     HH Arranged:    HH Agency:     Status of Service:  In process, will continue to follow  Medicare Important Message Given:    Date Medicare IM Given:    Medicare IM give by:    Date Additional Medicare IM Given:    Additional Medicare Important Message give by:     If discussed at Long Length of Stay Meetings, dates discussed:    Additional Comments:  Kingsley Plan, RN 05/15/2015, 10:33 AM

## 2015-05-16 DIAGNOSIS — R627 Adult failure to thrive: Secondary | ICD-10-CM | POA: Diagnosis present

## 2015-05-16 DIAGNOSIS — Z515 Encounter for palliative care: Secondary | ICD-10-CM

## 2015-05-16 DIAGNOSIS — E46 Unspecified protein-calorie malnutrition: Secondary | ICD-10-CM

## 2015-05-16 LAB — BASIC METABOLIC PANEL
ANION GAP: 12 (ref 5–15)
BUN: 11 mg/dL (ref 6–20)
CO2: 21 mmol/L — ABNORMAL LOW (ref 22–32)
Calcium: 8.7 mg/dL — ABNORMAL LOW (ref 8.9–10.3)
Chloride: 108 mmol/L (ref 101–111)
Creatinine, Ser: 0.67 mg/dL (ref 0.44–1.00)
GFR calc Af Amer: >60 mL/min (ref >60)
Glucose, Bld: 148 mg/dL — ABNORMAL HIGH (ref 65–99)
POTASSIUM: 4.1 mmol/L (ref 3.5–5.1)
SODIUM: 141 mmol/L (ref 135–145)

## 2015-05-16 LAB — CBC
HCT: 37.2 % (ref 36.0–46.0)
HEMOGLOBIN: 12.2 g/dL (ref 12.0–15.0)
MCH: 27.9 pg (ref 26.0–34.0)
MCHC: 32.8 g/dL (ref 30.0–36.0)
MCV: 84.9 fL (ref 78.0–100.0)
PLATELETS: 373 10*3/uL (ref 150–400)
RBC: 4.38 MIL/uL (ref 3.87–5.11)
RDW: 16 % — ABNORMAL HIGH (ref 11.5–15.5)
WBC: 14.9 10*3/uL — AB (ref 4.0–10.5)

## 2015-05-16 NOTE — Evaluation (Signed)
Occupational Therapy Evaluation Patient Details Name: [name] Frank MRN: [id number] DOB: 24-Feb-1935 Today's Date: 05/16/2015    History of Present Illness Patient is a 81 yo female with history of dementia, HTN, stage IV decub ulcer who was admitted recently for wound infection and treated empirically with vancomycin then sent to NH with wound care, not clear that she was discharge with antibiotics. She has dementia and she can't communicate any complaints today   Clinical Impression   Pt supine in bed with husband present in room. Pt oriented to self only. Pt inconsistently following 1 step verbal commands. OT assisted pt to EOB with total A and pt sat EOB for 3 minutes with total A and B LEs blocked. Her husband reported that pt has not ambulated for several months now. She stood once 1-2 months ago but unclear as to how much assistance was needed at that encounter to accomplish task. Pt has needed assist with self care for several years and staff have been assisting with self care at [facility]. OT discussed positioning with caregiver and placed pt on L side for pressure relief. No further acute OT needs at this time. OT will sign off. Thank you for referral.     Follow Up Recommendations  SNF          Precautions / Restrictions Precautions Precautions: Fall Precaution Comments: stage IV ulcer Restrictions Weight Bearing Restrictions: No      Mobility Bed Mobility Overal bed mobility: Needs Assistance Bed Mobility: Rolling;Supine to Sit;Sit to Supine Rolling: Total assist   Supine to sit: Total assist Sit to supine: Total assist   General bed mobility comments: total A and  ptwas unable to follow 1 step verbal commands consistently.      Balance Overall balance assessment: Needs assistance Sitting-balance support: Bilateral upper extremity supported;Feet supported Sitting balance-Leahy Scale: Zero Sitting balance - Comments: total A seated on EOB for 3 minutes            ADL Overall ADL's : At baseline            General ADL Comments: Pt has been living at [facility] for 5 months per caregiver. Pt was able to initiate washing face with mod cues and hand over hand for thoroughness. Caregiver reports that staff assist pt from bed level with self care at SNF. From previous hospitalization family reports pt needed assist for years prior to SNF placement as well.                Pertinent Vitals/Pain Pain Assessment: Faces Faces Pain Scale: Hurts little more Pain Location: buttocks - when sitting EOB Pain Intervention(s): Repositioned;Monitored during session;Limited activity within patient's tolerance     Hand Dominance Right   Extremity/Trunk Assessment Upper Extremity Assessment Upper Extremity Assessment: Generalized weakness   Lower Extremity Assessment Lower Extremity Assessment: Defer to PT evaluation       Communication Communication Communication: No difficulties   Cognition Arousal/Alertness: Awake/alert Behavior During Therapy: Flat affect Overall Cognitive Status: History of cognitive impairments - at baseline                                Home Living Family/patient expects to be discharged to:: Skilled nursing facility          Prior Functioning/Environment Level of Independence: Needs assistance  Gait / Transfers Assistance Needed: husband reports wife has not stood for 2 month and has not ambulated for 3-5  months. ADL's / Homemaking Assistance Needed: Spouse reports staff at SNF assist pt with bathing and dressing. Pt is incontinent of bowel and bladder.         OT Diagnosis: Generalized weakness         OT Goals(Current goals can be found in the care plan section) Acute Rehab OT Goals Patient Stated Goal: none stated   End of Session Nurse Communication: Mobility status;Precautions  Activity Tolerance: Patient tolerated treatment well Patient left: in bed;with call bell/phone within reach   Time:  1301-1330 OT Time Calculation (min): 29 min Charges:  OT General Charges $OT Visit: 1 Procedure OT Evaluation $OT Eval Moderate Complexity: 1 Procedure OT Treatments $Self Care/Home Management : 8-22 mins G-Codes:    Lowella GripPittman, Jessyca Sloan L, MS, OTR/L 05/16/2015, 2:02 PM

## 2015-05-16 NOTE — Evaluation (Signed)
Physical Therapy Evaluation Patient Details Name: [name] Frank MRN: [id number] DOB: [birth year] Today's Date: 05/16/2015   History of Present Illness  Patient is a 81 yo female with history of dementia, HTN, stage IV decub ulcer who was admitted recently for wound infection and treated empirically with vancomycin then sent to NH with wound care, not clear that she was discharge with antibiotics. She has dementia and she can't communicate any complaints today  Clinical Impression  Patient presents with problems listed below.  Will benefit from acute PT to address mobility issues.  Recommend patient return to SNF for continued therapy at discharge.    Follow Up Recommendations SNF;Supervision/Assistance - 24 hour    Equipment Recommendations  None recommended by PT    Recommendations for Other Services       Precautions / Restrictions Precautions Precautions: Fall Precaution Comments: stage IV ulcer Restrictions Weight Bearing Restrictions: No      Mobility  Bed Mobility Overal bed mobility: Needs Assistance Bed Mobility: Rolling;Sidelying to Sit;Sit to Sidelying Rolling: Total assist Sidelying to sit: Total assist;+2 for physical assistance     Sit to sidelying: Total assist;+2 for physical assistance General bed mobility comments: Patient required total assist for all bed mobility.  Required total assist to maintain sitting balance, leaning to left and posteriorly.  Patient sat EOB x 4 min, trying to maintain balance and hold head upright.  Returned to sidelying with total assist.  Was able to follow 1 step commands with increased time.  Transfers                    Ambulation/Gait                Stairs            Wheelchair Mobility    Modified Rankin (Stroke Patients Only)       Balance Overall balance assessment: Needs assistance Sitting-balance support: Bilateral upper extremity supported;Feet supported Sitting balance-Leahy Scale:  Zero Sitting balance - Comments: Total assist for balance in sitting Postural control: Posterior lean;Left lateral lean                                   Pertinent Vitals/Pain Pain Assessment: No/denies pain (Asked patient repeatedly during session - reports no pain)    Home Living Family/patient expects to be discharged to:: Skilled nursing facility                      Prior Function Level of Independence: Needs assistance   Gait / Transfers Assistance Needed: husband reports wife has not stood for 2 month and has not ambulated for 3-5 months.  Question if it has been longer due to wounds.  ADL's / Homemaking Assistance Needed: Spouse reports staff at SNF assist pt with bathing and dressing. Pt is incontinent of bowel and bladder.         Hand Dominance   Dominant Hand: Right    Extremity/Trunk Assessment   Upper Extremity Assessment: Generalized weakness           Lower Extremity Assessment: Generalized weakness;RLE deficits/detail;LLE deficits/detail RLE Deficits / Details: Strength grossly 2/5.  Difficult to get hip extension passively LLE Deficits / Details: Strength grossly 2/5; Difficulty moving LLE into extension     Communication   Communication: Expressive difficulties  Cognition Arousal/Alertness: Awake/alert Behavior During Therapy: Flat affect Overall Cognitive Status: History of cognitive impairments -  at baseline                      General Comments General comments (skin integrity, edema, etc.): Stage IV sacral pressure ulcer per chart    Exercises        Assessment/Plan    PT Assessment Patient needs continued PT services  PT Diagnosis Generalized weakness;Altered mental status;Difficulty walking   PT Problem List Decreased strength;Decreased range of motion;Decreased activity tolerance;Decreased balance;Decreased mobility;Decreased cognition;Decreased coordination;Decreased knowledge of use of DME;Decreased  skin integrity  PT Treatment Interventions Functional mobility training;Therapeutic activities;Balance training;Patient/family education;Cognitive remediation   PT Goals (Current goals can be found in the Care Plan section) Acute Rehab PT Goals Patient Stated Goal: none stated PT Goal Formulation: Patient unable to participate in goal setting Time For Goal Achievement: 05/30/15 Potential to Achieve Goals: Fair    Frequency Min 2X/week   Barriers to discharge   .    Co-evaluation               End of Session   Activity Tolerance: Patient limited by fatigue Patient left: in bed;with call bell/phone within reach           Time: 7829-56211516-1528 PT Time Calculation (min) (ACUTE ONLY): 12 min   Charges:   PT Evaluation $PT Eval Moderate Complexity: 1 Procedure     PT G CodesVena Austria:        Hanif Radin H 05/16/2015, 6:49 PM Durenda HurtSusan H. Renaldo Fiddleravis, PT, [facility] Acute Rehab Services Pager [phone]

## 2015-05-16 NOTE — Progress Notes (Signed)
Physical Therapy Wound Treatment Patient Details  Name: [name] Frank MRN: [id number] Date of Birth: Mar 10, 1935  Today's Date: 05/16/2015 Time: 4315-4008 Time Calculation (min): 40 min  Subjective  Subjective: Pt agreeable but mostly non-verbal during session.  Patient and Family Stated Goals: Pt didn't state Date of Onset:  (Prior to admission) Prior Treatments: Dressing changes, VAC  Pain Score:  Pt asleep during session. No pain noted.   Wound Assessment  Wound / Incision (Open or Dehisced) 04/30/15 Other (Comment) Sacrum Medial Stage IV approximate quarter size (Active)  Dressing Type Foam;Barrier Film (skin prep);Moist to dry 05/16/2015  8:52 AM  Dressing Changed Changed 05/16/2015  8:52 AM  Dressing Status Clean;Dry;Intact 05/16/2015  8:52 AM  Dressing Change Frequency Daily 05/16/2015  8:52 AM  Site / Wound Assessment Red;Yellow;Black;Brown 05/16/2015  8:52 AM  % Wound base Red or Granulating 30% 05/16/2015  8:52 AM  % Wound base Yellow 25% 05/16/2015  8:52 AM  % Wound base Black 45% 05/16/2015  8:52 AM  % Wound base Other (Comment) 0% 05/16/2015  8:52 AM  Peri-wound Assessment Denuded 05/16/2015  8:52 AM  Wound Length (cm) 5 cm 05/16/2015  8:52 AM  Wound Width (cm) 7.5 cm 05/16/2015  8:52 AM  Wound Depth (cm) 2.7 cm 05/16/2015  8:52 AM  Undermining (cm) 12 to 3 o'clock up to 3cm 05/16/2015  8:52 AM  Margins Unattached edges (unapproximated) 05/16/2015  8:52 AM  Closure None 05/16/2015  8:52 AM  Drainage Amount Minimal 05/16/2015  8:52 AM  Drainage Description Serous 05/16/2015  8:52 AM  Non-staged Wound Description Not applicable 08/19/6193  0:93 AM  Treatment Hydrotherapy (Pulse lavage);Packing (Saline gauze);Debridement (Selective) 05/16/2015  8:52 AM  Santyl applied to wound bed prior to applying dressing.    Hydrotherapy Pulsed lavage therapy - wound location: sacrum Pulsed Lavage with Suction (psi): 8 psi Pulsed Lavage with Suction - Normal Saline Used: 1000 mL Pulsed Lavage Tip: Tip with splash  shield Selective Debridement Selective Debridement - Location: sacrum Selective Debridement - Tools Used: Forceps;Scissors Selective Debridement - Tissue Removed: Yellow and black necrotic tissue   Wound Assessment and Plan  Wound Therapy - Assess/Plan/Recommendations Wound Therapy - Clinical Statement: Pt presents to hydrotherapy with chronic sacral wound. Can benefit from hydrotherapy to debride wound and decrease bioburden.  Wound Therapy - Functional Problem List: Decr time out of bed Factors Delaying/Impairing Wound Healing: Immobility;Polypharmacy;Multiple medical problems Hydrotherapy Plan: Debridement;Dressing change;Patient/family education;Pulsatile lavage with suction Wound Therapy - Frequency: 6X / week Wound Therapy - Follow Up Recommendations: Skilled nursing facility Wound Plan: See above  Wound Therapy Goals- Improve the function of patient's integumentary system by progressing the wound(s) through the phases of wound healing (inflammation - proliferation - remodeling) by: Decrease Necrotic Tissue to: 30% Decrease Necrotic Tissue - Progress: Goal set today Increase Granulation Tissue to: 70% Increase Granulation Tissue - Progress: Goal set today Goals/treatment plan/discharge plan were made with and agreed upon by patient/family: No, Patient unable to participate in goals/treatment/discharge plan and family unavailable Time For Goal Achievement: 7 days Wound Therapy - Potential for Goals: Good  Goals will be updated until maximal potential achieved or discharge criteria met.  Discharge criteria: when goals achieved, discharge from hospital, MD decision/surgical intervention, no progress towards goals, refusal/missing three consecutive treatments without notification or medical reason.  GP     Rolinda Roan 05/16/2015, 9:31 AM   Rolinda Roan, PT, DPT Acute Rehabilitation Services Pager: [phone]

## 2015-05-16 NOTE — Progress Notes (Signed)
PATIENT DETAILS Name: [name] Frank Age: 81 y.o. Sex: female Date of Birth: 1934-06-06 Admit Date: 05/14/2015 Admitting Physician Eston Esters, MD ZOX:[id number] W, MD  Brief narrative: 81 year old female with history of dementia, known stage IV sacral decubitus requiring a diverting colostomy with prior history of pelvic osteomyelitis-just discharged on 05/02/15-readmitted on 3/1 for sepsis secondary to infected sacral decubitus ulcer. Gen. surgery consulted, placed on broad-spectrum antibiotics. Slowly improving, given overall poor health and failure to thrive syndrome, palliative care consulted. See below for further details.  Subjective: Sleeping comfortably-easily arouses. Pleasantly confused.  Assessment/Plan: Active Problems: Sepsis: Secondary to infected stage IV decubitus ulcer. Chest x-ray/UA negative for infection. Anything improving with decreasing leukocytosis-Continue empiric vancomycin and Zosyn. Blood culture negative so far.  Infected Sacral decubitus ulcer: Has a history of chronic sacral stage IV decubitus ulcer, also has a prior history of pelvic osteomyelitis. However her most recent MRI on 04/30/15 was negative for osteomyelitis. Gen. surgery consulted, recommendations are for hydrotherapy and wound care. Continue empiric antibiotics-spoke with husband at bedside, explained poor overall prognoses, likely he will have recurrent issues with one infection. Await further recommendations from general surgery.   Hypertension: Appears controlled, continue amlodipine and metoprolol.  Failure to thrive syndrome: Continue supplements, continue care/PT OT evaluation. Poor overall prognosis-family aware.   Dementia:  Pleasantly confused-but answers most of my questions appropriately. Resume Aricept.  Moderate protein calorie malnutrition: Continue supplements.  Palliative care:  Elderly frail patient with dementia, chronic sacral decubitus ulcer  requiring a diverting colostomy with recent admission for infected sacral decubitus, readmitted on 05/14/15 for a infected sacral decubitus ulcer causing sepsis. Fortunately appears to have deteriorated functionally over the past few months-now essentially bedbound, with recurrent purulent infection of stage IV sacral decubitus. Unfortunately, poor overall long-term prognoses. Palliative care consultation obtained, recommendations are to continue with wound care, antibiotics, and have hospice/palliative care follow-up while at SNF. Made DO NOT RESUSCITATE by palliative care.   Disposition: Remain inpatient  Antimicrobial agents  See below  Anti-infectives    Start     Dose/Rate Route Frequency Ordered Stop   05/15/15 2000  vancomycin (VANCOCIN) IVPB 1000 mg/200 mL premix     1,000 mg 100 mL/hr over 120 Minutes Intravenous Every 24 hours 05/14/15 2251     05/15/15 0400  piperacillin-tazobactam (ZOSYN) IVPB 3.375 g     3.375 g 12.5 mL/hr over 240 Minutes Intravenous 3 times per day 05/14/15 2250     05/14/15 2045  ampicillin-sulbactam (UNASYN) 1.5 g in sodium chloride 0.9 % 50 mL IVPB  Status:  Discontinued     1.5 g 100 mL/hr over 30 Minutes Intravenous Every 6 hours 05/14/15 2038 05/14/15 2245   05/14/15 2030  vancomycin (VANCOCIN) IVPB 1000 mg/200 mL premix     1,000 mg 200 mL/hr over 60 Minutes Intravenous  Once 05/14/15 2022 05/14/15 2136      DVT Prophylaxis: Prophylactic Lovenox   Code Status: Full code   Family Communication [employer] bedside  Procedures: None  CONSULTS:  general surgery  Time spent 30 minutes-Greater than 50% of this time was spent in counseling, explanation of diagnosis, planning of further management, and coordination of care.  MEDICATIONS: Scheduled Meds: . amLODipine  5 mg Oral Daily  . collagenase   Topical Daily  . cyanocobalamin  500 mcg Oral Daily  . donepezil  10 mg Oral QHS  . enoxaparin (LOVENOX) injection  40 mg  Subcutaneous Q24H  . feeding supplement  1 Container Oral TID BM  . feeding supplement (PRO-STAT SUGAR FREE 64)  30 mL Oral TID WC  . megestrol  200 mg Oral BID  . metoprolol succinate  25 mg Oral Daily  . multivitamin with minerals  1 tablet Oral Daily  . piperacillin-tazobactam (ZOSYN)  IV  3.375 g Intravenous 3 times per day  . vancomycin  1,000 mg Intravenous Q24H  . vitamin C  500 mg Oral BID   Continuous Infusions:  PRN Meds:.    PHYSICAL EXAM: Vital signs in last 24 hours: Filed Vitals:   05/14/15 2344 05/15/15 0418 05/15/15 2108 05/16/15 0507  BP: 129/64 129/72 142/99 137/94  Pulse: 82 86 85 95  Temp: 98.2 F (36.8 C) 97.7 F (36.5 C) 98.1 F (36.7 C) 99 F (37.2 C)  TempSrc: Axillary Axillary Axillary Axillary  Resp: 16 121 17 16  Height:  (1.702 m)     Weight: 62.4 kg (137 lb 9.1 oz)     SpO2: 99% 100% 100% 100%    Weight change:  Filed Weights   05/14/15 1910 05/14/15 2344  Weight: 54.432 kg (120 lb) 62.4 kg (137 lb 9.1 oz)   Body mass index is 21.54 kg/(m^2).   Gen Exam: Sleeping-but awake-and answers most of my questions appropriately. Slow but clear speech.   Neck: Supple, No JVD.   Chest: B/L Clear.   CVS: S1 S2 Regular, no murmurs.  Abdomen: soft, BS +, non tender, non distended. Colostomy in place-brown stools in colostomy  Extremities: no edema, lower extremities warm to touch. Neurologic: Non Focal-but with generalized weakness  Skin: No Rash.   Wounds: N/A.  Intake/Output from previous day:  Intake/Output Summary (Last 24 hours) at 05/16/15 1536 Last data filed at 05/16/15 1208  Gross per 24 hour  Intake   1142 ml  Output      0 ml  Net   1142 ml     LAB RESULTS: CBC  Recent Labs Lab 05/14/15 1941 05/16/15 0749  WBC 25.6* 14.9*  HGB 13.3 12.2  HCT 41.2 37.2  PLT 386 373  MCV 85.7 84.9  MCH 27.7 27.9  MCHC 32.3 32.8  RDW 16.3* 16.0*  LYMPHSABS 1.8  --   MONOABS 1.5*  --   EOSABS 1.0*  --   BASOSABS 0.0  --      Chemistries   Recent Labs Lab 05/14/15 1941 05/16/15 0749  NA 145 141  K 3.7 4.1  CL 106 108  CO2 23 21*  GLUCOSE 92 148*  BUN 19 11  CREATININE 0.57 0.67  CALCIUM 9.6 8.7*    CBG: No results for input(s): GLUCAP in the last 168 hours.  GFR Estimated Creatinine Clearance: 56.4 mL/min (by C-G formula based on Cr of 0.67).  Coagulation profile No results for input(s): INR, PROTIME in the last 168 hours.  Cardiac Enzymes No results for input(s): CKMB, TROPONINI, MYOGLOBIN in the last 168 hours.  Invalid input(s): CK  Invalid input(s): POCBNP No results for input(s): DDIMER in the last 72 hours. No results for input(s): HGBA1C in the last 72 hours. No results for input(s): CHOL, HDL, LDLCALC, TRIG, CHOLHDL, LDLDIRECT in the last 72 hours. No results for input(s): TSH, T4TOTAL, T3FREE, THYROIDAB in the last 72 hours.  Invalid input(s): FREET3 No results for input(s): VITAMINB12, FOLATE, FERRITIN, TIBC, IRON, RETICCTPCT in the last 72 hours. No results for input(s): LIPASE, AMYLASE in the last 72 hours.  Urine Studies No  results for input(s): UHGB, CRYS in the last 72 hours.  Invalid input(s): UACOL, UAPR, USPG, UPH, UTP, UGL, UKET, UBIL, UNIT, UROB, ULEU, UEPI, UWBC, URBC, UBAC, CAST, UCOM, BILUA  MICROBIOLOGY: Recent Results (from the past 240 hour(s))  Culture, blood (routine x 2)     Status: None (Preliminary result)   Collection Time: 05/14/15  7:41 PM  Result Value Ref Range Status   Specimen Description BLOOD RIGHT HAND  Final   Special Requests IN PEDIATRIC BOTTLE 1CC  Final   Culture NO GROWTH 2 DAYS  Final   Report Status PENDING  Incomplete    RADIOLOGY STUDIES/RESULTS: Mr Pelvis W Wo Contrast  04/30/2015  CLINICAL DATA:  Stage IV sacral decubitus ulcer with fever and elevated white blood count. Osteomyelitis of the sacrum. EXAM: MRI PELVIS WITHOUT AND WITH CONTRAST TECHNIQUE: Multiplanar multisequence MR imaging of the pelvis was performed both  before and after administration of intravenous contrast. CONTRAST:  10mL MULTIHANCE GADOBENATE DIMEGLUMINE 529 MG/ML IV SOLN COMPARISON:  MRI dated 11/15/2014 and CT scan dated 11/15/2014 FINDINGS: There is a midline sacral decubitus ulcer. The ulcer extends just superficial to the distal sacrum. There is either been resection or destruction of the fourth and fifth sacral segments and a partial resection of the proximal coccyx since the prior exam. There is new enhancement of the gluteus maximus muscles adjacent to tear sacral attachments and just medial and lateral to the sacral decubitus ulcer, consistent with myositis. There is no definable abscess or current osteomyelitis. Ostomy is noted in the left lower quadrant. There is no adenopathy or mass lesion. There is atrophy of the muscles of the hips in both proximal thighs. Right hip prosthesis noted. IMPRESSION: 1. No evidence of current osteomyelitis or soft tissue abscess. 2. Deep sacral decubitus ulcer with adjacent focal myositis of the gluteus maximus muscles. I Electronically Signed   By: Francene BoyersJames  Maxwell M.D.   On: 04/30/2015 10:16   Dg Chest Port 1 View  05/15/2015  CLINICAL DATA:  Shortness of breath. EXAM: PORTABLE CHEST 1 VIEW COMPARISON:  04/29/2015 FINDINGS: Patient is rotated towards the right. The aorta is tortuous and mildly calcified. Heart size is accentuated by the portable technique. The lungs are free of focal consolidations and pleural effusions. Mild mid thoracic spondylosis noted. IMPRESSION: No evidence for acute cardiopulmonary abnormality. Electronically Signed   By: Norva PavlovElizabeth  Brown M.D.   On: 05/15/2015 08:50   Dg Chest Port 1 View  04/29/2015  CLINICAL DATA:  81 year old female with fever and wound infection EXAM: PORTABLE CHEST 1 VIEW COMPARISON:  Prior chest x-ray 11/24/2014 FINDINGS: Low inspiratory volumes with mild bibasilar atelectasis. Stable cardiac and mediastinal contours. Atherosclerotic calcifications throughout the  thoracic aorta. Stable mild chronic bronchitic change. No pulmonary edema, focal consolidation or pneumothorax. No acute osseous abnormality. IMPRESSION: 1. Low inspiratory volumes with mild bibasilar atelectasis. 2. Otherwise, stable chest x-ray without acute cardiopulmonary process. Electronically Signed   By: Malachy MoanHeath  McCullough M.D.   On: 04/29/2015 16:44    Jeoffrey MassedGHIMIRE,Desire Fulp, MD  [facility] Pager:[phone]  If 7PM-7AM, please contact night-coverage [email or web address] Password [email or web address] 05/16/2015, 3:36 PM   LOS: 2 days

## 2015-05-16 NOTE — Consult Note (Signed)
Consultation Note Date: 05/16/2015   Patient Name: [name] Frank  DOB: [birth year]  MRN: [id number]  Age / Sex: 81 y.o., female  PCP: Eulas Post, MD Referring Physician: Jonetta Osgood, MD  Reason for Consultation: Establishing goals of care    Clinical Assessment/Narrative: I met today with Ms. Latini (she is very confused - this is baseline). I spoke more with her husband, Carloyn Manner, and oldest son, Thayer Jew. We discussed her decline and the fact that she is likely reaching EOL. They understand that she has been dealing with this sacral wound and that she is not strong enough to continue overcoming infection. Explained failure to thrive. They express desire for her to be comfortable and have good care. Their main concern is they are very unhappy with her current SNF and attribute her decline to poor care at [facility]. They do understand that she would continue to decline even with the best of care at this point. They agree to new SNF and have been working with [facility]. They have also decided DNR and agree to hospice to assist with her care at SNF to help with QOL and comfort. Their desire is to minimize her suffering. Emotional support and therapeutic listening provided.   Contacts/Participants in Discussion: Primary Decision Maker: Jonelle Sports   Relationship to Patient  husband He also involved other family members to assist with decisions as his "memory is not the best."  SUMMARY OF RECOMMENDATIONS - DNR - Continue antibiotics and hydrotherapy as indicated - Agree for hospice to follow at SNF  Code Status/Advance Care Planning: DNR    Code Status Orders        Start     Ordered   05/16/15 1422  Do not attempt resuscitation (DNR)   Continuous    Question Answer Comment  In the event of cardiac or respiratory ARREST Do not call a "code blue"   In the event of cardiac or respiratory ARREST Do not perform  Intubation, CPR, defibrillation or ACLS   In the event of cardiac or respiratory ARREST Use medication by any route, position, wound care, and other measures to relive pain and suffering. May use oxygen, suction and manual treatment of airway obstruction as needed for comfort.      05/16/15 1421    Code Status History    Date Active Date Inactive Code Status Order ID Comments User Context   04/29/2015 10:25 PM 05/02/2015 11:45 PM Full Code [id number]  Etta Quill, DO ED   11/14/2014  5:37 PM 11/26/2014 10:38 PM Full Code [id number]  Kelvin Cellar, MD Inpatient      Symptom Management:   Denies pain/discomfort.  Malnutrition: Husband says she eats well. Continue supplementation. Feeding tube not indicated with dementia.   High risk for delirium.   Palliative Prophylaxis:   Bowel Regimen, Delirium Protocol, Frequent Pain Assessment, Oral Care, Palliative Wound Care and Turn Reposition  Additional Recommendations (Limitations, Scope, Preferences):  Avoid Hospitalization and Minimize Medications  Psycho-social/Spiritual:  Support System: Adequate Desire for further Chaplaincy support:no Additional Recommendations: Caregiving  Support/Resources and Education on Hospice  Prognosis: < 6 months  Discharge Planning: [place] with Hospice   Chief Complaint/ Primary Diagnoses: Present on Admission:  . Sacral decubitus ulcer  I have reviewed the medical record, interviewed the patient and family, and examined the patient. The following aspects are pertinent.  Past Medical History  Diagnosis Date  . Allergy   . Hyperlipidemia   . Hypertension   . Anxiety   .  GERD (gastroesophageal reflux disease)   . Urinary frequency   . Neurogenic urinary incontinence     since back surgery  . Arthritis     degenerative oateoarthritis  . Decubital ulcer 11/14/2014  . Dementia    Social History   Social History  . Marital Status: Married    Spouse Name: N/A  . Number  of Children: N/A  . Years of Education: N/A   Social History Main Topics  . Smoking status: Former Smoker -- 0.50 packs/day for 10 years    Types: Cigarettes    Quit date: 09/23/1983  . Smokeless tobacco: Never Used  . Alcohol Use: No  . Drug Use: No  . Sexual Activity: Not Asked   Other Topics Concern  . None   Social History Narrative   Family History  Problem Relation Age of Onset  . Diabetes Mother   . Alcohol abuse      fhx  . Arthritis      fhx  . Hypertension      fhx  . Heart disease      fhx  . Cancer      colon, lung, prostate/fhx  . Stroke      fhx  . Depression      fhx   Scheduled Meds: . amLODipine  5 mg Oral Daily  . collagenase   Topical Daily  . cyanocobalamin  500 mcg Oral Daily  . donepezil  10 mg Oral QHS  . enoxaparin (LOVENOX) injection  40 mg Subcutaneous Q24H  . feeding supplement  1 Container Oral TID BM  . feeding supplement (PRO-STAT SUGAR FREE 64)  30 mL Oral TID WC  . megestrol  200 mg Oral BID  . metoprolol succinate  25 mg Oral Daily  . multivitamin with minerals  1 tablet Oral Daily  . piperacillin-tazobactam (ZOSYN)  IV  3.375 g Intravenous 3 times per day  . vancomycin  1,000 mg Intravenous Q24H  . vitamin C  500 mg Oral BID   Continuous Infusions:  PRN Meds:. Medications Prior to Admission:  Prior to Admission medications   Medication Sig Start Date End Date Taking? Authorizing Provider  Amino Acids-Protein Hydrolys (FEEDING SUPPLEMENT, PRO-STAT SUGAR FREE 64,) LIQD Take 30 mLs by mouth 3 (three) times daily with meals.   Yes Historical Provider, MD  amLODipine (NORVASC) 5 MG tablet Take 5 mg by mouth daily.   Yes Historical Provider, MD  donepezil (ARICEPT) 10 MG tablet Take 1 tablet (10 mg total) by mouth at bedtime. 07/19/14  Yes Eulas Post, MD  feeding supplement (BOOST / RESOURCE BREEZE) LIQD Take 1 Container by mouth 3 (three) times daily between meals. 11/26/14  Yes Janece Canterbury, MD  furosemide (LASIX) 20 MG  tablet TAKE 1 TABLET (20 MG TOTAL) BY MOUTH DAILY. 10/02/14  Yes Eulas Post, MD  losartan (COZAAR) 50 MG tablet TAKE 1 TABLET BY MOUTH DAILY Patient taking differently: TAKE 50 MG BY MOUTH DAILY 01/14/14  Yes Eulas Post, MD  megestrol (MEGACE ORAL) 40 MG/ML suspension One tsp po bid Patient taking differently: Take 200 mg by mouth 2 (two) times daily.  06/19/14  Yes Eulas Post, MD  metoprolol succinate (TOPROL-XL) 25 MG 24 hr tablet Take 1 tablet (25 mg total) by mouth daily. 04/14/15  Yes Eulas Post, MD  Multiple Vitamins-Minerals (CERTAGEN) tablet Take 1 tablet by mouth daily.   Yes Historical Provider, MD  vitamin B-12 (CYANOCOBALAMIN) 500 MCG tablet Take 500 mcg by  mouth daily.   Yes Historical Provider, MD  vitamin C (VITAMIN C) 500 MG tablet Take 1 tablet (500 mg total) by mouth 2 (two) times daily. 11/26/14  Yes Janece Canterbury, MD   Allergies  Allergen Reactions  . Augmentin [Amoxicillin-Pot Clavulanate] Other (See Comments)    Per MAR  . Codeine Sulfate Other (See Comments)    stomach pains  . Tramadol Hcl Other (See Comments)    stomach cramps    Review of Systems  Unable to perform ROS   Physical Exam  Constitutional: She appears well-developed. She appears cachectic.  Cardiovascular: Normal rate.   Respiratory: Effort normal. No accessory muscle usage. No tachypnea. No respiratory distress.  GI: Normal appearance.  Neurological: She is alert. She is disoriented.    Vital Signs: BP 137/94 mmHg  Pulse 95  Temp(Src) 99 F (37.2 C) (Axillary)  Resp 16  Ht _0  (1.702 m)  Wt 62.4 kg (137 lb 9.1 oz)  BMI 21.54 kg/m2  SpO2 100%  SpO2: SpO2: 100 % O2 Device:SpO2: 100 % O2 Flow Rate: .   IO: Intake/output summary:  Intake/Output Summary (Last 24 hours) at 05/16/15 1426 Last data filed at 05/16/15 1208  Gross per 24 hour  Intake   1142 ml  Output      0 ml  Net   1142 ml    LBM:   Baseline Weight: Weight: 54.432 kg (120 lb) Most recent  weight: Weight: 62.4 kg (137 lb 9.1 oz)      Palliative Assessment/Data:  Flowsheet Rows        Most Recent Value   Intake Tab    Referral Department  Hospitalist   Unit at Time of Referral  Med/Surg Unit   Palliative Care Primary Diagnosis  Sepsis/Infectious Disease   Date Notified  05/15/15   Palliative Care Type  Return patient Palliative Care   Reason for referral  Clarify Goals of Care   Date of Admission  05/14/15   # of days IP prior to Palliative referral  1   Clinical Assessment    Psychosocial & Spiritual Assessment    Palliative Care Outcomes       Additional Data Reviewed:  CBC:    Component Value Date/Time   WBC 14.9* 05/16/2015 0749   HGB 12.2 05/16/2015 0749   HCT 37.2 05/16/2015 0749   PLT 373 05/16/2015 0749   MCV 84.9 05/16/2015 0749   NEUTROABS 21.3* 05/14/2015 1941   LYMPHSABS 1.8 05/14/2015 1941   MONOABS 1.5* 05/14/2015 1941   EOSABS 1.0* 05/14/2015 1941   BASOSABS 0.0 05/14/2015 1941   Comprehensive Metabolic Panel:    Component Value Date/Time   NA 141 05/16/2015 0749   K 4.1 05/16/2015 0749   CL 108 05/16/2015 0749   CO2 21* 05/16/2015 0749   BUN 11 05/16/2015 0749   CREATININE 0.67 05/16/2015 0749   CREATININE 0.70 01/13/2015 1451   GLUCOSE 148* 05/16/2015 0749   CALCIUM 8.7* 05/16/2015 0749   AST 22 05/14/2015 1941   ALT 15 05/14/2015 1941   ALKPHOS 66 05/14/2015 1941   BILITOT 0.4 05/14/2015 1941   PROT 6.9 05/14/2015 1941   ALBUMIN 2.7* 05/14/2015 1941     Time In: 1310 Time Out: 1430 Time Total: 32mn Greater than 50%  of this time was spent counseling and coordinating care related to the above assessment and plan.  Signed by: PPershing Proud NP  APershing Proud NP  34/05/1538 2:26 PM  Please contact Palliative Medicine Team  phone at [phone] for questions and concerns.

## 2015-05-17 DIAGNOSIS — E440 Moderate protein-calorie malnutrition: Secondary | ICD-10-CM

## 2015-05-17 DIAGNOSIS — T148 Other injury of unspecified body region: Secondary | ICD-10-CM

## 2015-05-17 DIAGNOSIS — R627 Adult failure to thrive: Secondary | ICD-10-CM

## 2015-05-17 DIAGNOSIS — L89154 Pressure ulcer of sacral region, stage 4: Secondary | ICD-10-CM

## 2015-05-17 DIAGNOSIS — L089 Local infection of the skin and subcutaneous tissue, unspecified: Secondary | ICD-10-CM

## 2015-05-17 LAB — BASIC METABOLIC PANEL
Anion gap: 9 (ref 5–15)
BUN: 14 mg/dL (ref 6–20)
CHLORIDE: 109 mmol/L (ref 101–111)
CO2: 27 mmol/L (ref 22–32)
CREATININE: 0.73 mg/dL (ref 0.44–1.00)
Calcium: 8.4 mg/dL — ABNORMAL LOW (ref 8.9–10.3)
GFR calc Af Amer: >60 mL/min (ref >60)
GFR calc non Af Amer: >60 mL/min (ref >60)
GLUCOSE: 153 mg/dL — AB (ref 65–99)
POTASSIUM: 3 mmol/L — AB (ref 3.5–5.1)
Sodium: 145 mmol/L (ref 135–145)

## 2015-05-17 LAB — CBC
HCT: 35.7 % — ABNORMAL LOW (ref 36.0–46.0)
HEMOGLOBIN: 11.7 g/dL — AB (ref 12.0–15.0)
MCH: 27.7 pg (ref 26.0–34.0)
MCHC: 32.8 g/dL (ref 30.0–36.0)
MCV: 84.6 fL (ref 78.0–100.0)
PLATELETS: 395 10*3/uL (ref 150–400)
RBC: 4.22 MIL/uL (ref 3.87–5.11)
RDW: 16.2 % — ABNORMAL HIGH (ref 11.5–15.5)
WBC: 16.6 10*3/uL — ABNORMAL HIGH (ref 4.0–10.5)

## 2015-05-17 MED ORDER — DIPHENHYDRAMINE HCL 50 MG/ML IJ SOLN
12.5000 mg | Freq: Once | INTRAMUSCULAR | Status: AC
  Administered 2015-05-17: 12.5 mg via INTRAVENOUS
  Filled 2015-05-17: qty 1

## 2015-05-17 NOTE — Progress Notes (Signed)
PATIENT DETAILS Name: [name] Frank Age: 81 y.o. Sex: female Date of Birth: 26-Oct-1934 Admit Date: 05/14/2015 Admitting Physician Eston Esters, MD ZOX:[id number] W, MD  Brief narrative: 81 year old female with history of dementia, known stage IV sacral decubitus requiring a diverting colostomy with prior history of pelvic osteomyelitis-just discharged on 05/02/15-readmitted on 3/1 for sepsis secondary to infected sacral decubitus ulcer. Gen. surgery consulted, placed on broad-spectrum antibiotics. Slowly improving, given overall poor health and failure to thrive syndrome, palliative care consulted. See below for further details.  Subjective:  Sitting up in bed, no fever chills, denies any headache chest or abdominal pain, appears to be in no distress, no shortness of breath.  Assessment/Plan:   Sepsis: Secondary to infected stage IV decubitus ulcer. Chest x-ray/UA negative for infection. Clinically improving with decreasing leukocytosis-Continue empiric vancomycin and Zosyn. Blood culture negative so far.  Infected Sacral decubitus ulcer: Has a history of chronic sacral stage IV decubitus ulcer, also has a prior history of pelvic osteomyelitis. However her most recent MRI on 04/30/15 was negative for osteomyelitis. Gen. surgery consulted, recommendations are for hydrotherapy and wound care. Continue empiric antibiotics-spoke with husband at bedside, explained poor overall prognoses, likely he will have recurrent issues with one infection. Await further recommendations from general surgery.   Hypertension: Appears controlled, continue amlodipine and metoprolol.  Failure to thrive syndrome: Continue supplements, continue care/PT OT evaluation. Poor overall prognosis-family aware.   Dementia:  Pleasantly confused-but answers most of my questions appropriately. Resume Aricept.  Moderate protein calorie malnutrition: Continue supplements.  Palliative care:  Elderly  frail patient with dementia, chronic sacral decubitus ulcer requiring a diverting colostomy with recent admission for infected sacral decubitus, readmitted on 05/14/15 for a infected sacral decubitus ulcer causing sepsis. Fortunately appears to have deteriorated functionally over the past few months-now essentially bedbound, with recurrent purulent infection of stage IV sacral decubitus. Unfortunately, poor overall long-term prognoses. Palliative care consultation obtained, recommendations are to continue with wound care, antibiotics, and have hospice/palliative care follow-up while at SNF. Made DO NOT RESUSCITATE by palliative care.   Disposition: Remain inpatient  Antimicrobial agents  See below  Anti-infectives    Start     Dose/Rate Route Frequency Ordered Stop   05/15/15 2000  vancomycin (VANCOCIN) IVPB 1000 mg/200 mL premix     1,000 mg 100 mL/hr over 120 Minutes Intravenous Every 24 hours 05/14/15 2251     05/15/15 0400  piperacillin-tazobactam (ZOSYN) IVPB 3.375 g     3.375 g 12.5 mL/hr over 240 Minutes Intravenous 3 times per day 05/14/15 2250     05/14/15 2045  ampicillin-sulbactam (UNASYN) 1.5 g in sodium chloride 0.9 % 50 mL IVPB  Status:  Discontinued     1.5 g 100 mL/hr over 30 Minutes Intravenous Every 6 hours 05/14/15 2038 05/14/15 2245   05/14/15 2030  vancomycin (VANCOCIN) IVPB 1000 mg/200 mL premix     1,000 mg 200 mL/hr over 60 Minutes Intravenous  Once 05/14/15 2022 05/14/15 2136      DVT Prophylaxis: Prophylactic Lovenox   Code Status: Full code   Family Communication Percival Spanish bedside by previous M.D.  Procedures: None  CONSULTS:  general surgery  Time spent 30 minutes-Greater than 50% of this time was spent in counseling, explanation of diagnosis, planning of further management, and coordination of care.  MEDICATIONS: Scheduled Meds: . amLODipine  5 mg Oral Daily  . collagenase   Topical Daily  .  cyanocobalamin  500 mcg Oral Daily  .  donepezil  10 mg Oral QHS  . enoxaparin (LOVENOX) injection  40 mg Subcutaneous Q24H  . feeding supplement  1 Container Oral TID BM  . feeding supplement (PRO-STAT SUGAR FREE 64)  30 mL Oral TID WC  . megestrol  200 mg Oral BID  . metoprolol succinate  25 mg Oral Daily  . multivitamin with minerals  1 tablet Oral Daily  . piperacillin-tazobactam (ZOSYN)  IV  3.375 g Intravenous 3 times per day  . vancomycin  1,000 mg Intravenous Q24H  . vitamin C  500 mg Oral BID   Continuous Infusions:  PRN Meds:.    PHYSICAL EXAM: Vital signs in last 24 hours: Filed Vitals:   05/15/15 2108 05/16/15 0507 05/16/15 2107 05/17/15 0718  BP: 142/99 137/94 139/69 139/70  Pulse: 85 95 100 73  Temp: 98.1 F (36.7 C) 99 F (37.2 C) 98.1 F (36.7 C) 98.6 F (37 C)  TempSrc: Axillary Axillary Axillary Axillary  Resp: 17 16 16 15   Height:      Weight:      SpO2: 100% 100% 100% 100%    Weight change:  Filed Weights   05/14/15 1910 05/14/15 2344  Weight: 54.432 kg (120 lb) 62.4 kg (137 lb 9.1 oz)   Body mass index is 21.54 kg/(m^2).   Gen Exam: Sleeping-but awake-and answers most of my questions appropriately. Slow but clear speech.   Neck: Supple, No JVD.   Chest: B/L Clear.   CVS: S1 S2 Regular, no murmurs.  Abdomen: soft, BS +, non tender, non distended. Colostomy in place-brown stools in colostomy  Extremities: no edema, lower extremities warm to touch. Neurologic: Non Focal-but with generalized weakness  Skin: No Rash.   Wounds: N/A.  Intake/Output from previous day:  Intake/Output Summary (Last 24 hours) at 05/17/15 1121 Last data filed at 05/17/15 0908  Gross per 24 hour  Intake   1160 ml  Output      0 ml  Net   1160 ml     LAB RESULTS: CBC  Recent Labs Lab 05/14/15 1941 05/16/15 0749  WBC 25.6* 14.9*  HGB 13.3 12.2  HCT 41.2 37.2  PLT 386 373  MCV 85.7 84.9  MCH 27.7 27.9  MCHC 32.3 32.8  RDW 16.3* 16.0*  LYMPHSABS 1.8  --   MONOABS 1.5*  --   EOSABS 1.0*  --    BASOSABS 0.0  --     Chemistries   Recent Labs Lab 05/14/15 1941 05/16/15 0749  NA 145 141  K 3.7 4.1  CL 106 108  CO2 23 21*  GLUCOSE 92 148*  BUN 19 11  CREATININE 0.57 0.67  CALCIUM 9.6 8.7*    CBG: No results for input(s): GLUCAP in the last 168 hours.  GFR Estimated Creatinine Clearance: 56.4 mL/min (by C-G formula based on Cr of 0.67).  Coagulation profile No results for input(s): INR, PROTIME in the last 168 hours.  Cardiac Enzymes No results for input(s): CKMB, TROPONINI, MYOGLOBIN in the last 168 hours.  Invalid input(s): CK  Invalid input(s): POCBNP No results for input(s): DDIMER in the last 72 hours. No results for input(s): HGBA1C in the last 72 hours. No results for input(s): CHOL, HDL, LDLCALC, TRIG, CHOLHDL, LDLDIRECT in the last 72 hours. No results for input(s): TSH, T4TOTAL, T3FREE, THYROIDAB in the last 72 hours.  Invalid input(s): FREET3 No results for input(s): VITAMINB12, FOLATE, FERRITIN, TIBC, IRON, RETICCTPCT in the last 72 hours.  No results for input(s): LIPASE, AMYLASE in the last 72 hours.  Urine Studies No results for input(s): UHGB, CRYS in the last 72 hours.  Invalid input(s): UACOL, UAPR, USPG, UPH, UTP, UGL, UKET, UBIL, UNIT, UROB, ULEU, UEPI, UWBC, URBC, UBAC, CAST, UCOM, BILUA  MICROBIOLOGY: Recent Results (from the past 240 hour(s))  Culture, blood (routine x 2)     Status: None (Preliminary result)   Collection Time: 05/14/15  7:41 PM  Result Value Ref Range Status   Specimen Description BLOOD RIGHT HAND  Final   Special Requests IN PEDIATRIC BOTTLE 1CC  Final   Culture NO GROWTH 2 DAYS  Final   Report Status PENDING  Incomplete    RADIOLOGY STUDIES/RESULTS: Mr Pelvis W Wo Contrast  04/30/2015  CLINICAL DATA:  Stage IV sacral decubitus ulcer with fever and elevated white blood count. Osteomyelitis of the sacrum. EXAM: MRI PELVIS WITHOUT AND WITH CONTRAST TECHNIQUE: Multiplanar multisequence MR imaging of the pelvis  was performed both before and after administration of intravenous contrast. CONTRAST:  10mL MULTIHANCE GADOBENATE DIMEGLUMINE 529 MG/ML IV SOLN COMPARISON:  MRI dated 11/15/2014 and CT scan dated 11/15/2014 FINDINGS: There is a midline sacral decubitus ulcer. The ulcer extends just superficial to the distal sacrum. There is either been resection or destruction of the fourth and fifth sacral segments and a partial resection of the proximal coccyx since the prior exam. There is new enhancement of the gluteus maximus muscles adjacent to tear sacral attachments and just medial and lateral to the sacral decubitus ulcer, consistent with myositis. There is no definable abscess or current osteomyelitis. Ostomy is noted in the left lower quadrant. There is no adenopathy or mass lesion. There is atrophy of the muscles of the hips in both proximal thighs. Right hip prosthesis noted. IMPRESSION: 1. No evidence of current osteomyelitis or soft tissue abscess. 2. Deep sacral decubitus ulcer with adjacent focal myositis of the gluteus maximus muscles. I Electronically Signed   By: Francene Boyers M.D.   On: 04/30/2015 10:16   Dg Chest Port 1 View  05/15/2015  CLINICAL DATA:  Shortness of breath. EXAM: PORTABLE CHEST 1 VIEW COMPARISON:  04/29/2015 FINDINGS: Patient is rotated towards the right. The aorta is tortuous and mildly calcified. Heart size is accentuated by the portable technique. The lungs are free of focal consolidations and pleural effusions. Mild mid thoracic spondylosis noted. IMPRESSION: No evidence for acute cardiopulmonary abnormality. Electronically Signed   By: Norva Pavlov M.D.   On: 05/15/2015 08:50   Dg Chest Port 1 View  04/29/2015  CLINICAL DATA:  81 year old female with fever and wound infection EXAM: PORTABLE CHEST 1 VIEW COMPARISON:  Prior chest x-ray 11/24/2014 FINDINGS: Low inspiratory volumes with mild bibasilar atelectasis. Stable cardiac and mediastinal contours. Atherosclerotic  calcifications throughout the thoracic aorta. Stable mild chronic bronchitic change. No pulmonary edema, focal consolidation or pneumothorax. No acute osseous abnormality. IMPRESSION: 1. Low inspiratory volumes with mild bibasilar atelectasis. 2. Otherwise, stable chest x-ray without acute cardiopulmonary process. Electronically Signed   By: Malachy Moan M.D.   On: 04/29/2015 16:44    Leroy Sea, MD  [facility] Pager:[phone]  If 7PM-7AM, please contact night-coverage [email or web address] Password [email or web address] 05/17/2015, 11:21 AM   LOS: 3 days

## 2015-05-17 NOTE — Progress Notes (Signed)
Physical Therapy Wound Treatment Patient Details  Name: SAVANA SPINA MRN: [id number] Date of Birth: 1934-06-14  Today's Date: 05/17/2015 Time: 1000-1021 Time Calculation (min): 21 min  Subjective  Subjective: I'm okay. Patient and Family Stated Goals: Pt didn't state Date of Onset:  (Prior to admission) Prior Treatments: Dressing changes, VAC  Pain Score: "I'm okay" no numerical value given. No apparent distress.  Wound Assessment  Wound / Incision (Open or Dehisced) 04/30/15 Other (Comment) Sacrum Medial Stage IV approximate quarter size (Active)  Dressing Type Foam;Barrier Film (skin prep);Moist to dry;Gauze (Comment) 05/17/2015 10:00 AM  Dressing Changed Changed 05/17/2015 10:00 AM  Dressing Status Clean;Dry;Intact 05/17/2015 10:00 AM  Dressing Change Frequency Daily 05/17/2015 10:00 AM  Site / Wound Assessment Red;Yellow;Black;Brown 05/17/2015 10:00 AM  % Wound base Red or Granulating 35% 05/17/2015 10:00 AM  % Wound base Yellow 20% 05/17/2015 10:00 AM  % Wound base Black 45% 05/17/2015 10:00 AM  % Wound base Other (Comment) 0% 05/17/2015 10:00 AM  Peri-wound Assessment Denuded;Maceration 05/17/2015 10:00 AM  Wound Length (cm) 5 cm 05/16/2015  8:52 AM  Wound Width (cm) 7.5 cm 05/16/2015  8:52 AM  Wound Depth (cm) 2.7 cm 05/16/2015  8:52 AM  Undermining (cm) 12 to 3 o'clock up to 3cm 05/16/2015  8:52 AM  Margins Unattached edges (unapproximated) 05/17/2015 10:00 AM  Closure None 05/17/2015 10:00 AM  Drainage Amount Minimal 05/17/2015 10:00 AM  Drainage Description Serosanguineous 05/17/2015 10:00 AM  Non-staged Wound Description Full thickness 05/17/2015 10:00 AM  Treatment Debridement (Selective);Hydrotherapy (Pulse lavage);Packing (Saline gauze);Other (Comment) 05/17/2015 10:00 AM  Santyl applied to wound bed prior to applying dressing.   Hydrotherapy Pulsed lavage therapy - wound location: sacrum Pulsed Lavage with Suction (psi): 8 psi Pulsed Lavage with Suction - Normal Saline Used: 1000 mL Pulsed  Lavage Tip: Tip with splash shield Selective Debridement Selective Debridement - Location: sacrum Selective Debridement - Tools Used: Forceps;Scissors Selective Debridement - Tissue Removed: Yellow and black necrotic tissue   Wound Assessment and Plan  Wound Therapy - Assess/Plan/Recommendations Wound Therapy - Clinical Statement: With some granulatous growth, able to remove non-viable tissue from within wound. Macerated perimeter. Foam dressing applied. Wound Therapy - Functional Problem List: Decr time out of bed Factors Delaying/Impairing Wound Healing: Immobility;Polypharmacy;Multiple medical problems Hydrotherapy Plan: Debridement;Dressing change;Patient/family education;Pulsatile lavage with suction Wound Therapy - Frequency: 6X / week Wound Therapy - Follow Up Recommendations: Skilled nursing facility Wound Plan: See above  Wound Therapy Goals- Improve the function of patient's integumentary system by progressing the wound(s) through the phases of wound healing (inflammation - proliferation - remodeling) by: Decrease Necrotic Tissue to: 30% Decrease Necrotic Tissue - Progress: Progressing toward goal Increase Granulation Tissue to: 70% Increase Granulation Tissue - Progress: Progressing toward goal  Goals will be updated until maximal potential achieved or discharge criteria met.  Discharge criteria: when goals achieved, discharge from hospital, MD decision/surgical intervention, no progress towards goals, refusal/missing three consecutive treatments without notification or medical reason.  GP     Candie Mile S 05/17/2015, 10:33 AM  Elayne Snare, [place]

## 2015-05-17 NOTE — Progress Notes (Signed)
Pharmacy Antibiotic Note  [name] Frank is a 81 y.o. female admitted on 05/14/2015 with sacral decubitus ulcer infection.  Pharmacy has been consulted for Zosyn and vancomycin dosing. Afebrile, WBC elevated. SCr stable, CrCl ~50-7555ml/min. Today is day 4 antibiotics.  Plan: Continue Zosyn 3.375 gm IV q8h (4 hour infusion) Continue vancomycin 1g IV q24h - will consider VT soon if not d/c'd Monitor clinical picture, renal function, VT prn F/U C&S, abx deescalation / LOT   Height: 5\' 7"  (170.2 cm) Weight: 137 lb 9.1 oz (62.4 kg) IBW/kg (Calculated) : 61.6  Temp (24hrs), Avg:98.4 F (36.9 C), Min:98.1 F (36.7 C), Max:98.6 F (37 C)   Recent Labs Lab 05/14/15 1941 05/14/15 1955 05/16/15 0749 05/17/15 1127  WBC 25.6*  --  14.9* 16.6*  CREATININE 0.57  --  0.67 0.73  LATICACIDVEN  --  2.71*  --   --     Estimated Creatinine Clearance: 56.4 mL/min (by C-G formula based on Cr of 0.73).    Allergies  Allergen Reactions  . Augmentin [Amoxicillin-Pot Clavulanate] Other (See Comments)    Per MAR  . Codeine Sulfate Other (See Comments)    stomach pains  . Tramadol Hcl Other (See Comments)    stomach cramps    Thank you for allowing pharmacy to be a part of this patient's care.  [place] JeffersonJennifer [place], 1700 Rainbow BoulevardPharm.D., BCPS Clinical Pharmacist Pager: [phone] 05/17/2015 1:42 PM

## 2015-05-18 MED ORDER — POTASSIUM CHLORIDE 10 MEQ/100ML IV SOLN
10.0000 meq | INTRAVENOUS | Status: AC
  Administered 2015-05-18 (×4): 10 meq via INTRAVENOUS
  Filled 2015-05-18: qty 100

## 2015-05-18 MED ORDER — POTASSIUM CHLORIDE CRYS ER 20 MEQ PO TBCR: 40.0000 meq | EXTENDED_RELEASE_TABLET | ORAL | Status: DC

## 2015-05-18 MED ORDER — POTASSIUM CHLORIDE CRYS ER 20 MEQ PO TBCR
40.0000 meq | EXTENDED_RELEASE_TABLET | Freq: Once | ORAL | Status: AC
  Administered 2015-05-18: 40 meq via ORAL
  Filled 2015-05-18: qty 2

## 2015-05-18 NOTE — Progress Notes (Signed)
PATIENT DETAILS Name: [name] FlavinShirley M Harvill Age: 81 y.o. Sex: female Date of Birth: 01-07-1935 Admit Date: 05/14/2015 Admitting Physician Eston EstersAhmad Hamad, MD WUJ:[id number],NFAOZPCP:BURCHETTE,BRUCE W, MD  Brief narrative: 81 year old female with history of dementia, known stage IV sacral decubitus requiring a diverting colostomy with prior history of pelvic osteomyelitis-just discharged on 05/02/15-readmitted on 3/1 for sepsis secondary to infected sacral decubitus ulcer. Gen. surgery consulted, placed on broad-spectrum antibiotics. Slowly improving, given overall poor health and failure to thrive syndrome, palliative care consulted. See below for further details.  Subjective:  Sitting up in bed, no fever chills, denies any headache chest or abdominal pain, appears to be in no distress, no shortness of breath.  Assessment/Plan:   Sepsis: Secondary to infected stage IV decubitus ulcer. Chest x-ray/UA negative for infection. Clinically improving with decreasing leukocytosis-Continue empiric vancomycin and Zosyn. Blood culture negative so far.  Infected Sacral decubitus ulcer: Has a history of chronic sacral stage IV decubitus ulcer, also has a prior history of pelvic osteomyelitis. However her most recent MRI on 04/30/15 was negative for osteomyelitis. Has diverting colostomy which is working well, Gen. surgery consulted, recommendations are for hydrotherapy and wound care. Continue empiric antibiotics-spoke with husband at bedside, explained poor overall prognoses, likely he will have recurrent issues with one infection. Likely discharge to SNF so on will switch to oral antibiotics upon discharge. Blood cultures negative.  Hypertension: Appears controlled, continue amlodipine and metoprolol.  Failure to thrive syndrome: Continue supplements, continue care/PT OT evaluation. Poor overall prognosis-family aware.   Dementia:  Pleasantly confused-but answers most of my questions appropriately. Resume  Aricept.  Moderate protein calorie malnutrition: Continue supplements.  Palliative care:  Elderly frail patient with dementia, chronic sacral decubitus ulcer requiring a diverting colostomy with recent admission for infected sacral decubitus, readmitted on 05/14/15 for a infected sacral decubitus ulcer causing sepsis. Fortunately appears to have deteriorated functionally over the past few months-now essentially bedbound, with recurrent purulent infection of stage IV sacral decubitus. Unfortunately, poor overall long-term prognoses. Palliative care consultation obtained, recommendations are to continue with wound care, antibiotics, and have hospice/palliative care follow-up while at SNF. Made DO NOT RESUSCITATE by palliative care.   Disposition: Remain inpatient  Antimicrobial agents  See below  Anti-infectives    Start     Dose/Rate Route Frequency Ordered Stop   05/15/15 2000  vancomycin (VANCOCIN) IVPB 1000 mg/200 mL premix     1,000 mg 100 mL/hr over 120 Minutes Intravenous Every 24 hours 05/14/15 2251     05/15/15 0400  piperacillin-tazobactam (ZOSYN) IVPB 3.375 g     3.375 g 12.5 mL/hr over 240 Minutes Intravenous 3 times per day 05/14/15 2250     05/14/15 2045  ampicillin-sulbactam (UNASYN) 1.5 g in sodium chloride 0.9 % 50 mL IVPB  Status:  Discontinued     1.5 g 100 mL/hr over 30 Minutes Intravenous Every 6 hours 05/14/15 2038 05/14/15 2245   05/14/15 2030  vancomycin (VANCOCIN) IVPB 1000 mg/200 mL premix     1,000 mg 200 mL/hr over 60 Minutes Intravenous  Once 05/14/15 2022 05/14/15 2136      DVT Prophylaxis: Prophylactic Lovenox   Code Status: Full code   Family Communication Percival Spanishoy Holle-spouse-at bedside by previous M.D.  Procedures: None  CONSULTS:  general surgery  Time spent 30 minutes-Greater than 50% of this time was spent in counseling, explanation of diagnosis, planning of further management, and coordination of care.  MEDICATIONS: Scheduled  Meds: .  amLODipine  5 mg Oral Daily  . collagenase   Topical Daily  . cyanocobalamin  500 mcg Oral Daily  . donepezil  10 mg Oral QHS  . enoxaparin (LOVENOX) injection  40 mg Subcutaneous Q24H  . feeding supplement  1 Container Oral TID BM  . feeding supplement (PRO-STAT SUGAR FREE 64)  30 mL Oral TID WC  . megestrol  200 mg Oral BID  . metoprolol succinate  25 mg Oral Daily  . multivitamin with minerals  1 tablet Oral Daily  . piperacillin-tazobactam (ZOSYN)  IV  3.375 g Intravenous 3 times per day  . potassium chloride  10 mEq Intravenous Q1 Hr x 4  . vancomycin  1,000 mg Intravenous Q24H  . vitamin C  500 mg Oral BID   Anti-infectives    Start     Dose/Rate Route Frequency Ordered Stop   05/15/15 2000  vancomycin (VANCOCIN) IVPB 1000 mg/200 mL premix     1,000 mg 100 mL/hr over 120 Minutes Intravenous Every 24 hours 05/14/15 2251     05/15/15 0400  piperacillin-tazobactam (ZOSYN) IVPB 3.375 g     3.375 g 12.5 mL/hr over 240 Minutes Intravenous 3 times per day 05/14/15 2250     05/14/15 2045  ampicillin-sulbactam (UNASYN) 1.5 g in sodium chloride 0.9 % 50 mL IVPB  Status:  Discontinued     1.5 g 100 mL/hr over 30 Minutes Intravenous Every 6 hours 05/14/15 2038 05/14/15 2245   05/14/15 2030  vancomycin (VANCOCIN) IVPB 1000 mg/200 mL premix     1,000 mg 200 mL/hr over 60 Minutes Intravenous  Once 05/14/15 2022 05/14/15 2136         PHYSICAL EXAM: Vital signs in last 24 hours: Filed Vitals:   05/16/15 0507 05/16/15 2107 05/17/15 0718 05/17/15 2033  BP: 137/94 139/69 139/70 141/74  Pulse: 95 100 73 97  Temp: 99 F (37.2 C) 98.1 F (36.7 C) 98.6 F (37 C) 98.4 F (36.9 C)  TempSrc: Axillary Axillary Axillary Oral  Resp: Height:      Weight:      SpO2: 100% 100% 100% 99%    Weight change:  Filed Weights   05/14/15 1910 05/14/15 2344  Weight: 54.432 kg (120 lb) 62.4 kg (137 lb 9.1 oz)   Body mass index is 21.54 kg/(m^2).   Gen Exam: Sleeping-but awake-and  answers most of my questions appropriately. Slow but clear speech.   Neck: Supple, No JVD.   Chest: B/L Clear.   CVS: S1 S2 Regular, no murmurs.  Abdomen: soft, BS +, non tender, non distended. Colostomy in place-brown stools in colostomy  Extremities: no edema, lower extremities warm to touch. Neurologic: Non Focal-but with generalized weakness  Skin: No Rash.   Wounds: N/A.  Intake/Output from previous day:  Intake/Output Summary (Last 24 hours) at 05/18/15 1103 Last data filed at 05/18/15 1042  Gross per 24 hour  Intake    420 ml  Output    425 ml  Net     -5 ml     LAB RESULTS: CBC  Recent Labs Lab 05/14/15 1941 05/16/15 0749 05/17/15 1127  WBC 25.6* 14.9* 16.6*  HGB 13.3 12.2 11.7*  HCT 41.2 37.2 35.7*  PLT 386 373 395  MCV 85.7 84.9 84.6  MCH 27.7 27.9 27.7  MCHC 32.3 32.8 32.8  RDW 16.3* 16.0* 16.2*  LYMPHSABS 1.8  --   --   MONOABS 1.5*  --   --  EOSABS 1.0*  --   --   BASOSABS 0.0  --   --     Chemistries   Recent Labs Lab 05/14/15 1941 05/16/15 0749 05/17/15 1127  NA 145 141 145  K 3.7 4.1 3.0*  CL 106 108 109  CO2 23 21* 27  GLUCOSE 92 148* 153*  BUN 19 11 14   CREATININE 0.57 0.67 0.73  CALCIUM 9.6 8.7* 8.4*    CBG: No results for input(s): GLUCAP in the last 168 hours.  GFR Estimated Creatinine Clearance: 56.4 mL/min (by C-G formula based on Cr of 0.73).  Coagulation profile No results for input(s): INR, PROTIME in the last 168 hours.  Cardiac Enzymes No results for input(s): CKMB, TROPONINI, MYOGLOBIN in the last 168 hours.  Invalid input(s): CK  Invalid input(s): POCBNP No results for input(s): DDIMER in the last 72 hours. No results for input(s): HGBA1C in the last 72 hours. No results for input(s): CHOL, HDL, LDLCALC, TRIG, CHOLHDL, LDLDIRECT in the last 72 hours. No results for input(s): TSH, T4TOTAL, T3FREE, THYROIDAB in the last 72 hours.  Invalid input(s): FREET3 No results for input(s): VITAMINB12, FOLATE,  FERRITIN, TIBC, IRON, RETICCTPCT in the last 72 hours. No results for input(s): LIPASE, AMYLASE in the last 72 hours.  Urine Studies No results for input(s): UHGB, CRYS in the last 72 hours.  Invalid input(s): UACOL, UAPR, USPG, UPH, UTP, UGL, UKET, UBIL, UNIT, UROB, ULEU, UEPI, UWBC, URBC, UBAC, CAST, UCOM, BILUA  MICROBIOLOGY: Recent Results (from the past 240 hour(s))  Culture, blood (routine x 2)     Status: None (Preliminary result)   Collection Time: 05/14/15  7:41 PM  Result Value Ref Range Status   Specimen Description BLOOD RIGHT HAND  Final   Special Requests IN PEDIATRIC BOTTLE 1CC  Final   Culture NO GROWTH 3 DAYS  Final   Report Status PENDING  Incomplete    RADIOLOGY STUDIES/RESULTS: Mr Pelvis W Wo Contrast  04/30/2015  CLINICAL DATA:  Stage IV sacral decubitus ulcer with fever and elevated white blood count. Osteomyelitis of the sacrum. EXAM: MRI PELVIS WITHOUT AND WITH CONTRAST TECHNIQUE: Multiplanar multisequence MR imaging of the pelvis was performed both before and after administration of intravenous contrast. CONTRAST:  10mL MULTIHANCE GADOBENATE DIMEGLUMINE 529 MG/ML IV SOLN COMPARISON:  MRI dated 11/15/2014 and CT scan dated 11/15/2014 FINDINGS: There is a midline sacral decubitus ulcer. The ulcer extends just superficial to the distal sacrum. There is either been resection or destruction of the fourth and fifth sacral segments and a partial resection of the proximal coccyx since the prior exam. There is new enhancement of the gluteus maximus muscles adjacent to tear sacral attachments and just medial and lateral to the sacral decubitus ulcer, consistent with myositis. There is no definable abscess or current osteomyelitis. Ostomy is noted in the left lower quadrant. There is no adenopathy or mass lesion. There is atrophy of the muscles of the hips in both proximal thighs. Right hip prosthesis noted. IMPRESSION: 1. No evidence of current osteomyelitis or soft tissue  abscess. 2. Deep sacral decubitus ulcer with adjacent focal myositis of the gluteus maximus muscles. I Electronically Signed   By: Francene Boyers M.D.   On: 04/30/2015 10:16   Dg Chest Port 1 View  05/15/2015  CLINICAL DATA:  Shortness of breath. EXAM: PORTABLE CHEST 1 VIEW COMPARISON:  04/29/2015 FINDINGS: Patient is rotated towards the right. The aorta is tortuous and mildly calcified. Heart size is accentuated by the portable technique. The lungs  are free of focal consolidations and pleural effusions. Mild mid thoracic spondylosis noted. IMPRESSION: No evidence for acute cardiopulmonary abnormality. Electronically Signed   By: Norva Pavlov M.D.   On: 05/15/2015 08:50   Dg Chest Port 1 View  04/29/2015  CLINICAL DATA:  81 year old female with fever and wound infection EXAM: PORTABLE CHEST 1 VIEW COMPARISON:  Prior chest x-ray 11/24/2014 FINDINGS: Low inspiratory volumes with mild bibasilar atelectasis. Stable cardiac and mediastinal contours. Atherosclerotic calcifications throughout the thoracic aorta. Stable mild chronic bronchitic change. No pulmonary edema, focal consolidation or pneumothorax. No acute osseous abnormality. IMPRESSION: 1. Low inspiratory volumes with mild bibasilar atelectasis. 2. Otherwise, stable chest x-ray without acute cardiopulmonary process. Electronically Signed   By: Malachy Moan M.D.   On: 04/29/2015 16:44    Leroy Sea, MD  [facility] Pager:[phone]  If 7PM-7AM, please contact night-coverage [email or web address] Password [email or web address] 05/18/2015, 11:03 AM   LOS: 4 days

## 2015-05-19 LAB — CBC
HEMATOCRIT: 40.9 % (ref 36.0–46.0)
Hemoglobin: 13.2 g/dL (ref 12.0–15.0)
MCH: 27.6 pg (ref 26.0–34.0)
MCHC: 32.3 g/dL (ref 30.0–36.0)
MCV: 85.6 fL (ref 78.0–100.0)
Platelets: 397 10*3/uL (ref 150–400)
RBC: 4.78 MIL/uL (ref 3.87–5.11)
RDW: 16.6 % — AB (ref 11.5–15.5)
WBC: 20.6 10*3/uL — AB (ref 4.0–10.5)

## 2015-05-19 LAB — BASIC METABOLIC PANEL
Anion gap: 11 (ref 5–15)
BUN: 12 mg/dL (ref 6–20)
CHLORIDE: 109 mmol/L (ref 101–111)
CO2: 25 mmol/L (ref 22–32)
CREATININE: 0.67 mg/dL (ref 0.44–1.00)
Calcium: 8.7 mg/dL — ABNORMAL LOW (ref 8.9–10.3)
GFR calc Af Amer: >60 mL/min (ref >60)
GFR calc non Af Amer: >60 mL/min (ref >60)
GLUCOSE: 89 mg/dL (ref 65–99)
Potassium: 4 mmol/L (ref 3.5–5.1)
SODIUM: 145 mmol/L (ref 135–145)

## 2015-05-19 LAB — CULTURE, BLOOD (ROUTINE X 2): Culture: NO GROWTH

## 2015-05-19 LAB — MAGNESIUM: MAGNESIUM: 2 mg/dL (ref 1.7–2.4)

## 2015-05-19 MED ORDER — METRONIDAZOLE 500 MG PO TABS
500.0000 mg | ORAL_TABLET | Freq: Three times a day (TID) | ORAL | Status: DC
  Administered 2015-05-19 – 2015-05-20 (×3): 500 mg via ORAL
  Filled 2015-05-19 (×4): qty 1

## 2015-05-19 MED ORDER — CEFPODOXIME PROXETIL 200 MG PO TABS
200.0000 mg | ORAL_TABLET | Freq: Two times a day (BID) | ORAL | Status: DC
  Administered 2015-05-19 – 2015-05-20 (×2): 200 mg via ORAL
  Filled 2015-05-19 (×4): qty 1

## 2015-05-19 NOTE — Progress Notes (Signed)
PATIENT DETAILS Name: [name] Frank Age: 81 y.o. Sex: female Date of Birth: [birth year] Admit Date: 05/14/2015 Admitting Physician Eston Esters, MD ZOX:[id number],WJXBJ W, MD  Brief narrative:  81 year old female with history of dementia, known stage IV sacral decubitus requiring a diverting colostomy with prior history of pelvic osteomyelitis-just discharged on 05/02/15-readmitted on 3/1 for sepsis secondary to infected sacral decubitus ulcer. Gen. surgery consulted, placed on broad-spectrum antibiotics. Slowly improving, given overall poor health and failure to thrive syndrome, palliative care consulted. See below for further details.  Subjective:  Sitting up in bed, no fever chills, denies any headache chest or abdominal pain, appears to be in no distress, no shortness of breath.  Assessment/Plan:   Sepsis: Secondary to infected stage IV decubitus ulcer. Chest x-ray/UA negative for infection. Clinically improving with decreasing leukocytosis-Continue empiric vancomycin and Zosyn. Blood culture negative so far.  Infected Sacral decubitus ulcer: Has a history of chronic sacral stage IV decubitus ulcer, also has a prior history of pelvic osteomyelitis. However her most recent MRI on 04/30/15 was negative for osteomyelitis. Has diverting colostomy which is working well, Gen. surgery consulted, recommendations are for hydrotherapy and wound care. Itch from empiric vancomycin and Zosyn to oral Vantin and Flagyl on 05/19/2015, explained poor overall prognoses, likely he will have recurrent issues with infections. Discharge to SNF in 1-2 days on PO ABX. Blood cultures negative.  Hypertension: Appears controlled, continue amlodipine and metoprolol.  Failure to thrive syndrome: Continue supplements, continue care/PT OT evaluation. Poor overall prognosis-family aware.   Dementia:  Pleasantly confused-but answers most of my questions appropriately. Resume Aricept.  Moderate  protein calorie malnutrition: Continue supplements.  Morbilliform rash on the trunk. Acute on chronic. Likely due to Zosyn, appears to be a drug rash, stop vancomycin and Zosyn switched to oral Vantin and Flagyl on 05/19/2015. Patient is asymptomatic from the rash.   Palliative care:  Elderly frail patient with dementia, chronic sacral decubitus ulcer requiring a diverting colostomy with recent admission for infected sacral decubitus, readmitted on 05/14/15 for a infected sacral decubitus ulcer causing sepsis. Fortunately appears to have deteriorated functionally over the past few months-now essentially bedbound, with recurrent purulent infection of stage IV sacral decubitus. Unfortunately, poor overall long-term prognoses. Palliative care consultation obtained, recommendations are to continue with wound care, antibiotics, and have hospice/palliative care follow-up while at SNF. Made DO NOT RESUSCITATE by palliative care.    Disposition: SNF 1-2 days  Antimicrobial agents  See below  Anti-infectives    Start     Dose/Rate Route Frequency Ordered Stop   05/19/15 1400  metroNIDAZOLE (FLAGYL) tablet 500 mg     500 mg Oral 3 times per day 05/19/15 1054     05/19/15 1100  cefpodoxime (VANTIN) tablet 200 mg     200 mg Oral Every 12 hours 05/19/15 1054     05/15/15 2000  vancomycin (VANCOCIN) IVPB 1000 mg/200 mL premix  Status:  Discontinued     1,000 mg 100 mL/hr over 120 Minutes Intravenous Every 24 hours 05/14/15 2251 05/19/15 1054   05/15/15 0400  piperacillin-tazobactam (ZOSYN) IVPB 3.375 g  Status:  Discontinued     3.375 g 12.5 mL/hr over 240 Minutes Intravenous 3 times per day 05/14/15 2250 05/19/15 1054   05/14/15 2045  ampicillin-sulbactam (UNASYN) 1.5 g in sodium chloride 0.9 % 50 mL IVPB  Status:  Discontinued     1.5 g 100 mL/hr over 30 Minutes  Intravenous Every 6 hours 05/14/15 2038 05/14/15 2245   05/14/15 2030  vancomycin (VANCOCIN) IVPB 1000 mg/200 mL premix     1,000 mg 200  mL/hr over 60 Minutes Intravenous  Once 05/14/15 2022 05/14/15 2136      DVT Prophylaxis: Prophylactic Lovenox   Code Status: Full code   Family Communication Percival Spanish bedside by previous M.D.  Procedures: None  CONSULTS:  general surgery, Pall care  Time spent 30 minutes-Greater than 50% of this time was spent in counseling, explanation of diagnosis, planning of further management, and coordination of care.  MEDICATIONS: Scheduled Meds: . amLODipine  5 mg Oral Daily  . cefpodoxime  200 mg Oral Q12H  . collagenase   Topical Daily  . donepezil  10 mg Oral QHS  . enoxaparin (LOVENOX) injection  40 mg Subcutaneous Q24H  . feeding supplement  1 Container Oral TID BM  . feeding supplement (PRO-STAT SUGAR FREE 64)  30 mL Oral TID WC  . megestrol  200 mg Oral BID  . metoprolol succinate  25 mg Oral Daily  . metroNIDAZOLE  500 mg Oral 3 times per day  . multivitamin with minerals  1 tablet Oral Daily  . vitamin B-12  500 mcg Oral Daily  . vitamin C  500 mg Oral BID   Anti-infectives    Start     Dose/Rate Route Frequency Ordered Stop   05/19/15 1400  metroNIDAZOLE (FLAGYL) tablet 500 mg     500 mg Oral 3 times per day 05/19/15 1054     05/19/15 1100  cefpodoxime (VANTIN) tablet 200 mg     200 mg Oral Every 12 hours 05/19/15 1054     05/15/15 2000  vancomycin (VANCOCIN) IVPB 1000 mg/200 mL premix  Status:  Discontinued     1,000 mg 100 mL/hr over 120 Minutes Intravenous Every 24 hours 05/14/15 2251 05/19/15 1054   05/15/15 0400  piperacillin-tazobactam (ZOSYN) IVPB 3.375 g  Status:  Discontinued     3.375 g 12.5 mL/hr over 240 Minutes Intravenous 3 times per day 05/14/15 2250 05/19/15 1054   05/14/15 2045  ampicillin-sulbactam (UNASYN) 1.5 g in sodium chloride 0.9 % 50 mL IVPB  Status:  Discontinued     1.5 g 100 mL/hr over 30 Minutes Intravenous Every 6 hours 05/14/15 2038 05/14/15 2245   05/14/15 2030  vancomycin (VANCOCIN) IVPB 1000 mg/200 mL premix      1,000 mg 200 mL/hr over 60 Minutes Intravenous  Once 05/14/15 2022 05/14/15 2136         PHYSICAL EXAM: Vital signs in last 24 hours: Filed Vitals:   05/17/15 2033 05/18/15 1650 05/18/15 2258 05/19/15 0614  BP: 141/74 139/68 131/71 153/80  Pulse: 97 89 65 66  Temp: 98.4 F (36.9 C) 98.5 F (36.9 C) 99 F (37.2 C) 98.2 F (36.8 C)  TempSrc: Oral Oral Oral   Resp: Height:      Weight:      SpO2: 99% 100% 98% 97%    Weight change:  Filed Weights   05/14/15 1910 05/14/15 2344  Weight: 54.432 kg (120 lb) 62.4 kg (137 lb 9.1 oz)   Body mass index is 21.54 kg/(m^2).   Gen Exam: Sleeping-but awake-and answers most of my questions appropriately. Slow but clear speech.   Neck: Supple, No JVD.   Chest: B/L Clear.   CVS: S1 S2 Regular, no murmurs.  Abdomen: soft, BS +, non tender, non distended. Colostomy in place-brown stools in colostomy  Extremities: no edema, lower extremities warm to touch. Neurologic: Non Focal-but with generalized weakness  Skin: No Rash.   Wounds: N/A.  Intake/Output from previous day:  Intake/Output Summary (Last 24 hours) at 05/19/15 1054 Last data filed at 05/19/15 0615  Gross per 24 hour  Intake    930 ml  Output    550 ml  Net    380 ml     LAB RESULTS: CBC  Recent Labs Lab 05/14/15 1941 05/16/15 0749 05/17/15 1127  WBC 25.6* 14.9* 16.6*  HGB 13.3 12.2 11.7*  HCT 41.2 37.2 35.7*  PLT 386 373 395  MCV 85.7 84.9 84.6  MCH 27.7 27.9 27.7  MCHC 32.3 32.8 32.8  RDW 16.3* 16.0* 16.2*  LYMPHSABS 1.8  --   --   MONOABS 1.5*  --   --   EOSABS 1.0*  --   --   BASOSABS 0.0  --   --     Chemistries   Recent Labs Lab 05/14/15 1941 05/16/15 0749 05/17/15 1127  NA 145 141 145  K 3.7 4.1 3.0*  CL 106 108 109  CO2 23 21* 27  GLUCOSE 92 148* 153*  BUN CREATININE 0.57 0.67 0.73  CALCIUM 9.6 8.7* 8.4*    CBG: No results for input(s): GLUCAP in the last 168 hours.  GFR Estimated Creatinine Clearance:  56.4 mL/min (by C-G formula based on Cr of 0.73).  Coagulation profile No results for input(s): INR, PROTIME in the last 168 hours.  Cardiac Enzymes No results for input(s): CKMB, TROPONINI, MYOGLOBIN in the last 168 hours.  Invalid input(s): CK  Invalid input(s): POCBNP No results for input(s): DDIMER in the last 72 hours. No results for input(s): HGBA1C in the last 72 hours. No results for input(s): CHOL, HDL, LDLCALC, TRIG, CHOLHDL, LDLDIRECT in the last 72 hours. No results for input(s): TSH, T4TOTAL, T3FREE, THYROIDAB in the last 72 hours.  Invalid input(s): FREET3 No results for input(s): VITAMINB12, FOLATE, FERRITIN, TIBC, IRON, RETICCTPCT in the last 72 hours. No results for input(s): LIPASE, AMYLASE in the last 72 hours.  Urine Studies No results for input(s): UHGB, CRYS in the last 72 hours.  Invalid input(s): UACOL, UAPR, USPG, UPH, UTP, UGL, UKET, UBIL, UNIT, UROB, ULEU, UEPI, UWBC, URBC, UBAC, CAST, UCOM, BILUA  MICROBIOLOGY: Recent Results (from the past 240 hour(s))  Culture, blood (routine x 2)     Status: None (Preliminary result)   Collection Time: 05/14/15  7:41 PM  Result Value Ref Range Status   Specimen Description BLOOD RIGHT HAND  Final   Special Requests IN PEDIATRIC BOTTLE 1CC  Final   Culture NO GROWTH 4 DAYS  Final   Report Status PENDING  Incomplete    RADIOLOGY STUDIES/RESULTS: Mr Pelvis W Wo Contrast  04/30/2015  CLINICAL DATA:  Stage IV sacral decubitus ulcer with fever and elevated white blood count. Osteomyelitis of the sacrum. EXAM: MRI PELVIS WITHOUT AND WITH CONTRAST TECHNIQUE: Multiplanar multisequence MR imaging of the pelvis was performed both before and after administration of intravenous contrast. CONTRAST:  10mL MULTIHANCE GADOBENATE DIMEGLUMINE 529 MG/ML IV SOLN COMPARISON:  MRI dated 11/15/2014 and CT scan dated 11/15/2014 FINDINGS: There is a midline sacral decubitus ulcer. The ulcer extends just superficial to the distal sacrum.  There is either been resection or destruction of the fourth and fifth sacral segments and a partial resection of the proximal coccyx since the prior exam. There is new enhancement of the gluteus maximus muscles adjacent to  tear sacral attachments and just medial and lateral to the sacral decubitus ulcer, consistent with myositis. There is no definable abscess or current osteomyelitis. Ostomy is noted in the left lower quadrant. There is no adenopathy or mass lesion. There is atrophy of the muscles of the hips in both proximal thighs. Right hip prosthesis noted. IMPRESSION: 1. No evidence of current osteomyelitis or soft tissue abscess. 2. Deep sacral decubitus ulcer with adjacent focal myositis of the gluteus maximus muscles. I Electronically Signed   By: Francene BoyersJames  Maxwell M.D.   On: 04/30/2015 10:16   Dg Chest Port 1 View  05/15/2015  CLINICAL DATA:  Shortness of breath. EXAM: PORTABLE CHEST 1 VIEW COMPARISON:  04/29/2015 FINDINGS: Patient is rotated towards the right. The aorta is tortuous and mildly calcified. Heart size is accentuated by the portable technique. The lungs are free of focal consolidations and pleural effusions. Mild mid thoracic spondylosis noted. IMPRESSION: No evidence for acute cardiopulmonary abnormality. Electronically Signed   By: Norva PavlovElizabeth  Brown M.D.   On: 05/15/2015 08:50   Dg Chest Port 1 View  04/29/2015  CLINICAL DATA:  81 year old female with fever and wound infection EXAM: PORTABLE CHEST 1 VIEW COMPARISON:  Prior chest x-ray 11/24/2014 FINDINGS: Low inspiratory volumes with mild bibasilar atelectasis. Stable cardiac and mediastinal contours. Atherosclerotic calcifications throughout the thoracic aorta. Stable mild chronic bronchitic change. No pulmonary edema, focal consolidation or pneumothorax. No acute osseous abnormality. IMPRESSION: 1. Low inspiratory volumes with mild bibasilar atelectasis. 2. Otherwise, stable chest x-ray without acute cardiopulmonary process. Electronically  Signed   By: Malachy MoanHeath  McCullough M.D.   On: 04/29/2015 16:44    Leroy SeaSINGH,Liyana Suniga K, MD  [facility] Pager:[phone]  If 7PM-7AM, please contact night-coverage [email or web address] Password [email or web address] 05/19/2015, 10:54 AM   LOS: 5 days

## 2015-05-19 NOTE — Progress Notes (Signed)
Physical Therapy Wound Treatment Patient Details  Name: [name] Frank MRN: [id number] Date of Birth: 08/16/1934  Today's Date: 05/19/2015 Time: 0943-1023 Time Calculation (min): 40 min  Subjective  Subjective: Pt agreeable but mostly non-verbal during session.  Patient and Family Stated Goals: Pt didn't state Date of Onset:  (Prior to admission) Prior Treatments: Dressing changes, VAC  Pain Score:  Pt in no apparent pain/distress throughout treatment.   Wound Assessment  Wound / Incision (Open or Dehisced) 04/30/15 Other (Comment) Sacrum Medial Stage IV approximate quarter size (Active)  Dressing Type Foam;Barrier Film (skin prep);Moist to dry 05/19/2015 10:27 AM  Dressing Changed Changed 05/19/2015 10:27 AM  Dressing Status Clean;Dry;Intact 05/19/2015 10:27 AM  Dressing Change Frequency Daily 05/19/2015 10:27 AM  Site / Wound Assessment Red;Yellow;Brown 05/19/2015 10:27 AM  % Wound base Red or Granulating 60% 05/19/2015 10:27 AM  % Wound base Yellow 35% 05/19/2015 10:27 AM  % Wound base Black 5% 05/19/2015 10:27 AM  % Wound base Other (Comment) 0% 05/19/2015 10:27 AM  Peri-wound Assessment Denuded;Maceration 05/19/2015 10:27 AM  Wound Length (cm) 5 cm 05/16/2015  8:52 AM  Wound Width (cm) 7.5 cm 05/16/2015  8:52 AM  Wound Depth (cm) 2.7 cm 05/16/2015  8:52 AM  Undermining (cm) 12 to 3 o'clock up to 3cm 05/16/2015  8:52 AM  Margins Unattached edges (unapproximated) 05/19/2015 10:27 AM  Closure None 05/19/2015 10:27 AM  Drainage Amount Minimal 05/19/2015 10:27 AM  Drainage Description Serosanguineous 05/19/2015 10:27 AM  Non-staged Wound Description Not applicable 05/19/2015 10:27 AM  Treatment Debridement (Selective);Hydrotherapy (Pulse lavage);Packing (Saline gauze) 05/19/2015 10:27 AM  Santyl applied to wound bed prior to applying dressing.   Hydrotherapy Pulsed lavage therapy - wound location: sacrum Pulsed Lavage with Suction (psi): 8 psi Pulsed Lavage with Suction - Normal Saline Used: 1000 mL Pulsed  Lavage Tip: Tip with splash shield Selective Debridement Selective Debridement - Location: sacrum Selective Debridement - Tools Used: Forceps;Scissors Selective Debridement - Tissue Removed: Yellow and brown necrotic tissue   Wound Assessment and Plan  Wound Therapy - Assess/Plan/Recommendations Wound Therapy - Clinical Statement: Steady progress with removal of necrotic tissue.  Wound Therapy - Functional Problem List: Decr time out of bed Factors Delaying/Impairing Wound Healing: Immobility;Polypharmacy;Multiple medical problems Hydrotherapy Plan: Debridement;Dressing change;Patient/family education;Pulsatile lavage with suction Wound Therapy - Frequency: 6X / week Wound Therapy - Follow Up Recommendations: Skilled nursing facility Wound Plan: See above  Wound Therapy Goals- Improve the function of patient's integumentary system by progressing the wound(s) through the phases of wound healing (inflammation - proliferation - remodeling) by: Decrease Necrotic Tissue to: 30% Decrease Necrotic Tissue - Progress: Progressing toward goal Increase Granulation Tissue to: 70% Increase Granulation Tissue - Progress: Progressing toward goal Goals/treatment plan/discharge plan were made with and agreed upon by patient/family: No, Patient unable to participate in goals/treatment/discharge plan and family unavailable Time For Goal Achievement: 7 days Wound Therapy - Potential for Goals: Good  Goals will be updated until maximal potential achieved or discharge criteria met.  Discharge criteria: when goals achieved, discharge from hospital, MD decision/surgical intervention, no progress towards goals, refusal/missing three consecutive treatments without notification or medical reason.  GP     ,  05/19/2015, 10:44 AM    , PT, DPT Acute Rehabilitation Services Pager: [phone]    

## 2015-05-20 DIAGNOSIS — R278 Other lack of coordination: Secondary | ICD-10-CM | POA: Diagnosis not present

## 2015-05-20 DIAGNOSIS — L089 Local infection of the skin and subcutaneous tissue, unspecified: Secondary | ICD-10-CM | POA: Diagnosis not present

## 2015-05-20 DIAGNOSIS — R627 Adult failure to thrive: Secondary | ICD-10-CM | POA: Diagnosis not present

## 2015-05-20 DIAGNOSIS — M1990 Unspecified osteoarthritis, unspecified site: Secondary | ICD-10-CM | POA: Diagnosis not present

## 2015-05-20 DIAGNOSIS — T148 Other injury of unspecified body region: Secondary | ICD-10-CM | POA: Diagnosis not present

## 2015-05-20 DIAGNOSIS — Z8614 Personal history of Methicillin resistant Staphylococcus aureus infection: Secondary | ICD-10-CM | POA: Diagnosis not present

## 2015-05-20 DIAGNOSIS — L89154 Pressure ulcer of sacral region, stage 4: Secondary | ICD-10-CM | POA: Diagnosis not present

## 2015-05-20 DIAGNOSIS — Z933 Colostomy status: Secondary | ICD-10-CM | POA: Diagnosis not present

## 2015-05-20 DIAGNOSIS — E440 Moderate protein-calorie malnutrition: Secondary | ICD-10-CM | POA: Diagnosis not present

## 2015-05-20 DIAGNOSIS — M6281 Muscle weakness (generalized): Secondary | ICD-10-CM | POA: Diagnosis not present

## 2015-05-20 DIAGNOSIS — L89159 Pressure ulcer of sacral region, unspecified stage: Secondary | ICD-10-CM | POA: Diagnosis not present

## 2015-05-20 DIAGNOSIS — S31809A Unspecified open wound of unspecified buttock, initial encounter: Secondary | ICD-10-CM | POA: Diagnosis not present

## 2015-05-20 DIAGNOSIS — A419 Sepsis, unspecified organism: Secondary | ICD-10-CM | POA: Diagnosis not present

## 2015-05-20 DIAGNOSIS — E43 Unspecified severe protein-calorie malnutrition: Secondary | ICD-10-CM | POA: Diagnosis not present

## 2015-05-20 DIAGNOSIS — F0390 Unspecified dementia without behavioral disturbance: Secondary | ICD-10-CM | POA: Diagnosis not present

## 2015-05-20 DIAGNOSIS — M8638 Chronic multifocal osteomyelitis, other site: Secondary | ICD-10-CM | POA: Diagnosis not present

## 2015-05-20 DIAGNOSIS — D649 Anemia, unspecified: Secondary | ICD-10-CM | POA: Diagnosis not present

## 2015-05-20 DIAGNOSIS — I10 Essential (primary) hypertension: Secondary | ICD-10-CM | POA: Diagnosis not present

## 2015-05-20 DIAGNOSIS — R279 Unspecified lack of coordination: Secondary | ICD-10-CM | POA: Diagnosis not present

## 2015-05-20 DIAGNOSIS — I96 Gangrene, not elsewhere classified: Secondary | ICD-10-CM | POA: Diagnosis not present

## 2015-05-20 DIAGNOSIS — Z96649 Presence of unspecified artificial hip joint: Secondary | ICD-10-CM | POA: Diagnosis not present

## 2015-05-20 LAB — CBC
HEMATOCRIT: 38.7 % (ref 36.0–46.0)
HEMOGLOBIN: 13 g/dL (ref 12.0–15.0)
MCH: 28.1 pg (ref 26.0–34.0)
MCHC: 33.6 g/dL (ref 30.0–36.0)
MCV: 83.8 fL (ref 78.0–100.0)
Platelets: 404 10*3/uL — ABNORMAL HIGH (ref 150–400)
RBC: 4.62 MIL/uL (ref 3.87–5.11)
RDW: 16.5 % — AB (ref 11.5–15.5)
WBC: 24.6 10*3/uL — AB (ref 4.0–10.5)

## 2015-05-20 MED ORDER — CEFPODOXIME PROXETIL 200 MG PO TABS: 200.0000 mg | ORAL_TABLET | Freq: Two times a day (BID) | ORAL | Status: DC

## 2015-05-20 MED ORDER — METRONIDAZOLE 500 MG PO TABS: 500.0000 mg | ORAL_TABLET | Freq: Three times a day (TID) | ORAL | Status: DC

## 2015-05-20 NOTE — Discharge Instructions (Signed)
Follow with Primary MD Kristian CoveyBURCHETTE,BRUCE W, MD in 3-4 days   Get CBC, CMP, 2 view Chest X ray checked  by Primary MD next visit.    Activity: As tolerated with Full fall precautions use walker/cane & assistance as needed   Disposition SNF   Diet:   Heart Healthy   with feeding assistance and aspiration precautions.  For Heart failure patients - Check your Weight same time everyday, if you gain over 2 pounds, or you develop in leg swelling, experience more shortness of breath or chest pain, call your Primary MD immediately. Follow Cardiac Low Salt Diet and 1.5 lit/day fluid restriction.   On your next visit with your primary care physician please Get Medicines reviewed and adjusted.   Please request your Prim.MD to go over all Hospital Tests and Procedure/Radiological results at the follow up, please get all Hospital records sent to your Prim MD by signing hospital release before you go home.   If you experience worsening of your admission symptoms, develop shortness of breath, life threatening emergency, suicidal or homicidal thoughts you must seek medical attention immediately by calling 911 or calling your MD immediately  if symptoms less severe.  You Must read complete instructions/literature along with all the possible adverse reactions/side effects for all the Medicines you take and that have been prescribed to you. Take any new Medicines after you have completely understood and accpet all the possible adverse reactions/side effects.   Do not drive, operating heavy machinery, perform activities at heights, swimming or participation in water activities or provide baby sitting services if your were admitted for syncope or siezures until you have seen by Primary MD or a Neurologist and advised to do so again.  Do not drive when taking Pain medications.    Do not take more than prescribed Pain, Sleep and Anxiety Medications  Special Instructions: If you have smoked or chewed Tobacco   in the last 2 yrs please stop smoking, stop any regular Alcohol  and or any Recreational drug use.  Wear Seat belts while driving.   Please note  You were cared for by a hospitalist during your hospital stay. If you have any questions about your discharge medications or the care you received while you were in the hospital after you are discharged, you can call the unit and asked to speak with the hospitalist on call if the hospitalist that took care of you is not available. Once you are discharged, your primary care physician will handle any further medical issues. Please note that NO REFILLS for any discharge medications will be authorized once you are discharged, as it is imperative that you return to your primary care physician (or establish a relationship with a primary care physician if you do not have one) for your aftercare needs so that they can reassess your need for medications and monitor your lab values.

## 2015-05-20 NOTE — Clinical Social Work Placement (Signed)
   CLINICAL SOCIAL WORK PLACEMENT  NOTE  Date:  05/20/2015  Patient Details  Name: [name] FlavinShirley M Cott MRN: [id number] Date of Birth: 10/03/1934  Clinical Social Work is seeking post-discharge placement for this patient at the Skilled  Nursing Facility level of care (*CSW will initial, date and re-position this form in  chart as items are completed):  Yes   Patient/family provided with [facility] Clinical Social Work Department's list of facilities offering this level of care within the geographic area requested by the patient (or if unable, by the patient's family).  Yes   Patient/family informed of their freedom to choose among providers that offer the needed level of care, that participate in Medicare, Medicaid or managed care program needed by the patient, have an available bed and are willing to accept the patient.  Yes   Patient/family informed of [facility]'s ownership interest in [facility] and [facility], as well as of the fact that they are under no obligation to receive care at these facilities.  PASRR submitted to EDS on       PASRR number received on       Existing PASRR number confirmed on       FL2 transmitted to all facilities in geographic area requested by pt/family on 05/15/15     FL2 transmitted to all facilities within larger geographic area on       Patient informed that his/her managed care company has contracts with or will negotiate with certain facilities, including the following:        Yes   Patient/family informed of bed offers received.  [facility]     Physician recommends and patient chooses bed at      Patient to be transferred to [facility] on 05/20/15.  Patient to be transferred to facility by Ambulance     Patient family notified on 05/20/15 of transfer.  Name of family member notified:  Viann FishWalter McBride (patient son)     PHYSICIAN Please sign FL2     Additional Comment:     Macario GoldsJesse Gigi Onstad, LCSW [phone]

## 2015-05-20 NOTE — Discharge Summary (Addendum)
[name] Frank, is a 81 y.o. female  DOB 1934-06-05  MRN [id number].  Admission date:  05/14/2015  Admitting Physician  Gennaro Africa, MD  Discharge Date:  05/20/2015   Primary MD  Eulas Post, MD  Recommendations for primary care physician for things to follow:   Check CBC, BMP in 4-5 days.  If declines further consider full comfort care.   Admission Diagnosis  Wound infection ([place]) [T14.8, L08.9] Sacral decubitus ulcer, stage IV ([place]) [L89.154]   Discharge Diagnosis  Wound infection ([place]) [T14.8, L08.9] Sacral decubitus ulcer, stage IV ([place]) [L89.154]    Active Problems:   Wound infection ([place])   Malnutrition of moderate degree   Sacral decubitus ulcer   Failure to thrive in adult      Past Medical History  Diagnosis Date  . Allergy   . Hyperlipidemia   . Hypertension   . Anxiety   . GERD (gastroesophageal reflux disease)   . Urinary frequency   . Neurogenic urinary incontinence     since back surgery  . Arthritis     degenerative oateoarthritis  . Decubital ulcer 11/14/2014  . Dementia     Past Surgical History  Procedure Laterality Date  . Appendectomy    . Cholecystectomy    . Abdominal hysterectomy    . Back surgery  2000  . Colonoscopy w/ polypectomy    . Breast surgery  1993    biopsy  . Total hip arthroplasty  01/07/2012    Procedure: TOTAL HIP ARTHROPLASTY;  Surgeon: Yvette Rack., MD;  Location: [facility];  Service: Orthopedics;  Laterality: Right;  . Bone marrow biopsy N/A 11/22/2014    Procedure: SACRAL BONE BIOPSY PRONE POSITION;  Surgeon: Georganna Skeans, MD;  Location: [facility];  Service: General;  Laterality: N/A;  . [place] loop colostomy closure N/A 11/22/2014    Procedure: LAPAROSCOPIC LOOP COLOSTOMY ;  Surgeon: Georganna Skeans, MD;  Location: [facility];  Service: General;   Laterality: N/A;       HPI  from the history and physical done on the day of admission:    81 year old female with history of dementia, known stage IV sacral decubitus requiring a diverting colostomy with prior history of pelvic osteomyelitis-just discharged on 05/02/15-readmitted on 3/1 for sepsis secondary to infected sacral decubitus ulcer. Gen. surgery consulted, placed on broad-spectrum antibiotics. Slowly improving, given overall poor health and failure to thrive syndrome, palliative care consulted. See below for further details.    Hospital Course:     Sepsis: Secondary to infected stage IV decubitus ulcer. Chest x-ray/UA negative for infection. Clinically improving.  Infected Sacral decubitus ulcer with leukocytosis: Has a history of chronic sacral stage IV decubitus ulcer, also has a prior history of pelvic osteomyelitis. However her most recent MRI on 04/30/15 was negative for osteomyelitis. Has diverting colostomy which is working well, Gen. surgery consulted, recommendations are for hydrotherapy and wound care. She was switched from empiric vancomycin and Zosyn to oral Vantin and Flagyl on 05/19/2015, explained poor overall prognoses, likely  he will have recurrent issues with infections. Discharge to SNF with continued wound care and hydrotherapy, stop date for antibiotics 05/28/2015. If declines further I would strongly encouraged to consider comfort measures.  Hypertension: Appears controlled, continue amlodipine and metoprolol.  Failure to thrive syndrome: Continue supplements, continue care/PT OT evaluation. Poor overall prognosis-family aware.   Dementia: Pleasantly confused-but answers most of my questions appropriately. Resume Aricept.  Moderate protein calorie malnutrition: Continue supplements.  Morbilliform rash on the trunk. Acute on chronic. Likely due to Zosyn, appears to be a drug rash, stopped vancomycin and Zosyn switched to oral Vantin and Flagyl on 05/19/2015.  Patient is asymptomatic from the rash.   Palliative care: Elderly frail patient with dementia, chronic sacral decubitus ulcer requiring a diverting colostomy with recent admission for infected sacral decubitus, readmitted on 05/14/15 for a infected sacral decubitus ulcer causing sepsis. Fortunately appears to have deteriorated functionally over the past few months-now essentially bedbound, with recurrent purulent infection of stage IV sacral decubitus. Unfortunately, poor overall long-term prognoses. Palliative care consultation obtained, recommendations are to continue with wound care, antibiotics, and have hospice/palliative care follow-up while at SNF. Made DO NOT RESUSCITATE by palliative care. I would strongly urge to consider full comfort care if she declines further.      Discharge Condition: Guarded  Follow UP  Follow-up Information    Follow up with Eulas Post, MD. Schedule an appointment as soon as possible for a visit in 3 days.   Specialty:  Family Medicine   Contact information:   [place] [address] [phone]       Follow up with [facility]. Schedule an appointment as soon as possible for a visit in 1 week.   Specialty:  General Surgery   Contact information:   [address] [place] [phone]       Consults obtained - general surgery, Pall care  Diet and Activity recommendation: See Discharge Instructions below  Discharge Instructions       Discharge Instructions    Diet - low sodium heart healthy    Complete by:  As directed      Discharge instructions    Complete by:  As directed   Follow with Primary MD Eulas Post, MD in 3-4 days   Get CBC, CMP, 2 view Chest X ray checked  by Primary MD next visit.    Activity: As tolerated with Full fall precautions use walker/cane & assistance as needed   Disposition SNF   Diet:   Heart Healthy   with feeding  assistance and aspiration precautions.  For Heart failure patients - Check your Weight same time everyday, if you gain over 2 pounds, or you develop in leg swelling, experience more shortness of breath or chest pain, call your Primary MD immediately. Follow Cardiac Low Salt Diet and 1.5 lit/day fluid restriction.   On your next visit with your primary care physician please Get Medicines reviewed and adjusted.   Please request your Prim.MD to go over all Hospital Tests and Procedure/Radiological results at the follow up, please get all Hospital records sent to your Prim MD by signing hospital release before you go home.   If you experience worsening of your admission symptoms, develop shortness of breath, life threatening emergency, suicidal or homicidal thoughts you must seek medical attention immediately by calling 911 or calling your MD immediately  if symptoms less severe.  You Must read complete instructions/literature along with all  the possible adverse reactions/side effects for all the Medicines you take and that have been prescribed to you. Take any new Medicines after you have completely understood and accpet all the possible adverse reactions/side effects.   Do not drive, operating heavy machinery, perform activities at heights, swimming or participation in water activities or provide baby sitting services if your were admitted for syncope or siezures until you have seen by Primary MD or a Neurologist and advised to do so again.  Do not drive when taking Pain medications.    Do not take more than prescribed Pain, Sleep and Anxiety Medications  Special Instructions: If you have smoked or chewed Tobacco  in the last 2 yrs please stop smoking, stop any regular Alcohol  and or any Recreational drug use.  Wear Seat belts while driving.   Please note  You were cared for by a hospitalist during your hospital stay. If you have any questions about your discharge medications or the care  you received while you were in the hospital after you are discharged, you can call the unit and asked to speak with the hospitalist on call if the hospitalist that took care of you is not available. Once you are discharged, your primary care physician will handle any further medical issues. Please note that NO REFILLS for any discharge medications will be authorized once you are discharged, as it is imperative that you return to your primary care physician (or establish a relationship with a primary care physician if you do not have one) for your aftercare needs so that they can reassess your need for medications and monitor your lab values.     Increase activity slowly    Complete by:  As directed              Discharge Medications       Medication List    TAKE these medications        amLODipine 5 MG tablet  Commonly known as:  NORVASC  Take 5 mg by mouth daily.     ascorbic acid 500 MG tablet  Commonly known as:  VITAMIN C  Take 1 tablet (500 mg total) by mouth 2 (two) times daily.     cefpodoxime 200 MG tablet  Commonly known as:  VANTIN  Take 1 tablet (200 mg total) by mouth every 12 (twelve) hours.     CERTAGEN tablet  Take 1 tablet by mouth daily.     donepezil 10 MG tablet  Commonly known as:  ARICEPT  Take 1 tablet (10 mg total) by mouth at bedtime.     feeding supplement (PRO-STAT SUGAR FREE 64) Liqd  Take 30 mLs by mouth 3 (three) times daily with meals.     feeding supplement Liqd  Take 1 Container by mouth 3 (three) times daily between meals.     furosemide 20 MG tablet  Commonly known as:  LASIX  TAKE 1 TABLET (20 MG TOTAL) BY MOUTH DAILY.     losartan 50 MG tablet  Commonly known as:  COZAAR  TAKE 1 TABLET BY MOUTH DAILY     megestrol 40 MG/ML suspension  Commonly known as:  MEGACE ORAL  One tsp po bid     metoprolol succinate 25 MG 24 hr tablet  Commonly known as:  TOPROL-XL  Take 1 tablet (25 mg total) by mouth daily.     metroNIDAZOLE 500 MG  tablet  Commonly known as:  FLAGYL  Take 1 tablet (500 mg total) by  mouth every 8 (eight) hours.     vitamin B-12 500 MCG tablet  Commonly known as:  CYANOCOBALAMIN  Take 500 mcg by mouth daily.        Major procedures and Radiology Reports - PLEASE review detailed and final reports for all details, in brief -       Mr Pelvis W Wo Contrast  04/30/2015  CLINICAL DATA:  Stage IV sacral decubitus ulcer with fever and elevated white blood count. Osteomyelitis of the sacrum. EXAM: MRI PELVIS WITHOUT AND WITH CONTRAST TECHNIQUE: Multiplanar multisequence MR imaging of the pelvis was performed both before and after administration of intravenous contrast. CONTRAST:  14m MULTIHANCE GADOBENATE DIMEGLUMINE 529 MG/ML IV SOLN COMPARISON:  MRI dated 11/15/2014 and CT scan dated 11/15/2014 FINDINGS: There is a midline sacral decubitus ulcer. The ulcer extends just superficial to the distal sacrum. There is either been resection or destruction of the fourth and fifth sacral segments and a partial resection of the proximal coccyx since the prior exam. There is new enhancement of the gluteus maximus muscles adjacent to tear sacral attachments and just medial and lateral to the sacral decubitus ulcer, consistent with myositis. There is no definable abscess or current osteomyelitis. Ostomy is noted in the left lower quadrant. There is no adenopathy or mass lesion. There is atrophy of the muscles of the hips in both proximal thighs. Right hip prosthesis noted. IMPRESSION: 1. No evidence of current osteomyelitis or soft tissue abscess. 2. Deep sacral decubitus ulcer with adjacent focal myositis of the gluteus maximus muscles. I Electronically Signed   By: JLorriane ShireM.D.   On: 04/30/2015 10:16   Dg Chest Port 1 View  05/15/2015  CLINICAL DATA:  Shortness of breath. EXAM: PORTABLE CHEST 1 VIEW COMPARISON:  04/29/2015 FINDINGS: Patient is rotated towards the right. The aorta is tortuous and mildly calcified. Heart  size is accentuated by the portable technique. The lungs are free of focal consolidations and pleural effusions. Mild mid thoracic spondylosis noted. IMPRESSION: No evidence for acute cardiopulmonary abnormality. Electronically Signed   By: ENolon NationsM.D.   On: 05/15/2015 08:50   Dg Chest Port 1 View  04/29/2015  CLINICAL DATA:  81year old female with fever and wound infection EXAM: PORTABLE CHEST 1 VIEW COMPARISON:  Prior chest x-ray 11/24/2014 FINDINGS: Low inspiratory volumes with mild bibasilar atelectasis. Stable cardiac and mediastinal contours. Atherosclerotic calcifications throughout the thoracic aorta. Stable mild chronic bronchitic change. No pulmonary edema, focal consolidation or pneumothorax. No acute osseous abnormality. IMPRESSION: 1. Low inspiratory volumes with mild bibasilar atelectasis. 2. Otherwise, stable chest x-ray without acute cardiopulmonary process. Electronically Signed   By: HJacqulynn CadetM.D.   On: 04/29/2015 16:44    Micro Results      Recent Results (from the past 240 hour(s))  Culture, blood (routine x 2)     Status: None   Collection Time: 05/14/15  7:41 PM  Result Value Ref Range Status   Specimen Description BLOOD RIGHT HAND  Final   Special Requests IN PEDIATRIC BOTTLE 1[place]rent Final   Culture NO GROWTH 5 DAYS  Final   Report Status 05/19/2015 FINAL  Final       Today   Subjective    SAlmetta Lovelytoday has no headache,no chest abdominal pain,no new weakness tingling or numbness, feels much better .   Objective   Blood pressure 154/87, pulse 88, temperature 99 F (37.2 C), temperature source Oral, resp. rate 18, height '5\' 7"'$  (1.702 m), weight 62.4 kg (  137 lb 9.1 oz), SpO2 100 %.   Intake/Output Summary (Last 24 hours) at 05/20/15 1034 Last data filed at 05/20/15 0558  Gross per 24 hour  Intake 1607.5 ml  Output    550 ml  Net 1057.5 ml    Exam Awake Alert, Oriented x 3, No new F.N deficits, Normal  affect [place].AT,PERRAL Supple Neck,No JVD, No cervical lymphadenopathy appriciated.  Symmetrical Chest wall movement, Good air movement bilaterally, CTAB RRR,No Gallops,Rubs or new Murmurs, No Parasternal Heave +ve B.Sounds, Abd Soft, Non tender, No organomegaly appriciated, No rebound -guarding or rigidity. Colostomy site stable, No Cyanosis, Clubbing or edema, as a morbilliform rash on her trunk and lower extremities non-itching and nonprogressive,   Data Review   CBC w Diff: Lab Results  Component Value Date   WBC 24.6* 05/20/2015   HGB 13.0 05/20/2015   HCT 38.7 05/20/2015   PLT 404* 05/20/2015   LYMPHOPCT 7 05/14/2015   MONOPCT 6 05/14/2015   EOSPCT 4 05/14/2015   BASOPCT 0 05/14/2015    CMP: Lab Results  Component Value Date   NA 145 05/19/2015   K 4.0 05/19/2015   CL 109 05/19/2015   CO2 25 05/19/2015   BUN 12 05/19/2015   CREATININE 0.67 05/19/2015   CREATININE 0.70 01/13/2015   PROT 6.9 05/14/2015   ALBUMIN 2.7* 05/14/2015   BILITOT 0.4 05/14/2015   ALKPHOS 66 05/14/2015   AST 22 05/14/2015   ALT 15 05/14/2015  .   Total Time in preparing paper work, data evaluation and todays exam - 35 minutes  Thurnell Lose M.D on 05/20/2015 at 10:34 AM  [facility]   Office  [phone]

## 2015-05-20 NOTE — Progress Notes (Signed)
Physical Therapy Wound Treatment Patient Details  Name: DEBBRAH SAMPEDRO MRN: [id number] Date of Birth: March 22, 1934  Today's Date: 05/20/2015 Time: 0037-0488 Time Calculation (min): 34 min  Subjective  Subjective: Pt agreeable but mostly non-verbal during session.  Patient and Family Stated Goals: Pt didn't state Date of Onset:  (Prior to admission) Prior Treatments: Dressing changes, VAC  Pain Score:  Pt did not appear to be in pain or distress throughout session.   Wound Assessment  Wound / Incision (Open or Dehisced) 04/30/15 Other (Comment) Sacrum Medial Stage IV approximate quarter size (Active)  Dressing Type Foam;Barrier Film (skin prep);Moist to dry 05/20/2015 12:41 PM  Dressing Changed Changed 05/20/2015 12:41 PM  Dressing Status Clean;Dry;Intact 05/20/2015 12:41 PM  Dressing Change Frequency Daily 05/20/2015 12:41 PM  Site / Wound Assessment Red;Yellow;Brown 05/20/2015 12:41 PM  % Wound base Red or Granulating 70% 05/20/2015 12:41 PM  % Wound base Yellow 25% 05/20/2015 12:41 PM  % Wound base Black 5% 05/20/2015 12:41 PM  % Wound base Other (Comment) 0% 05/20/2015 12:41 PM  Peri-wound Assessment Denuded;Maceration 05/20/2015 12:41 PM  Wound Length (cm) 5 cm 05/16/2015  8:52 AM  Wound Width (cm) 7.5 cm 05/16/2015  8:52 AM  Wound Depth (cm) 2.7 cm 05/16/2015  8:52 AM  Undermining (cm) 12 to 3 o'clock up to 3cm 05/16/2015  8:52 AM  Margins Unattached edges (unapproximated) 05/20/2015 12:41 PM  Closure None 05/20/2015 12:41 PM  Drainage Amount Minimal 05/20/2015 12:41 PM  Drainage Description Serosanguineous 05/20/2015 12:41 PM  Non-staged Wound Description Not applicable 10/21/1692 50:38 PM  Treatment Debridement (Selective);Hydrotherapy (Pulse lavage);Packing (Saline gauze) 05/20/2015 12:41 PM  Santyl applied to wound bed prior to applying dressing.   Hydrotherapy Pulsed lavage therapy - wound location: sacrum Pulsed Lavage with Suction (psi): 8 psi Pulsed Lavage with Suction - Normal Saline Used: 1000  mL Pulsed Lavage Tip: Tip with splash shield Selective Debridement Selective Debridement - Location: sacrum Selective Debridement - Tools Used: Forceps;Scissors Selective Debridement - Tissue Removed: Yellow and brown necrotic tissue   Wound Assessment and Plan  Wound Therapy - Assess/Plan/Recommendations Wound Therapy - Clinical Statement: Steady progress with removal of necrotic tissue.  Wound Therapy - Functional Problem List: Decr time out of bed Factors Delaying/Impairing Wound Healing: Immobility;Polypharmacy;Multiple medical problems Hydrotherapy Plan: Debridement;Dressing change;Patient/family education;Pulsatile lavage with suction Wound Therapy - Frequency: 6X / week Wound Therapy - Follow Up Recommendations: Skilled nursing facility Wound Plan: See above  Wound Therapy Goals- Improve the function of patient's integumentary system by progressing the wound(s) through the phases of wound healing (inflammation - proliferation - remodeling) by: Decrease Necrotic Tissue to: 30% Decrease Necrotic Tissue - Progress: Progressing toward goal Increase Granulation Tissue to: 70% Increase Granulation Tissue - Progress: Progressing toward goal Goals/treatment plan/discharge plan were made with and agreed upon by patient/family: No, Patient unable to participate in goals/treatment/discharge plan and family unavailable Time For Goal Achievement: 7 days Wound Therapy - Potential for Goals: Good  Goals will be updated until maximal potential achieved or discharge criteria met.  Discharge criteria: when goals achieved, discharge from hospital, MD decision/surgical intervention, no progress towards goals, refusal/missing three consecutive treatments without notification or medical reason.  GP     Rolinda Roan 05/20/2015, 12:52 PM  Rolinda Roan, PT, DPT Acute Rehabilitation Services Pager: [phone]

## 2015-05-20 NOTE — Clinical Social Work Note (Signed)
Clinical Social Worker facilitated patient discharge including contacting patient family and facility to confirm patient discharge plans.  Clinical information faxed to facility and family agreeable with plan.  CSW arranged ambulance transport via [facility] to [facility] .  RN to call report prior to discharge.  Clinical Social Worker will sign off for now as social work intervention is no longer needed. Please consult us again if new need arises.  Jesse Clarrisa Kaylor, LCSW [phone] 

## 2015-05-20 NOTE — Progress Notes (Signed)
Nutrition Follow-up  DOCUMENTATION CODES:   Non-severe (moderate) malnutrition in context of chronic illness  INTERVENTION:  -Continue Prostat TID, 100 kcal, 15 grams of protein per 30 ml -Continue Boost Breeze TID, 250 kcal, 9 grams of protein per serving -Continue MVI daily  NUTRITION DIAGNOSIS:   Increased nutrient needs related to wound healing as evidenced by estimated needs.  -Ongoing  GOAL:   Patient will meet greater than or equal to 90% of their needs  -Progressing  MONITOR:   PO intake, Supplement acceptance, Weight trends, Skin, Labs  ASSESSMENT:   Pt with history of dementia, HTN, stage IV decub ulcer who was admitted recently for wound infection   No family present at bedside. Spoke with pt's RN. RN reports pt will not eat for her but pt eats well when family is present. Pt will not take Prostat supplement, RN reports trying to mix with juices and applesauce to improve acceptance but pt still refuses. Per chart pt completes 30-100% of meals. Continue with oral nutritional supplements to help meet nutritional needs.    Pt has plans to D/C today to SNF. Palliative Care NP confirmed this and pt will be followed by hospice at discharge.  PT is following pt, pt has been receiving hydrotherapy since 3/3.  Medications reviewed; Multivitamin 1 tablet daily, cyanocobalamin (vitamin B12) 500 mcg daily, ascorbic acid (vitamin C) 500 mg BID Labs reviewed.   Diet Order:  Diet regular Room service appropriate?: Yes; Fluid consistency:: Thin  Skin:  Wound (see comment) (Stage IV sacrum wound)  Last BM:  05/19/2015  Height:   Ht Readings from Last 1 Encounters:  05/14/15 5\' 7"  (1.702 m)    Weight:   Wt Readings from Last 1 Encounters:  05/14/15 137 lb 9.1 oz (62.4 kg)    Ideal Body Weight:  61.4 kg  BMI:  Body mass index is 21.54 kg/(m^2).  Estimated Nutritional Needs:   Kcal:  1800-2000  Protein:  90-105 grams  Fluid:  1.8-2 L  EDUCATION NEEDS:   No  education needs identified at this time  SwedenBrittany Damira Kem, Dietetic Intern Pager: [phone]

## 2015-05-21 ENCOUNTER — Telehealth: Payer: Self-pay

## 2015-05-21 DIAGNOSIS — E43 Unspecified severe protein-calorie malnutrition: Secondary | ICD-10-CM | POA: Diagnosis not present

## 2015-05-21 DIAGNOSIS — R627 Adult failure to thrive: Secondary | ICD-10-CM | POA: Diagnosis not present

## 2015-05-21 DIAGNOSIS — L89154 Pressure ulcer of sacral region, stage 4: Secondary | ICD-10-CM | POA: Diagnosis not present

## 2015-05-21 DIAGNOSIS — I10 Essential (primary) hypertension: Secondary | ICD-10-CM | POA: Diagnosis not present

## 2015-05-21 NOTE — Telephone Encounter (Signed)
First attempt for TCM  

## 2015-05-22 DIAGNOSIS — R627 Adult failure to thrive: Secondary | ICD-10-CM | POA: Diagnosis not present

## 2015-05-22 DIAGNOSIS — L89159 Pressure ulcer of sacral region, unspecified stage: Secondary | ICD-10-CM | POA: Diagnosis not present

## 2015-05-22 DIAGNOSIS — A419 Sepsis, unspecified organism: Secondary | ICD-10-CM | POA: Diagnosis not present

## 2015-05-23 ENCOUNTER — Ambulatory Visit: Payer: Medicare Other | Admitting: Family Medicine

## 2015-05-23 DIAGNOSIS — A419 Sepsis, unspecified organism: Secondary | ICD-10-CM | POA: Diagnosis not present

## 2015-05-23 DIAGNOSIS — R627 Adult failure to thrive: Secondary | ICD-10-CM | POA: Diagnosis not present

## 2015-05-23 DIAGNOSIS — L89159 Pressure ulcer of sacral region, unspecified stage: Secondary | ICD-10-CM | POA: Diagnosis not present

## 2015-05-26 DIAGNOSIS — L89159 Pressure ulcer of sacral region, unspecified stage: Secondary | ICD-10-CM | POA: Diagnosis not present

## 2015-05-26 DIAGNOSIS — I10 Essential (primary) hypertension: Secondary | ICD-10-CM | POA: Diagnosis not present

## 2015-05-26 DIAGNOSIS — A419 Sepsis, unspecified organism: Secondary | ICD-10-CM | POA: Diagnosis not present

## 2015-05-30 ENCOUNTER — Telehealth: Payer: Self-pay | Admitting: Family Medicine

## 2015-05-30 MED ORDER — TRIAMCINOLONE ACETONIDE 0.1 % EX CREA: 1.0000 "application " | TOPICAL_CREAM | Freq: Two times a day (BID) | CUTANEOUS | Status: DC

## 2015-05-30 NOTE — Telephone Encounter (Signed)
Pt is at [facility] and need new rx for silvermed gel for itching.[facility] phone is [phone]

## 2015-05-30 NOTE — Telephone Encounter (Signed)
Medication faxed to rehab.

## 2015-06-04 DIAGNOSIS — E440 Moderate protein-calorie malnutrition: Secondary | ICD-10-CM | POA: Diagnosis not present

## 2015-06-04 DIAGNOSIS — M1990 Unspecified osteoarthritis, unspecified site: Secondary | ICD-10-CM | POA: Diagnosis not present

## 2015-06-04 DIAGNOSIS — Z8614 Personal history of Methicillin resistant Staphylococcus aureus infection: Secondary | ICD-10-CM | POA: Diagnosis not present

## 2015-06-04 DIAGNOSIS — Z96649 Presence of unspecified artificial hip joint: Secondary | ICD-10-CM | POA: Diagnosis not present

## 2015-06-04 DIAGNOSIS — L89154 Pressure ulcer of sacral region, stage 4: Secondary | ICD-10-CM | POA: Diagnosis not present

## 2015-06-04 DIAGNOSIS — Z933 Colostomy status: Secondary | ICD-10-CM | POA: Diagnosis not present

## 2015-06-04 DIAGNOSIS — I96 Gangrene, not elsewhere classified: Secondary | ICD-10-CM | POA: Diagnosis not present

## 2015-06-04 DIAGNOSIS — M8638 Chronic multifocal osteomyelitis, other site: Secondary | ICD-10-CM | POA: Diagnosis not present

## 2015-06-18 DIAGNOSIS — L89159 Pressure ulcer of sacral region, unspecified stage: Secondary | ICD-10-CM | POA: Diagnosis not present

## 2015-06-18 DIAGNOSIS — R21 Rash and other nonspecific skin eruption: Secondary | ICD-10-CM | POA: Diagnosis not present

## 2015-06-18 DIAGNOSIS — I10 Essential (primary) hypertension: Secondary | ICD-10-CM | POA: Diagnosis not present

## 2015-06-18 DIAGNOSIS — F0390 Unspecified dementia without behavioral disturbance: Secondary | ICD-10-CM | POA: Diagnosis not present

## 2015-07-15 DIAGNOSIS — R627 Adult failure to thrive: Secondary | ICD-10-CM | POA: Diagnosis not present

## 2015-07-15 DIAGNOSIS — F0390 Unspecified dementia without behavioral disturbance: Secondary | ICD-10-CM | POA: Diagnosis not present

## 2015-07-15 DIAGNOSIS — L89159 Pressure ulcer of sacral region, unspecified stage: Secondary | ICD-10-CM | POA: Diagnosis not present

## 2015-07-15 DIAGNOSIS — I10 Essential (primary) hypertension: Secondary | ICD-10-CM | POA: Diagnosis not present

## 2015-07-21 DIAGNOSIS — H25813 Combined forms of age-related cataract, bilateral: Secondary | ICD-10-CM | POA: Diagnosis not present

## 2015-07-24 DIAGNOSIS — L309 Dermatitis, unspecified: Secondary | ICD-10-CM | POA: Diagnosis not present

## 2015-08-08 DIAGNOSIS — R627 Adult failure to thrive: Secondary | ICD-10-CM | POA: Diagnosis not present

## 2015-08-08 DIAGNOSIS — I10 Essential (primary) hypertension: Secondary | ICD-10-CM | POA: Diagnosis not present

## 2015-08-08 DIAGNOSIS — E43 Unspecified severe protein-calorie malnutrition: Secondary | ICD-10-CM | POA: Diagnosis not present

## 2015-08-08 DIAGNOSIS — L89154 Pressure ulcer of sacral region, stage 4: Secondary | ICD-10-CM | POA: Diagnosis not present

## 2015-08-09 DIAGNOSIS — M2550 Pain in unspecified joint: Secondary | ICD-10-CM | POA: Diagnosis not present

## 2015-08-09 DIAGNOSIS — E559 Vitamin D deficiency, unspecified: Secondary | ICD-10-CM | POA: Diagnosis not present

## 2015-08-09 DIAGNOSIS — I10 Essential (primary) hypertension: Secondary | ICD-10-CM | POA: Diagnosis not present

## 2015-11-06 IMAGING — US IR FLUORO GUIDE CV LINE*R*
1 series · 1 of 1 positions shown · non-contrast
Comparison: none

CLINICAL DATA: Infection, needs venous access for antibiotics.
Recent access was inadvertently lost.

EXAM:
PICC PLACEMENT WITH ULTRASOUND AND FLUOROSCOPY
FLUOROSCOPY TIME:  0.1 minutes, 10.42 uBymZ DAP
TECHNIQUE: After written informed consent was obtained, patient was placed in
the supine position on angiographic table. Patency of the right
basilic vein was confirmed with ultrasound with image documentation.
An appropriate skin site was determined. Skin site was marked.
Region was prepped using maximum barrier technique including cap and
mask, sterile gown, sterile gloves, large sterile sheet, and
Chlorhexidine as cutaneous antisepsis. The region was infiltrated
locally with 1% lidocaine. Under real-time ultrasound guidance, the
right basilic vein was accessed with a 21 gauge micropuncture
needle; the needle tip within the vein was confirmed with ultrasound
image documentation. Needle exchanged over a 018 guidewire for a
peel-away sheath, through which a 5-French single-lumen power
injectable PICC trimmed to 40cm was advanced, positioned with its
tip near the cavoatrial junction. Spot chest radiograph confirms
appropriate catheter position. Catheter was flushed per protocol and
secured externally. The patient tolerated procedure well.
COMPLICATIONS:
COMPLICATIONS
none

[Series 1: ir fluoro/shunt/fist · 1 of 1 slices shown]
[im 1/1]
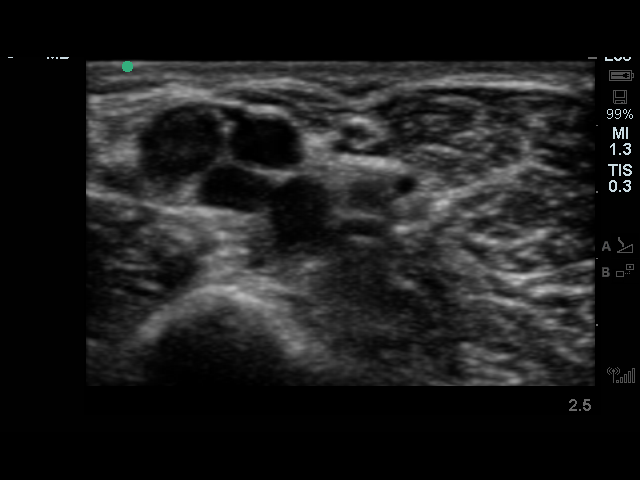

[1 of 1 positions shown; findings below may reference images not displayed]

IMPRESSION: 1. Technically successful five French single lumen power injectable
PICC placement

## 2015-12-17 ENCOUNTER — Encounter (HOSPITAL_BASED_OUTPATIENT_CLINIC_OR_DEPARTMENT_OTHER): Payer: Medicare Other | Attending: Surgery

## 2015-12-17 DIAGNOSIS — L89154 Pressure ulcer of sacral region, stage 4: Secondary | ICD-10-CM | POA: Insufficient documentation

## 2015-12-17 DIAGNOSIS — L89620 Pressure ulcer of left heel, unstageable: Secondary | ICD-10-CM | POA: Diagnosis not present

## 2015-12-17 DIAGNOSIS — F0390 Unspecified dementia without behavioral disturbance: Secondary | ICD-10-CM | POA: Diagnosis not present

## 2015-12-17 DIAGNOSIS — Z96649 Presence of unspecified artificial hip joint: Secondary | ICD-10-CM | POA: Diagnosis not present

## 2015-12-17 DIAGNOSIS — Z933 Colostomy status: Secondary | ICD-10-CM | POA: Diagnosis not present

## 2015-12-30 DIAGNOSIS — L89154 Pressure ulcer of sacral region, stage 4: Secondary | ICD-10-CM | POA: Diagnosis not present

## 2016-01-13 DIAGNOSIS — L89154 Pressure ulcer of sacral region, stage 4: Secondary | ICD-10-CM | POA: Diagnosis not present

## 2016-01-27 ENCOUNTER — Encounter (HOSPITAL_BASED_OUTPATIENT_CLINIC_OR_DEPARTMENT_OTHER): Payer: Medicare Other | Attending: Surgery

## 2016-01-27 DIAGNOSIS — L89154 Pressure ulcer of sacral region, stage 4: Secondary | ICD-10-CM | POA: Insufficient documentation

## 2016-01-27 DIAGNOSIS — F0390 Unspecified dementia without behavioral disturbance: Secondary | ICD-10-CM | POA: Insufficient documentation

## 2016-01-27 DIAGNOSIS — I10 Essential (primary) hypertension: Secondary | ICD-10-CM | POA: Diagnosis not present

## 2016-02-10 DIAGNOSIS — L89154 Pressure ulcer of sacral region, stage 4: Secondary | ICD-10-CM | POA: Diagnosis not present

## 2016-02-24 ENCOUNTER — Encounter (HOSPITAL_BASED_OUTPATIENT_CLINIC_OR_DEPARTMENT_OTHER): Payer: Medicare Other | Attending: Surgery

## 2016-02-24 DIAGNOSIS — L89154 Pressure ulcer of sacral region, stage 4: Secondary | ICD-10-CM | POA: Diagnosis present

## 2016-02-24 DIAGNOSIS — F0390 Unspecified dementia without behavioral disturbance: Secondary | ICD-10-CM | POA: Insufficient documentation

## 2016-02-24 DIAGNOSIS — I10 Essential (primary) hypertension: Secondary | ICD-10-CM | POA: Insufficient documentation

## 2016-03-09 DIAGNOSIS — L89154 Pressure ulcer of sacral region, stage 4: Secondary | ICD-10-CM | POA: Diagnosis not present

## 2016-03-23 ENCOUNTER — Encounter (HOSPITAL_BASED_OUTPATIENT_CLINIC_OR_DEPARTMENT_OTHER): Payer: Medicare Other | Attending: Surgery

## 2016-03-23 DIAGNOSIS — F0390 Unspecified dementia without behavioral disturbance: Secondary | ICD-10-CM | POA: Insufficient documentation

## 2016-03-23 DIAGNOSIS — L89154 Pressure ulcer of sacral region, stage 4: Secondary | ICD-10-CM | POA: Insufficient documentation

## 2016-03-23 DIAGNOSIS — I10 Essential (primary) hypertension: Secondary | ICD-10-CM | POA: Insufficient documentation

## 2016-04-06 DIAGNOSIS — L89154 Pressure ulcer of sacral region, stage 4: Secondary | ICD-10-CM | POA: Diagnosis not present

## 2016-04-20 ENCOUNTER — Encounter (HOSPITAL_BASED_OUTPATIENT_CLINIC_OR_DEPARTMENT_OTHER): Payer: Medicare Other | Attending: Surgery

## 2016-04-20 DIAGNOSIS — F0390 Unspecified dementia without behavioral disturbance: Secondary | ICD-10-CM | POA: Insufficient documentation

## 2016-04-20 DIAGNOSIS — I10 Essential (primary) hypertension: Secondary | ICD-10-CM | POA: Insufficient documentation

## 2016-04-20 DIAGNOSIS — L89154 Pressure ulcer of sacral region, stage 4: Secondary | ICD-10-CM | POA: Insufficient documentation

## 2016-04-27 DIAGNOSIS — I10 Essential (primary) hypertension: Secondary | ICD-10-CM | POA: Diagnosis not present

## 2016-04-27 DIAGNOSIS — L89154 Pressure ulcer of sacral region, stage 4: Secondary | ICD-10-CM | POA: Diagnosis not present

## 2016-04-27 DIAGNOSIS — F0390 Unspecified dementia without behavioral disturbance: Secondary | ICD-10-CM | POA: Diagnosis not present

## 2016-05-11 DIAGNOSIS — L89154 Pressure ulcer of sacral region, stage 4: Secondary | ICD-10-CM | POA: Diagnosis not present

## 2016-05-25 ENCOUNTER — Encounter (HOSPITAL_BASED_OUTPATIENT_CLINIC_OR_DEPARTMENT_OTHER): Payer: Medicare Other | Attending: Surgery

## 2016-05-25 DIAGNOSIS — I10 Essential (primary) hypertension: Secondary | ICD-10-CM | POA: Insufficient documentation

## 2016-05-25 DIAGNOSIS — F0390 Unspecified dementia without behavioral disturbance: Secondary | ICD-10-CM | POA: Diagnosis not present

## 2016-05-25 DIAGNOSIS — L89154 Pressure ulcer of sacral region, stage 4: Secondary | ICD-10-CM | POA: Diagnosis not present

## 2016-05-25 DIAGNOSIS — M868X8 Other osteomyelitis, other site: Secondary | ICD-10-CM | POA: Insufficient documentation

## 2016-05-25 DIAGNOSIS — L89620 Pressure ulcer of left heel, unstageable: Secondary | ICD-10-CM | POA: Diagnosis not present

## 2016-06-08 DIAGNOSIS — L89154 Pressure ulcer of sacral region, stage 4: Secondary | ICD-10-CM | POA: Diagnosis not present

## 2016-06-22 ENCOUNTER — Encounter (HOSPITAL_BASED_OUTPATIENT_CLINIC_OR_DEPARTMENT_OTHER): Payer: Medicare Other | Attending: Surgery

## 2016-06-22 DIAGNOSIS — L89154 Pressure ulcer of sacral region, stage 4: Secondary | ICD-10-CM | POA: Insufficient documentation

## 2016-06-22 DIAGNOSIS — F0390 Unspecified dementia without behavioral disturbance: Secondary | ICD-10-CM | POA: Insufficient documentation

## 2016-06-22 DIAGNOSIS — Z96649 Presence of unspecified artificial hip joint: Secondary | ICD-10-CM | POA: Insufficient documentation

## 2016-06-22 DIAGNOSIS — I10 Essential (primary) hypertension: Secondary | ICD-10-CM | POA: Diagnosis not present

## 2016-06-28 ENCOUNTER — Telehealth: Payer: Self-pay | Admitting: Family Medicine

## 2016-06-28 NOTE — Telephone Encounter (Signed)
Since she is in a skilled facility, she will have to be seen by the physicians there.

## 2016-06-28 NOTE — Telephone Encounter (Signed)
Can you please advise Mr.Janee Morn?

## 2016-06-28 NOTE — Telephone Encounter (Signed)
[name] Frank would like for Dr. Caryl Never to see [name] Frank for a check up since it's been over a year and Mr. Nesheim thinks that she needs to be seen by her doctor and not their doctors. He said that they just aren't treating her right and he has asked to bring her to her doctor and he was told that he couldn't take her to another doctor besides the one that they have there at the nursing home.

## 2016-06-29 NOTE — Telephone Encounter (Signed)
Mr Larios is aware

## 2016-07-06 DIAGNOSIS — L89154 Pressure ulcer of sacral region, stage 4: Secondary | ICD-10-CM | POA: Diagnosis not present

## 2016-07-20 ENCOUNTER — Encounter (HOSPITAL_BASED_OUTPATIENT_CLINIC_OR_DEPARTMENT_OTHER): Payer: Medicare Other | Attending: Surgery

## 2016-07-20 DIAGNOSIS — L89154 Pressure ulcer of sacral region, stage 4: Secondary | ICD-10-CM | POA: Diagnosis not present

## 2016-07-20 DIAGNOSIS — F0390 Unspecified dementia without behavioral disturbance: Secondary | ICD-10-CM | POA: Diagnosis not present

## 2016-07-20 DIAGNOSIS — M869 Osteomyelitis, unspecified: Secondary | ICD-10-CM | POA: Diagnosis not present

## 2016-07-20 DIAGNOSIS — M1990 Unspecified osteoarthritis, unspecified site: Secondary | ICD-10-CM | POA: Insufficient documentation

## 2016-07-20 DIAGNOSIS — I10 Essential (primary) hypertension: Secondary | ICD-10-CM | POA: Diagnosis not present

## 2016-07-20 DIAGNOSIS — L89620 Pressure ulcer of left heel, unstageable: Secondary | ICD-10-CM | POA: Diagnosis not present

## 2016-08-03 DIAGNOSIS — L89154 Pressure ulcer of sacral region, stage 4: Secondary | ICD-10-CM | POA: Diagnosis not present

## 2016-08-24 ENCOUNTER — Encounter (HOSPITAL_BASED_OUTPATIENT_CLINIC_OR_DEPARTMENT_OTHER): Payer: Medicare Other | Attending: Surgery

## 2016-08-24 DIAGNOSIS — F0390 Unspecified dementia without behavioral disturbance: Secondary | ICD-10-CM | POA: Insufficient documentation

## 2016-08-24 DIAGNOSIS — I10 Essential (primary) hypertension: Secondary | ICD-10-CM | POA: Diagnosis not present

## 2016-08-24 DIAGNOSIS — L89154 Pressure ulcer of sacral region, stage 4: Secondary | ICD-10-CM | POA: Diagnosis not present

## 2016-09-14 ENCOUNTER — Encounter (HOSPITAL_BASED_OUTPATIENT_CLINIC_OR_DEPARTMENT_OTHER): Payer: Medicare Other | Attending: Surgery

## 2016-09-14 DIAGNOSIS — L89154 Pressure ulcer of sacral region, stage 4: Secondary | ICD-10-CM | POA: Insufficient documentation

## 2016-09-14 DIAGNOSIS — F0390 Unspecified dementia without behavioral disturbance: Secondary | ICD-10-CM | POA: Diagnosis not present

## 2016-09-14 DIAGNOSIS — I10 Essential (primary) hypertension: Secondary | ICD-10-CM | POA: Insufficient documentation

## 2016-10-05 DIAGNOSIS — L89154 Pressure ulcer of sacral region, stage 4: Secondary | ICD-10-CM | POA: Diagnosis not present

## 2016-10-26 ENCOUNTER — Encounter (HOSPITAL_BASED_OUTPATIENT_CLINIC_OR_DEPARTMENT_OTHER): Payer: Medicare Other | Attending: Surgery

## 2016-10-26 DIAGNOSIS — I10 Essential (primary) hypertension: Secondary | ICD-10-CM | POA: Diagnosis not present

## 2016-10-26 DIAGNOSIS — L89154 Pressure ulcer of sacral region, stage 4: Secondary | ICD-10-CM | POA: Insufficient documentation

## 2016-10-26 DIAGNOSIS — F0390 Unspecified dementia without behavioral disturbance: Secondary | ICD-10-CM | POA: Insufficient documentation

## 2016-11-16 ENCOUNTER — Encounter (HOSPITAL_BASED_OUTPATIENT_CLINIC_OR_DEPARTMENT_OTHER): Payer: Medicare Other | Attending: Surgery

## 2016-11-16 DIAGNOSIS — F0390 Unspecified dementia without behavioral disturbance: Secondary | ICD-10-CM | POA: Diagnosis not present

## 2016-11-16 DIAGNOSIS — Z96649 Presence of unspecified artificial hip joint: Secondary | ICD-10-CM | POA: Insufficient documentation

## 2016-11-16 DIAGNOSIS — L89154 Pressure ulcer of sacral region, stage 4: Secondary | ICD-10-CM | POA: Diagnosis present

## 2016-11-16 DIAGNOSIS — Z8614 Personal history of Methicillin resistant Staphylococcus aureus infection: Secondary | ICD-10-CM | POA: Diagnosis not present

## 2016-11-16 DIAGNOSIS — I10 Essential (primary) hypertension: Secondary | ICD-10-CM | POA: Insufficient documentation

## 2016-12-07 DIAGNOSIS — L89154 Pressure ulcer of sacral region, stage 4: Secondary | ICD-10-CM | POA: Diagnosis not present

## 2016-12-28 ENCOUNTER — Encounter (HOSPITAL_BASED_OUTPATIENT_CLINIC_OR_DEPARTMENT_OTHER): Payer: Medicare Other | Attending: Surgery

## 2016-12-28 DIAGNOSIS — L89154 Pressure ulcer of sacral region, stage 4: Secondary | ICD-10-CM | POA: Insufficient documentation

## 2016-12-28 DIAGNOSIS — I10 Essential (primary) hypertension: Secondary | ICD-10-CM | POA: Diagnosis not present

## 2016-12-28 DIAGNOSIS — F0390 Unspecified dementia without behavioral disturbance: Secondary | ICD-10-CM | POA: Insufficient documentation

## 2017-01-18 ENCOUNTER — Encounter (HOSPITAL_BASED_OUTPATIENT_CLINIC_OR_DEPARTMENT_OTHER): Payer: Medicare Other | Attending: Surgery

## 2017-01-18 DIAGNOSIS — I10 Essential (primary) hypertension: Secondary | ICD-10-CM | POA: Diagnosis not present

## 2017-01-18 DIAGNOSIS — F0390 Unspecified dementia without behavioral disturbance: Secondary | ICD-10-CM | POA: Insufficient documentation

## 2017-01-18 DIAGNOSIS — L89154 Pressure ulcer of sacral region, stage 4: Secondary | ICD-10-CM | POA: Insufficient documentation

## 2017-02-08 DIAGNOSIS — L89154 Pressure ulcer of sacral region, stage 4: Secondary | ICD-10-CM | POA: Diagnosis not present

## 2017-03-01 ENCOUNTER — Encounter (HOSPITAL_BASED_OUTPATIENT_CLINIC_OR_DEPARTMENT_OTHER): Payer: Medicare Other | Attending: Surgery

## 2017-03-01 DIAGNOSIS — I10 Essential (primary) hypertension: Secondary | ICD-10-CM | POA: Diagnosis not present

## 2017-03-01 DIAGNOSIS — F0390 Unspecified dementia without behavioral disturbance: Secondary | ICD-10-CM | POA: Diagnosis not present

## 2017-03-01 DIAGNOSIS — L89154 Pressure ulcer of sacral region, stage 4: Secondary | ICD-10-CM | POA: Diagnosis present

## 2017-03-22 ENCOUNTER — Encounter (HOSPITAL_BASED_OUTPATIENT_CLINIC_OR_DEPARTMENT_OTHER): Payer: Medicare Other | Attending: Internal Medicine

## 2017-03-22 DIAGNOSIS — L89154 Pressure ulcer of sacral region, stage 4: Secondary | ICD-10-CM | POA: Diagnosis not present

## 2017-03-22 DIAGNOSIS — I10 Essential (primary) hypertension: Secondary | ICD-10-CM | POA: Insufficient documentation

## 2017-03-22 DIAGNOSIS — F0390 Unspecified dementia without behavioral disturbance: Secondary | ICD-10-CM | POA: Insufficient documentation

## 2017-03-23 ENCOUNTER — Ambulatory Visit (INDEPENDENT_AMBULATORY_CARE_PROVIDER_SITE_OTHER)
Admission: RE | Admit: 2017-03-23 | Discharge: 2017-03-23 | Disposition: A | Discharge status: Home / Self Care | Payer: Medicare Other | Source: Ambulatory Visit | Attending: Internal Medicine | Admitting: Internal Medicine

## 2017-03-23 ENCOUNTER — Encounter: Payer: Self-pay | Admitting: Internal Medicine

## 2017-03-23 ENCOUNTER — Ambulatory Visit (INDEPENDENT_AMBULATORY_CARE_PROVIDER_SITE_OTHER): Payer: Medicare Other | Admitting: Internal Medicine

## 2017-03-23 VITALS — BP 114/78 | HR 78 | Ht 65.0 in | Wt 144.0 lb

## 2017-03-23 DIAGNOSIS — R053 Chronic cough: Secondary | ICD-10-CM

## 2017-03-23 DIAGNOSIS — R05 Cough: Secondary | ICD-10-CM | POA: Diagnosis not present

## 2017-03-23 MED ORDER — PANTOPRAZOLE SODIUM 40 MG PO TBEC: 40.0000 mg | DELAYED_RELEASE_TABLET | Freq: Every day | ORAL | 2 refills | Status: DC

## 2017-03-23 MED ORDER — FAMOTIDINE 20 MG PO TABS: ORAL_TABLET | ORAL | 2 refills | Status: AC

## 2017-03-23 NOTE — Progress Notes (Signed)
Subjective:     Patient ID: [name] Frank, female   DOB: 1935/02/04,    MRN: [id number]  HPI   [age] yobf  Quit smoking 1985  [place]   with dementia complicated by stage IV sacral decub s/p diverting colostomy with new onset cough ? Since  02/2017  referred to pulmonary clinic 03/23/2017 by Dr   [name] Frank   03/23/2017 [facility] office visit/ [name] Frank   Chief Complaint  Patient presents with  . Pulmonary Consult  cough worse p eat and hs per husband but he has very limited insight into her problems  And doesn't know whether cough is prod or not  Eats ground up food = puree - no record of last ST eval  As far as pt's husband can tell > no  obvious day to day or daytime variability or assoc excess/ purulent sputum or mucus plugs or hemoptysis or cp or chest tightness, subjective wheeze or overt sinus or hb symptoms. No unusual exposure hx or h/o childhood pna/ asthma or knowledge of premature birth.   . Also denies any obvious fluctuation of symptoms with weather or environmental changes or other aggravating or alleviating factors except as outlined above   Current Allergies, Complete Past Medical History, Past Surgical History, Family History, and Social History were reviewed in [facility] electronic medical record.  ROS  The following are not active complaints unless bolded but unsure this is accurate  Hoarseness, sore throat, dysphagia, dental problems, itching, sneezing,  nasal congestion or discharge of excess mucus or purulent secretions, ear ache,   fever, chills, sweats, unintended wt loss or wt gain, classically pleuritic or exertional cp,  orthopnea pnd or leg swelling, presyncope, palpitations, abdominal pain, anorexia, nausea, vomiting, diarrhea  or change in bowel habits or change in bladder habits, change in stools or change in urine, dysuria, hematuria,  rash, arthralgias, visual complaints, headache, numbness, weakness or ataxia or problems with walking or coordination,   change in mood/affect or memory.        Current Meds  Medication Sig  . Amino Acids-Protein Hydrolys (FEEDING SUPPLEMENT, PRO-STAT SUGAR FREE 64,) LIQD Take 30 mLs by mouth 3 (three) times daily with meals.  Marland Kitchen. amLODipine (NORVASC) 5 MG tablet Take 5 mg by mouth daily.  Marland Kitchen. atorvastatin (LIPITOR) 10 MG tablet Take 10 mg by mouth daily.  . cloNIDine (CATAPRES) 0.1 MG tablet Take 0.1 mg by mouth 2 (two) times daily.  Marland Kitchen. donepezil (ARICEPT) 10 MG tablet Take 1 tablet (10 mg total) by mouth at bedtime.  . feeding supplement (BOOST / RESOURCE BREEZE) LIQD Take 1 Container by mouth 3 (three) times daily between meals.  . fluticasone (FLONASE) 50 MCG/ACT nasal spray Place 2 sprays into both nostrils daily.  . furosemide (LASIX) 20 MG tablet TAKE 1 TABLET (20 MG TOTAL) BY MOUTH DAILY.  Marland Kitchen. ipratropium-albuterol (DUONEB) 0.5-2.5 (3) MG/3ML SOLN Take 3 mLs by nebulization every 4 (four) hours as needed.  Marland Kitchen. ketoconazole (NIZORAL) 2 % shampoo Apply 1 application topically 2 (two) times a week.  . loratadine (CLARITIN) 10 MG tablet Take 10 mg by mouth daily.  Marland Kitchen. losartan (COZAAR) 50 MG tablet TAKE 1 TABLET BY MOUTH DAILY (Patient taking differently: TAKE 50 MG BY MOUTH DAILY)  . metoprolol succinate (TOPROL-XL) 25 MG 24 hr tablet Take 1 tablet (25 mg total) by mouth daily.  . Multiple Vitamins-Minerals (CERTAGEN) tablet Take 1 tablet by mouth daily.  . vitamin C (VITAMIN C) 500 MG tablet Take  1 tablet (500 mg total) by mouth 2 (two) times daily.  . Vitamin D, Ergocalciferol, (DRISDOL) 50000 units CAPS capsule Take 50,000 Units by mouth every 7 (seven) days.  Marland Kitchen zinc sulfate 220 (50 Zn) MG capsule Take 220 mg by mouth 2 (two) times daily.  . [DISCONTINUED] omeprazole (PRILOSEC) 20 MG capsule Take 20 mg by mouth daily.         Review of Systems     Objective:   Physical Exam   W/c bound elderly bf nad sits expressionless/ does not respond verbally at all    Wt Readings from Last 3 Encounters:   03/23/17 144 lb (65.3 kg)  05/14/15 137 lb 9.1 oz (62.4 kg)  04/29/15 125 lb 10.6 oz (57 kg)     Vital signs reviewed - Note on arrival 02 sats  95% on RA      HEENT: nl dentition, turbinates bilaterally, and oropharynx. Nl external ear canals without cough reflex   NECK :  without JVD/Nodes/TM/ nl carotid upstrokes bilaterally   LUNGS: no acc muscle use,  Nl contour chest which is clear to A and P bilaterally without cough on insp or exp maneuvers   CV:  RRR  no s3 or murmur or increase in P2, and no edema   ABD:  soft and nontender with nl inspiratory excursion in the supine position. No bruits or organomegaly appreciated, bowel sounds nl  MS:    ext warm without obvious deformities, calf tenderness, cyanosis or clubbing No obvious joint restrictions   SKIN: warm and dry without lesions    NEURO: w/c bound, appears alert but not verbal. Can stick her tongue out to request/can't close up hands but can wiggle both feet and has generalized increased in muscle tone s cogwheeling           CXR PA and Lateral:   03/23/2017 :    I personally reviewed images and   impression as follows:    elevated L HD/ otherwise ok     Assessment:

## 2017-03-23 NOTE — Patient Instructions (Signed)
Please remember to go to the  x-ray department downstairs in the basement  for your tests - we will call you with the results when they are available.     Pantoprazole (protonix) 40 mg   Take  30-60 min before first meal of the day and Pepcid (famotidine)  20 mg one @  Bedtime for a month to see if helps   GERD (REFLUX)  is an extremely common cause of respiratory symptoms just like yours , many times with no obvious heartburn at all.    It can be treated with medication, but also with lifestyle changes including elevation of the head of your bed (ideally with 6 inch  bed blocks),  Smoking cessation, avoidance of late meals, excessive alcohol, and avoid fatty foods, chocolate, peppermint, colas, red wine, and acidic juices such as orange juice.  NO MINT OR MENTHOL PRODUCTS SO NO COUGH DROPS  USE SUGARLESS CANDY INSTEAD (Jolley ranchers or Stover's or Life Savers) or even ice chips will also do - the key is to swallow to prevent all throat clearing. NO OIL BASED VITAMINS - use powdered substitutes.    Will need speech therapy evaluation at [facility] to see if any further dietary changes are needed

## 2017-03-24 ENCOUNTER — Encounter: Payer: Self-pay | Admitting: Internal Medicine

## 2017-03-24 NOTE — Progress Notes (Signed)
ATC, NA and no option to leave msg 

## 2017-03-24 NOTE — Assessment & Plan Note (Signed)
The most common causes of chronic cough in immunocompetent adults include the following: upper airway cough syndrome (UACS), previously referred to as postnasal drip syndrome (PNDS), which is caused by variety of rhinosinus conditions; (2) asthma; (3) GERD; (4) chronic bronchitis from cigarette smoking or other inhaled environmental irritants; (5) nonasthmatic eosinophilic bronchitis; and (6) bronchiectasis.   These conditions, singly or in combination, have accounted for up to 94% of the causes of chronic cough in prospective studies.   Other conditions have constituted no >6% of the causes in prospective studies These have included bronchogenic carcinoma, chronic interstitial pneumonia, sarcoidosis, left ventricular failure, ACEI-induced cough, and aspiration from a condition associated with pharyngeal dysfunction.    Chronic cough is often simultaneously caused by more than one condition. A single cause has been found from 38 to 82% of the time, multiple causes from 18 to 62%. Multiply caused cough has been the result of three diseases up to 42% of the time.    Since the symptom is worse p meals and hs strongly suspect either gerd or LPR (related to acid or non-acid mech) or aspiration .  Lack of cough resolution on a verified empirical regimen could mean an alternative diagnosis, persistence of the disease state (eg sinusitis or bronchiectasis) , or inadequacy of currently available therapy (eg no medical rx available for non-acid gerd, an extremely common problem in th elderly, which may be the case here)   Would start with max rx for gerd and redo ST eval and if still coughing in a month then do sinus ct and chest chest ct looking for sinusitis/ bronchiectasis but doubt strongly this will be needed    Total time devoted to counseling  > 50 % of initial 60 min office visit:  review case with pt/husbanddiscussion of options/alternatives/ personally creating written customized instructions  in  presence of pt  then going over those specific  Instructions directly with the pt including how to use all of the meds but in particular covering each new medication in detail and the difference between the maintenance= "automatic" meds and the prns using an action plan format for the latter (If this problem/symptom => do that organization reading Left to right).  Please see AVS from this visit for a full list of these instructions which I personally wrote for this pt and  are unique to this visit.

## 2017-03-31 ENCOUNTER — Encounter: Payer: Self-pay | Admitting: *Deleted

## 2017-04-19 ENCOUNTER — Encounter (HOSPITAL_BASED_OUTPATIENT_CLINIC_OR_DEPARTMENT_OTHER): Payer: Medicare Other | Attending: Internal Medicine

## 2017-04-19 DIAGNOSIS — I10 Essential (primary) hypertension: Secondary | ICD-10-CM | POA: Insufficient documentation

## 2017-04-19 DIAGNOSIS — L89154 Pressure ulcer of sacral region, stage 4: Secondary | ICD-10-CM | POA: Insufficient documentation

## 2017-04-19 DIAGNOSIS — F0390 Unspecified dementia without behavioral disturbance: Secondary | ICD-10-CM | POA: Diagnosis not present

## 2017-05-17 ENCOUNTER — Encounter (HOSPITAL_BASED_OUTPATIENT_CLINIC_OR_DEPARTMENT_OTHER): Payer: Medicare Other | Attending: Internal Medicine

## 2017-05-17 DIAGNOSIS — L89154 Pressure ulcer of sacral region, stage 4: Secondary | ICD-10-CM | POA: Diagnosis not present

## 2017-05-17 DIAGNOSIS — F0390 Unspecified dementia without behavioral disturbance: Secondary | ICD-10-CM | POA: Insufficient documentation

## 2017-05-17 DIAGNOSIS — I10 Essential (primary) hypertension: Secondary | ICD-10-CM | POA: Insufficient documentation

## 2017-06-14 ENCOUNTER — Encounter (HOSPITAL_BASED_OUTPATIENT_CLINIC_OR_DEPARTMENT_OTHER): Payer: Medicare Other | Attending: Internal Medicine

## 2017-06-14 DIAGNOSIS — F0390 Unspecified dementia without behavioral disturbance: Secondary | ICD-10-CM | POA: Insufficient documentation

## 2017-06-14 DIAGNOSIS — L89154 Pressure ulcer of sacral region, stage 4: Secondary | ICD-10-CM | POA: Insufficient documentation

## 2017-06-14 DIAGNOSIS — I10 Essential (primary) hypertension: Secondary | ICD-10-CM | POA: Insufficient documentation

## 2017-06-14 DIAGNOSIS — Z933 Colostomy status: Secondary | ICD-10-CM | POA: Diagnosis not present

## 2017-07-12 DIAGNOSIS — L89154 Pressure ulcer of sacral region, stage 4: Secondary | ICD-10-CM | POA: Diagnosis not present

## 2017-07-13 ENCOUNTER — Non-Acute Institutional Stay: Payer: Medicare Other | Admitting: Hospice and Palliative Medicine

## 2017-07-13 DIAGNOSIS — Z515 Encounter for palliative care: Secondary | ICD-10-CM

## 2017-07-13 NOTE — Progress Notes (Signed)
PALLIATIVE CARE CONSULT VISIT   PATIENT NAME: [name] Frank DOB: 11/30/1934 MRN: [id number]  PRIMARY CARE PROVIDER: Kristian Covey, MD  REFERRING PROVIDER: Dr. Evlyn Kanner  RESPONSIBLE PARTY:   husband, Carrissa Taitano [phone]   RECOMMENDATIONS and PLAN:  1. Weakness - at functional baseline in wheelchair. She is functionally dependent upon staff for all care. Today, staff report that she has been mostly stable since last seen. Oral intake is reportedly adequate, although this was previously poor. Weight appears stable over the past month at 146lbs.  2. GOC - DNR noted. MOST form in chart detailing DNAR, limited scope of treatment, IVF and abx if indicated, and feeding tube long term.  I spent 10 minutes providing this consultation,  from 1230 to 1240. More than 50% of the time in this consultation was spent coordinating communication.   HISTORY OF PRESENT ILLNESS:  [name] Frank is an 83 yo female with multiple medical problems including dementia, hypertension, protein calorie malnutrition, stage IV sacral decubitus ulcer with history of osteomyelitis, hyperlipidemia, hypertension.  Palliative care has been following due to a history of progressive decline. Routine follow up visit made today.   CODE STATUS: DNR  PPS: 30% HOSPICE ELIGIBILITY/DIAGNOSIS: TBD  PAST MEDICAL HISTORY:  Past Medical History:  Diagnosis Date  . Allergy   . Anxiety   . Arthritis    degenerative oateoarthritis  . Decubital ulcer 11/14/2014  . Dementia   . GERD (gastroesophageal reflux disease)   . Hyperlipidemia   . Hypertension   . Neurogenic urinary incontinence    since back surgery  . Urinary frequency     SOCIAL HX:  Social History   Tobacco Use  . Smoking status: Former Smoker    Packs/day: 0.50    Years: 10.00    Pack years: 5.00    Types: Cigarettes    Last attempt to quit: 09/23/1983    Years since quitting: 33.8  . Smokeless tobacco: Never Used  Substance Use Topics  .  Alcohol use: No    ALLERGIES:  Allergies  Allergen Reactions  . Augmentin [Amoxicillin-Pot Clavulanate] Other (See Comments)    Per MAR  . Codeine Sulfate Other (See Comments)    stomach pains  . Tramadol Hcl Other (See Comments)    stomach cramps     PERTINENT MEDICATIONS:  Outpatient Encounter Medications as of 07/13/2017  Medication Sig  . Amino Acids-Protein Hydrolys (FEEDING SUPPLEMENT, PRO-STAT SUGAR FREE 64,) LIQD Take 30 mLs by mouth 3 (three) times daily with meals.  Marland Kitchen amLODipine (NORVASC) 5 MG tablet Take 5 mg by mouth daily.  Marland Kitchen atorvastatin (LIPITOR) 10 MG tablet Take 10 mg by mouth daily.  . cloNIDine (CATAPRES) 0.1 MG tablet Take 0.1 mg by mouth 2 (two) times daily.  Marland Kitchen donepezil (ARICEPT) 10 MG tablet Take 1 tablet (10 mg total) by mouth at bedtime.  . famotidine (PEPCID) 20 MG tablet One at bedtime  . feeding supplement (BOOST / RESOURCE BREEZE) LIQD Take 1 Container by mouth 3 (three) times daily between meals.  . fluticasone (FLONASE) 50 MCG/ACT nasal spray Place 2 sprays into both nostrils daily.  . furosemide (LASIX) 20 MG tablet TAKE 1 TABLET (20 MG TOTAL) BY MOUTH DAILY.  Marland Kitchen ipratropium-albuterol (DUONEB) 0.5-2.5 (3) MG/3ML SOLN Take 3 mLs by nebulization every 4 (four) hours as needed.  Marland Kitchen ketoconazole (NIZORAL) 2 % shampoo Apply 1 application topically 2 (two) times a week.  . loratadine (CLARITIN) 10 MG tablet Take 10 mg by mouth daily.  Marland Kitchen  losartan (COZAAR) 50 MG tablet TAKE 1 TABLET BY MOUTH DAILY (Patient taking differently: TAKE 50 MG BY MOUTH DAILY)  . metoprolol succinate (TOPROL-XL) 25 MG 24 hr tablet Take 1 tablet (25 mg total) by mouth daily.  . Multiple Vitamins-Minerals (CERTAGEN) tablet Take 1 tablet by mouth daily.  . pantoprazole (PROTONIX) 40 MG tablet Take 1 tablet (40 mg total) by mouth daily. Take 30-60 min before first meal of the day  . vitamin C (VITAMIN C) 500 MG tablet Take 1 tablet (500 mg total) by mouth 2 (two) times daily.  . Vitamin D,  Ergocalciferol, (DRISDOL) 50000 units CAPS capsule Take 50,000 Units by mouth every 7 (seven) days.  Marland Kitchen zinc sulfate 220 (50 Zn) MG capsule Take 220 mg by mouth 2 (two) times daily.   No facility-administered encounter medications on file as of 07/13/2017.     PHYSICAL EXAM:   General: NAD, frail appearing, thin, in wheelchair Cardiovascular: regular rate and rhythm Pulmonary: clear ant fields Abdomen: soft, nontender, + bowel sounds, colostomy noted but not visualized GU: no suprapubic tenderness, Foley noted but not visualized Extremities: no edema, no joint deformities Skin: sacral ulcer noted but not visualized Neurological: Weakness, confused, speech somewhat difficult to understand. Follows simple commands,  Malachy Moan, NP

## 2017-08-06 ENCOUNTER — Other Ambulatory Visit: Payer: Self-pay

## 2017-08-06 ENCOUNTER — Encounter (HOSPITAL_COMMUNITY): Payer: Self-pay

## 2017-08-06 ENCOUNTER — Emergency Department (HOSPITAL_COMMUNITY): Payer: Medicare Other

## 2017-08-06 ENCOUNTER — Emergency Department (HOSPITAL_COMMUNITY)
Admission: EM | Admit: 2017-08-06 | Discharge: 2017-08-06 | Disposition: A | Discharge status: Home / Self Care | Payer: Medicare Other | Attending: Emergency Medicine | Admitting: Emergency Medicine

## 2017-08-06 DIAGNOSIS — Z79899 Other long term (current) drug therapy: Secondary | ICD-10-CM | POA: Insufficient documentation

## 2017-08-06 DIAGNOSIS — Z87891 Personal history of nicotine dependence: Secondary | ICD-10-CM | POA: Insufficient documentation

## 2017-08-06 DIAGNOSIS — Z933 Colostomy status: Secondary | ICD-10-CM | POA: Diagnosis not present

## 2017-08-06 DIAGNOSIS — R102 Pelvic and perineal pain: Secondary | ICD-10-CM | POA: Insufficient documentation

## 2017-08-06 DIAGNOSIS — Z96641 Presence of right artificial hip joint: Secondary | ICD-10-CM | POA: Diagnosis not present

## 2017-08-06 DIAGNOSIS — I10 Essential (primary) hypertension: Secondary | ICD-10-CM | POA: Diagnosis not present

## 2017-08-06 DIAGNOSIS — F0390 Unspecified dementia without behavioral disturbance: Secondary | ICD-10-CM | POA: Diagnosis not present

## 2017-08-06 DIAGNOSIS — K6289 Other specified diseases of anus and rectum: Secondary | ICD-10-CM | POA: Insufficient documentation

## 2017-08-06 LAB — COMPREHENSIVE METABOLIC PANEL
ALK PHOS: 104 U/L (ref 38–126)
ALT: 11 U/L — ABNORMAL LOW (ref 14–54)
ANION GAP: 11 (ref 5–15)
AST: 17 U/L (ref 15–41)
Albumin: 3.5 g/dL (ref 3.5–5.0)
BILIRUBIN TOTAL: 0.4 mg/dL (ref 0.3–1.2)
BUN: 8 mg/dL (ref 6–20)
CALCIUM: 9 mg/dL (ref 8.9–10.3)
CO2: 30 mmol/L (ref 22–32)
Chloride: 105 mmol/L (ref 101–111)
Creatinine, Ser: 0.68 mg/dL (ref 0.44–1.00)
GFR calc non Af Amer: >60 mL/min (ref >60)
Glucose, Bld: 104 mg/dL — ABNORMAL HIGH (ref 65–99)
POTASSIUM: 3.3 mmol/L — AB (ref 3.5–5.1)
SODIUM: 146 mmol/L — AB (ref 135–145)
TOTAL PROTEIN: 7.1 g/dL (ref 6.5–8.1)

## 2017-08-06 LAB — CBC WITH DIFFERENTIAL/PLATELET
BASOS ABS: 0 10*3/uL (ref 0.0–0.1)
BASOS PCT: 0 %
Eosinophils Absolute: 0.1 10*3/uL (ref 0.0–0.7)
Eosinophils Relative: 1 %
HCT: 43.6 % (ref 36.0–46.0)
HEMOGLOBIN: 14.3 g/dL (ref 12.0–15.0)
Lymphocytes Relative: 19 %
Lymphs Abs: 2.6 10*3/uL (ref 0.7–4.0)
MCH: 27.7 pg (ref 26.0–34.0)
MCHC: 32.8 g/dL (ref 30.0–36.0)
MCV: 84.5 fL (ref 78.0–100.0)
Monocytes Absolute: 0.5 10*3/uL (ref 0.1–1.0)
Monocytes Relative: 4 %
NEUTROS PCT: 76 %
Neutro Abs: 10.3 10*3/uL — ABNORMAL HIGH (ref 1.7–7.7)
Platelets: 279 10*3/uL (ref 150–400)
RBC: 5.16 MIL/uL — AB (ref 3.87–5.11)
RDW: 15.8 % — ABNORMAL HIGH (ref 11.5–15.5)
WBC: 13.5 10*3/uL — ABNORMAL HIGH (ref 4.0–10.5)

## 2017-08-06 LAB — LIPASE, BLOOD: Lipase: 29 U/L (ref 11–51)

## 2017-08-06 MED ORDER — IOPAMIDOL (ISOVUE-300) INJECTION 61%
INTRAVENOUS | Status: AC
  Administered 2017-08-06: 100 mL
  Filled 2017-08-06: qty 100

## 2017-08-06 MED ORDER — POTASSIUM CHLORIDE CRYS ER 20 MEQ PO TBCR
20.0000 meq | EXTENDED_RELEASE_TABLET | Freq: Once | ORAL | Status: AC
  Administered 2017-08-06: 20 meq via ORAL
  Filled 2017-08-06: qty 1

## 2017-08-06 NOTE — ED Triage Notes (Addendum)
EMS reports from [facility] facility, husband called for rectal discharge, pt has colostomy bag. pt baseline mimimally communicative.  BP 177/96 HR 85 Resp 18 Sp02 98 RA

## 2017-08-06 NOTE — ED Notes (Signed)
Pt isn't able to give urine sample at the moment.

## 2017-08-06 NOTE — Discharge Instructions (Addendum)
Your CT scan shows normal function of your colostomy.  You have what is called a loop colostomy and findings today were normal.  I will have you follow-up with your primary care provider this week. If you develop worsening or new concerning symptoms you can return to the emergency department for re-evaluation.

## 2017-08-06 NOTE — ED Notes (Signed)
Bed: ZO10 Expected date:  Expected time:  Means of arrival:  Comments: EMS Fecal matter from rectum, has colostomy

## 2017-08-06 NOTE — ED Provider Notes (Signed)
[facility] DEPT Provider Note   CSN: [id number] Arrival date & time: 08/06/17  1357     History   Chief Complaint Chief Complaint  Patient presents with  . GI Problem   Level 5 caveat secondary to dementia HPI [name] Frank is a 83 y.o. female with a history of stage IV decubitus ulcer, loop colostomy bag (2016), and dementia sent from [place] facility for rectal discharge.  Patient reportedly has fecal matter from rectum despite patient's husband.  Unfortunately patient is able to provide me any history.  This is reportedly the patient's baseline. Attempted to contact patients family and unsuccessful. Spoke with patients nurse, [name] Frank who reports that after they changed the colostomy bag this morning they went to change the patient and noticed "mucusy like stool from the rectum". Notes she is eating and drinking like normal. No abdominal pain, mental status at baseline. No other complaints.   HPI  Past Medical History:  Diagnosis Date  . Allergy   . Anxiety   . Arthritis    degenerative oateoarthritis  . Decubital ulcer 11/14/2014  . Dementia   . GERD (gastroesophageal reflux disease)   . Hyperlipidemia   . Hypertension   . Neurogenic urinary incontinence    since back surgery  . Urinary frequency     Patient Active Problem List   Diagnosis Date Noted  . Chronic cough 03/23/2017  . Failure to thrive in adult 05/16/2015  . Sacral decubitus ulcer 05/14/2015  . Malnutrition of moderate degree 05/01/2015  . Wound infection 04/29/2015  . Colostomy in place [facility]) 01/06/2015  . Catheter-associated urinary tract infection ([place]) 11/26/2014  . Osteomyelitis due to staphylococcus aureus ([place]) 11/25/2014  . Bacteremia 11/25/2014  . Postprocedural fever 11/24/2014  . UTI (urinary tract infection) 11/24/2014  . Palliative care encounter 11/22/2014  . DNR (do not resuscitate) discussion 11/22/2014  . Osteomyelitis,  pelvic region and thigh ([place])   . Goals of care, counseling/discussion   . Enterococcus faecalis infection   . Protein-calorie malnutrition, severe ([place]) 11/15/2014  . Decubitus ulcer of sacral region, stage 4 ([place]) 11/14/2014  . Stage IV decubitus ulcer ([place]) 11/14/2014  . Cognitive changes 06/19/2014  . GERD (gastroesophageal reflux disease) 08/17/2013  . Postoperative anemia due to acute blood loss 01/08/2012  . Osteoarthritis 09/23/2010  . Lumbago 03/23/2010  . OVERACTIVE BLADDER 12/16/2009  . EDEMA 04/25/2009  . DEGENERATIVE JOINT DISEASE, HIPS 09/20/2008  . HYPERLIPIDEMIA 07/10/2008  . Essential hypertension 07/10/2008  . URINARY INCONTINENCE 07/10/2008    Past Surgical History:  Procedure Laterality Date  . ABDOMINAL HYSTERECTOMY    . APPENDECTOMY    . BACK SURGERY  2000  . BONE MARROW BIOPSY N/A 11/22/2014   Procedure: SACRAL BONE BIOPSY PRONE POSITION;  Surgeon: Georganna Skeans, MD;  Location: [facility];  Service: General;  Laterality: N/A;  . [place]   biopsy  . CHOLECYSTECTOMY    . COLON SURGERY    . COLONOSCOPY W/ POLYPECTOMY    . ILEO LOOP COLOSTOMY CLOSURE N/A 11/22/2014   Procedure: LAPAROSCOPIC LOOP COLOSTOMY ;  Surgeon: Georganna Skeans, MD;  Location: [facility];  Service: General;  Laterality: N/A;  . TOTAL HIP ARTHROPLASTY  01/07/2012   Procedure: TOTAL HIP ARTHROPLASTY;  Surgeon: Yvette Rack., MD;  Location: [facility];  Service: Orthopedics;  Laterality: Right;     OB History   None      Home Medications    Prior to Admission medications  Medication Sig Start Date End Date Taking? Authorizing Provider  Amino Acids-Protein Hydrolys (FEEDING SUPPLEMENT, PRO-STAT SUGAR FREE 64,) LIQD Take 30 mLs by mouth 3 (three) times daily with meals.    [provider]  amLODipine (NORVASC) 5 MG tablet Take 5 mg by mouth daily.    [provider]  atorvastatin (LIPITOR) 10 MG tablet Take 10 mg by mouth daily.    [provider]  cloNIDine  (CATAPRES) 0.1 MG tablet Take 0.1 mg by mouth 2 (two) times daily.    [provider]  donepezil (ARICEPT) 10 MG tablet Take 1 tablet (10 mg total) by mouth at bedtime. 07/19/14   Burchette, Alinda Sierras, MD  famotidine (PEPCID) 20 MG tablet One at bedtime 03/23/17   Tanda Rockers, MD  feeding supplement (BOOST / RESOURCE BREEZE) LIQD Take 1 Container by mouth 3 (three) times daily between meals. 11/26/14   Janece Canterbury, MD  fluticasone (FLONASE) 50 MCG/ACT nasal spray Place 2 sprays into both nostrils daily.    [provider]  furosemide (LASIX) 20 MG tablet TAKE 1 TABLET (20 MG TOTAL) BY MOUTH DAILY. 10/02/14   Burchette, Alinda Sierras, MD  ipratropium-albuterol (DUONEB) 0.5-2.5 (3) MG/3ML SOLN Take 3 mLs by nebulization every 4 (four) hours as needed.    [provider]  ketoconazole (NIZORAL) 2 % shampoo Apply 1 application topically 2 (two) times a week.    [provider]  loratadine (CLARITIN) 10 MG tablet Take 10 mg by mouth daily.    [provider]  losartan (COZAAR) 50 MG tablet TAKE 1 TABLET BY MOUTH DAILY Patient taking differently: TAKE 50 MG BY MOUTH DAILY 01/14/14   Burchette, Alinda Sierras, MD  metoprolol succinate (TOPROL-XL) 25 MG 24 hr tablet Take 1 tablet (25 mg total) by mouth daily. 04/14/15   Burchette, Alinda Sierras, MD  Multiple Vitamins-Minerals (CERTAGEN) tablet Take 1 tablet by mouth daily.    [provider]  pantoprazole (PROTONIX) 40 MG tablet Take 1 tablet (40 mg total) by mouth daily. Take 30-60 min before first meal of the day 03/23/17   Tanda Rockers, MD  vitamin C (VITAMIN C) 500 MG tablet Take 1 tablet (500 mg total) by mouth 2 (two) times daily. 11/26/14   Janece Canterbury, MD  Vitamin D, Ergocalciferol, (DRISDOL) 50000 units CAPS capsule Take 50,000 Units by mouth every 7 (seven) days.    [provider]  zinc sulfate 220 (50 Zn) MG capsule Take 220 mg by mouth 2 (two) times daily.    [provider]    Family  History Family History  Problem Relation Age of Onset  . Diabetes Mother   . Alcohol abuse Unknown        fhx  . Arthritis Unknown        fhx  . Hypertension Unknown        fhx  . Heart disease Unknown        fhx  . Cancer Unknown        colon, lung, prostate/fhx  . Stroke Unknown        fhx  . Depression Unknown        fhx    Social History Social History   Tobacco Use  . Smoking status: Former Smoker    Packs/day: 0.50    Years: 10.00    Pack years: 5.00    Types: Cigarettes    Last attempt to quit: 09/23/1983    Years since quitting: 33.8  .  Smokeless tobacco: Never Used  Substance Use Topics  . Alcohol use: No  . Drug use: No     Allergies   Augmentin [amoxicillin-pot clavulanate]; Codeine sulfate; and Tramadol hcl   Review of Systems Review of Systems  Unable to perform ROS: Dementia  Level 5 caveat   Physical Exam Updated Vital Signs BP (!) 164/88   Pulse 77   Temp 98 F (36.7 C) (Oral)   Resp 16   SpO2 98%   Physical Exam  Constitutional: She appears well-developed and well-nourished.  No acute distress, elderly female resting in bed comfortably  HENT:  Head: Normocephalic and atraumatic.  Right Ear: External ear normal.  Left Ear: External ear normal.  Nose: Nose normal.  Mouth/Throat: Uvula is midline, oropharynx is clear and moist and mucous membranes are normal. No tonsillar exudate.  Eyes: Pupils are equal, round, and reactive to light. Right eye exhibits no discharge. Left eye exhibits no discharge. No scleral icterus.  Neck: Trachea normal. Neck supple. No spinous process tenderness present. No neck rigidity. Normal range of motion present.  Cardiovascular: Normal rate, regular rhythm and intact distal pulses.  No murmur heard. Pulses:      Radial pulses are 2+ on the right side, and 2+ on the left side.       Dorsalis pedis pulses are 2+ on the right side, and 2+ on the left side.       Posterior tibial pulses are 2+ on the right  side, and 2+ on the left side.  Pulmonary/Chest: Effort normal and breath sounds normal. She exhibits no tenderness.  Abdominal: Soft. Bowel sounds are normal. There is no tenderness. There is no rigidity, no rebound, no guarding and no CVA tenderness.    Genitourinary:  Genitourinary Comments: Chaperone present  Sacral decubitus ulcer seen with ointment and gauze over top.  Patient does have mucus like fecal matter out of rectum onto depends. No gross blood or melena.  No masses on digital rectal exam.  Musculoskeletal: She exhibits no edema.  Lymphadenopathy:    She has no cervical adenopathy.  Neurological: She is alert.  Skin: Skin is warm and dry. No rash noted. She is not diaphoretic.  Psychiatric: She has a normal mood and affect.  Nursing note and vitals reviewed.    ED Treatments / Results  Labs (all labs ordered are listed, but only abnormal results are displayed) Labs Reviewed  COMPREHENSIVE METABOLIC PANEL - Abnormal; Notable for the following components:      Result Value   Sodium 146 (*)    Potassium 3.3 (*)    Glucose, Bld 104 (*)    ALT 11 (*)    All other components within normal limits  CBC WITH DIFFERENTIAL/PLATELET - Abnormal; Notable for the following components:   WBC 13.5 (*)    RBC 5.16 (*)    RDW 15.8 (*)    Neutro Abs 10.3 (*)    All other components within normal limits  LIPASE, BLOOD  URINALYSIS, ROUTINE W REFLEX MICROSCOPIC    EKG None  Radiology Ct Abdomen Pelvis W Contrast  Result Date: 08/06/2017 CLINICAL DATA:  Patient has a colostomy but is also having fecal matter from the rectum. EXAM: CT ABDOMEN AND PELVIS WITH CONTRAST TECHNIQUE: Multidetector CT imaging of the abdomen and pelvis was performed using the standard protocol following bolus administration of intravenous contrast. CONTRAST:  154m ISOVUE-300 IOPAMIDOL (ISOVUE-300) INJECTION 61% COMPARISON:  CT, 11/15/2014 FINDINGS: Lower chest: No acute abnormality. Hepatobiliary: Liver  normal  in size and overall attenuation. There are multiple low-density liver masses consistent with cysts, relatively stable from the prior CT scan. Status post cholecystectomy. No bile duct dilation Pancreas: Unremarkable. No pancreatic ductal dilatation or surrounding inflammatory changes. Spleen: Normal in size without focal abnormality. Adrenals/Urinary Tract: No adrenal masses. Kidneys are normal in overall size and position. There is bilateral cortical thinning. Multiple low-density renal masses are noted consistent with cysts. No intrarenal stones. No hydronephrosis. Ureters are normal course and caliber. Bladder is decompressed with a Foley catheter. Stomach/Bowel: Patient has a left lower quadrant loop colostomy. There is stool in the sigmoid colon and mild to moderately distending the rectum. There is no colonic wall thickening or adjacent inflammation. Stomach and small bowel are unremarkable. Vascular/Lymphatic: Atherosclerotic disease noted along the abdominal aorta. No enlarged lymph nodes. Reproductive: Status post hysterectomy. No adnexal masses. Other: No ascites. Musculoskeletal: Well-positioned right total hip arthroplasty. Advanced left hip joint arthropathic changes. Skeletal structures are diffusely demineralized. No fracture or acute finding. No osteoblastic or osteolytic lesions. IMPRESSION: 1. The patient has a loop colostomy with both the proximal and distal limbs opening into a common left lower quadrant stoma. The stool noted in the distal colon and rectum is coming from the more proximal colon, and not exiting through the colostomy stoma. There is no evidence of a fistula. 2. No acute abnormalities within the abdomen or pelvis. Electronically Signed   By: Lajean Manes M.D.   On: 08/06/2017 16:37    Procedures Procedures (including critical care time)  Medications Ordered in ED Medications  potassium chloride SA (K-DUR,KLOR-CON) CR tablet 20 mEq (has no administration in time  range)  iopamidol (ISOVUE-300) 61 % injection (100 mLs  Contrast Given 08/06/17 1614)     Initial Impression / Assessment and Plan / ED Course  I have reviewed the triage vital signs and the nursing notes.  Pertinent labs & imaging results that were available during my care of the patient were reviewed by me and considered in my medical decision making (see chart for details).     83 y.o. female with a history  loop colostomy bag (2016) and dementia sent from [facility] facility for stool out of rectum.  Unable to contact family initially. Patient has dementia and unable to collect history from patient. Spoke with patient's nurse, [name] Frank who reports that after they changed the colostomy bag this morning they went to change the patient and noticed "mucusy like stool from the rectum". Notes she is eating and drinking like normal. No abdominal pain, mental status at baseline. No other complaints.   Patient vital signs are reassuring.  Abdominal exam is benign. No peritoneal signs. Patient does have mild mucus-like fecal matter out of rectum onto depends on my exam.  No masses palpated.  Blood work is reassuring.  CT scan shows normal function of loop colostomy.  I discussed these results with radiologist Dr. Lajean Manes to confirm.  No other acute abnormalities of the abdomen or pelvis.  Family is present at time of boarding results.  They state understanding.  No findings for emergent cause for further work-up or admission.  Will discharge the patient back to nursing facility.  Family is in agreement with plan.  We discussed reasons to return.  Patient appears safe for discharge.  Patient case seen and discussed with Dr. Alvino Chapel who is in agreement with plan. .  Final Clinical Impressions(s) / ED Diagnoses   Final diagnoses:  Presence of colostomy ([place])  ED Discharge Orders    None       [name] Frank 08/06/17 1733    Davonna Belling,  MD 08/07/17 2211

## 2017-08-09 ENCOUNTER — Encounter (HOSPITAL_BASED_OUTPATIENT_CLINIC_OR_DEPARTMENT_OTHER): Payer: Medicare Other | Attending: Internal Medicine

## 2017-08-09 ENCOUNTER — Other Ambulatory Visit (HOSPITAL_COMMUNITY)
Admission: RE | Admit: 2017-08-09 | Discharge: 2017-08-09 | Disposition: A | Discharge status: Home / Self Care | Payer: Medicare Other | Source: Other Acute Inpatient Hospital | Attending: Internal Medicine | Admitting: Internal Medicine

## 2017-08-09 DIAGNOSIS — L89154 Pressure ulcer of sacral region, stage 4: Secondary | ICD-10-CM | POA: Diagnosis present

## 2017-08-09 DIAGNOSIS — L89310 Pressure ulcer of right buttock, unstageable: Secondary | ICD-10-CM | POA: Diagnosis not present

## 2017-08-12 LAB — AEROBIC CULTURE W GRAM STAIN (SUPERFICIAL SPECIMEN)

## 2017-08-12 LAB — AEROBIC CULTURE  (SUPERFICIAL SPECIMEN)

## 2017-08-23 ENCOUNTER — Encounter (HOSPITAL_BASED_OUTPATIENT_CLINIC_OR_DEPARTMENT_OTHER): Payer: Medicare Other | Attending: Internal Medicine

## 2017-08-23 DIAGNOSIS — L89310 Pressure ulcer of right buttock, unstageable: Secondary | ICD-10-CM | POA: Insufficient documentation

## 2017-08-23 DIAGNOSIS — F0390 Unspecified dementia without behavioral disturbance: Secondary | ICD-10-CM | POA: Insufficient documentation

## 2017-08-23 DIAGNOSIS — L89154 Pressure ulcer of sacral region, stage 4: Secondary | ICD-10-CM | POA: Insufficient documentation

## 2017-08-23 DIAGNOSIS — I10 Essential (primary) hypertension: Secondary | ICD-10-CM | POA: Diagnosis not present

## 2017-09-10 ENCOUNTER — Encounter (HOSPITAL_COMMUNITY): Payer: Self-pay

## 2017-09-10 ENCOUNTER — Inpatient Hospital Stay (HOSPITAL_COMMUNITY)
Admission: EM | Admit: 2017-09-10 | Discharge: 2017-09-15 | DRG: 871 | Disposition: A | Discharge status: Skilled Nursing Facility | Payer: Medicare Other | Attending: Internal Medicine | Admitting: Internal Medicine

## 2017-09-10 ENCOUNTER — Other Ambulatory Visit: Payer: Self-pay

## 2017-09-10 ENCOUNTER — Emergency Department (HOSPITAL_COMMUNITY): Payer: Medicare Other

## 2017-09-10 DIAGNOSIS — Z7401 Bed confinement status: Secondary | ICD-10-CM

## 2017-09-10 DIAGNOSIS — E119 Type 2 diabetes mellitus without complications: Secondary | ICD-10-CM | POA: Diagnosis not present

## 2017-09-10 DIAGNOSIS — R5381 Other malaise: Secondary | ICD-10-CM | POA: Diagnosis present

## 2017-09-10 DIAGNOSIS — Z87891 Personal history of nicotine dependence: Secondary | ICD-10-CM | POA: Diagnosis not present

## 2017-09-10 DIAGNOSIS — Z885 Allergy status to narcotic agent status: Secondary | ICD-10-CM

## 2017-09-10 DIAGNOSIS — Z96641 Presence of right artificial hip joint: Secondary | ICD-10-CM | POA: Diagnosis not present

## 2017-09-10 DIAGNOSIS — Z66 Do not resuscitate: Secondary | ICD-10-CM | POA: Diagnosis present

## 2017-09-10 DIAGNOSIS — L03115 Cellulitis of right lower limb: Secondary | ICD-10-CM | POA: Diagnosis not present

## 2017-09-10 DIAGNOSIS — L03319 Cellulitis of trunk, unspecified: Secondary | ICD-10-CM

## 2017-09-10 DIAGNOSIS — E785 Hyperlipidemia, unspecified: Secondary | ICD-10-CM | POA: Diagnosis present

## 2017-09-10 DIAGNOSIS — K219 Gastro-esophageal reflux disease without esophagitis: Secondary | ICD-10-CM | POA: Diagnosis present

## 2017-09-10 DIAGNOSIS — Z79899 Other long term (current) drug therapy: Secondary | ICD-10-CM | POA: Diagnosis not present

## 2017-09-10 DIAGNOSIS — E43 Unspecified severe protein-calorie malnutrition: Secondary | ICD-10-CM | POA: Diagnosis not present

## 2017-09-10 DIAGNOSIS — Z881 Allergy status to other antibiotic agents status: Secondary | ICD-10-CM

## 2017-09-10 DIAGNOSIS — R1312 Dysphagia, oropharyngeal phase: Secondary | ICD-10-CM | POA: Diagnosis not present

## 2017-09-10 DIAGNOSIS — L89159 Pressure ulcer of sacral region, unspecified stage: Secondary | ICD-10-CM | POA: Diagnosis present

## 2017-09-10 DIAGNOSIS — Z933 Colostomy status: Secondary | ICD-10-CM | POA: Diagnosis not present

## 2017-09-10 DIAGNOSIS — Z886 Allergy status to analgesic agent status: Secondary | ICD-10-CM | POA: Diagnosis not present

## 2017-09-10 DIAGNOSIS — L89154 Pressure ulcer of sacral region, stage 4: Secondary | ICD-10-CM | POA: Diagnosis not present

## 2017-09-10 DIAGNOSIS — F0390 Unspecified dementia without behavioral disturbance: Secondary | ICD-10-CM | POA: Diagnosis present

## 2017-09-10 DIAGNOSIS — E876 Hypokalemia: Secondary | ICD-10-CM | POA: Diagnosis present

## 2017-09-10 DIAGNOSIS — Z6824 Body mass index (BMI) 24.0-24.9, adult: Secondary | ICD-10-CM | POA: Diagnosis not present

## 2017-09-10 DIAGNOSIS — I10 Essential (primary) hypertension: Secondary | ICD-10-CM | POA: Diagnosis present

## 2017-09-10 DIAGNOSIS — A419 Sepsis, unspecified organism: Secondary | ICD-10-CM | POA: Diagnosis not present

## 2017-09-10 HISTORY — DX: Unspecified open wound of lower back and pelvis without penetration into retroperitoneum, initial encounter: S31.000A

## 2017-09-10 LAB — URINALYSIS, ROUTINE W REFLEX MICROSCOPIC
Bilirubin Urine: NEGATIVE
Glucose, UA: NEGATIVE mg/dL
Ketones, ur: 5 mg/dL — AB
Nitrite: NEGATIVE
Protein, ur: 100 mg/dL — AB
RBC / HPF: >50 RBC/hpf — ABNORMAL HIGH (ref 0–5)
Specific Gravity, Urine: 1.038 — ABNORMAL HIGH (ref 1.005–1.030)
Trans Epithel, UA: 1
WBC, UA: >50 WBC/hpf — ABNORMAL HIGH (ref 0–5)
pH: 5 (ref 5.0–8.0)

## 2017-09-10 LAB — CBC WITH DIFFERENTIAL/PLATELET
Abs Immature Granulocytes: 0.1 10*3/uL (ref 0.0–0.1)
Basophils Absolute: 0 10*3/uL (ref 0.0–0.1)
Basophils Relative: 0 %
Eosinophils Absolute: 0 10*3/uL (ref 0.0–0.7)
Eosinophils Relative: 0 %
HCT: 44.6 % (ref 36.0–46.0)
Hemoglobin: 14 g/dL (ref 12.0–15.0)
Immature Granulocytes: 0 %
Lymphocytes Relative: 13 %
Lymphs Abs: 1.7 10*3/uL (ref 0.7–4.0)
MCH: 26.1 pg (ref 26.0–34.0)
MCHC: 31.4 g/dL (ref 30.0–36.0)
MCV: 83.1 fL (ref 78.0–100.0)
Monocytes Absolute: 0.7 10*3/uL (ref 0.1–1.0)
Monocytes Relative: 5 %
Neutro Abs: 11.2 10*3/uL — ABNORMAL HIGH (ref 1.7–7.7)
Neutrophils Relative %: 82 %
Platelets: 217 10*3/uL (ref 150–400)
RBC: 5.37 MIL/uL — ABNORMAL HIGH (ref 3.87–5.11)
RDW: 16.2 % — ABNORMAL HIGH (ref 11.5–15.5)
WBC: 13.7 10*3/uL — ABNORMAL HIGH (ref 4.0–10.5)

## 2017-09-10 LAB — COMPREHENSIVE METABOLIC PANEL
ALT: 13 U/L (ref 0–44)
AST: 22 U/L (ref 15–41)
Albumin: 2.9 g/dL — ABNORMAL LOW (ref 3.5–5.0)
Alkaline Phosphatase: 76 U/L (ref 38–126)
Anion gap: 10 (ref 5–15)
BUN: 8 mg/dL (ref 8–23)
CO2: 28 mmol/L (ref 22–32)
Calcium: 8.3 mg/dL — ABNORMAL LOW (ref 8.9–10.3)
Chloride: 104 mmol/L (ref 98–111)
Creatinine, Ser: 0.77 mg/dL (ref 0.44–1.00)
GFR calc Af Amer: >60 mL/min (ref >60)
GFR calc non Af Amer: >60 mL/min (ref >60)
Glucose, Bld: 122 mg/dL — ABNORMAL HIGH (ref 70–99)
Potassium: 3.1 mmol/L — ABNORMAL LOW (ref 3.5–5.1)
Sodium: 142 mmol/L (ref 135–145)
Total Bilirubin: 0.5 mg/dL (ref 0.3–1.2)
Total Protein: 6.4 g/dL — ABNORMAL LOW (ref 6.5–8.1)

## 2017-09-10 LAB — I-STAT TROPONIN, ED: Troponin i, poc: 0 ng/mL (ref 0.00–0.08)

## 2017-09-10 LAB — MRSA PCR SCREENING: MRSA BY PCR: NEGATIVE

## 2017-09-10 LAB — PROTIME-INR
INR: 1.13
Prothrombin Time: 14.4 seconds (ref 11.4–15.2)

## 2017-09-10 LAB — I-STAT CG4 LACTIC ACID, ED: Lactic Acid, Venous: 1.25 mmol/L (ref 0.5–1.9)

## 2017-09-10 MED ORDER — ENOXAPARIN SODIUM 40 MG/0.4ML ~~LOC~~ SOLN
40.0000 mg | SUBCUTANEOUS | Status: DC
Start: 2017-09-11 — End: 2017-09-15
  Administered 2017-09-11 – 2017-09-15 (×5): 40 mg via SUBCUTANEOUS
  Filled 2017-09-10 (×5): qty 0.4

## 2017-09-10 MED ORDER — BOOST PO LIQD
1.0000 | Freq: Three times a day (TID) | ORAL | Status: DC
  Administered 2017-09-11 – 2017-09-15 (×8): 237 mL via ORAL
  Filled 2017-09-10 (×19): qty 237

## 2017-09-10 MED ORDER — ONDANSETRON HCL 4 MG PO TABS: 4.0000 mg | ORAL_TABLET | Freq: Four times a day (QID) | ORAL | Status: DC | PRN

## 2017-09-10 MED ORDER — PIPERACILLIN-TAZOBACTAM 3.375 G IVPB
3.3750 g | Freq: Three times a day (TID) | INTRAVENOUS | Status: DC
  Administered 2017-09-10 – 2017-09-15 (×14): 3.375 g via INTRAVENOUS
  Filled 2017-09-10 (×15): qty 50

## 2017-09-10 MED ORDER — VITAMIN D (ERGOCALCIFEROL) 1.25 MG (50000 UNIT) PO CAPS
50000.0000 [IU] | ORAL_CAPSULE | ORAL | Status: DC
  Administered 2017-09-11: 50000 [IU] via ORAL
  Filled 2017-09-10: qty 1

## 2017-09-10 MED ORDER — ACETAMINOPHEN 650 MG RE SUPP: 650.0000 mg | Freq: Four times a day (QID) | RECTAL | Status: DC | PRN

## 2017-09-10 MED ORDER — VANCOMYCIN HCL IN DEXTROSE 1-5 GM/200ML-% IV SOLN: 1000.0000 mg | Freq: Once | INTRAVENOUS | Status: DC

## 2017-09-10 MED ORDER — PIPERACILLIN-TAZOBACTAM 3.375 G IVPB 30 MIN: 3.3750 g | Freq: Once | INTRAVENOUS | Status: DC

## 2017-09-10 MED ORDER — VANCOMYCIN HCL 500 MG IV SOLR
500.0000 mg | Freq: Two times a day (BID) | INTRAVENOUS | Status: DC
  Administered 2017-09-11 – 2017-09-13 (×5): 500 mg via INTRAVENOUS
  Filled 2017-09-10 (×6): qty 500

## 2017-09-10 MED ORDER — SODIUM CHLORIDE 0.9 % IV BOLUS
1000.0000 mL | Freq: Once | INTRAVENOUS | Status: AC
  Administered 2017-09-10: 1000 mL via INTRAVENOUS

## 2017-09-10 MED ORDER — METOPROLOL SUCCINATE ER 25 MG PO TB24
25.0000 mg | ORAL_TABLET | Freq: Every day | ORAL | Status: DC
  Administered 2017-09-11 – 2017-09-15 (×5): 25 mg via ORAL
  Filled 2017-09-10 (×5): qty 1

## 2017-09-10 MED ORDER — PANTOPRAZOLE SODIUM 40 MG PO TBEC
40.0000 mg | DELAYED_RELEASE_TABLET | Freq: Every day | ORAL | Status: DC
  Administered 2017-09-11 – 2017-09-15 (×5): 40 mg via ORAL
  Filled 2017-09-10 (×5): qty 1

## 2017-09-10 MED ORDER — VANCOMYCIN HCL IN DEXTROSE 1-5 GM/200ML-% IV SOLN
1000.0000 mg | Freq: Once | INTRAVENOUS | Status: AC
  Administered 2017-09-10: 1000 mg via INTRAVENOUS
  Filled 2017-09-10: qty 200

## 2017-09-10 MED ORDER — LORATADINE 10 MG PO TABS
10.0000 mg | ORAL_TABLET | Freq: Every day | ORAL | Status: DC
  Administered 2017-09-11 – 2017-09-15 (×5): 10 mg via ORAL
  Filled 2017-09-10 (×5): qty 1

## 2017-09-10 MED ORDER — PIPERACILLIN-TAZOBACTAM 3.375 G IVPB 30 MIN
3.3750 g | Freq: Once | INTRAVENOUS | Status: AC
  Administered 2017-09-10: 3.375 g via INTRAVENOUS
  Filled 2017-09-10: qty 50

## 2017-09-10 MED ORDER — IPRATROPIUM-ALBUTEROL 0.5-2.5 (3) MG/3ML IN SOLN: 3.0000 mL | RESPIRATORY_TRACT | Status: DC | PRN

## 2017-09-10 MED ORDER — ZINC SULFATE 220 (50 ZN) MG PO CAPS
220.0000 mg | ORAL_CAPSULE | Freq: Two times a day (BID) | ORAL | Status: DC
  Administered 2017-09-10 – 2017-09-15 (×10): 220 mg via ORAL
  Filled 2017-09-10 (×10): qty 1

## 2017-09-10 MED ORDER — POTASSIUM CHLORIDE CRYS ER 20 MEQ PO TBCR
40.0000 meq | EXTENDED_RELEASE_TABLET | Freq: Once | ORAL | Status: AC
  Administered 2017-09-10: 40 meq via ORAL
  Filled 2017-09-10: qty 2

## 2017-09-10 MED ORDER — PRO-STAT SUGAR FREE PO LIQD
30.0000 mL | Freq: Three times a day (TID) | ORAL | Status: DC
  Administered 2017-09-11 – 2017-09-15 (×8): 30 mL via ORAL
  Filled 2017-09-10 (×8): qty 30

## 2017-09-10 MED ORDER — ACETAMINOPHEN 325 MG PO TABS
650.0000 mg | ORAL_TABLET | Freq: Once | ORAL | Status: AC
  Administered 2017-09-10: 650 mg via ORAL
  Filled 2017-09-10: qty 2

## 2017-09-10 MED ORDER — FLUTICASONE PROPIONATE 50 MCG/ACT NA SUSP
2.0000 | Freq: Every day | NASAL | Status: DC
  Administered 2017-09-11 – 2017-09-15 (×5): 2 via NASAL
  Filled 2017-09-10: qty 16

## 2017-09-10 MED ORDER — ACETAMINOPHEN 325 MG PO TABS: 650.0000 mg | ORAL_TABLET | Freq: Four times a day (QID) | ORAL | Status: DC | PRN

## 2017-09-10 MED ORDER — FAMOTIDINE 20 MG PO TABS
20.0000 mg | ORAL_TABLET | Freq: Every day | ORAL | Status: DC
  Administered 2017-09-10 – 2017-09-15 (×6): 20 mg via ORAL
  Filled 2017-09-10 (×6): qty 1

## 2017-09-10 MED ORDER — SODIUM CHLORIDE 0.9 % IV SOLN
INTRAVENOUS | Status: DC
  Administered 2017-09-10: 20:00:00 via INTRAVENOUS

## 2017-09-10 MED ORDER — ONDANSETRON HCL 4 MG/2ML IJ SOLN: 4.0000 mg | Freq: Four times a day (QID) | INTRAMUSCULAR | Status: DC | PRN

## 2017-09-10 MED ORDER — DONEPEZIL HCL 10 MG PO TABS
10.0000 mg | ORAL_TABLET | Freq: Every day | ORAL | Status: DC
  Administered 2017-09-10 – 2017-09-14 (×5): 10 mg via ORAL
  Filled 2017-09-10 (×5): qty 1

## 2017-09-10 MED ORDER — VITAMIN C 500 MG PO TABS
500.0000 mg | ORAL_TABLET | Freq: Two times a day (BID) | ORAL | Status: DC
  Administered 2017-09-10 – 2017-09-15 (×10): 500 mg via ORAL
  Filled 2017-09-10 (×10): qty 1

## 2017-09-10 MED ORDER — ATORVASTATIN CALCIUM 10 MG PO TABS
10.0000 mg | ORAL_TABLET | Freq: Every day | ORAL | Status: DC
  Administered 2017-09-10 – 2017-09-15 (×6): 10 mg via ORAL
  Filled 2017-09-10 (×6): qty 1

## 2017-09-10 MED ORDER — ADULT MULTIVITAMIN W/MINERALS CH
1.0000 | ORAL_TABLET | Freq: Every day | ORAL | Status: DC
  Administered 2017-09-11 – 2017-09-15 (×5): 1 via ORAL
  Filled 2017-09-10 (×5): qty 1

## 2017-09-10 NOTE — H&P (Signed)
History and Physical    [name] Frank TTS:[id number] DOB: 10/03/1934 DOA: 09/10/2017  I have briefly reviewed the patient's prior medical records in [place]  PCP: Eulas Post, MD  Patient coming from: SNF  Chief Complaint: fever, nausea  HPI: [name] Frank is a 83 y.o. female with medical history significant of advanced dementia, HTN, HLD, chronic sacral wound, who is being brought from her long term SNF due to decreased appetite, nausea / vomiting the day prior and fever. Patient is minimally verbal and husband reports this symptoms (he is visiting her every day). He feels like she is not well taken care of in her SNF, reports that she is not turned over frequently and also complains about the food quality and thinks that is why she had vomiting the day prior. There are no reported chest pain, abdominal pain or any other issues except mentioned above.  Patient can answer basic questions and denies any current discomfort.  Husband reports that she is been having a chronic sacral wound and he has been taking her regularly to the wound center.  Per his report, the wound is getting better.  ED Course: In the emergency room patient was found to be febrile to 100.7, slight tachycardia at 100, blood pressure is within normal parameters.  She has a white count elevated at 13.7, normal hemoglobin, normal platelets.  She has normal kidney function, her albumin is slightly low at 2.9 and she has a slightly low potassium at 3.1. UA pending. She was found to have a cellulitic area on the right hip, given Vanc/Zosyn and we were asked to admit.  Review of Systems: Unable to obtain review of systems due to underlying dementia  Past Medical History:  Diagnosis Date  . Allergy   . Anxiety   . Arthritis    degenerative oateoarthritis  . Decubital ulcer 11/14/2014  . Dementia   . GERD (gastroesophageal reflux disease)   . Hyperlipidemia   . Hypertension   . Neurogenic urinary  incontinence    since back surgery  . Urinary frequency     Past Surgical History:  Procedure Laterality Date  . ABDOMINAL HYSTERECTOMY    . APPENDECTOMY    . BACK SURGERY  2000  . BONE MARROW BIOPSY N/A 11/22/2014   Procedure: SACRAL BONE BIOPSY PRONE POSITION;  Surgeon: Georganna Skeans, MD;  Location: [place];  Service: General;  Laterality: N/A;  . [place]   biopsy  . CHOLECYSTECTOMY    . COLON SURGERY    . COLONOSCOPY W/ POLYPECTOMY    . ILEO LOOP COLOSTOMY CLOSURE N/A 11/22/2014   Procedure: LAPAROSCOPIC LOOP COLOSTOMY ;  Surgeon: Georganna Skeans, MD;  Location: [place];  Service: General;  Laterality: N/A;  . TOTAL HIP ARTHROPLASTY  01/07/2012   Procedure: TOTAL HIP ARTHROPLASTY;  Surgeon: Yvette Rack., MD;  Location: [place];  Service: Orthopedics;  Laterality: Right;     reports that she quit smoking about 33 years ago. Her smoking use included cigarettes. She has a 5.00 pack-year smoking history. She has never used smokeless tobacco. She reports that she does not drink alcohol or use drugs.  Allergies  Allergen Reactions  . Augmentin [Amoxicillin-Pot Clavulanate] Other (See Comments)    Per MAR  . Codeine Sulfate Other (See Comments)    stomach pains  . Tramadol Hcl Other (See Comments)    stomach cramps    Family History  Problem Relation Age of Onset  .  Diabetes Mother   . Alcohol abuse Unknown        fhx  . Arthritis Unknown        fhx  . Hypertension Unknown        fhx  . Heart disease Unknown        fhx  . Cancer Unknown        colon, lung, prostate/fhx  . Stroke Unknown        fhx  . Depression Unknown        fhx    Prior to Admission medications   Medication Sig Start Date End Date Taking? Authorizing Provider  Amino Acids-Protein Hydrolys (FEEDING SUPPLEMENT, PRO-STAT SUGAR FREE 64,) LIQD Take 30 mLs by mouth 3 (three) times daily with meals.    [provider]  amLODipine (NORVASC) 5 MG tablet Take 5 mg by mouth daily.     [provider]  atorvastatin (LIPITOR) 10 MG tablet Take 10 mg by mouth daily.    [provider]  cloNIDine (CATAPRES) 0.1 MG tablet Take 0.1 mg by mouth 2 (two) times daily.    [provider]  donepezil (ARICEPT) 10 MG tablet Take 1 tablet (10 mg total) by mouth at bedtime. 07/19/14   Burchette, Alinda Sierras, MD  famotidine (PEPCID) 20 MG tablet One at bedtime 03/23/17   Tanda Rockers, MD  feeding supplement (BOOST / RESOURCE BREEZE) LIQD Take 1 Container by mouth 3 (three) times daily between meals. 11/26/14   Janece Canterbury, MD  fluticasone (FLONASE) 50 MCG/ACT nasal spray Place 2 sprays into both nostrils daily.    [provider]  furosemide (LASIX) 20 MG tablet TAKE 1 TABLET (20 MG TOTAL) BY MOUTH DAILY. 10/02/14   Burchette, Alinda Sierras, MD  ipratropium-albuterol (DUONEB) 0.5-2.5 (3) MG/3ML SOLN Take 3 mLs by nebulization every 4 (four) hours as needed.    [provider]  ketoconazole (NIZORAL) 2 % shampoo Apply 1 application topically 2 (two) times a week.    [provider]  loratadine (CLARITIN) 10 MG tablet Take 10 mg by mouth daily.    [provider]  losartan (COZAAR) 50 MG tablet TAKE 1 TABLET BY MOUTH DAILY Patient taking differently: TAKE 50 MG BY MOUTH DAILY 01/14/14   Burchette, Alinda Sierras, MD  metoprolol succinate (TOPROL-XL) 25 MG 24 hr tablet Take 1 tablet (25 mg total) by mouth daily. 04/14/15   Burchette, Alinda Sierras, MD  Multiple Vitamins-Minerals (CERTAGEN) tablet Take 1 tablet by mouth daily.    [provider]  pantoprazole (PROTONIX) 40 MG tablet Take 1 tablet (40 mg total) by mouth daily. Take 30-60 min before first meal of the day 03/23/17   Tanda Rockers, MD  vitamin C (VITAMIN C) 500 MG tablet Take 1 tablet (500 mg total) by mouth 2 (two) times daily. 11/26/14   Janece Canterbury, MD  Vitamin D, Ergocalciferol, (DRISDOL) 50000 units CAPS capsule Take 50,000 Units by mouth every 7 (seven) days.    [provider]  zinc sulfate 220 (50 Zn) MG capsule Take 220 mg by mouth 2 (two) times daily.    [provider]    Physical Exam: Vitals:   09/10/17 1544 09/10/17 1545 09/10/17 1600 09/10/17 1615  BP: (!) 112/92 116/81 108/73 120/76  Pulse: (!) 105 (!) 121 100 93  Resp: (!) 25 18 (!) 22 18  Temp: (!) 100.7 F (38.2 C)     TempSrc: Rectal     SpO2: 94% 94% 96% 94%  Constitutional: NAD, calm, comfortable Eyes: PERRL, lids and conjunctivae normal  ENMT: Mucous membranes are moist.  Poor dentition  Neck: normal, supple Respiratory: clear to auscultation bilaterally, no wheezing, no crackles. Normal respiratory effort. No accessory muscle use.  Cardiovascular: Regular rate and rhythm, no murmurs / rubs / gallops. No extremity edema. 2+ pedal pulses.  Abdomen: no tenderness, no masses palpated. Bowel sounds positive.  Ostomy bag in place Musculoskeletal: no clubbing / cyanosis. Normal muscle tone.  Skin: Large cellulitic area overlying the right hip, margins drawn, no fluctuance noted.  Unstageable sacral decubitus ulcer present, no drainage or surrounding erythema. Neurologic: Does not follow commands consistently however does appears nonfocal and moves all 4 extremities.  Strength appears equal Psychiatric: Alert to person only  Labs on Admission: I have personally reviewed following labs and imaging studies  CBC: Recent Labs  Lab 09/10/17 1600  WBC 13.7*  NEUTROABS 11.2*  HGB 14.0  HCT 44.6  MCV 83.1  PLT 355   Basic Metabolic Panel: Recent Labs  Lab 09/10/17 1600  NA 142  K 3.1*  CL 104  CO2 28  GLUCOSE 122*  BUN 8  CREATININE 0.77  CALCIUM 8.3*   GFR: CrCl cannot be calculated (Unknown ideal weight.). Liver Function Tests: Recent Labs  Lab 09/10/17 1600  AST 22  ALT 13  ALKPHOS 76  BILITOT 0.5  PROT 6.4*  ALBUMIN 2.9*   No results for input(s): LIPASE, AMYLASE in the last 168 hours. No results for input(s): AMMONIA in the last 168  hours. Coagulation Profile: Recent Labs  Lab 09/10/17 1600  INR 1.13   Cardiac Enzymes: No results for input(s): CKTOTAL, CKMB, CKMBINDEX, TROPONINI in the last 168 hours. BNP (last 3 results) No results for input(s): PROBNP in the last 8760 hours. HbA1C: No results for input(s): HGBA1C in the last 72 hours. CBG: No results for input(s): GLUCAP in the last 168 hours. Lipid Profile: No results for input(s): CHOL, HDL, LDLCALC, TRIG, CHOLHDL, LDLDIRECT in the last 72 hours. Thyroid Function Tests: No results for input(s): TSH, T4TOTAL, FREET4, T3FREE, THYROIDAB in the last 72 hours. Anemia Panel: No results for input(s): VITAMINB12, FOLATE, FERRITIN, TIBC, IRON, RETICCTPCT in the last 72 hours. Urine analysis:    Component Value Date/Time   COLORURINE AMBER (A) 05/14/2015 2151   APPEARANCEUR CLOUDY (A) 05/14/2015 2151   LABSPEC 1.018 05/14/2015 2151   PHURINE 5.5 05/14/2015 2151   GLUCOSEU NEGATIVE 05/14/2015 2151   HGBUR NEGATIVE 05/14/2015 2151   HGBUR negative 03/23/2010 0921   BILIRUBINUR NEGATIVE 05/14/2015 2151   BILIRUBINUR neg 10/10/2014 1423   KETONESUR NEGATIVE 05/14/2015 2151   PROTEINUR NEGATIVE 05/14/2015 2151   UROBILINOGEN 0.2 11/24/2014 1127   NITRITE POSITIVE (A) 05/14/2015 2151   LEUKOCYTESUR MODERATE (A) 05/14/2015 2151   Radiological Exams on Admission: Dg Chest Portable 1 View  Result Date: 09/10/2017 CLINICAL DATA:  Sepsis, fever EXAM: PORTABLE CHEST 1 VIEW COMPARISON:  03/23/2017 chest radiograph. FINDINGS: Stable cardiomediastinal silhouette with normal heart size. No pneumothorax. No pleural effusion. No pulmonary edema. Mild left basilar scarring versus atelectasis. No acute consolidative airspace disease. IMPRESSION: Mild left basilar scarring versus atelectasis. Otherwise no active disease. Electronically Signed   By: Ilona Sorrel M.D.   On: 09/10/2017 16:59    EKG: Independently reviewed. Sinus tachycardia   Assessment/Plan Active Problems:    Essential hypertension   Protein-calorie malnutrition, severe (HCC)   Colostomy in place [facility])   Sacral decubitus ulcer   Sepsis ([place])   Sepsis likely  due to cellulitis  -Also with potential urinary tract infection, however urinalysis is pending at that time.  She does have a large area overlying her right hip which is bright red and appears cellulitic.  Margins drawn in the emergency room. -Continue vancomycin and Zosyn which was already started in the ED -Blood cultures obtained, pending -chronic foley changed in the ED  Stage IV sacral decubitus ulcer -Wound care consulted, this wound does not appear to be actively infected -has a diverting ostomy to allow healing  Advanced dementia -Continue home medications. Essentially bedbound now  Severe protein calorie malnutrition -We will consult dietary.  Also consult speech given advanced dementia, poor dentition as to determine appropriate diet.  HTN -hold most of her home meds in the setting of sepsis, resume in am if stable  Hypokalemia -possible due to nausea/vomiting poor po intake, replete and recheck in am   Nausea/vomiting -resolved, has not had any further episodes since yesterday    DVT prophylaxis: Lovenox  Code Status: DNR  Family Communication: husband is present at bedside  Disposition Plan: admit to [place] called: none     Admission status: inpatient    At the time of admission, it appears that the appropriate admission status for this patient is INPATIENT. This is judged to be reasonable and necessary in order to provide the required high service intensity to ensure the patient's safety given the presenting symptoms, physical exam findings, and initial radiographic and laboratory data in the context of their chronic comorbidities. Current circumstances are sepsis due to cellulitis needing at least 48h if IV antibiotics, and it is felt to place patient at high risk for further clinical deterioration  threatening life, limb, or organ. Moreover, it is my clinical judgment that the patient will require inpatient hospital care spanning beyond 2 midnights from the point of admission and that early discharge would result in unnecessary risk of decompensation and readmission or threat to life, limb or bodily function.   Marzetta Board, MD [facility] Pager [phone]  If 7PM-7AM, please contact night-coverage [email or web address] Password [facility]  09/10/2017, 5:45 PM

## 2017-09-10 NOTE — Progress Notes (Signed)
Pharmacy Antibiotic Note  [name] FlavinShirley M Frank is a 83 y.o. female admitted on 09/10/2017 from SNF with fever. Starting empiric antibiotics for cellulitis vs UTI. SCr 0.7, WBC 13.7, low grade fever.   Plan: -Vancomycin 500 mg IV q12h -Zosyn 3.375 g IV q8h -Monitor renal fx, cultures, VT as needed     Temp (24hrs), Avg:100.7 F (38.2 C), Min:100.7 F (38.2 C), Max:100.7 F (38.2 C)  Recent Labs  Lab 09/10/17 1600 09/10/17 1601  WBC 13.7*  --   CREATININE 0.77  --   LATICACIDVEN  --  1.25      Antimicrobials this admission: 6/29 vancomycin > 6/29 zosyn >  Dose adjustments this admission: N/A  Microbiology results: 6/29 blood cx: 6/29 urine cx:   Baldemar FridayMasters, Matayah Reyburn M 09/10/2017 5:52 PM

## 2017-09-10 NOTE — ED Provider Notes (Signed)
[name] EMERGENCY DEPARTMENT Provider Note   CSN: [id number] Arrival date & time: 09/10/17  1527     History   Chief Complaint Chief Complaint  Patient presents with  . Fever     HPI [name] Frank is a 83 y.o. female.  HPI   83 year old female sent for evaluation of decreased mental status and fever.    History is primarily from her husband and review of records.  He reports fevers for the past two days.  She has been much more tired than usual.  She has a chronic Foley.  He is unsure the last time was changed.  She is also had vomiting.  She has not complained of anything specifically to him, but she does have dementia.  Also chronic wound to sacrum which is followed at the wound center.  Past Medical History:  Diagnosis Date  . Allergy   . Anxiety   . Arthritis    degenerative oateoarthritis  . Decubital ulcer 11/14/2014  . Dementia   . GERD (gastroesophageal reflux disease)   . Hyperlipidemia   . Hypertension   . Neurogenic urinary incontinence    since back surgery  . Urinary frequency     Patient Active Problem List   Diagnosis Date Noted  . Chronic cough 03/23/2017  . Failure to thrive in adult 05/16/2015  . Sacral decubitus ulcer 05/14/2015  . Malnutrition of moderate degree 05/01/2015  . Wound infection 04/29/2015  . Colostomy in place [facility]) 01/06/2015  . Catheter-associated urinary tract infection ([place]) 11/26/2014  . Osteomyelitis due to staphylococcus aureus ([place]) 11/25/2014  . Bacteremia 11/25/2014  . Postprocedural fever 11/24/2014  . UTI (urinary tract infection) 11/24/2014  . Palliative care encounter 11/22/2014  . DNR (do not resuscitate) discussion 11/22/2014  . Osteomyelitis, pelvic region and thigh ([place])   . Goals of care, counseling/discussion   . Enterococcus faecalis infection   . Protein-calorie malnutrition, severe ([place]) 11/15/2014  . Decubitus ulcer of sacral region, stage 4 ([place]) 11/14/2014  . Stage IV  decubitus ulcer ([place]) 11/14/2014  . Cognitive changes 06/19/2014  . GERD (gastroesophageal reflux disease) 08/17/2013  . Postoperative anemia due to acute blood loss 01/08/2012  . Osteoarthritis 09/23/2010  . Lumbago 03/23/2010  . OVERACTIVE BLADDER 12/16/2009  . EDEMA 04/25/2009  . DEGENERATIVE JOINT DISEASE, HIPS 09/20/2008  . HYPERLIPIDEMIA 07/10/2008  . Essential hypertension 07/10/2008  . URINARY INCONTINENCE 07/10/2008    Past Surgical History:  Procedure Laterality Date  . ABDOMINAL HYSTERECTOMY    . APPENDECTOMY    . BACK SURGERY  2000  . BONE MARROW BIOPSY N/A 11/22/2014   Procedure: SACRAL BONE BIOPSY PRONE POSITION;  Surgeon: Georganna Skeans, MD;  Location: [place];  Service: General;  Laterality: N/A;  . [place]   biopsy  . CHOLECYSTECTOMY    . COLON SURGERY    . COLONOSCOPY W/ POLYPECTOMY    . ILEO LOOP COLOSTOMY CLOSURE N/A 11/22/2014   Procedure: LAPAROSCOPIC LOOP COLOSTOMY ;  Surgeon: Georganna Skeans, MD;  Location: [place];  Service: General;  Laterality: N/A;  . TOTAL HIP ARTHROPLASTY  01/07/2012   Procedure: TOTAL HIP ARTHROPLASTY;  Surgeon: Yvette Rack., MD;  Location: [place];  Service: Orthopedics;  Laterality: Right;     OB History   None      Home Medications    Prior to Admission medications   Medication Sig Start Date End Date Taking? Authorizing Provider  Amino Acids-Protein Hydrolys (FEEDING SUPPLEMENT, PRO-STAT SUGAR  FREE 64,) LIQD Take 30 mLs by mouth 3 (three) times daily with meals.    [provider]  amLODipine (NORVASC) 5 MG tablet Take 5 mg by mouth daily.    [provider]  atorvastatin (LIPITOR) 10 MG tablet Take 10 mg by mouth daily.    [provider]  cloNIDine (CATAPRES) 0.1 MG tablet Take 0.1 mg by mouth 2 (two) times daily.    [provider]  donepezil (ARICEPT) 10 MG tablet Take 1 tablet (10 mg total) by mouth at bedtime. 07/19/14   Burchette, Alinda Sierras, MD  famotidine (PEPCID) 20 MG  tablet One at bedtime 03/23/17   Tanda Rockers, MD  feeding supplement (BOOST / RESOURCE BREEZE) LIQD Take 1 Container by mouth 3 (three) times daily between meals. 11/26/14   Janece Canterbury, MD  fluticasone (FLONASE) 50 MCG/ACT nasal spray Place 2 sprays into both nostrils daily.    [provider]  furosemide (LASIX) 20 MG tablet TAKE 1 TABLET (20 MG TOTAL) BY MOUTH DAILY. 10/02/14   Burchette, Alinda Sierras, MD  ipratropium-albuterol (DUONEB) 0.5-2.5 (3) MG/3ML SOLN Take 3 mLs by nebulization every 4 (four) hours as needed.    [provider]  ketoconazole (NIZORAL) 2 % shampoo Apply 1 application topically 2 (two) times a week.    [provider]  loratadine (CLARITIN) 10 MG tablet Take 10 mg by mouth daily.    [provider]  losartan (COZAAR) 50 MG tablet TAKE 1 TABLET BY MOUTH DAILY Patient taking differently: TAKE 50 MG BY MOUTH DAILY 01/14/14   Burchette, Alinda Sierras, MD  metoprolol succinate (TOPROL-XL) 25 MG 24 hr tablet Take 1 tablet (25 mg total) by mouth daily. 04/14/15   Burchette, Alinda Sierras, MD  Multiple Vitamins-Minerals (CERTAGEN) tablet Take 1 tablet by mouth daily.    [provider]  pantoprazole (PROTONIX) 40 MG tablet Take 1 tablet (40 mg total) by mouth daily. Take 30-60 min before first meal of the day 03/23/17   Tanda Rockers, MD  vitamin C (VITAMIN C) 500 MG tablet Take 1 tablet (500 mg total) by mouth 2 (two) times daily. 11/26/14   Janece Canterbury, MD  Vitamin D, Ergocalciferol, (DRISDOL) 50000 units CAPS capsule Take 50,000 Units by mouth every 7 (seven) days.    [provider]  zinc sulfate 220 (50 Zn) MG capsule Take 220 mg by mouth 2 (two) times daily.    [provider]    Family History Family History  Problem Relation Age of Onset  . Diabetes Mother   . Alcohol abuse Unknown        fhx  . Arthritis Unknown        fhx  . Hypertension Unknown        fhx  . Heart disease Unknown        fhx  . Cancer  Unknown        colon, lung, prostate/fhx  . Stroke Unknown        fhx  . Depression Unknown        fhx    Social History Social History   Tobacco Use  . Smoking status: Former Smoker    Packs/day: 0.50    Years: 10.00    Pack years: 5.00    Types: Cigarettes    Last attempt to quit: 09/23/1983    Years since quitting: 33.9  . Smokeless tobacco: Never Used  Substance Use Topics  . Alcohol use: No  . Drug use:  No     Allergies   Augmentin [amoxicillin-pot clavulanate]; Codeine sulfate; and Tramadol hcl   Review of Systems Review of Systems  Level 5 caveat because of dementia. Physical Exam Updated Vital Signs BP (!) 112/92 (BP Location: Left Arm)   Pulse (!) 105   Temp (!) 100.7 F (38.2 C) (Rectal)   Resp (!) 25   SpO2 94%   Physical Exam  Constitutional: She appears well-developed and well-nourished. No distress.  HENT:  Head: Normocephalic and atraumatic.  Eyes: Conjunctivae are normal. Right eye exhibits no discharge. Left eye exhibits no discharge.  Neck: Neck supple.  Cardiovascular: Normal rate, regular rhythm and normal heart sounds. Exam reveals no gallop and no friction rub.  No murmur heard. Pulmonary/Chest: Effort normal and breath sounds normal. No respiratory distress.  Abdominal: Soft. She exhibits no distension. There is no tenderness.  Genitourinary:  Genitourinary Comments: Foley to leg bag  Musculoskeletal: She exhibits no edema or tenderness.  Neurological:  Drowsy.  Will open eyes to pain.  Will withdraw her extremities and moan but is not following commands.  Skin: Skin is warm and dry.  Erythema which seems to be centered around well-healed right hip surgical scar.  There is some induration towards the anterior aspect of it.  No fluctuance.  Sacral decubitus ulcer.  No drainage.  Right hip skin changes are not contiguous with this wound.   Nursing note and vitals reviewed.    ED Treatments / Results  Labs (all labs ordered are  listed, but only abnormal results are displayed) Labs Reviewed  URINE CULTURE - Abnormal; Notable for the following components:      Result Value   Culture MULTIPLE SPECIES PRESENT, SUGGEST RECOLLECTION (*)    All other components within normal limits  COMPREHENSIVE METABOLIC PANEL - Abnormal; Notable for the following components:   Potassium 3.1 (*)    Glucose, Bld 122 (*)    Calcium 8.3 (*)    Total Protein 6.4 (*)    Albumin 2.9 (*)    All other components within normal limits  CBC WITH DIFFERENTIAL/PLATELET - Abnormal; Notable for the following components:   WBC 13.7 (*)    RBC 5.37 (*)    RDW 16.2 (*)    Neutro Abs 11.2 (*)    All other components within normal limits  URINALYSIS, ROUTINE W REFLEX MICROSCOPIC - Abnormal; Notable for the following components:   APPearance CLOUDY (*)    Specific Gravity, Urine 1.038 (*)    Hgb urine dipstick SMALL (*)    Ketones, ur 5 (*)    Protein, ur 100 (*)    Leukocytes, UA MODERATE (*)    RBC / HPF >50 (*)    WBC, UA >50 (*)    Bacteria, UA RARE (*)    All other components within normal limits  COMPREHENSIVE METABOLIC PANEL - Abnormal; Notable for the following components:   Sodium 146 (*)    Calcium 8.4 (*)    Total Protein 5.7 (*)    Albumin 2.6 (*)    All other components within normal limits  CBC - Abnormal; Notable for the following components:   WBC 16.4 (*)    RDW 16.4 (*)    All other components within normal limits  CULTURE, BLOOD (ROUTINE X 2)  CULTURE, BLOOD (ROUTINE X 2)  MRSA PCR SCREENING  PROTIME-INR  CBC  BASIC METABOLIC PANEL  VANCOMYCIN, TROUGH  I-STAT CG4 LACTIC ACID, ED  I-STAT TROPONIN, ED    EKG EKG  Interpretation  Date/Time:  Saturday September 10 2017 15:30:30 EDT Ventricular Rate:  105 PR Interval:    QRS Duration: 90 QT Interval:  347 QTC Calculation: 459 R Axis:   -4 Text Interpretation:  Sinus tachycardia Biatrial enlargement Probable left ventricular hypertrophy Confirmed by Virgel Manifold  [phone]) on 09/10/2017 3:45:56 PM   Radiology No results found.  Procedures Procedures (including critical care time)  Medications Ordered in ED Medications  vancomycin (VANCOCIN) 1,250 mg in sodium chloride 0.9 % 250 mL IVPB (has no administration in time range)  piperacillin-tazobactam (ZOSYN) IVPB 3.375 g (has no administration in time range)  acetaminophen (TYLENOL) tablet 650 mg (has no administration in time range)     Initial Impression / Assessment and Plan / ED Course  I have reviewed the triage vital signs and the nursing notes.  Pertinent labs & imaging results that were available during my care of the patient were reviewed by me and considered in my medical decision making (see chart for details).     83 year old female with increased drowsiness.  She is febrile.  Empiric antibiotics for sepsis.  Possible UTI with chronic Foley.  She also appears to have cellulitis to the right hip.  Tunneling wound of sacrum but it does not appear overtly infected.  Final Clinical Impressions(s) / ED Diagnoses   Final diagnoses:  Cellulitis of right hip    ED Discharge Orders    None       Virgel Manifold, MD 09/12/17 [phone]

## 2017-09-10 NOTE — ED Triage Notes (Signed)
Pt from nursing facility with fever of 101.2, nursing facility is questioning a possible UTI.

## 2017-09-11 DIAGNOSIS — L89154 Pressure ulcer of sacral region, stage 4: Secondary | ICD-10-CM | POA: Diagnosis not present

## 2017-09-11 DIAGNOSIS — A419 Sepsis, unspecified organism: Secondary | ICD-10-CM | POA: Diagnosis not present

## 2017-09-11 DIAGNOSIS — I10 Essential (primary) hypertension: Secondary | ICD-10-CM | POA: Diagnosis not present

## 2017-09-11 DIAGNOSIS — E43 Unspecified severe protein-calorie malnutrition: Secondary | ICD-10-CM | POA: Diagnosis not present

## 2017-09-11 DIAGNOSIS — N390 Urinary tract infection, site not specified: Secondary | ICD-10-CM | POA: Diagnosis not present

## 2017-09-11 DIAGNOSIS — L03115 Cellulitis of right lower limb: Secondary | ICD-10-CM | POA: Diagnosis not present

## 2017-09-11 LAB — URINE CULTURE

## 2017-09-11 LAB — COMPREHENSIVE METABOLIC PANEL
ALBUMIN: 2.6 g/dL — AB (ref 3.5–5.0)
ALK PHOS: 70 U/L (ref 38–126)
ALT: 12 U/L (ref 0–44)
AST: 20 U/L (ref 15–41)
Anion gap: 10 (ref 5–15)
BUN: 8 mg/dL (ref 8–23)
CHLORIDE: 108 mmol/L (ref 98–111)
CO2: 28 mmol/L (ref 22–32)
CREATININE: 0.67 mg/dL (ref 0.44–1.00)
Calcium: 8.4 mg/dL — ABNORMAL LOW (ref 8.9–10.3)
GFR calc Af Amer: >60 mL/min (ref >60)
GFR calc non Af Amer: >60 mL/min (ref >60)
GLUCOSE: 93 mg/dL (ref 70–99)
Potassium: 3.5 mmol/L (ref 3.5–5.1)
SODIUM: 146 mmol/L — AB (ref 135–145)
Total Bilirubin: 0.7 mg/dL (ref 0.3–1.2)
Total Protein: 5.7 g/dL — ABNORMAL LOW (ref 6.5–8.1)

## 2017-09-11 LAB — CBC
HCT: 42.4 % (ref 36.0–46.0)
Hemoglobin: 13.2 g/dL (ref 12.0–15.0)
MCH: 26.1 pg (ref 26.0–34.0)
MCHC: 31.1 g/dL (ref 30.0–36.0)
MCV: 84 fL (ref 78.0–100.0)
PLATELETS: 199 10*3/uL (ref 150–400)
RBC: 5.05 MIL/uL (ref 3.87–5.11)
RDW: 16.4 % — ABNORMAL HIGH (ref 11.5–15.5)
WBC: 16.4 10*3/uL — ABNORMAL HIGH (ref 4.0–10.5)

## 2017-09-11 MED ORDER — LOSARTAN POTASSIUM 50 MG PO TABS
50.0000 mg | ORAL_TABLET | Freq: Every day | ORAL | Status: DC
  Administered 2017-09-12 – 2017-09-15 (×4): 50 mg via ORAL
  Filled 2017-09-11 (×4): qty 1

## 2017-09-11 MED ORDER — CLONIDINE HCL 0.1 MG PO TABS
0.1000 mg | ORAL_TABLET | Freq: Two times a day (BID) | ORAL | Status: DC
  Administered 2017-09-11 – 2017-09-15 (×8): 0.1 mg via ORAL
  Filled 2017-09-11 (×8): qty 1

## 2017-09-11 MED ORDER — AMLODIPINE BESYLATE 5 MG PO TABS
5.0000 mg | ORAL_TABLET | Freq: Every day | ORAL | Status: DC
  Administered 2017-09-12 – 2017-09-15 (×4): 5 mg via ORAL
  Filled 2017-09-11 (×4): qty 1

## 2017-09-11 NOTE — Evaluation (Signed)
Physical Therapy Evaluation Patient Details Name: [name] Frank MRN: [id number] DOB: 03-14-1935 Today's Date: 09/11/2017   History of Present Illness  [name] Frank is a 83 y.o. female with medical history significant of advanced dementia, HTN, HLD, chronic sacral wound, who is being brought from her long term SNF due to decreased appetite, nausea / vomiting the day prior and fever.  Clinical Impression   Pt admitted with above diagnosis. Pt currently with functional limitations due to the deficits listed below (see PT Problem List). Presents with decr strength, decr overall functional mobility, decr cognition (likely at baseline); per chart review, pt is largely bed bound at SNF -- Family is interested in her moving to a different facility;  Pt will benefit from skilled PT to continue assessment of activity tolerance, consider more options for positioning, decr caregiver burden;   And to allow discharge to the venue listed below.       Follow Up Recommendations SNF    Equipment Recommendations  Other (comment);Wheelchair (measurements PT);Wheelchair cushion (measurements PT)(lift equipment, air mattress overlay)    Recommendations for Other Services       Precautions / Restrictions Precautions Precautions: Fall      Mobility  Bed Mobility Overal bed mobility: Needs Assistance Bed Mobility: Rolling Rolling: Total assist         General bed mobility comments: Cues to initiate by looking to side she's tolling to (follwed that command); hand over hand to assist with reaching for rail; total assist to complete roll to L and R; Assisted nursing to transfer patient to air mattress bed via drawsheet technique  Transfers                    Ambulation/Gait                Stairs            Wheelchair Mobility    Modified Rankin (Stroke Patients Only)       Balance                                             Pertinent  Vitals/Pain Pain Assessment: Faces Faces Pain Scale: Hurts little more Pain Location: Noted grimace with ankle dorsiflexion stretch, ROM assessment of LEs Pain Descriptors / Indicators: Grimacing Pain Intervention(s): Monitored during session    Home Living Family/patient expects to be discharged to:: Skilled nursing facility                      Prior Function Level of Independence: Needs assistance         Comments: Pt unable to give information; per chart review, pt is largely bedbound     Hand Dominance        Extremity/Trunk Assessment   Upper Extremity Assessment Upper Extremity Assessment: Difficult to assess due to impaired cognition    Lower Extremity Assessment Lower Extremity Assessment: RLE deficits/detail;LLE deficits/detail;Difficult to assess due to impaired cognition RLE Deficits / Details: Assessed in supine position; Resistence noted with PROM into hip and knee flexion; decr ankle df ROM, able to passively stretch to 20 degrees shy of neutral LLE Deficits / Details: Assessed in supine position; Resistence noted with PROM into hip and knee flexion; decr ankle df ROM, able to passively stretch to 20 degrees shy of neutral       Communication  Communication: Expressive difficulties  Cognition Arousal/Alertness: Awake/alert Behavior During Therapy: Flat affect Overall Cognitive Status: No family/caregiver present to determine baseline cognitive functioning                                 General Comments: Very likely at baseline      General Comments      Exercises     Assessment/Plan    PT Assessment Patient needs continued PT services(pending abiltiy to participate; likely one more session to assess for tolerance of sitting positions)  PT Problem List Decreased strength;Decreased range of motion;Decreased activity tolerance;Decreased balance;Decreased mobility;Decreased coordination;Decreased cognition;Decreased knowledge  of use of DME;Decreased safety awareness;Decreased knowledge of precautions       PT Treatment Interventions Functional mobility training;Therapeutic activities;Therapeutic exercise;Balance training;Neuromuscular re-education;Cognitive remediation;Patient/family education    PT Goals (Current goals can be found in the Care Plan section)  Acute Rehab PT Goals PT Goal Formulation: Patient unable to participate in goal setting Time For Goal Achievement: 09/25/17 Potential to Achieve Goals: Fair Additional Goals Additional Goal #1: Pt will tolerate being out of bed in the recliner for at least an hour    Frequency Min 2X/week(Likely one more session)   Barriers to discharge        Co-evaluation               AM-PAC PT "6 Clicks" Daily Activity  Outcome Measure Difficulty turning over in bed (including adjusting bedclothes, sheets and blankets)?: Unable Difficulty moving from lying on back to sitting on the side of the bed? : Unable Difficulty sitting down on and standing up from a chair with arms (e.g., wheelchair, bedside commode, etc,.)?: Unable Help needed moving to and from a bed to chair (including a wheelchair)?: Total Help needed walking in hospital room?: Total Help needed climbing 3-5 steps with a railing? : Total 6 Click Score: 6    End of Session Equipment Utilized During Treatment: Other (comment)(bed pad) Activity Tolerance: Patient tolerated treatment well Patient left: in bed;with call bell/phone within reach;with nursing/sitter in room;with bed alarm set Nurse Communication: Mobility status PT Visit Diagnosis: Muscle weakness (generalized) (M62.81);Other abnormalities of gait and mobility (R26.89);Adult, failure to thrive (R62.7)    Time: 1021-1044 PT Time Calculation (min) (ACUTE ONLY): 23 min   Charges:   PT Evaluation $PT Eval Moderate Complexity: 1 Mod PT Treatments $Therapeutic Activity: 8-22 mins   PT G Codes:        Van ClinesHolly Deeya Richeson, PT   Acute Rehabilitation Services Pager [phone]   Levi AlandHolly H Haleigh Desmith 09/11/2017, 1:19 PM

## 2017-09-11 NOTE — Progress Notes (Signed)
[facility] PROGRESS NOTE  Wilma FlavinShirley M Dattilo AVW:[id number] DOB: 11/22/1934 DOA: 09/10/2017  PCP: Kristian CoveyBurchette, Bruce W, MD  Brief History/Interval Summary: 83 year old American female with a past medical history of advanced dementia, essential hypertension, hyperlipidemia, chronic sacral wound, diabetic colostomy presented from skilled nursing facility due to decreased appetite nausea vomiting and fever.  There was concern for a urinary tract infection along with cellulitis in the right thigh area.  Her sacral wound was noted to be stable without active signs of infection.  Patient was hospitalized for further management.  Reason for Visit: Urinary tract infection.  Cellulitis involving right lower extremity in the thigh area  Consultants: None  Procedures: None  Antibiotics: Vancomycin and Zosyn  Subjective/Interval History: Patient pleasantly confused.  Unable to answer questions.  ROS: Unable to do due to her dementia  Objective:  Vital Signs  Vitals:   09/10/17 1852 09/10/17 2300 09/11/17 0510 09/11/17 1019  BP: 134/73 (!) 154/94 (!) 161/88 (!) 160/80  Pulse: 72 75 (!) 107 100  Resp: 20 20    Temp: 97.7 F (36.5 C) 98.3 F (36.8 C) 98.2 F (36.8 C)   TempSrc: Axillary Axillary Oral   SpO2: 100% 100% 97%   Weight: 68.1 kg (150 lb 2.1 oz)       Intake/Output Summary (Last 24 hours) at 09/11/2017 1152 Last data filed at 09/11/2017 0500 Gross per 24 hour  Intake 1699.69 ml  Output -  Net 1699.69 ml   Filed Weights   09/10/17 1745 09/10/17 1852  Weight: 65.3 kg (144 lb) 68.1 kg (150 lb 2.1 oz)    General appearance: alert, distracted and no distress Head: Normocephalic, without obvious abnormality, atraumatic Resp: Normal effort at rest.  Diminished air entry at the bases.  No wheezing rales or rhonchi. Cardio: regular rate and rhythm, S1, S2 normal, no murmur, click, rub or gallop GI: Abdomen is soft.  Colostomy is noted with yellow stool.  Nontender.   Bowel sounds present. Extremities: Erythematous area noted in the right lateral thigh. warm To touch. Neurologic: Disoriented.  No obvious focal neurological deficits.  Lab Results:  Data Reviewed: I have personally reviewed following labs and imaging studies  CBC: Recent Labs  Lab 09/10/17 1600 09/11/17 0700  WBC 13.7* 16.4*  NEUTROABS 11.2*  --   HGB 14.0 13.2  HCT 44.6 42.4  MCV 83.1 84.0  PLT 217 199    Basic Metabolic Panel: Recent Labs  Lab 09/10/17 1600 09/11/17 0700  NA 142 146*  K 3.1* 3.5  CL 104 108  CO2 28 28  GLUCOSE 122* 93  BUN 8 8  CREATININE 0.77 0.67  CALCIUM 8.3* 8.4*    GFR: Estimated Creatinine Clearance: 50.5 mL/min (by C-G formula based on SCr of 0.67 mg/dL).  Liver Function Tests: Recent Labs  Lab 09/10/17 1600 09/11/17 0700  AST 22 20  ALT 13 12  ALKPHOS 76 70  BILITOT 0.5 0.7  PROT 6.4* 5.7*  ALBUMIN 2.9* 2.6*    Coagulation Profile: Recent Labs  Lab 09/10/17 1600  INR 1.13     Recent Results (from the past 240 hour(s))  MRSA PCR Screening     Status: None   Collection Time: 09/10/17  7:02 PM  Result Value Ref Range Status   MRSA by PCR NEGATIVE NEGATIVE Final    Comment:        The GeneXpert MRSA Assay (FDA approved for NASAL specimens only), is one component of a comprehensive MRSA colonization surveillance program. It is  not intended to diagnose MRSA infection nor to guide or monitor treatment for MRSA infections. Performed at [facility], [address] [place], [place] [address]       Radiology Studies: Dg Chest Portable 1 View  Result Date: 09/10/2017 CLINICAL DATA:  Sepsis, fever EXAM: PORTABLE CHEST 1 VIEW COMPARISON:  03/23/2017 chest radiograph. FINDINGS: Stable cardiomediastinal silhouette with normal heart size. No pneumothorax. No pleural effusion. No pulmonary edema. Mild left basilar scarring versus atelectasis. No acute consolidative airspace disease. IMPRESSION: Mild left basilar  scarring versus atelectasis. Otherwise no active disease. Electronically Signed   By: Delbert Phenix M.D.   On: 09/10/2017 16:59     Medications:  Scheduled: . atorvastatin  10 mg Oral Daily  . donepezil  10 mg Oral QHS  . enoxaparin (LOVENOX) injection  40 mg Subcutaneous Q24H  . famotidine  20 mg Oral Daily  . feeding supplement (PRO-STAT SUGAR FREE 64)  30 mL Oral TID WC  . fluticasone  2 spray Each Nare Daily  . lactose free nutrition  1 Container Oral TID BM  . loratadine  10 mg Oral Daily  . metoprolol succinate  25 mg Oral Daily  . multivitamin with minerals  1 tablet Oral Daily  . pantoprazole  40 mg Oral Daily  . ascorbic acid  500 mg Oral BID  . Vitamin D (Ergocalciferol)  50,000 Units Oral Q7 days  . zinc sulfate  220 mg Oral BID   Continuous: . piperacillin-tazobactam (ZOSYN)  IV 3.375 g (09/11/17 0630)  . vancomycin 500 mg (09/11/17 0336)   UEA:[id number] **OR** acetaminophen, ipratropium-albuterol, ondansetron **OR** ondansetron (ZOFRAN) IV  Assessment/Plan:    Sepsis likely due to cellulitis versus UTI Sepsis physiology appears to have improved.  Continue current antibiotics for now.  Follow-up on cultures.  Area of concern in the right thigh appears to be stable.  Has not extended beyond the margins drawn yesterday.  Abnormal UA possible UTI She has a chronic Foley.  She could be colonized.  Foley catheter was changed yesterday.  Follow-up on urine cultures.  Stage IV sacral decubitus Wound was examined in the emergency department.  Does not appear to be actively infected.  Patient has a diverting colostomy.  Wound care nurse to see.  Advanced dementia/oropharyngeal dysphagia Seems to be at her baseline.  Continue home medications.  Speech therapy to see.  Currently on dysphagia 2 diet.  Severe protein calorie malnutrition Needs nutritional supplements.  Essential hypertension Her antihypertensive medications were held due to sepsis.  She has been  hypertensive through the course of the night.  She is noted to be on amlodipine, clonidine and losartan at home.  The metoprolol is already on board.  Resume her amlodipine and clonidine today.  Could be experiencing some rebound hypertension.  Nausea and vomiting Appears to have resolved.  Continue to watch.  Hypokalemia Repleted.  DVT Prophylaxis: Lovenox    Code Status: DNR Family Communication: No family at bedside Disposition Plan: Management as outlined above.    LOS: 1 day   Osvaldo Shipper  [facility] Pager [phone] 09/11/2017, 11:52 AM  If 7PM-7AM, please contact night-coverage at [email or web address], password [facility]

## 2017-09-11 NOTE — Consult Note (Signed)
WOC Nurse wound consult note Reason for Consult: Coccygeal Stage 4 pressure injury (bone palpable) Wound type:Pressure Pressure Injury POA: Yes Measurement: 4cm x 0.4cm x 1.8cm Wound bed: red, wet Drainage (amount, consistency, odor) serosanguinous, small amount Periwound: macerated, evidence of previous wound healing (contaraction, scarring). Dressing procedure/placement/frequency: I will implement a POC aimed at controlling the bleeding and the provision of a therapeutically moist but not wet wound environment. This entails use of a daily calcium alginate (Algisite) wound dressing and turning side to side per our house protocol. Patient will benefit from [facility] elevation no higher than a 30-degree angle to prevent shear force, but I realize this is complicated by patient's pocketing of food during and following meals and the subsequent risk for aspiration. I will provide a mattress replacement with low air loss feature for control of the microclimate and pressure redistirbution (patient is currently using a powered external female urinary incontinence collection device (PurWick)). Bilateral pressure redistribution heel boots and a pressure redistribution chair pad are also provided for downstream use.  WOC Nurse ostomy consult note Stoma type/location: LLQ colostomy (long term, chronic) Stomal assessment/size: viewed through intact pouch, approximately 1 and 1/8 inch Peristomal assessment: not seen today Treatment options for stomal/peristomal skin: none indicated. Patient with parastomal hernia. Output: small amount of brown soft stool in pouch, no flatus Ostomy pouching: 1pc. flat pouching system [place] # [id number]) to accommodate parastomal hernia  Education provided: None.  Patient does not participate in her care.  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 Enrolled patient in [employer] Secure Start DC program: No. Patient resides  in a SNF.  Palliative Care can been consulted for GOC.   WOC nursing team will not follow, but will remain available to this patient, the nursing and medical teams.  Please re-consult if needed. Thanks, Ladona MowLaurie Annabelle Rexroad, MSN, RN, GNP, Hans EdenCWOCN, CWON-AP, FAAN  Pager# [phone]

## 2017-09-11 NOTE — Evaluation (Signed)
SLP Cancellation Note  Patient Details Name: [name] Frank MRN: [id number] DOB: Feb 17, 1935   Cancelled treatment:       Reason Eval/Treat Not Completed: Other (comment)(pt with wound care at this time, will continue efforts)   Chales AbrahamsKimball, Aroura Vasudevan Ann 09/11/2017, 11:05 AM

## 2017-09-12 DIAGNOSIS — A419 Sepsis, unspecified organism: Secondary | ICD-10-CM | POA: Diagnosis present

## 2017-09-12 DIAGNOSIS — Z66 Do not resuscitate: Secondary | ICD-10-CM | POA: Diagnosis not present

## 2017-09-12 DIAGNOSIS — Z933 Colostomy status: Secondary | ICD-10-CM | POA: Diagnosis not present

## 2017-09-12 DIAGNOSIS — R1312 Dysphagia, oropharyngeal phase: Secondary | ICD-10-CM | POA: Diagnosis not present

## 2017-09-12 DIAGNOSIS — E43 Unspecified severe protein-calorie malnutrition: Secondary | ICD-10-CM | POA: Diagnosis not present

## 2017-09-12 DIAGNOSIS — S31000A Unspecified open wound of lower back and pelvis without penetration into retroperitoneum, initial encounter: Secondary | ICD-10-CM

## 2017-09-12 DIAGNOSIS — I10 Essential (primary) hypertension: Secondary | ICD-10-CM | POA: Diagnosis not present

## 2017-09-12 DIAGNOSIS — N390 Urinary tract infection, site not specified: Secondary | ICD-10-CM

## 2017-09-12 DIAGNOSIS — E785 Hyperlipidemia, unspecified: Secondary | ICD-10-CM | POA: Diagnosis not present

## 2017-09-12 DIAGNOSIS — K219 Gastro-esophageal reflux disease without esophagitis: Secondary | ICD-10-CM | POA: Diagnosis not present

## 2017-09-12 DIAGNOSIS — L89154 Pressure ulcer of sacral region, stage 4: Secondary | ICD-10-CM | POA: Diagnosis not present

## 2017-09-12 DIAGNOSIS — Z885 Allergy status to narcotic agent status: Secondary | ICD-10-CM | POA: Diagnosis not present

## 2017-09-12 DIAGNOSIS — F0390 Unspecified dementia without behavioral disturbance: Secondary | ICD-10-CM | POA: Diagnosis not present

## 2017-09-12 DIAGNOSIS — R5381 Other malaise: Secondary | ICD-10-CM | POA: Diagnosis not present

## 2017-09-12 DIAGNOSIS — Z881 Allergy status to other antibiotic agents status: Secondary | ICD-10-CM | POA: Diagnosis not present

## 2017-09-12 DIAGNOSIS — Z6824 Body mass index (BMI) 24.0-24.9, adult: Secondary | ICD-10-CM | POA: Diagnosis not present

## 2017-09-12 DIAGNOSIS — Z87891 Personal history of nicotine dependence: Secondary | ICD-10-CM | POA: Diagnosis not present

## 2017-09-12 DIAGNOSIS — L03115 Cellulitis of right lower limb: Secondary | ICD-10-CM | POA: Diagnosis not present

## 2017-09-12 DIAGNOSIS — Z96641 Presence of right artificial hip joint: Secondary | ICD-10-CM | POA: Diagnosis not present

## 2017-09-12 DIAGNOSIS — Z886 Allergy status to analgesic agent status: Secondary | ICD-10-CM | POA: Diagnosis not present

## 2017-09-12 HISTORY — DX: Unspecified open wound of lower back and pelvis without penetration into retroperitoneum, initial encounter: S31.000A

## 2017-09-12 LAB — CBC
HCT: 40.6 % (ref 36.0–46.0)
HEMOGLOBIN: 13.2 g/dL (ref 12.0–15.0)
MCH: 26.4 pg (ref 26.0–34.0)
MCHC: 32.5 g/dL (ref 30.0–36.0)
MCV: 81.2 fL (ref 78.0–100.0)
Platelets: 190 10*3/uL (ref 150–400)
RBC: 5 MIL/uL (ref 3.87–5.11)
RDW: 15.9 % — ABNORMAL HIGH (ref 11.5–15.5)
WBC: 16 10*3/uL — ABNORMAL HIGH (ref 4.0–10.5)

## 2017-09-12 LAB — BASIC METABOLIC PANEL
Anion gap: 10 (ref 5–15)
BUN: 6 mg/dL — ABNORMAL LOW (ref 8–23)
CHLORIDE: 108 mmol/L (ref 98–111)
CO2: 24 mmol/L (ref 22–32)
CREATININE: 0.59 mg/dL (ref 0.44–1.00)
Calcium: 8.5 mg/dL — ABNORMAL LOW (ref 8.9–10.3)
GFR calc non Af Amer: >60 mL/min (ref >60)
GLUCOSE: 83 mg/dL (ref 70–99)
Potassium: 4.1 mmol/L (ref 3.5–5.1)
Sodium: 142 mmol/L (ref 135–145)

## 2017-09-12 NOTE — Evaluation (Signed)
Clinical/Bedside Swallow Evaluation Patient Details  Name: [name] Frank MRN: [id number] Date of Birth: [birth year]  Today's Date: 09/12/2017 Time: SLP Start Time (ACUTE ONLY): 3474 SLP Stop Time (ACUTE ONLY): 1412 SLP Time Calculation (min) (ACUTE ONLY): 26 min  Past Medical History:  Past Medical History:  Diagnosis Date  . Allergy   . Anxiety   . Arthritis    degenerative oateoarthritis  . Decubital ulcer 11/14/2014  . Dementia   . GERD (gastroesophageal reflux disease)   . Hyperlipidemia   . Hypertension   . Neurogenic urinary incontinence    since back surgery  . Urinary frequency    Past Surgical History:  Past Surgical History:  Procedure Laterality Date  . ABDOMINAL HYSTERECTOMY    . APPENDECTOMY    . BACK SURGERY  2000  . BONE MARROW BIOPSY N/A 11/22/2014   Procedure: SACRAL BONE BIOPSY PRONE POSITION;  Surgeon: Georganna Skeans, MD;  Location: [place];  Service: General;  Laterality: N/A;  . [place]   biopsy  . CHOLECYSTECTOMY    . COLON SURGERY    . COLONOSCOPY W/ POLYPECTOMY    . ILEO LOOP COLOSTOMY CLOSURE N/A 11/22/2014   Procedure: LAPAROSCOPIC LOOP COLOSTOMY ;  Surgeon: Georganna Skeans, MD;  Location: [place];  Service: General;  Laterality: N/A;  . TOTAL HIP ARTHROPLASTY  01/07/2012   Procedure: TOTAL HIP ARTHROPLASTY;  Surgeon: Yvette Rack., MD;  Location: [place];  Service: Orthopedics;  Laterality: Right;   HPI:  Pt is an 83 y.o. female with medical history significant of advanced dementia, HTN, HLD, and chronic sacral wound, who was brought from her long-term SNF with sepsis likely due to cellulitis versus UTI. CXR on admission with acute airspace disease. BSE 05/15/2015 was [facility].   Assessment / Plan / Recommendation Clinical Impression  Pt has signs of a cognitively-based oral dysphagia, including reduced bolus awareness, which improves with SLP providing assistance for self-feeding. Mastication is also prolonged with mild oral residue. Given  extra time and Min cues, she clears her oral phase well. Her husband reports that she is currently on her baseline diet. Therefore, would continue with Dys 2 textures and thin liquids with full supervision during meals to assist with self-feeding and help initiate intake. No acute SLP needs identified. SLP Visit Diagnosis: Dysphagia, oral phase (R13.11)    Aspiration Risk  Mild aspiration risk;Moderate aspiration risk    Diet Recommendation Dysphagia 2 (Fine chop);Thin liquid   Liquid Administration via: Cup;Straw Medication Administration: Whole meds with puree(crush PRN) Supervision: Staff to assist with self feeding;Full supervision/cueing for compensatory strategies Compensations: Slow rate;Small sips/bites;Minimize environmental distractions;Lingual sweep for clearance of pocketing Postural Changes: Seated upright at 90 degrees    Other  Recommendations Oral Care Recommendations: Oral care BID   Follow up Recommendations Skilled Nursing facility      Frequency and Duration            Prognosis Prognosis for Safe Diet Advancement: (at baseline diet)      Swallow Study   General HPI: Pt is an 83 y.o. female with medical history significant of advanced dementia, HTN, HLD, and chronic sacral wound, who was brought from her long-term SNF with sepsis likely due to cellulitis versus UTI. CXR on admission with acute airspace disease. BSE 05/15/2015 was [facility]. Type of Study: Bedside Swallow Evaluation Previous Swallow Assessment: see HPI Diet Prior to this Study: Dysphagia 2 (chopped);Thin liquids Temperature Spikes Noted: No Respiratory Status: Room air History of Recent Intubation:  No Behavior/Cognition: Alert;Cooperative;Requires cueing Oral Care Completed by SLP: No Oral Cavity - Dentition: Poor condition Self-Feeding Abilities: Needs assist Patient Positioning: Upright in bed Baseline Vocal Quality: Low vocal intensity    Oral/Motor/Sensory Function     Ice Chips Ice chips:  Not tested   Thin Liquid Thin Liquid: Within functional limits Presentation: Self Fed;Straw    Nectar Thick Nectar Thick Liquid: Not tested   Honey Thick Honey Thick Liquid: Not tested   Puree Puree: Impaired Presentation: Self Fed;Spoon Oral Phase Impairments: Poor awareness of bolus Oral Phase Functional Implications: Oral holding   Solid   GO   Solid: Impaired Presentation: Self Fed Oral Phase Impairments: Impaired mastication Oral Phase Functional Implications: Impaired mastication;Prolonged oral transit;Right lateral sulci pocketing        Germain Osgood 09/12/2017,2:22 PM  Germain Osgood, M.A. CCC-SLP [phone]

## 2017-09-12 NOTE — Progress Notes (Signed)
Pt BP 167/93, Craige CottaKirby, NP paged.

## 2017-09-12 NOTE — Clinical Social Work Note (Signed)
Clinical Social Work Assessment  Patient Details  Name: [name] Frank MRN: [id number] Date of Birth: 04/08/1934  Date of referral:  09/12/17               Reason for consult:  Discharge Planning                Permission sought to share information with:  Facility Medical sales representativeContact Representative, Family Supports Permission granted to share information::  No  Name::     Comptrolleray  Agency::  SNFs  Relationship::  Spouse  Contact Information:  [phone]  Housing/Transportation Living arrangements for the past 2 months:  Skilled Nursing Facility Source of Information:  Spouse Patient Interpreter Needed:  None Criminal Activity/Legal Involvement Pertinent to Current Situation/Hospitalization:  No - Comment as needed Significant Relationships:  Spouse Lives with:  Facility Resident Do you feel safe going back to the place where you live?  Yes Need for family participation in patient care:  Yes (Comment)  Care giving concerns:  CSW received consult for possible SNF placement at time of discharge. CSW spoke with patient's spouse. [facility]. [name] states that he loves [facility] but thinks they do not have enough staff. He requests for patient to return there at discharge. CSW to continue to follow and assist with discharge planning needs.   Social Worker assessment / plan:  CSW spoke with patient's spouse concerning return to SNF.  Employment status:  Retired Health and safety inspectornsurance information:  [place]icaid [place], [employer] PT Recommendations:  Skilled Nursing Facility Information / Referral to community resources:  Skilled Nursing Facility  Patient/Family's Response to care:  [name] reports agreement with discharge back to [facility]. [place] from [place] but has family to help him as well. He stated he could not afford to pay a bed hold over there. CSW will communicate with [facility] as to bed status.   Patient/Family's  Understanding of and Emotional Response to Diagnosis, Current Treatment, and Prognosis:  Patient/family is realistic regarding therapy needs and expressed being hopeful for SNF placement. [name] expressed understanding of CSW role and discharge process as well as medical condition. Patient perked up at the end of conversation and was laughing with her husband. No questions/concerns about plan or treatment.    Emotional Assessment Appearance:  Appears stated age Attitude/Demeanor/Rapport:  Other(Quiet) Affect (typically observed):  Pleasant Orientation:  Oriented to Self Alcohol / Substance use:  Not Applicable Psych involvement (Current and /or in the community):  No (Comment)  Discharge Needs  Concerns to be addressed:  Care Coordination Readmission within the last 30 days:  No Current discharge risk:  None Barriers to Discharge:  Continued Medical Work up   [employer] S [name] Frank, LCSWA 09/12/2017, 4:47 PM

## 2017-09-12 NOTE — [place] FL2 (Signed)
[place] MEDICAID FL2 LEVEL OF CARE SCREENING TOOL     IDENTIFICATION  Patient Name: [name] Frank Birthdate: 1934/10/30 Sex: female Admission Date (Current Location): 09/10/2017  [facility] and [place] Number:  Producer, television/film/video and Address:  [facility], [address] [place], [place] [address]      Provider Number: [id number]  Attending Physician Name and Address:  Osvaldo Shipper, MD  Relative Name and Phone Number:  Channing Mutters, spouse, [phone]    Current Level of Care: Hospital Recommended Level of Care: Skilled Nursing Facility Prior Approval Number:    Date Approved/Denied:   PASRR Number: [id number]  Discharge Plan: SNF    Current Diagnoses: Patient Active Problem List   Diagnosis Date Noted  . Sepsis (HCC) 09/10/2017  . Chronic cough 03/23/2017  . Failure to thrive in adult 05/16/2015  . Sacral decubitus ulcer 05/14/2015  . Malnutrition of moderate degree 05/01/2015  . Wound infection 04/29/2015  . Colostomy in place [facility]) 01/06/2015  . Catheter-associated urinary tract infection (HCC) 11/26/2014  . Osteomyelitis due to staphylococcus aureus (HCC) 11/25/2014  . Bacteremia 11/25/2014  . Postprocedural fever 11/24/2014  . UTI (urinary tract infection) 11/24/2014  . Palliative care encounter 11/22/2014  . DNR (do not resuscitate) discussion 11/22/2014  . Osteomyelitis, pelvic region and thigh (HCC)   . Goals of care, counseling/discussion   . Enterococcus faecalis infection   . Protein-calorie malnutrition, severe (HCC) 11/15/2014  . Decubitus ulcer of sacral region, stage 4 (HCC) 11/14/2014  . Stage IV decubitus ulcer (HCC) 11/14/2014  . Cognitive changes 06/19/2014  . GERD (gastroesophageal reflux disease) 08/17/2013  . Postoperative anemia due to acute blood loss 01/08/2012  . Osteoarthritis 09/23/2010  . Lumbago 03/23/2010  . OVERACTIVE BLADDER 12/16/2009  . EDEMA 04/25/2009  . DEGENERATIVE JOINT DISEASE, HIPS  09/20/2008  . HYPERLIPIDEMIA 07/10/2008  . Essential hypertension 07/10/2008  . URINARY INCONTINENCE 07/10/2008    Orientation RESPIRATION BLADDER Height & Weight     Self  Normal Continent, Indwelling catheter Weight: 68.1 kg (150 lb 2.1 oz) Height:     BEHAVIORAL SYMPTOMS/MOOD NEUROLOGICAL BOWEL NUTRITION STATUS      Continent, Colostomy Diet(Please see DC Summary)  AMBULATORY STATUS COMMUNICATION OF NEEDS Skin   Extensive Assist Verbally Other (Comment)(Wound on sacrum)                       Personal Care Assistance Level of Assistance  Bathing, Feeding, Dressing Bathing Assistance: Maximum assistance Feeding assistance: Limited assistance Dressing Assistance: Limited assistance     Functional Limitations Info  Sight, Hearing, Speech Sight Info: Adequate Hearing Info: Adequate Speech Info: Adequate    SPECIAL CARE FACTORS FREQUENCY                       Contractures      Additional Factors Info  Code Status, Allergies, Isolation Precautions Code Status Info: DNR Allergies Info: Augmentin Amoxicillin-pot Clavulanate, Codeine Sulfate, Tramadol Hcl     Isolation Precautions Info: MRSA positive in 2016     Current Medications (09/12/2017):  This is the current hospital active medication list Current Facility-Administered Medications  Medication Dose Route Frequency Provider Last Rate Last Dose  . acetaminophen (TYLENOL) tablet 650 mg  650 mg Oral Q6H PRN Leatha Gilding, MD       Or  . acetaminophen (TYLENOL) suppository 650 mg  650 mg Rectal Q6H PRN Leatha Gilding, MD      .  amLODipine (NORVASC) tablet 5 mg  5 mg Oral Daily Osvaldo ShipperKrishnan, Gokul, MD   5 mg at 09/12/17 1019  . atorvastatin (LIPITOR) tablet 10 mg  10 mg Oral Daily Leatha GildingGherghe, Costin M, MD   10 mg at 09/12/17 1017  . cloNIDine (CATAPRES) tablet 0.1 mg  0.1 mg Oral BID Osvaldo ShipperKrishnan, Gokul, MD   0.1 mg at 09/12/17 1019  . donepezil (ARICEPT) tablet 10 mg  10 mg Oral QHS Leatha GildingGherghe, Costin M, MD   10 mg  at 09/11/17 2124  . enoxaparin (LOVENOX) injection 40 mg  40 mg Subcutaneous Q24H Leatha GildingGherghe, Costin M, MD   40 mg at 09/12/17 1013  . famotidine (PEPCID) tablet 20 mg  20 mg Oral Daily Leatha GildingGherghe, Costin M, MD   20 mg at 09/12/17 1018  . feeding supplement (PRO-STAT SUGAR FREE 64) liquid 30 mL  30 mL Oral TID WC Gherghe, Costin M, MD   30 mL at 09/11/17 1020  . fluticasone (FLONASE) 50 MCG/ACT nasal spray 2 spray  2 spray Each Nare Daily Leatha GildingGherghe, Costin M, MD   2 spray at 09/12/17 1012  . ipratropium-albuterol (DUONEB) 0.5-2.5 (3) MG/3ML nebulizer solution 3 mL  3 mL Nebulization Q4H PRN Leatha GildingGherghe, Costin M, MD      . lactose free nutrition (Boost) liquid 237 mL  1 Container Oral TID BM Leatha GildingGherghe, Costin M, MD   237 mL at 09/12/17 1011  . loratadine (CLARITIN) tablet 10 mg  10 mg Oral Daily Leatha GildingGherghe, Costin M, MD   10 mg at 09/12/17 1017  . losartan (COZAAR) tablet 50 mg  50 mg Oral Daily Osvaldo ShipperKrishnan, Gokul, MD   50 mg at 09/12/17 1018  . metoprolol succinate (TOPROL-XL) 24 hr tablet 25 mg  25 mg Oral Daily Leatha GildingGherghe, Costin M, MD   25 mg at 09/12/17 1018  . multivitamin with minerals tablet 1 tablet  1 tablet Oral Daily Leatha GildingGherghe, Costin M, MD   1 tablet at 09/12/17 1015  . ondansetron (ZOFRAN) tablet 4 mg  4 mg Oral Q6H PRN Leatha GildingGherghe, Costin M, MD       Or  . ondansetron (ZOFRAN) injection 4 mg  4 mg Intravenous Q6H PRN Leatha GildingGherghe, Costin M, MD      . pantoprazole (PROTONIX) EC tablet 40 mg  40 mg Oral Daily Leatha GildingGherghe, Costin M, MD   40 mg at 09/12/17 1016  . piperacillin-tazobactam (ZOSYN) IVPB 3.375 g  3.375 g Intravenous Q8H Leatha GildingGherghe, Costin M, MD 12.5 mL/hr at 09/12/17 0528 3.375 g at 09/12/17 0528  . vancomycin (VANCOCIN) 500 mg in sodium chloride 0.9 % 100 mL IVPB  500 mg Intravenous Q12H Leatha GildingGherghe, Costin M, MD 100 mL/hr at 09/12/17 0337 500 mg at 09/12/17 0337  . vitamin C (ASCORBIC ACID) tablet 500 mg  500 mg Oral BID Leatha GildingGherghe, Costin M, MD   500 mg at 09/12/17 1017  . Vitamin D (Ergocalciferol) (DRISDOL) capsule  50,000 Units  50,000 Units Oral Q7 days Leatha GildingGherghe, Costin M, MD   50,000 Units at 09/11/17 1020  . zinc sulfate capsule 220 mg  220 mg Oral BID Leatha GildingGherghe, Costin M, MD   220 mg at 09/12/17 1016     Discharge Medications: Please see discharge summary for a list of discharge medications.  Relevant Imaging Results:  Relevant Lab Results:   Additional Information SSN: [id number]  Renne CriglerNadia S ElsmoreRayyan, ConnecticutLCSWA

## 2017-09-12 NOTE — Progress Notes (Signed)
[facility] PROGRESS NOTE  [name] FlavinShirley M Ruz ZOX:[id number] DOB: [birth year] DOA: 09/10/2017  PCP: Kristian CoveyBurchette, Bruce W, MD  Brief History/Interval Summary: 83 year old American female with a past medical history of advanced dementia, essential hypertension, hyperlipidemia, chronic sacral wound, diabetic colostomy presented from skilled nursing facility due to decreased appetite nausea vomiting and fever.  There was concern for a urinary tract infection along with cellulitis in the right thigh area.  Her sacral wound was noted to be stable without active signs of infection.  Patient was hospitalized for further management.  Reason for Visit: Urinary tract infection.  Cellulitis involving right lower extremity in the thigh area  Consultants: None  Procedures: None  Antibiotics: Vancomycin and Zosyn  Subjective/Interval History: Patient remains pleasantly confused.  However she is a little bit more responsive today compared to yesterday.  She was able to answer a few questions.  ROS: Unable to do due to her dementia  Objective:  Vital Signs  Vitals:   09/11/17 1019 09/11/17 1425 09/11/17 2100 09/12/17 0532  BP: (!) 160/80 (!) 176/96 (!) 157/90 (!) 167/93  Pulse: 100 80 68 70  Resp:  18 16 18   Temp:  98.8 F (37.1 C) 99.2 F (37.3 C) 97.7 F (36.5 C)  TempSrc:   Oral   SpO2:  98% 98% 100%  Weight:        Intake/Output Summary (Last 24 hours) at 09/12/2017 0913 Last data filed at 09/12/2017 0528 Gross per 24 hour  Intake 147.69 ml  Output 300 ml  Net -152.31 ml   Filed Weights   09/10/17 1745 09/10/17 1852  Weight: 65.3 kg (144 lb) 68.1 kg (150 lb 2.1 oz)    General appearance: Awake alert.  Distracted.  No distress Resp: Noted to have normal effort at rest.  Diminished air entry at the bases.  No wheezing rales or rhonchi. Cardio: S1-S2 is normal regular.  No S3-S4.  No rubs murmurs or bruit GI: Abdomen is soft.  Nontender nondistended.  Bowel sounds are  present.  No masses organomegaly Extremities: Continues to have erythematous rash in the right lateral thigh.  Warm to touch.  No fluctuation noted. Neurologic: Remains disoriented.  No obvious focal neurological deficits.  Lab Results:  Data Reviewed: I have personally reviewed following labs and imaging studies  CBC: Recent Labs  Lab 09/10/17 1600 09/11/17 0700 09/12/17 0641  WBC 13.7* 16.4* 16.0*  NEUTROABS 11.2*  --   --   HGB 14.0 13.2 13.2  HCT 44.6 42.4 40.6  MCV 83.1 84.0 81.2  PLT 217 199 190    Basic Metabolic Panel: Recent Labs  Lab 09/10/17 1600 09/11/17 0700 09/12/17 0641  NA 142 146* 142  K 3.1* 3.5 4.1  CL 104 108 108  CO2 28 28 24   GLUCOSE 122* 93 83  BUN 8 8 6*  CREATININE 0.77 0.67 0.59  CALCIUM 8.3* 8.4* 8.5*    GFR: Estimated Creatinine Clearance: 50.5 mL/min (by C-G formula based on SCr of 0.59 mg/dL).  Liver Function Tests: Recent Labs  Lab 09/10/17 1600 09/11/17 0700  AST 22 20  ALT 13 12  ALKPHOS 76 70  BILITOT 0.5 0.7  PROT 6.4* 5.7*  ALBUMIN 2.9* 2.6*    Coagulation Profile: Recent Labs  Lab 09/10/17 1600  INR 1.13     Recent Results (from the past 240 hour(s))  Culture, blood (Routine x 2)     Status: None (Preliminary result)   Collection Time: 09/10/17  4:09 PM  Result Value  Ref Range Status   Specimen Description BLOOD RIGHT FOREARM  Final   Special Requests   Final    BOTTLES DRAWN AEROBIC AND ANAEROBIC Blood Culture results may not be optimal due to an excessive volume of blood received in culture bottles   Culture   Final    NO GROWTH < 24 HOURS Performed at [facility], [address], [place], [place] [address]    Report Status PENDING  Incomplete  Culture, blood (Routine x 2)     Status: None (Preliminary result)   Collection Time: 09/10/17  4:33 PM  Result Value Ref Range Status   Specimen Description BLOOD RIGHT HAND  Final   Special Requests   Final    BOTTLES DRAWN AEROBIC AND ANAEROBIC Blood  Culture adequate volume   Culture   Final    NO GROWTH < 24 HOURS Performed at [facility], [address] [place], [place] [address]    Report Status PENDING  Incomplete  Urine culture     Status: Abnormal   Collection Time: 09/10/17  5:07 PM  Result Value Ref Range Status   Specimen Description URINE, CATHETERIZED  Final   Special Requests   Final    NONE Performed at [facility], [address] [place], [place] [address]    Culture MULTIPLE SPECIES PRESENT, SUGGEST RECOLLECTION (A)  Final   Report Status 09/11/2017 FINAL  Final  MRSA PCR Screening     Status: None   Collection Time: 09/10/17  7:02 PM  Result Value Ref Range Status   MRSA by PCR NEGATIVE NEGATIVE Final    Comment:        The GeneXpert MRSA Assay (FDA approved for NASAL specimens only), is one component of a comprehensive MRSA colonization surveillance program. It is not intended to diagnose MRSA infection nor to guide or monitor treatment for MRSA infections. Performed at [facility], [address] [place], [place] [address]       Radiology Studies: Dg Chest Portable 1 View  Result Date: 09/10/2017 CLINICAL DATA:  Sepsis, fever EXAM: PORTABLE CHEST 1 VIEW COMPARISON:  03/23/2017 chest radiograph. FINDINGS: Stable cardiomediastinal silhouette with normal heart size. No pneumothorax. No pleural effusion. No pulmonary edema. Mild left basilar scarring versus atelectasis. No acute consolidative airspace disease. IMPRESSION: Mild left basilar scarring versus atelectasis. Otherwise no active disease. Electronically Signed   By: Delbert Phenix M.D.   On: 09/10/2017 16:59     Medications:  Scheduled: . amLODipine  5 mg Oral Daily  . atorvastatin  10 mg Oral Daily  . cloNIDine  0.1 mg Oral BID  . donepezil  10 mg Oral QHS  . enoxaparin (LOVENOX) injection  40 mg Subcutaneous Q24H  . famotidine  20 mg Oral Daily  . feeding supplement (PRO-STAT SUGAR FREE 64)  30 mL Oral TID WC  .  fluticasone  2 spray Each Nare Daily  . lactose free nutrition  1 Container Oral TID BM  . loratadine  10 mg Oral Daily  . losartan  50 mg Oral Daily  . metoprolol succinate  25 mg Oral Daily  . multivitamin with minerals  1 tablet Oral Daily  . pantoprazole  40 mg Oral Daily  . ascorbic acid  500 mg Oral BID  . Vitamin D (Ergocalciferol)  50,000 Units Oral Q7 days  . zinc sulfate  220 mg Oral BID   Continuous: . piperacillin-tazobactam (ZOSYN)  IV 3.375 g (09/12/17 0528)  .  vancomycin 500 mg (09/12/17 1610)   RUE:[id number] **OR** acetaminophen, ipratropium-albuterol, ondansetron **OR** ondansetron (ZOFRAN) IV  Assessment/Plan:    Sepsis likely due to cellulitis versus UTI Sepsis physiology appears to have improved.  Charted on vancomycin and Zosyn.  Cultures are negative so far.  Repeat urine culture.  MRSA PCR is negative.  Discontinue vancomycin after today.  Erythematous area in the right thigh is stable.   Right lower extremity cellulitis Patient has area of erythema in the right lateral thigh.  Appears to be stable for the most part.  Continue to monitor.  Antibiotics as above.  Abnormal UA possible UTI She has a chronic Foley.  She could be colonized.  Foley catheter was changed on the day of admission.  Urine cultures did not show any specific organism.  We will repeat.  Continue antibiotics for now.  Stage IV sacral decubitus Wound was examined in the emergency department.  Does not appear to be actively infected.  Patient has a diverting colostomy.  Wound care nurses following..  Advanced dementia/oropharyngeal dysphagia Seems to be at her baseline.  Continue home medications.  Speech therapy to see.  Currently on dysphagia 2 diet.  Severe protein calorie malnutrition Encourage oral intake.  Nutritional supplements.  Essential hypertension Her antihypertensive medications were held due to sepsis.  These were resumed on 6/30.  Patient is noted to be on amlodipine  clonidine losartan and metoprolol.    Nausea and vomiting Appears to have resolved.  Continue to watch.  Hypokalemia Repleted.  DVT Prophylaxis: Lovenox    Code Status: DNR Family Communication: No family at bedside Disposition Plan: Management as outlined above.  She is here from a skilled nursing facility and anticipate discharge back to skilled nursing facility when medically improved.    LOS: 2 days   Osvaldo Shipper  [facility] Pager [phone] 09/12/2017, 9:13 AM  If 7PM-7AM, please contact night-coverage at [email or web address], password [email or web address]

## 2017-09-12 NOTE — Progress Notes (Signed)
Initial Nutrition Assessment  DOCUMENTATION CODES:   Severe malnutrition in context of chronic illness  INTERVENTION:  Continue Pro-stat 30mL TID, each supplement provides 100 calories and 15 grams of protein  Continue Boost Plus TID, each supplement provides 360 calories and 14 grams of protein  MVI, Zinc, Vitamin C  NUTRITION DIAGNOSIS:   Severe Malnutrition related to chronic illness(advanced dementia) as evidenced by severe fat depletion, severe muscle depletion.  GOAL:   Patient will meet greater than or equal to 90% of their needs  MONITOR:   Supplement acceptance, PO intake  REASON FOR ASSESSMENT:   Consult Assessment of nutrition requirement/status  ASSESSMENT:   Patient with PMH advanced dementia, HTN, HLD, chronic sacral wound, colostomy, presents with decreased appetite, nausea, vomiting, and fever, possible UTI, sepsis, cellulitis.   Attempted to speak with patient at bedside but she was unable to provide any history. Per chart she exhibits weight gain from previous admission. Meal Completion: 50-85%   Labs reviewed:   Medications reviewed and include:  Vitmain D, Vitamin C, Zinc  NUTRITION - FOCUSED PHYSICAL EXAM:    Most Recent Value  Orbital Region  Moderate depletion  Upper Arm Region  Severe depletion  Thoracic and Lumbar Region  Severe depletion  Buccal Region  Severe depletion  Temple Region  Severe depletion  Clavicle Bone Region  Moderate depletion  Clavicle and Acromion Bone Region  Moderate depletion  Scapular Bone Region  Unable to assess  Dorsal Hand  Unable to assess  Patellar Region  Moderate depletion  Anterior Thigh Region  Moderate depletion  Posterior Calf Region  Severe depletion       Diet Order:   Diet Order           DIET DYS 2 Room service appropriate? Yes; Fluid consistency: Thin  Diet effective now          EDUCATION NEEDS:   Not appropriate for education at this time  Skin:  Skin Assessment: Skin  Integrity Issues: Skin Integrity Issues:: Stage IV Stage IV: To sacrum  Last BM:  7/1 (Colostomy)  Height:   Ht Readings from Last 1 Encounters:  03/23/17 5\' 5"  (1.651 m)    Weight:   Wt Readings from Last 1 Encounters:  09/10/17 150 lb 2.1 oz (68.1 kg)    Ideal Body Weight:  56.81 kg  BMI:  Body mass index is 24.98 kg/m.  Estimated Nutritional Needs:   Kcal:  2000-2150 calories (30-32 cal/kg)  Protein:  103-136 grams (1.5-2g/kg)  Fluid:  >2L    [name] AnoWilliam M. Kordelia Severin, MS, RD LDN Inpatient Clinical Dietitian Pager [phone]

## 2017-09-13 DIAGNOSIS — L03115 Cellulitis of right lower limb: Secondary | ICD-10-CM | POA: Diagnosis not present

## 2017-09-13 DIAGNOSIS — N390 Urinary tract infection, site not specified: Secondary | ICD-10-CM | POA: Diagnosis not present

## 2017-09-13 DIAGNOSIS — E43 Unspecified severe protein-calorie malnutrition: Secondary | ICD-10-CM | POA: Diagnosis not present

## 2017-09-13 DIAGNOSIS — I10 Essential (primary) hypertension: Secondary | ICD-10-CM | POA: Diagnosis not present

## 2017-09-13 NOTE — Progress Notes (Signed)
[facility] PROGRESS NOTE  [name] Frank ZOX:[id number] DOB: 1934/12/29 DOA: 09/10/2017  PCP: Kristian Covey, MD  Brief History/Interval Summary: 83 year old American female with a past medical history of advanced dementia, essential hypertension, hyperlipidemia, chronic sacral wound, diabetic colostomy presented from skilled nursing facility due to decreased appetite nausea vomiting and fever.  There was concern for a urinary tract infection along with cellulitis in the right thigh area.  Her sacral wound was noted to be stable without active signs of infection.  Patient was hospitalized for further management.  Reason for Visit: Urinary tract infection.  Cellulitis involving right lower extremity in the thigh area  Consultants: None  Procedures: None  Antibiotics: Vancomycin and Zosyn  Subjective/Interval History: Patient remains pleasantly confused.   Seems to be more responsive compared to 2 days ago.  Unable to provide much information.  However she denies any pain.   ROS: Unable to do due to her dementia  Objective:  Vital Signs  Vitals:   09/12/17 1017 09/12/17 1419 09/12/17 2126 09/13/17 0459  BP: (!) 166/85 (!) 183/96 (!) 133/101 (!) 160/90  Pulse: 80 71 95 66  Resp:  19 18 18   Temp:  (!) 97.4 F (36.3 C) 98 F (36.7 C) 97.8 F (36.6 C)  TempSrc:  Oral Oral   SpO2:  100% 100% 100%  Weight:        Intake/Output Summary (Last 24 hours) at 09/13/2017 0927 Last data filed at 09/13/2017 0904 Gross per 24 hour  Intake 1380.05 ml  Output 400 ml  Net 980.05 ml   Filed Weights   09/10/17 1745 09/10/17 1852  Weight: 65.3 kg (144 lb) 68.1 kg (150 lb 2.1 oz)    General appearance: Awake alert.  Distracted.  In no distress Resp: Diminished air entry at the bases.  No wheezing rales or rhonchi.  Normal effort. Cardio: S1-S2 is normal regular.  No S3-S4.  No rubs murmurs or bruit GI: Abdomen soft.  Nontender nondistended.  Bowel sounds are present.  No  masses organomegaly.  Colostomy is present with brown stool. Extremities: Continues to have erythema over the right upper thigh.  This extends all the way up into the hip area.  There has been no worsening nor any improvement.  She has somewhat restricted movement of the right hip.  Previous hip replacement.  No obvious fluctuation noted. Back: Stage IV sacral decubitus noted.  No exudate.  No active drainage present. Neurologic: Remains disoriented.  No obvious focal neurological deficits.  Lab Results:  Data Reviewed: I have personally reviewed following labs and imaging studies  CBC: Recent Labs  Lab 09/10/17 1600 09/11/17 0700 09/12/17 0641  WBC 13.7* 16.4* 16.0*  NEUTROABS 11.2*  --   --   HGB 14.0 13.2 13.2  HCT 44.6 42.4 40.6  MCV 83.1 84.0 81.2  PLT 217 199 190    Basic Metabolic Panel: Recent Labs  Lab 09/10/17 1600 09/11/17 0700 09/12/17 0641  NA 142 146* 142  K 3.1* 3.5 4.1  CL 104 108 108  CO2 28 28 24   GLUCOSE 122* 93 83  BUN 8 8 6*  CREATININE 0.77 0.67 0.59  CALCIUM 8.3* 8.4* 8.5*    GFR: Estimated Creatinine Clearance: 50.5 mL/min (by C-G formula based on SCr of 0.59 mg/dL).  Liver Function Tests: Recent Labs  Lab 09/10/17 1600 09/11/17 0700  AST 22 20  ALT 13 12  ALKPHOS 76 70  BILITOT 0.5 0.7  PROT 6.4* 5.7*  ALBUMIN 2.9* 2.6*  Coagulation Profile: Recent Labs  Lab 09/10/17 1600  INR 1.13     Recent Results (from the past 240 hour(s))  Culture, blood (Routine x 2)     Status: None (Preliminary result)   Collection Time: 09/10/17  4:09 PM  Result Value Ref Range Status   Specimen Description BLOOD RIGHT FOREARM  Final   Special Requests   Final    BOTTLES DRAWN AEROBIC AND ANAEROBIC Blood Culture results may not be optimal due to an excessive volume of blood received in culture bottles   Culture   Final    NO GROWTH 2 DAYS Performed at [facility], [address], [place], [place] [address]    Report Status PENDING   Incomplete  Culture, blood (Routine x 2)     Status: None (Preliminary result)   Collection Time: 09/10/17  4:33 PM  Result Value Ref Range Status   Specimen Description BLOOD RIGHT HAND  Final   Special Requests   Final    BOTTLES DRAWN AEROBIC AND ANAEROBIC Blood Culture adequate volume   Culture   Final    NO GROWTH 2 DAYS Performed at [facility], [address], [place], [place] [address]    Report Status PENDING  Incomplete  Urine culture     Status: Abnormal   Collection Time: 09/10/17  5:07 PM  Result Value Ref Range Status   Specimen Description URINE, CATHETERIZED  Final   Special Requests   Final    NONE Performed at [facility], [address], [place], [place] [address]    Culture MULTIPLE SPECIES PRESENT, SUGGEST RECOLLECTION (A)  Final   Report Status 09/11/2017 FINAL  Final  MRSA PCR Screening     Status: None   Collection Time: 09/10/17  7:02 PM  Result Value Ref Range Status   MRSA by PCR NEGATIVE NEGATIVE Final    Comment:        The GeneXpert MRSA Assay (FDA approved for NASAL specimens only), is one component of a comprehensive MRSA colonization surveillance program. It is not intended to diagnose MRSA infection nor to guide or monitor treatment for MRSA infections. Performed at [facility], [address], [place], [place] [address]       Radiology Studies: No results found.   Medications:  Scheduled: . amLODipine  5 mg Oral Daily  . atorvastatin  10 mg Oral Daily  . cloNIDine  0.1 mg Oral BID  . donepezil  10 mg Oral QHS  . enoxaparin (LOVENOX) injection  40 mg Subcutaneous Q24H  . famotidine  20 mg Oral Daily  . feeding supplement (PRO-STAT SUGAR FREE 64)  30 mL Oral TID WC  . fluticasone  2 spray Each Nare Daily  . lactose free nutrition  1 Container Oral TID BM  . loratadine  10 mg Oral Daily  . losartan  50 mg Oral Daily  . metoprolol succinate  25 mg Oral Daily  . multivitamin with minerals  1 tablet Oral  Daily  . pantoprazole  40 mg Oral Daily  . ascorbic acid  500 mg Oral BID  . Vitamin D (Ergocalciferol)  50,000 Units Oral Q7 days  . zinc sulfate  220 mg Oral BID   Continuous: . piperacillin-tazobactam (ZOSYN)  IV 3.375 g (09/13/17 2956)   OZH:[id number] **OR** acetaminophen, ipratropium-albuterol, ondansetron **OR** ondansetron (ZOFRAN) IV  Assessment/Plan:    Sepsis likely due to cellulitis versus UTI Sepsis physiology appears to have improved.  She  was started on vancomycin and Zosyn.  MRSA PCR is negative.  Cultures are negative so far.  Okay to discontinue vancomycin.  Continue just Zosyn for now.     Right lower extremity cellulitis Cellulitis has not shown any improvement.  It is not worse either.  It is overlying the right hip joint.  Patient has had previous hip replacement surgery in that joint.  Joint involvement is a possibility.  We will proceed with MRI of the hip.  Continue with Zosyn for now.    Abnormal UA possible UTI She has a chronic Foley.  She could be colonized.  Foley catheter was changed on the day of admission.  Urine cultures did not show any specific organism.   Stage IV sacral decubitus Continue wound care.  No evidence for active infection present at this time.  Patient has a diverting colostomy  Advanced dementia/oropharyngeal dysphagia Seems to be at her baseline.  Continue home medications.  Currently on dysphagia 2 diet.  Severe protein calorie malnutrition Encourage oral intake.  Nutritional supplements.  Essential hypertension Her antihypertensive medications were held due to sepsis.  These were resumed on 6/30.  Patient is noted to be on amlodipine clonidine losartan and metoprolol.  Blood pressure does tend to run high occasionally.  Continue to monitor.  May need to adjust doses.  Nausea and vomiting Appears to have resolved.  Continue to watch.  Hypokalemia Repleted.  DVT Prophylaxis: Lovenox    Code Status: DNR Family  Communication: No family at bedside Disposition Plan: Management as outlined above.  She is here from a skilled nursing facility and anticipate discharge back to skilled nursing facility when medically improved.  MRI right hip ordered.    LOS: 3 days   Osvaldo ShipperGokul Lynann Demetrius  [facility] Pager [phone] 09/13/2017, 9:27 AM  If 7PM-7AM, please contact night-coverage at [email or web address], password [email or web address]

## 2017-09-14 ENCOUNTER — Encounter (HOSPITAL_COMMUNITY): Payer: Self-pay | Admitting: General Practice

## 2017-09-14 ENCOUNTER — Inpatient Hospital Stay (HOSPITAL_COMMUNITY): Payer: Medicare Other

## 2017-09-14 DIAGNOSIS — I10 Essential (primary) hypertension: Secondary | ICD-10-CM

## 2017-09-14 DIAGNOSIS — A419 Sepsis, unspecified organism: Secondary | ICD-10-CM | POA: Diagnosis not present

## 2017-09-14 DIAGNOSIS — Z933 Colostomy status: Secondary | ICD-10-CM | POA: Diagnosis not present

## 2017-09-14 DIAGNOSIS — E43 Unspecified severe protein-calorie malnutrition: Secondary | ICD-10-CM

## 2017-09-14 DIAGNOSIS — L03115 Cellulitis of right lower limb: Secondary | ICD-10-CM | POA: Diagnosis not present

## 2017-09-14 LAB — CBC
HCT: 43 % (ref 36.0–46.0)
Hemoglobin: 13.6 g/dL (ref 12.0–15.0)
MCH: 25.9 pg — AB (ref 26.0–34.0)
MCHC: 31.6 g/dL (ref 30.0–36.0)
MCV: 81.9 fL (ref 78.0–100.0)
PLATELETS: 199 10*3/uL (ref 150–400)
RBC: 5.25 MIL/uL — AB (ref 3.87–5.11)
RDW: 15.7 % — ABNORMAL HIGH (ref 11.5–15.5)
WBC: 9 10*3/uL (ref 4.0–10.5)

## 2017-09-14 LAB — BASIC METABOLIC PANEL
Anion gap: 7 (ref 5–15)
CO2: 28 mmol/L (ref 22–32)
CREATININE: 0.64 mg/dL (ref 0.44–1.00)
Calcium: 8.2 mg/dL — ABNORMAL LOW (ref 8.9–10.3)
Chloride: 106 mmol/L (ref 98–111)
Glucose, Bld: 96 mg/dL (ref 70–99)
POTASSIUM: 3.3 mmol/L — AB (ref 3.5–5.1)
SODIUM: 141 mmol/L (ref 135–145)

## 2017-09-14 MED ORDER — GADOBENATE DIMEGLUMINE 529 MG/ML IV SOLN
15.0000 mL | Freq: Once | INTRAVENOUS | Status: AC
  Administered 2017-09-14: 15 mL via INTRAVENOUS

## 2017-09-14 MED ORDER — ENSURE ENLIVE PO LIQD
237.0000 mL | Freq: Two times a day (BID) | ORAL | Status: DC
  Administered 2017-09-14 – 2017-09-15 (×2): 237 mL via ORAL

## 2017-09-14 NOTE — Care Management Important Message (Signed)
Important Message  Patient Details  Name: [name] FlavinShirley M Woehler MRN: [id number] Date of Birth: February 01, 1935   Medicare Important Message Given:  Yes    Dorena BodoIris Duell Holdren 09/14/2017, 9:57 AM

## 2017-09-14 NOTE — Progress Notes (Signed)
[facility]                                                                              Patient Demographics  [name] Frank, is a 83 y.o. female, DOB - [birth year], XBJ:[id number]  Admit date - 09/10/2017   Admitting Physician Costin Otelia Sergeant, MD  Outpatient Primary MD for the patient is Kristian Covey, MD  Outpatient specialists:   LOS - 4  days   Medical records reviewed and are as summarized below:    Chief Complaint  Patient presents with  . Fever       Brief summary   83 year old American female with a past medical history of advanced dementia, essential hypertension, hyperlipidemia, chronic sacral wound, diabetic colostomy presented from skilled nursing facility due to decreased appetite nausea vomiting and fever.  There was concern for a urinary tract infection along with cellulitis in the right thigh area.  Her sacral wound was noted to be stable without active signs of infection.  Patient was hospitalized for further management.     Assessment & Plan    Principal problem  sepsis likely due to cellulitis versus UTI -Patient presented with sepsis, 100.7, tachycardia heart rate 100, BP 108/73 at the time of admission -Sepsis physiology appears to have resolved.  -Patient was started on IV vancomycin and Zosyn.  MRSA PCR negative, hence deescalated to IV Zosyn  Active problems  Right lower extremity cellulitis -Overlying the right hip joint, follow MRI of the right hip. -If no septic arthritis, will transition to oral antibiotics for disposition -For now continue with IV Zosyn  UTI versus colonization in the setting of chronic indwelling Foley - For now, continue IV Zosyn - Urine culture showed no growth  Stage IV sacral decubitus, present on admission -Continue wound care, has diverting colostomy  Advanced dementia/oropharyngeal dysphagia -Currently at baseline, continue home medications -  continue dysphagia 2 diet   Severe  protein calorie malnutrition -Continue nutritional supplements, dietitian consulted  Essential hypertension -Initially held due to sepsis, resumed on 6/30 -BP currently stable, continue amlodipine, losartan, metoprolol, clonidine   Code Status: DNR DVT Prophylaxis:  Lovenox  Family Communication: Discussed in detail with the patient, all imaging results, lab results explained to the patient    Disposition Plan: Awaiting MRI of the right hip, if no septic arthritis or abscess, possible DC in a.m. to skilled nursing facility on oral antibiotics  Time Spent in minutes 35 minutes  Procedures:  None  Consultants:     Antimicrobials:      Medications  Scheduled Meds: . amLODipine  5 mg Oral Daily  . atorvastatin  10 mg Oral Daily  . cloNIDine  0.1 mg Oral BID  . donepezil  10 mg Oral QHS  . enoxaparin (LOVENOX) injection  40 mg Subcutaneous Q24H  . famotidine  20 mg Oral Daily  . feeding supplement (PRO-STAT SUGAR FREE 64)  30 mL Oral TID WC  . fluticasone  2 spray Each Nare Daily  . lactose free nutrition  1 Container Oral TID BM  . loratadine  10 mg Oral Daily  . losartan  50 mg Oral Daily  .  metoprolol succinate  25 mg Oral Daily  . multivitamin with minerals  1 tablet Oral Daily  . pantoprazole  40 mg Oral Daily  . ascorbic acid  500 mg Oral BID  . Vitamin D (Ergocalciferol)  50,000 Units Oral Q7 days  . zinc sulfate  220 mg Oral BID   Continuous Infusions: . piperacillin-tazobactam (ZOSYN)  IV 3.375 g (09/14/17 0510)   PRN Meds:.acetaminophen **OR** acetaminophen, ipratropium-albuterol, ondansetron **OR** ondansetron (ZOFRAN) IV   Antibiotics   Anti-infectives (From admission, onward)   Start     Dose/Rate Route Frequency Ordered Stop   09/11/17 0400  vancomycin (VANCOCIN) 500 mg in sodium chloride 0.9 % 100 mL IVPB  Status:  Discontinued     500 mg 100 mL/hr over 60 Minutes Intravenous Every 12 hours 09/10/17 1803 09/13/17 0832   09/10/17 2200   piperacillin-tazobactam (ZOSYN) IVPB 3.375 g     3.375 g 12.5 mL/hr over 240 Minutes Intravenous Every 8 hours 09/10/17 1803     09/10/17 1800  piperacillin-tazobactam (ZOSYN) IVPB 3.375 g  Status:  Discontinued     3.375 g 100 mL/hr over 30 Minutes Intravenous  Once 09/10/17 1745 09/10/17 1751   09/10/17 1800  vancomycin (VANCOCIN) IVPB 1000 mg/200 mL premix  Status:  Discontinued     1,000 mg 200 mL/hr over 60 Minutes Intravenous  Once 09/10/17 1745 09/10/17 1751   09/10/17 1600  vancomycin (VANCOCIN) IVPB 1000 mg/200 mL premix     1,000 mg 200 mL/hr over 60 Minutes Intravenous  Once 09/10/17 1559 09/10/17 1737   09/10/17 1600  piperacillin-tazobactam (ZOSYN) IVPB 3.375 g     3.375 g 100 mL/hr over 30 Minutes Intravenous  Once 09/10/17 1559 09/10/17 1703        Subjective:   [name] Frank was seen and examined today.  No significant complaints, no fevers or chills.  Appears to be at baseline with dementia, pleasantly confused.  No obvious pain.  No acute events overnight.  Objective:   Vitals:   09/13/17 0459 09/13/17 1311 09/13/17 2144 09/14/17 0443  BP: (!) 160/90 139/90 118/76 132/79  Pulse: 66 70 75 (!) 56  Resp: 18 19 18 18   Temp: 97.8 F (36.6 C) 97.9 F (36.6 C) 97.7 F (36.5 C) 97.8 F (36.6 C)  TempSrc:  Axillary    SpO2: 100% 100% 100% 100%  Weight:        Intake/Output Summary (Last 24 hours) at 09/14/2017 1148 Last data filed at 09/14/2017 0941 Gross per 24 hour  Intake 855.52 ml  Output 1200 ml  Net -344.48 ml     Wt Readings from Last 3 Encounters:  09/10/17 68.1 kg (150 lb 2.1 oz)  03/23/17 65.3 kg (144 lb)  05/14/15 62.4 kg (137 lb 9.1 oz)     Exam  General: Awake, alert, somewhat confused  Eyes: PERRLA, EOMI, Anicteric Sclera,  HEENT:  Atraumatic, normocephalic  Cardiovascular: S1 S2 auscultated, no rubs, murmurs or gallops. Regular rate and rhythm.  Respiratory: Clear to auscultation bilaterally, no wheezing, rales or  rhonchi  Gastrointestinal: Soft, nontender, nondistended, + bowel sounds, colostomy  Ext: erythema over the right upper thigh, no fluctuation  Neuro: no obvious FND's  Musculoskeletal: No digital cyanosis, clubbing  Skin: No rashes  Psych: dementia   Data Reviewed:  I have personally reviewed following labs and imaging studies  Micro Results Recent Results (from the past 240 hour(s))  Culture, blood (Routine x 2)     Status: None (Preliminary result)   Collection  Time: 09/10/17  4:09 PM  Result Value Ref Range Status   Specimen Description BLOOD RIGHT FOREARM  Final   Special Requests   Final    BOTTLES DRAWN AEROBIC AND ANAEROBIC Blood Culture results may not be optimal due to an excessive volume of blood received in culture bottles   Culture   Final    NO GROWTH 4 DAYS Performed at [facility], [address] [place], [place] [address]    Report Status PENDING  Incomplete  Culture, blood (Routine x 2)     Status: None (Preliminary result)   Collection Time: 09/10/17  4:33 PM  Result Value Ref Range Status   Specimen Description BLOOD RIGHT HAND  Final   Special Requests   Final    BOTTLES DRAWN AEROBIC AND ANAEROBIC Blood Culture adequate volume   Culture   Final    NO GROWTH 4 DAYS Performed at [facility], [address] [place], [place] [address]    Report Status PENDING  Incomplete  Urine culture     Status: Abnormal   Collection Time: 09/10/17  5:07 PM  Result Value Ref Range Status   Specimen Description URINE, CATHETERIZED  Final   Special Requests   Final    NONE Performed at [facility], [address] [place], [place] [address]    Culture MULTIPLE SPECIES PRESENT, SUGGEST RECOLLECTION (A)  Final   Report Status 09/11/2017 FINAL  Final  MRSA PCR Screening     Status: None   Collection Time: 09/10/17  7:02 PM  Result Value Ref Range Status   MRSA by PCR NEGATIVE NEGATIVE Final    Comment:        The GeneXpert MRSA Assay  (FDA approved for NASAL specimens only), is one component of a comprehensive MRSA colonization surveillance program. It is not intended to diagnose MRSA infection nor to guide or monitor treatment for MRSA infections. Performed at [facility], [address] [place], [place] [address]     Radiology Reports Dg Chest Portable 1 View  Result Date: 09/10/2017 CLINICAL DATA:  Sepsis, fever EXAM: PORTABLE CHEST 1 VIEW COMPARISON:  03/23/2017 chest radiograph. FINDINGS: Stable cardiomediastinal silhouette with normal heart size. No pneumothorax. No pleural effusion. No pulmonary edema. Mild left basilar scarring versus atelectasis. No acute consolidative airspace disease. IMPRESSION: Mild left basilar scarring versus atelectasis. Otherwise no active disease. Electronically Signed   By: Delbert PhenixJason A Poff M.D.   On: 09/10/2017 16:59    Lab Data:  CBC: Recent Labs  Lab 09/10/17 1600 09/11/17 0700 09/12/17 0641 09/14/17 0640  WBC 13.7* 16.4* 16.0* 9.0  NEUTROABS 11.2*  --   --   --   HGB 14.0 13.2 13.2 13.6  HCT 44.6 42.4 40.6 43.0  MCV 83.1 84.0 81.2 81.9  PLT 217 199 190 199   Basic Metabolic Panel: Recent Labs  Lab 09/10/17 1600 09/11/17 0700 09/12/17 0641 09/14/17 0640  NA 142 146* 142 141  K 3.1* 3.5 4.1 3.3*  CL 104 108 108 106  CO2 28 28 24 28   GLUCOSE 122* 93 83 96  BUN 8 8 6* <5*  CREATININE 0.77 0.67 0.59 0.64  CALCIUM 8.3* 8.4* 8.5* 8.2*   GFR: Estimated Creatinine Clearance: 50.5 mL/min (by C-G formula based on SCr of 0.64 mg/dL). Liver Function Tests: Recent Labs  Lab 09/10/17 1600 09/11/17 0700  AST 22 20  ALT 13 12  ALKPHOS 76 70  BILITOT 0.5 0.7  PROT  6.4* 5.7*  ALBUMIN 2.9* 2.6*   No results for input(s): LIPASE, AMYLASE in the last 168 hours. No results for input(s): AMMONIA in the last 168 hours. Coagulation Profile: Recent Labs  Lab 09/10/17 1600  INR 1.13   Cardiac Enzymes: No results for input(s): CKTOTAL, CKMB, CKMBINDEX,  TROPONINI in the last 168 hours. BNP (last 3 results) No results for input(s): PROBNP in the last 8760 hours. HbA1C: No results for input(s): HGBA1C in the last 72 hours. CBG: No results for input(s): GLUCAP in the last 168 hours. Lipid Profile: No results for input(s): CHOL, HDL, LDLCALC, TRIG, CHOLHDL, LDLDIRECT in the last 72 hours. Thyroid Function Tests: No results for input(s): TSH, T4TOTAL, FREET4, T3FREE, THYROIDAB in the last 72 hours. Anemia Panel: No results for input(s): VITAMINB12, FOLATE, FERRITIN, TIBC, IRON, RETICCTPCT in the last 72 hours. Urine analysis:    Component Value Date/Time   COLORURINE YELLOW 09/10/2017 1741   APPEARANCEUR CLOUDY (A) 09/10/2017 1741   LABSPEC 1.038 (H) 09/10/2017 1741   PHURINE 5.0 09/10/2017 1741   GLUCOSEU NEGATIVE 09/10/2017 1741   HGBUR SMALL (A) 09/10/2017 1741   HGBUR negative 03/23/2010 0921   BILIRUBINUR NEGATIVE 09/10/2017 1741   BILIRUBINUR neg 10/10/2014 1423   KETONESUR 5 (A) 09/10/2017 1741   PROTEINUR 100 (A) 09/10/2017 1741   UROBILINOGEN 0.2 11/24/2014 1127   NITRITE NEGATIVE 09/10/2017 1741   LEUKOCYTESUR MODERATE (A) 09/10/2017 1741     Nat Lowenthal M.D. [facility] 09/14/2017, 11:48 AM  Pager: [phone] Between 7am to 7pm - call Pager - [phone]  After 7pm go to [email or web address] - password [email or web address]  Call night coverage person covering after 7pm

## 2017-09-14 NOTE — Progress Notes (Signed)
CSW updated [name] Frank's that patient is not medically ready for discharge yet.   [name] LCSW [phone]

## 2017-09-14 NOTE — Progress Notes (Signed)
PT Cancellation Note  Patient Details Name: [name] FlavinShirley M Frank MRN: [id number] DOB: 1934/06/20   Cancelled Treatment:    Reason Eval/Treat Not Completed: Patient at procedure or test/unavailable Pt off unit for MRI. PT will continue to follow acutely.    Derek MoundKellyn R Tytionna Cloyd Blessen Kimbrough, PTA Pager: [phone]   09/14/2017, 2:01 PM

## 2017-09-15 DIAGNOSIS — A419 Sepsis, unspecified organism: Secondary | ICD-10-CM | POA: Diagnosis not present

## 2017-09-15 DIAGNOSIS — I10 Essential (primary) hypertension: Secondary | ICD-10-CM | POA: Diagnosis not present

## 2017-09-15 DIAGNOSIS — Z933 Colostomy status: Secondary | ICD-10-CM | POA: Diagnosis not present

## 2017-09-15 DIAGNOSIS — E43 Unspecified severe protein-calorie malnutrition: Secondary | ICD-10-CM | POA: Diagnosis not present

## 2017-09-15 DIAGNOSIS — L03115 Cellulitis of right lower limb: Secondary | ICD-10-CM | POA: Diagnosis not present

## 2017-09-15 LAB — CBC
HEMATOCRIT: 41.2 % (ref 36.0–46.0)
HEMOGLOBIN: 12.9 g/dL (ref 12.0–15.0)
MCH: 26 pg (ref 26.0–34.0)
MCHC: 31.3 g/dL (ref 30.0–36.0)
MCV: 82.9 fL (ref 78.0–100.0)
Platelets: 213 10*3/uL (ref 150–400)
RBC: 4.97 MIL/uL (ref 3.87–5.11)
RDW: 15.9 % — AB (ref 11.5–15.5)
WBC: 11 10*3/uL — AB (ref 4.0–10.5)

## 2017-09-15 LAB — BASIC METABOLIC PANEL
ANION GAP: 9 (ref 5–15)
BUN: <5 mg/dL — ABNORMAL LOW (ref 8–23)
CALCIUM: 8.6 mg/dL — AB (ref 8.9–10.3)
CO2: 29 mmol/L (ref 22–32)
CREATININE: 0.57 mg/dL (ref 0.44–1.00)
Chloride: 105 mmol/L (ref 98–111)
GFR calc non Af Amer: >60 mL/min (ref >60)
Glucose, Bld: 90 mg/dL (ref 70–99)
Potassium: 3.4 mmol/L — ABNORMAL LOW (ref 3.5–5.1)
SODIUM: 143 mmol/L (ref 135–145)

## 2017-09-15 LAB — CULTURE, BLOOD (ROUTINE X 2)
Culture: NO GROWTH
Culture: NO GROWTH
Special Requests: ADEQUATE

## 2017-09-15 MED ORDER — POTASSIUM CHLORIDE CRYS ER 20 MEQ PO TBCR
40.0000 meq | EXTENDED_RELEASE_TABLET | Freq: Once | ORAL | Status: AC
  Administered 2017-09-15: 40 meq via ORAL
  Filled 2017-09-15: qty 2

## 2017-09-15 MED ORDER — DOXYCYCLINE HYCLATE 100 MG PO CAPS: 100.0000 mg | ORAL_CAPSULE | Freq: Two times a day (BID) | ORAL | 0 refills | Status: AC

## 2017-09-15 NOTE — Discharge Summary (Signed)
Physician Discharge Summary   Patient ID: [name] [name] MRN: [id number] DOB/AGE: [id number] 81 y.o.  Admit date: 09/10/2017 Discharge date: 09/15/2017  Primary Care Physician:  Kristian Covey, MD   Recommendations for Outpatient Follow-up:  1. Follow up with PCP in 1-2 weeks 2. New medication: Doxycycline 100 mg twice a day for 7 days   Home Health: Patient is being discharged to skilled nursing facility Equipment/Devices: None  Discharge Condition: stable  CODE STATUS: DNR Diet recommendation: Dysphagia 2 diet with thin liquids   Discharge Diagnoses:    . Sepsis (HCC) secondary to right lower extremity cellulitis . Essential hypertension . Protein-calorie malnutrition, severe (HCC) . Sacral decubitus ulcer stage IV Advanced dementia Oropharyngeal dysphagia Generalized debility   Consults: None    Allergies:   Allergies  Allergen Reactions  . Augmentin [Amoxicillin-Pot Clavulanate] Other (See Comments)    Per MAR  . Codeine Sulfate Other (See Comments)    stomach pains  . Tramadol Hcl Other (See Comments)    stomach cramps     DISCHARGE MEDICATIONS: Allergies as of 09/15/2017      Reactions   Augmentin [amoxicillin-pot Clavulanate] Other (See Comments)   Per MAR   Codeine Sulfate Other (See Comments)   stomach pains   Tramadol Hcl Other (See Comments)   stomach cramps      Medication List    TAKE these medications   amLODipine 5 MG tablet Commonly known as:  NORVASC Take 5 mg by mouth daily.   ascorbic acid 500 MG tablet Commonly known as:  VITAMIN C Take 1 tablet (500 mg total) by mouth 2 (two) times daily.   atorvastatin 10 MG tablet Commonly known as:  LIPITOR Take 10 mg by mouth at bedtime.   cloNIDine 0.1 MG tablet Commonly known as:  CATAPRES Take 0.1 mg by mouth 2 (two) times daily.   donepezil 10 MG tablet Commonly known as:  ARICEPT Take 1 tablet (10 mg total) by mouth at bedtime.   doxycycline 100 MG  capsule Commonly known as:  VIBRAMYCIN Take 1 capsule (100 mg total) by mouth 2 (two) times daily for 7 days.   famotidine 20 MG tablet Commonly known as:  PEPCID One at bedtime What changed:    how much to take  how to take this  when to take this  additional instructions   feeding supplement (PRO-STAT SUGAR FREE 64) Liqd Take 30 mLs by mouth 3 (three) times daily.   fluticasone 50 MCG/ACT nasal spray Commonly known as:  FLONASE Place 2 sprays into both nostrils daily.   furosemide 20 MG tablet Commonly known as:  LASIX TAKE 1 TABLET (20 MG TOTAL) BY MOUTH DAILY.   hydrocerin Crea Apply 1 application topically See admin instructions. Apply daily to bilateral lower extremity after am care   ipratropium-albuterol 0.5-2.5 (3) MG/3ML Soln Commonly known as:  DUONEB Take 3 mLs by nebulization every 4 (four) hours as needed (shortness of breath/cough/wheezing).   ketoconazole 2 % shampoo Commonly known as:  NIZORAL Apply 1 application topically 2 (two) times a week. Apply to scalp for seborrheic dermatitis   loratadine 10 MG tablet Commonly known as:  CLARITIN Take 10 mg by mouth daily.   losartan 50 MG tablet Commonly known as:  COZAAR TAKE 1 TABLET BY MOUTH DAILY   metoprolol succinate 25 MG 24 hr tablet Commonly known as:  TOPROL-XL Take 1 tablet (25 mg total) by mouth daily.   multivitamin with minerals Tabs tablet Take 1 tablet by  mouth daily. Centrum   NUTRITIONAL SUPPLEMENT PO Take 120 mLs by mouth 3 (three) times daily.   nystatin powder Generic drug:  nystatin Apply topically See admin instructions. Apply powder to abdominal folds daily after bath for irritated skin   omeprazole 40 MG capsule Commonly known as:  PRILOSEC Take 40 mg by mouth at bedtime. For GERD   Vitamin D (Ergocalciferol) 50000 units Caps capsule Commonly known as:  DRISDOL Take 50,000 Units by mouth every 30 (thirty) days. On the 15th of each month        Brief H and  P: For complete details please refer to admission H and P, but in brief 83 year old American female with a past medical history of advanced dementia, essential hypertension, hyperlipidemia, chronic sacral wound, diabetic colostomy presented from skilled nursing facility due to decreased appetite nausea vomiting and fever. There was concern for a urinary tract infection along with cellulitis in the right thigh area. Her sacral wound was noted to be stable without active signs of infection. Patient was hospitalized for further management.  Hospital Course:  sepsis likely due to cellulitis right lower extremity -Patient presented with sepsis, 100.7, tachycardia heart rate 100, BP 108/73 at the time of admission -Sepsis physiology appears to have resolved.  -Patient was started on IV vancomycin and Zosyn.  MRSA PCR negative, hence deescalated to IV Zosyn.  At the time of discharge, transition to oral doxycycline for 7 days.   Right lower extremity cellulitis -Improving, MRI of the right hip was obtained which showed subcutaneous edema but negative for any abscess negative for osteomyelitis. -Continue doxycycline for 7 days.  colonization in the setting of chronic indwelling Foley - Patient was initially placed on IV vancomycin and Zosyn, then de-escalated to IV Zosyn.  Urine culture however showed only multiple species, no bacteria.  Stage IV sacral decubitus, present on admission -Continue wound care, has diverting colostomy  Advanced dementia/oropharyngeal dysphagia -Currently at baseline, continue home medications -  continue dysphagia 2 diet   Severe protein calorie malnutrition -Continue nutritional supplements, dietitian consulted  Essential hypertension -Initially held due to sepsis, resumed on 6/30 -BP currently stable, continue amlodipine, losartan, metoprolol, clonidine, Lasix      Day of Discharge S: Feeling better today, denies any specific complaints, no  acute issues overnight.  No fevers or chills.  BP 137/86 (BP Location: Right Arm)   Pulse 62   Temp 97.6 F (36.4 C) (Oral)   Resp 14   Wt 68.1 kg (150 lb 2.1 oz)   SpO2 100%   BMI 24.98 kg/m   Physical Exam: General: Alert and awake oriented x self, dementia HEENT: anicteric sclera, pupils reactive to light and accommodation CVS: S1-S2 clear no murmur rubs or gallops Chest: clear to auscultation bilaterally, no wheezing rales or rhonchi Abdomen: soft nontender, nondistended, normal bowel sounds Extremities: no cyanosis, clubbing or edema noted bilaterally, erythema over the right upper thigh improving, no fluctuation Neuro: no neuro deficits   The results of significant diagnostics from this hospitalization (including imaging, microbiology, ancillary and laboratory) are listed below for reference.      Procedures/Studies:  Mr Hip Right W Wo Contrast  Result Date: 09/14/2017 CLINICAL DATA:  Right lower extremity cellulitis centered about the right hip. EXAM: MRI OF THE RIGHT HIP WITHOUT AND WITH CONTRAST TECHNIQUE: Multiplanar, multisequence MR imaging was performed both before and after administration of intravenous contrast. CONTRAST:  15 ml MULTIHANCE GADOBENATE DIMEGLUMINE 529 MG/ML IV SOLN COMPARISON:  CT abdomen and pelvis 08/10/2017.  FINDINGS: Bones: There is no marrow signal abnormality to suggest osteomyelitis. No fracture is identified. Artifact from a right hip replacement is noted. The patient has advanced left hip osteoarthritis. Articular cartilage and labrum Articular cartilage: Degenerated on the left. Not applicable on the right. Labrum:  Appears intact. Joint or bursal effusion Joint effusion:  None. Bursae: Negative. Muscles and tendons Muscles and tendons: All imaged musculature demonstrates fatty atrophy. No intramuscular fluid collection is identified. Mildly increased T2 signal is seen in the gluteus minimus and medius muscles bilaterally. There is also some mild  edema in the adductor musculature bilaterally. There may be mild edema in the right vastus intermedius although this could be artifactual and secondary to the patient's hip replacement. Other findings Miscellaneous: Edema in subcutaneous fatty tissues is present bilaterally about the hips and pelvis, more extensive on the right. There is also some edema in the medial subcutaneous fatty tissues of the upper legs bilaterally. Imaged intrapelvic contents demonstrate a Foley catheter in place. The patient is status post hysterectomy. IMPRESSION: Subcutaneous edema about the hips and proximal thighs and buttocks is much worse on the right and compatible with cellulitis and/or dependent change. Negative for abscess. Negative for osteomyelitis. No hip joint effusion is identified to suggest septic joint. Mildly increased T2 signal in scattered musculature bilaterally could be due to myositis but given its symmetry is likely secondary to atrophy. No intramuscular fluid collection is identified. Electronically Signed   By: Drusilla Kannerhomas  Dalessio M.D.   On: 09/14/2017 14:09   Dg Chest Portable 1 View  Result Date: 09/10/2017 CLINICAL DATA:  Sepsis, fever EXAM: PORTABLE CHEST 1 VIEW COMPARISON:  03/23/2017 chest radiograph. FINDINGS: Stable cardiomediastinal silhouette with normal heart size. No pneumothorax. No pleural effusion. No pulmonary edema. Mild left basilar scarring versus atelectasis. No acute consolidative airspace disease. IMPRESSION: Mild left basilar scarring versus atelectasis. Otherwise no active disease. Electronically Signed   By: Delbert PhenixJason A Poff M.D.   On: 09/10/2017 16:59      LAB RESULTS: Basic Metabolic Panel: Recent Labs  Lab 09/14/17 0640 09/15/17 0408  NA 141 143  K 3.3* 3.4*  CL 106 105  CO2 28 29  GLUCOSE 96 90  BUN <5* <5*  CREATININE 0.64 0.57  CALCIUM 8.2* 8.6*   Liver Function Tests: Recent Labs  Lab 09/10/17 1600 09/11/17 0700  AST 22 20  ALT 13 12  ALKPHOS 76 70  BILITOT  0.5 0.7  PROT 6.4* 5.7*  ALBUMIN 2.9* 2.6*   No results for input(s): LIPASE, AMYLASE in the last 168 hours. No results for input(s): AMMONIA in the last 168 hours. CBC: Recent Labs  Lab 09/10/17 1600  09/14/17 0640 09/15/17 0408  WBC 13.7*   < > 9.0 11.0*  NEUTROABS 11.2*  --   --   --   HGB 14.0   < > 13.6 12.9  HCT 44.6   < > 43.0 41.2  MCV 83.1   < > 81.9 82.9  PLT 217   < > 199 213   < > = values in this interval not displayed.   Cardiac Enzymes: No results for input(s): CKTOTAL, CKMB, CKMBINDEX, TROPONINI in the last 168 hours. BNP: Invalid input(s): POCBNP CBG: No results for input(s): GLUCAP in the last 168 hours.    Disposition and Follow-up: Discharge Instructions    Diet - low sodium heart healthy   Complete by:  As directed    Increase activity slowly   Complete by:  As directed  DISPOSITION: Skilled nursing facility  DISCHARGE FOLLOW-UP  Contact information for follow-up providers    Kristian Covey, MD. Schedule an appointment as soon as possible for a visit in 2 week(s).   Specialty:  Family Medicine Contact information: [address] [place] [address] [phone]            Contact information for after-discharge care    Destination    [facility] .   Service:  Skilled Nursing Contact information: [address] [place] [address] [phone]                   Time coordinating discharge:  35 minutes  Signed:   Thad Ranger M.D. [facility] 09/15/2017, 10:51 AM Pager: [phone]

## 2017-09-15 NOTE — Progress Notes (Signed)
Patient was discharged to nursing home ([facility]) by MD order; discharged instructions review and sent to facility with care notes and prescription via [facility]; IV DIC; dressing on sacral area changed; facility was called and report was given to the nurse who is going to receive the patient; patient will be transported to facility via [facility]. Patient's husband at bedside.

## 2017-09-15 NOTE — Progress Notes (Signed)
Patient will discharge to [facility] Anticipated discharge date: 7/4 Family notified: pt spouse Transportation by [facility]- scheduled for 2:30pm Report #: [phone]  CSW signing off.  Burna SisJenna H. Caylan Chenard, LCSW Clinical Social Worker [phone]

## 2017-09-15 NOTE — Progress Notes (Signed)
Physical Therapy Treatment Patient Details Name: [name] FlavinShirley M [name] MRN: [id number] DOB: 1934-09-04 Today's Date: 09/15/2017    History of Present Illness [name] [name] is a 83 y.o. female with medical history significant of advanced dementia, HTN, HLD, chronic sacral wound, who is being brought from her long term SNF due to decreased appetite, nausea / vomiting the day prior and fever.    [name] Comments    [name] is limited in her ability to participate in therapy due to increased stiffness in trunk, hips, knees and ankles. [name] able to follow one step commands with tactile cuing for AAROM of UE and for rolling side to side with maxAx2. [name] performed PROM on LE.  Given [name]'s stiffness and pain with motion of LE and trunk  did not attempt to bring into sitting today. [name] will continue to work on progressing movement in 4 extremities for [name] to assist in bed mobility at her SNF at d/c.    Follow Up Recommendations  SNF     Equipment Recommendations  Other (comment);Wheelchair (measurements [name]);Wheelchair cushion (measurements [name])(lift equipment, air mattress overlay)    Recommendations for Other Services       Precautions / Restrictions Precautions Precautions: Fall Restrictions Weight Bearing Restrictions: No    Mobility  Bed Mobility Overal bed mobility: Needs Assistance Bed Mobility: Rolling Rolling: Max assist;+2 for physical assistance         General bed mobility comments: with verbal cuing and hand over hand assist for reaching across body to bed rail [name] able to participate in pulling onto her side both R and L, however stiffness in trunk and LE required maxAx2 for assistance into sidelying                       Cognition Arousal/Alertness: Awake/alert Behavior During Therapy: Flat affect Overall Cognitive Status: No family/caregiver present to determine baseline cognitive functioning                                 General Comments: Very likely at  baseline      Exercises General Exercises - Lower Extremity Straight Leg Raises: PROM;Both;5 reps;Supine Low Level/ICU Exercises Ankle Circles/Pumps: Both;10 reps;Supine;PROM Heel Slides: PROM;Both;5 reps;Supine Shoulder Flexion: AAROM;Both;5 reps;Supine Elbow Flexion: AAROM;5 reps;Both;Supine    General Comments  VSS      Pertinent Vitals/Pain Pain Assessment: Faces Faces Pain Scale: Hurts little more Pain Location: Grimace with LE PROM Pain Descriptors / Indicators: Grimacing Pain Intervention(s): Limited activity within patient's tolerance;Monitored during session;Repositioned    Home Living Family/patient expects to be discharged to:: Skilled nursing facility                    Prior Function Level of Independence: Needs assistance      Comments: [name] unable to give information; per chart review, [name] is largely bedbound   [name] Goals (current goals can now be found in the care plan section) Acute Rehab [name] Goals [name] Goal Formulation: Patient unable to participate in goal setting Time For Goal Achievement: 09/25/17 Potential to Achieve Goals: Fair Progress towards [name] goals: Progressing toward goals    Frequency    Min 2X/week(Likely one more session)      [name] Plan Current plan remains appropriate       AM-PAC [name] "6 Clicks" Daily Activity  Outcome Measure  Difficulty turning over in bed (including adjusting bedclothes, sheets and blankets)?: Unable Difficulty  moving from lying on back to sitting on the side of the bed? : Unable Difficulty sitting down on and standing up from a chair with arms (e.g., wheelchair, bedside commode, etc,.)?: Unable Help needed moving to and from a bed to chair (including a wheelchair)?: Total Help needed walking in hospital room?: Total Help needed climbing 3-5 steps with a railing? : Total 6 Click Score: 6    End of Session Equipment Utilized During Treatment: Other (comment)(bed pad) Activity Tolerance: Patient tolerated  treatment well Patient left: in bed;with call bell/phone within reach;with nursing/sitter in room;with bed alarm set Nurse Communication: Mobility status [name] Visit Diagnosis: Muscle weakness (generalized) (M62.81);Other abnormalities of gait and mobility (R26.89);Adult, failure to thrive (R62.7)     Time: 1324-4010 [name] Time Calculation (min) (ACUTE ONLY): 13 min  Charges:  $Therapeutic Exercise: 8-22 mins                    G Codes:       [name] [name], [name] [name] Acute Rehabilitation  [phone] Pager [phone]     [name] [name] Fleet 09/15/2017, 10:42 AM

## 2017-09-16 ENCOUNTER — Telehealth: Payer: Self-pay | Admitting: Family Medicine

## 2017-09-16 NOTE — Telephone Encounter (Signed)
Erroneous Encounter - D/C'd to SNF  Nothing further needed.

## 2017-09-20 ENCOUNTER — Encounter (HOSPITAL_BASED_OUTPATIENT_CLINIC_OR_DEPARTMENT_OTHER): Payer: Medicare Other | Attending: Internal Medicine

## 2017-09-20 DIAGNOSIS — L89154 Pressure ulcer of sacral region, stage 4: Secondary | ICD-10-CM | POA: Diagnosis present

## 2017-09-20 DIAGNOSIS — Z8614 Personal history of Methicillin resistant Staphylococcus aureus infection: Secondary | ICD-10-CM | POA: Insufficient documentation

## 2017-09-20 DIAGNOSIS — F0390 Unspecified dementia without behavioral disturbance: Secondary | ICD-10-CM | POA: Diagnosis not present

## 2017-10-18 ENCOUNTER — Encounter (HOSPITAL_BASED_OUTPATIENT_CLINIC_OR_DEPARTMENT_OTHER): Payer: Medicare Other

## 2017-10-21 ENCOUNTER — Encounter (HOSPITAL_BASED_OUTPATIENT_CLINIC_OR_DEPARTMENT_OTHER): Payer: Self-pay

## 2017-10-21 ENCOUNTER — Encounter (HOSPITAL_BASED_OUTPATIENT_CLINIC_OR_DEPARTMENT_OTHER): Payer: Medicare Other | Attending: Internal Medicine

## 2017-10-21 DIAGNOSIS — F0390 Unspecified dementia without behavioral disturbance: Secondary | ICD-10-CM | POA: Insufficient documentation

## 2017-10-21 DIAGNOSIS — Z87891 Personal history of nicotine dependence: Secondary | ICD-10-CM | POA: Insufficient documentation

## 2017-10-21 DIAGNOSIS — I10 Essential (primary) hypertension: Secondary | ICD-10-CM | POA: Insufficient documentation

## 2017-10-21 DIAGNOSIS — L89154 Pressure ulcer of sacral region, stage 4: Secondary | ICD-10-CM | POA: Diagnosis present

## 2017-10-21 DIAGNOSIS — Z96641 Presence of right artificial hip joint: Secondary | ICD-10-CM | POA: Insufficient documentation

## 2017-11-10 DIAGNOSIS — L89154 Pressure ulcer of sacral region, stage 4: Secondary | ICD-10-CM | POA: Diagnosis not present

## 2017-12-08 ENCOUNTER — Encounter (HOSPITAL_BASED_OUTPATIENT_CLINIC_OR_DEPARTMENT_OTHER): Payer: Medicare Other | Attending: Internal Medicine

## 2017-12-08 DIAGNOSIS — F0390 Unspecified dementia without behavioral disturbance: Secondary | ICD-10-CM | POA: Diagnosis not present

## 2017-12-08 DIAGNOSIS — I10 Essential (primary) hypertension: Secondary | ICD-10-CM | POA: Diagnosis not present

## 2017-12-08 DIAGNOSIS — L89154 Pressure ulcer of sacral region, stage 4: Secondary | ICD-10-CM | POA: Insufficient documentation

## 2017-12-08 DIAGNOSIS — L89312 Pressure ulcer of right buttock, stage 2: Secondary | ICD-10-CM | POA: Insufficient documentation

## 2018-01-05 ENCOUNTER — Encounter (HOSPITAL_BASED_OUTPATIENT_CLINIC_OR_DEPARTMENT_OTHER): Payer: Medicare Other | Attending: Internal Medicine

## 2018-01-05 DIAGNOSIS — L89312 Pressure ulcer of right buttock, stage 2: Secondary | ICD-10-CM | POA: Insufficient documentation

## 2018-01-05 DIAGNOSIS — I10 Essential (primary) hypertension: Secondary | ICD-10-CM | POA: Diagnosis not present

## 2018-01-05 DIAGNOSIS — F0390 Unspecified dementia without behavioral disturbance: Secondary | ICD-10-CM | POA: Diagnosis not present

## 2018-01-05 DIAGNOSIS — L89154 Pressure ulcer of sacral region, stage 4: Secondary | ICD-10-CM | POA: Diagnosis present

## 2018-02-03 ENCOUNTER — Encounter (HOSPITAL_BASED_OUTPATIENT_CLINIC_OR_DEPARTMENT_OTHER): Payer: Medicare Other | Attending: Internal Medicine

## 2018-02-03 DIAGNOSIS — L89154 Pressure ulcer of sacral region, stage 4: Secondary | ICD-10-CM | POA: Insufficient documentation

## 2018-02-03 DIAGNOSIS — I10 Essential (primary) hypertension: Secondary | ICD-10-CM | POA: Diagnosis not present

## 2018-02-03 DIAGNOSIS — F0390 Unspecified dementia without behavioral disturbance: Secondary | ICD-10-CM | POA: Insufficient documentation

## 2018-02-07 ENCOUNTER — Non-Acute Institutional Stay: Payer: Medicare Other | Admitting: Internal Medicine

## 2018-02-07 ENCOUNTER — Encounter: Payer: Self-pay | Admitting: Internal Medicine

## 2018-02-07 DIAGNOSIS — Z515 Encounter for palliative care: Secondary | ICD-10-CM

## 2018-02-12 NOTE — Progress Notes (Signed)
No charge; appointment made in error Mary Serpe NP-C 

## 2018-02-16 ENCOUNTER — Non-Acute Institutional Stay: Payer: Medicare Other | Admitting: Internal Medicine

## 2018-02-16 DIAGNOSIS — Z515 Encounter for palliative care: Secondary | ICD-10-CM

## 2018-02-16 NOTE — Progress Notes (Signed)
No charge. Did not see patient. Ayah Cozzolino NP-C 

## 2018-02-20 ENCOUNTER — Non-Acute Institutional Stay: Payer: Medicare Other | Admitting: Internal Medicine

## 2018-02-20 ENCOUNTER — Encounter: Payer: Self-pay | Admitting: Internal Medicine

## 2018-02-20 VITALS — BP 140/90 | HR 96 | Resp 20 | Wt 113.8 lb

## 2018-02-20 DIAGNOSIS — F0390 Unspecified dementia without behavioral disturbance: Secondary | ICD-10-CM

## 2018-02-20 DIAGNOSIS — R634 Abnormal weight loss: Secondary | ICD-10-CM

## 2018-02-20 NOTE — Progress Notes (Signed)
[facility] Telephone: [phone]) [phone] Fax: [phone]  PATIENT NAME: [name] FlavinShirley M Enslin DOB: 09/13/1934 MRN: [id number]  PRIMARY CARE PROVIDER:   Dr. Donley RedderPolite REFERRING PROVIDER:  Kristian CoveyBurchette, Bruce W, MD [address] [place], [place] [address]  Joetta MannersBlumenthal 09/25/2017 [employer] care: Teah Doles-Johnson NP  [phone] RESPONSIBLE PARTY:   husband, [name] Frank Summey [phone]  INTERVAL HISTORY/ HISTORY OF PRESENT ILLNESS: Hospital  Ms. [name] Frank is an 83 yo female with h/o advanced dementia, hypertension, protein calorie malnutrition, chronic stage IV sacral decubitus ulcer with history of osteomyelitis, hyperlipidemia, and hypertension. She has a colostomy. Last seen by Palliative Care 7 months earlier. Since then, patient has had a hospitalization 6/29-09/15/17 when she was treated for sepsis secondary to right lower extremity cellulitis.Routine follow up visit made today.    IMPRESSION / RECOMMENDATIONS:  1. Dementia: Slowly progressive. PPS 30%. FAST level 7a. She is totally dependent for hygiene, dressing, and feeding. She has a foley catheter and colostomy bag. She is constantly confused. Husband believes she recognizes him. She is episodically agitated. She speech is soft and garbled, though husband states she is able to carry on a brief conversation with him. She has episodic hallucinations  (sees her deceased dad) but is easily redirected. Patient is a heavy 2 person transfer and is non-weight bearing. Husband states her skin is intact. She needs to be fed her pureed diet, but can manage to drink from a cup with cuing and guidance.   2. Family education:  -Spent some time with husband  [name] Frank discussing nature of dementia; that it is a progressive disease and can be physically wasting as well. [name] Frank initially was attributing patient's decline on facility care, but he does acknowledge the contribution of the disease to her decline. We discussed the Aricept patient is on, and that it  likely is not slowing the progression of the dementia at this late stage.  -Reviewed in some detail, and I wrote out the list, of medications patient is on and their indications.   3. Weight loss: PCG reports visible loss of weight he has observed in patient.  Current weight is 113.8lbs, a loss of 32.2lbs (22% of body weight) over the last 7 months. PCG attribute this to patient's pureed diet, which she eats about 25% of. [name] Frank brings food from [employer] and [employer] with a fork. He says that patient eats 100% of this. She sometimes couch with solids. He would like her diet advanced. We discussed benefits vs burdens of diet liberalization with likely nutritional improvement vs increased aspiration risk. He would still like to advance diet.  -discussed with NP Teah. She will follow up when she rounds in 2 days. Facility may require that [name] Frank sign a waver documenting that he understands the increased aspiration risk with diet advancement.   4. Advanced care directives: DNR on chart; [name] Frank confirmed this wish.   5. Follow up: NP visit in 3-4 months.    I spent 55 minutes providing this consultation,  from 3pm to 3:55pm. More than 50% of this was spent coordinating communication.    CODE STATUS: DNR  PPS: 30%  HOSPICE ELIGIBILITY/DIAGNOSIS: TBD  PAST MEDICAL HISTORY:  Past Medical History:  Diagnosis Date  . Allergy   . Anxiety   . Arthritis    degenerative oateoarthritis  . Decubital ulcer 11/14/2014  . Dementia (HCC)   . GERD (gastroesophageal reflux disease)   . Hyperlipidemia   . Hypertension   . Neurogenic urinary incontinence    since  back surgery  . Sacral wound 09/2017  . Urinary frequency     SOCIAL HX:  Social History   Tobacco Use  . Smoking status: Former Smoker    Packs/day: 0.50    Years: 10.00    Pack years: 5.00    Types: Cigarettes    Last attempt to quit: 09/23/1983    Years since quitting: 34.4  . Smokeless tobacco: Never Used  Substance Use Topics  .  Alcohol use: No    ALLERGIES:  Allergies  Allergen Reactions  . Augmentin [Amoxicillin-Pot Clavulanate] Other (See Comments)    Per MAR  . Codeine Sulfate Other (See Comments)    stomach pains  . Tramadol Hcl Other (See Comments)    stomach cramps     PERTINENT MEDICATIONS:  Outpatient Encounter Medications as of 02/20/2018  Medication Sig  . amLODipine (NORVASC) 5 MG tablet Take 5 mg by mouth daily.  Marland Kitchen atorvastatin (LIPITOR) 10 MG tablet Take 10 mg by mouth at bedtime.   . cloNIDine (CATAPRES) 0.1 MG tablet Take 0.1 mg by mouth 2 (two) times daily.  Marland Kitchen docusate sodium (COLACE) 100 MG capsule Take 100 mg by mouth 2 (two) times daily.  Marland Kitchen donepezil (ARICEPT) 10 MG tablet Take 1 tablet (10 mg total) by mouth at bedtime.  . famotidine (PEPCID) 20 MG tablet One at bedtime (Patient taking differently: Take 20 mg by mouth at bedtime. )  . fluticasone (FLONASE) 50 MCG/ACT nasal spray Place 2 sprays into both nostrils daily.  . furosemide (LASIX) 20 MG tablet TAKE 1 TABLET (20 MG TOTAL) BY MOUTH DAILY.  . hydrocerin (EUCERIN) CREA Apply 1 application topically See admin instructions. Apply daily to bilateral lower extremity after am care  . ipratropium-albuterol (DUONEB) 0.5-2.5 (3) MG/3ML SOLN Take 3 mLs by nebulization every 4 (four) hours as needed (shortness of breath/cough/wheezing).   Marland Kitchen ketoconazole (NIZORAL) 2 % shampoo Apply 1 application topically 2 (two) times a week. Apply to scalp for seborrheic dermatitis  . loratadine (CLARITIN) 10 MG tablet Take 10 mg by mouth daily.  Marland Kitchen losartan (COZAAR) 50 MG tablet TAKE 1 TABLET BY MOUTH DAILY  . metoprolol succinate (TOPROL-XL) 25 MG 24 hr tablet Take 1 tablet (25 mg total) by mouth daily.  . Multiple Vitamin (MULTIVITAMIN WITH MINERALS) TABS tablet Take 1 tablet by mouth daily. Centrum  . Nutritional Supplements (NUTRITIONAL SUPPLEMENT PO) Take 120 mLs by mouth 3 (three) times daily.  Marland Kitchen nystatin (NYSTATIN) powder Apply topically See admin  instructions. Apply powder to abdominal folds daily after bath for irritated skin  . omeprazole (PRILOSEC) 40 MG capsule Take 40 mg by mouth at bedtime. For GERD  . vitamin C (VITAMIN C) 500 MG tablet Take 1 tablet (500 mg total) by mouth 2 (two) times daily.  . Vitamin D, Ergocalciferol, (DRISDOL) 50000 units CAPS capsule Take 50,000 Units by mouth every 30 (thirty) days. On the 15th of each month   No facility-administered encounter medications on file as of 02/20/2018.     PHYSICAL EXAM:  VS: BP 140/90, HR 96, RR 20 General: NAD, frail appearing, thin, elderly AA female sitting up inclined in bed. She has a sweet smile and engages eye contact. She was unable to follow simple commands for me. Cardiovascular: regular rate and rhythm Pulmonary: clear ant fields Abdomen: soft, nontender, + bowel sounds. Colostomy bag with soft brown stool.  GU: foley catheter intact Extremities: no edema, no joint deformities Skin: no rashes or breakdown exposed areas Neurological: Weakness but otherwise  nonfocal  Julianne Handler, NP

## 2018-03-02 ENCOUNTER — Other Ambulatory Visit: Payer: Self-pay | Admitting: Internal Medicine

## 2018-03-03 ENCOUNTER — Encounter (HOSPITAL_BASED_OUTPATIENT_CLINIC_OR_DEPARTMENT_OTHER): Payer: Medicare Other | Attending: Internal Medicine

## 2018-03-03 DIAGNOSIS — F0390 Unspecified dementia without behavioral disturbance: Secondary | ICD-10-CM | POA: Insufficient documentation

## 2018-03-03 DIAGNOSIS — L89154 Pressure ulcer of sacral region, stage 4: Secondary | ICD-10-CM | POA: Diagnosis not present

## 2018-03-03 DIAGNOSIS — I10 Essential (primary) hypertension: Secondary | ICD-10-CM | POA: Diagnosis not present

## 2018-03-07 ENCOUNTER — Other Ambulatory Visit: Payer: Self-pay | Admitting: Internal Medicine

## 2018-03-07 DIAGNOSIS — M4628 Osteomyelitis of vertebra, sacral and sacrococcygeal region: Secondary | ICD-10-CM

## 2018-03-16 ENCOUNTER — Telehealth: Payer: Self-pay

## 2018-03-16 NOTE — Telephone Encounter (Signed)
Please see message. I sent one about her husband earlier that had this message on it. Does our office send out a condolence card?

## 2018-03-16 NOTE — Telephone Encounter (Signed)
Copied from CRM [id number]. Topic: General - Deceased Patient >> 03/18/18  2:52 PM Jolayne Haines L wrote: Patient's husband called to let Dr Caryl Never know she passed away on 2018-03-17.

## 2018-03-17 NOTE — Telephone Encounter (Signed)
A sympathy card has been signed and mailed to the patients husband Araminta Derhammer.

## 2018-03-17 NOTE — Telephone Encounter (Signed)
Yes.  I think that would be a good idea. I don't know that we do routinely but would like to get one for him.

## 2018-04-07 ENCOUNTER — Encounter (HOSPITAL_BASED_OUTPATIENT_CLINIC_OR_DEPARTMENT_OTHER): Payer: Medicare Other | Attending: Internal Medicine

## 2018-04-15 DEATH — deceased

## 2018-08-06 IMAGING — MR MR HIP*R* WO/W CM
9 series · 37 of 40 positions shown · IV contrast (multihance)
Comparison: CT abdomen and pelvis 08/10/2017.

CLINICAL DATA: Right lower extremity cellulitis centered about the
right hip.

EXAM:
MRI OF THE RIGHT HIP WITHOUT AND WITH CONTRAST
TECHNIQUE: Multiplanar, multisequence MR imaging was performed both before and
after administration of intravenous contrast.
CONTRAST:  15 ml MULTIHANCE GADOBENATE DIMEGLUMINE 529 MG/ML IV SOLN

[Series 7: t2_tse_stir_cor_high-bw_unilat · coronal · right · 3.0mm · 1.41mm/px · 4 of 30 slices shown (1 of 2)]
[im 1/30]
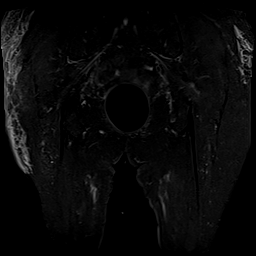
[im 10/30]
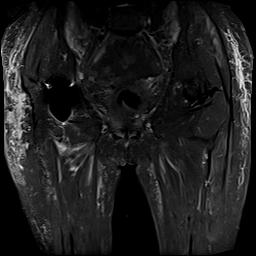
[im 20/30]
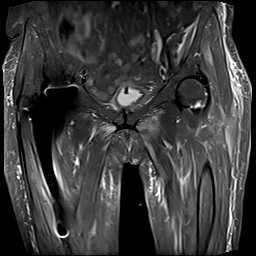
[im 30/30]
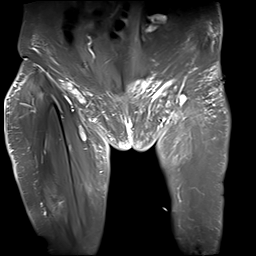

[Series 9: t2_tse_stir_cor_high-bw_unilat · axial · right · 5.0mm · 1.02mm/px · z∈[-248,+24]mm · 5 of 40 slices shown (2 of 2)]
[im 1/40]
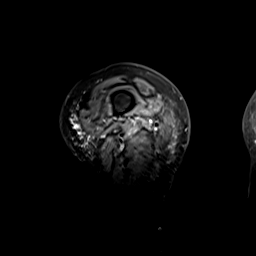
[im 10/40]
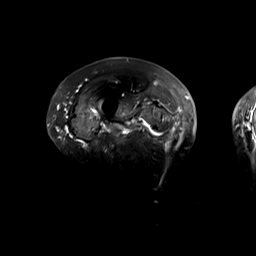
[im 20/40]
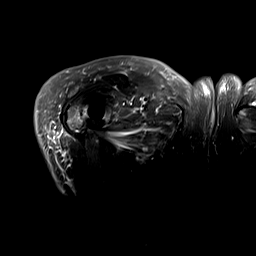
[im 30/40]
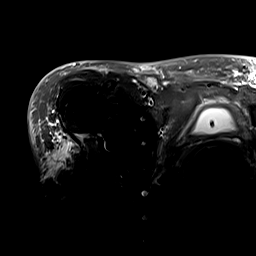
[im 40/40]
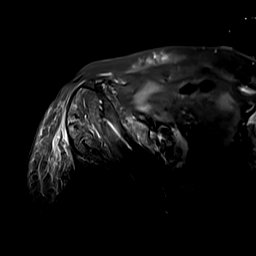

[Series 10: t1_tse_cor_high-bw_unilat · coronal · right · 3.0mm · 1.25mm/px · 4 of 29 slices shown]
[im 1/29]
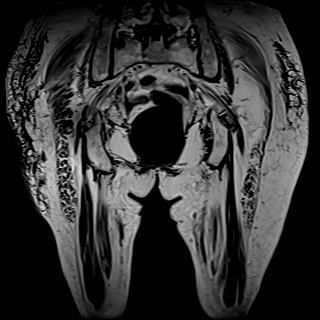
[im 10/29]
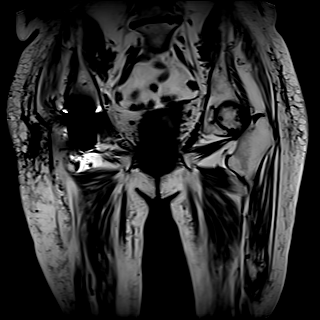
[im 19/29]
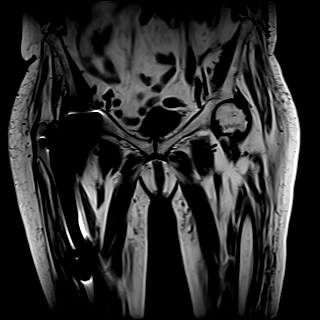
[im 29/29]
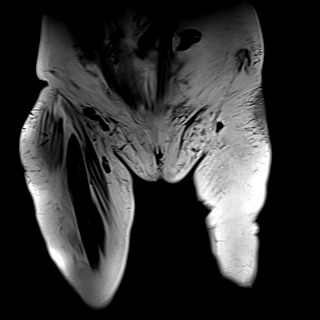

[Series 11: pd_tse_tra_high-bw_unilat · axial · right · 5.0mm · 0.68mm/px · z∈[-248,+24]mm · 5 of 40 slices shown]
[im 1/40]
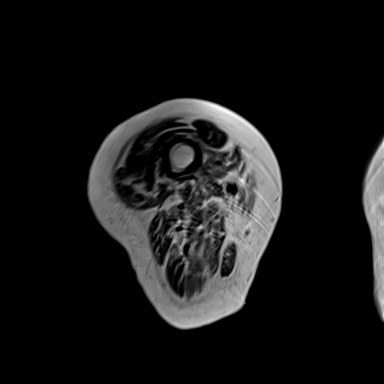
[im 10/40]
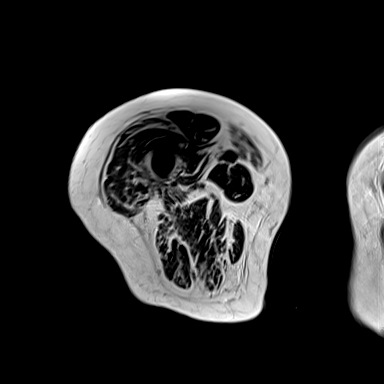
[im 20/40]
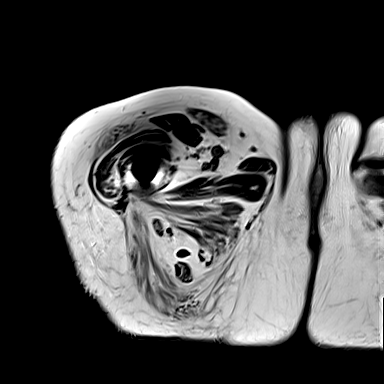
[im 30/40]
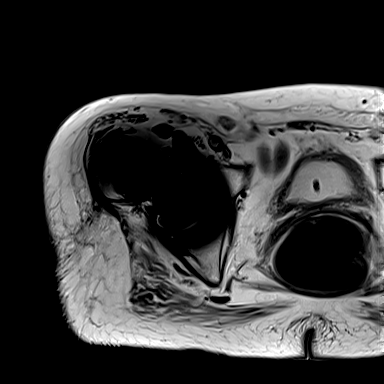
[im 40/40]
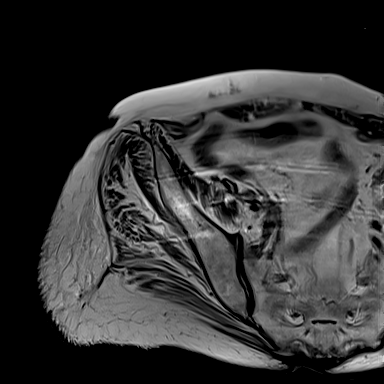

[Series 12: pd_tse_cor_high-bw_unilat · coronal · right · 3.0mm · 1.56mm/px · 4 of 36 slices shown]
[im 1/36]
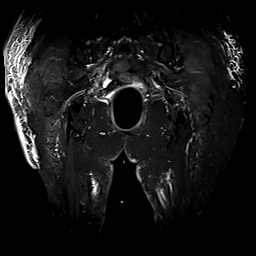
[im 12/36]
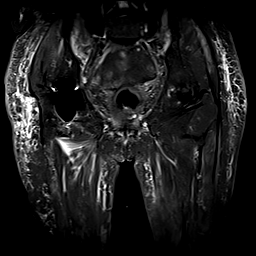
[im 24/36]
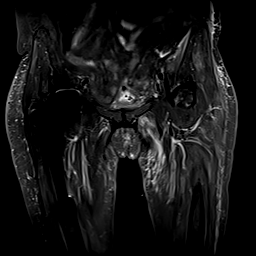
[im 36/36]
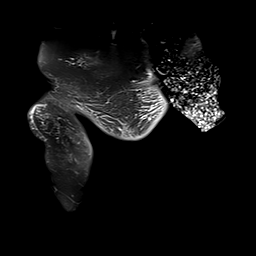

[Series 15: pd_tse_sag_high-bw_unilat · sagittal · right · 4.0mm · 0.94mm/px · 4 of 36 slices shown]
[im 1/36]
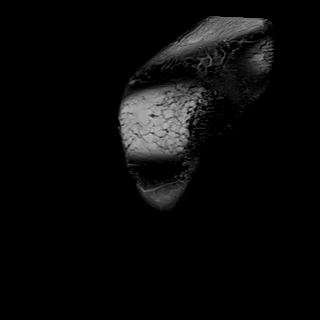
[im 12/36]
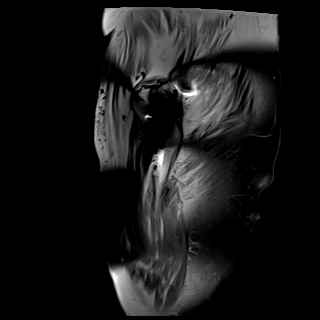
[im 24/36]
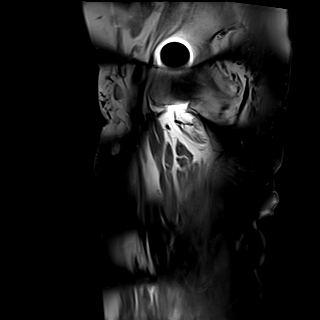
[im 36/36]
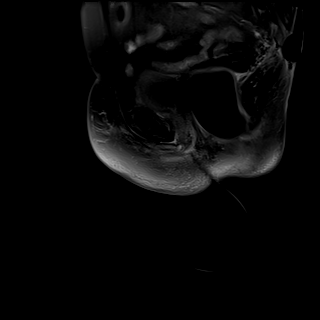

[Series 17: t1_tra_high-bw_bilat · axial · right · 5.0mm · 0.85mm/px · z∈[-252,+21]mm · 5 of 40 slices shown (1 of 3)]
[im 1/40]
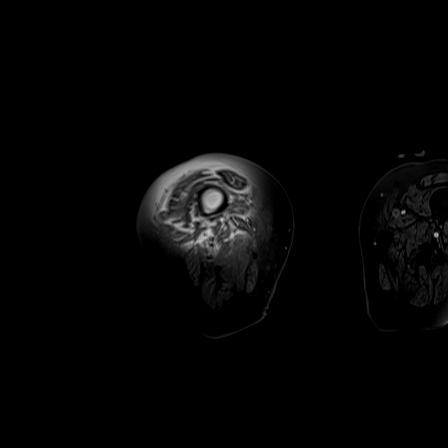
[im 10/40]
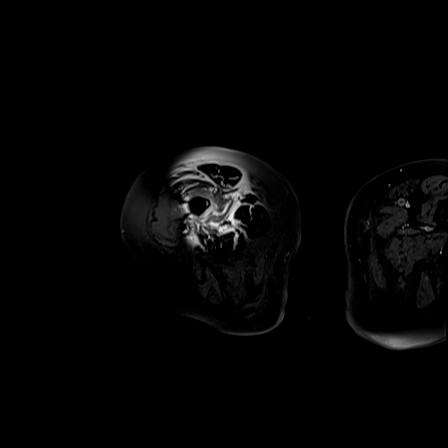
[im 20/40]
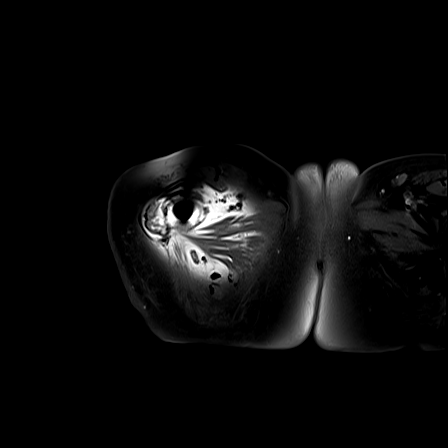
[im 30/40]
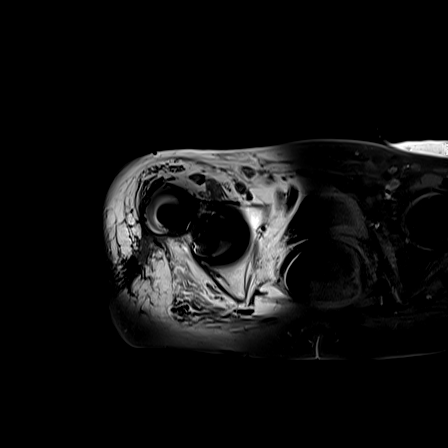
[im 40/40]
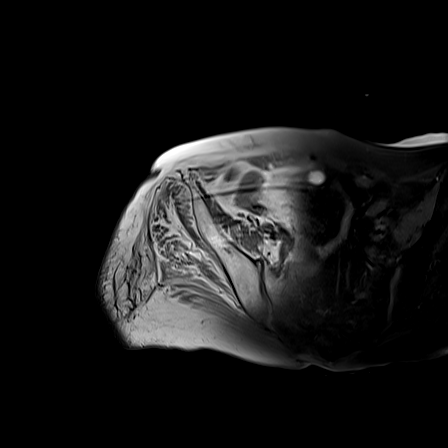

[Series 18: t1_tra_high-bw_bilat · axial · right · 5.0mm · 0.85mm/px · z∈[-252,+21]mm · 5 of 40 slices shown (2 of 3)]
[im 1/40]
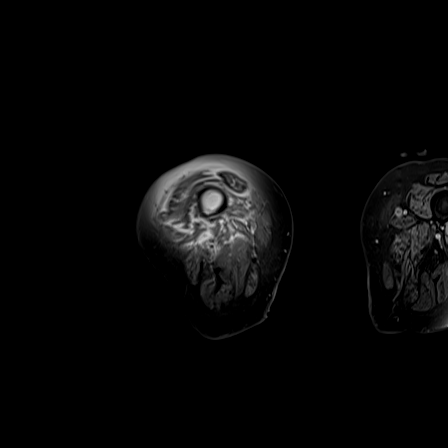
[im 10/40]
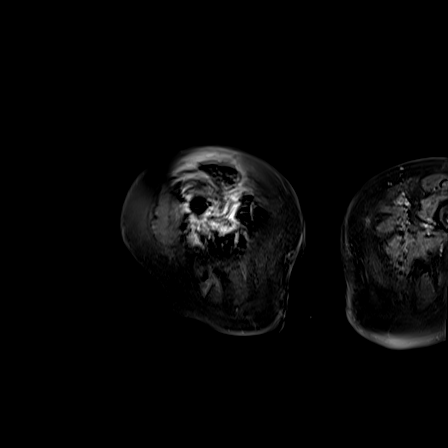
[im 20/40]
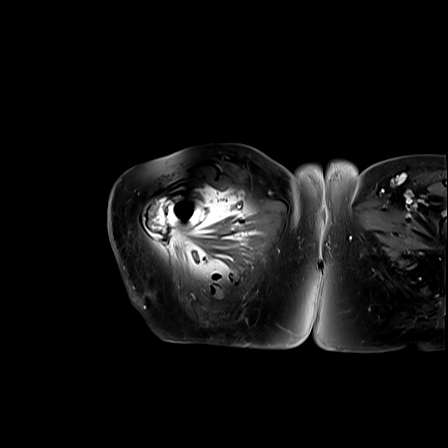
[im 30/40]
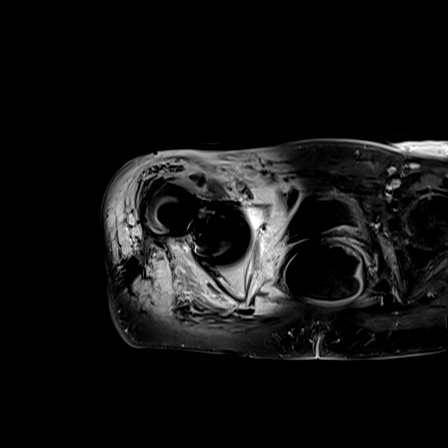
[im 40/40]
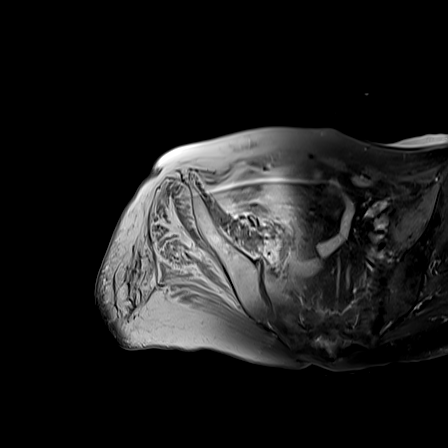

[Series 19: t1_tra_high-bw_bilat · coronal · right · 3.0mm · 0.89mm/px · 1 of 30 slices shown (3 of 3)]
[im 1/30]
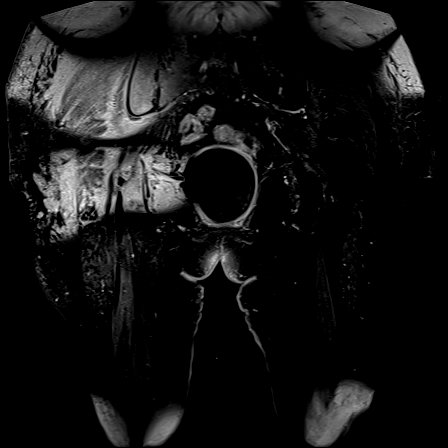

[37 of 40 positions shown; findings below may reference images not displayed]

FINDINGS: Bones: There is no marrow signal abnormality to suggest
osteomyelitis. No fracture is identified. Artifact from a right hip
replacement is noted. The patient has advanced left hip
osteoarthritis.

Articular cartilage and labrum

Articular cartilage: Degenerated on the left. Not applicable on the
right.

Labrum:  Appears intact.

Joint or bursal effusion

Joint effusion:  None.

Bursae: Negative.

Muscles and tendons

Muscles and tendons: All imaged musculature demonstrates fatty
atrophy. No intramuscular fluid collection is identified. Mildly
increased T2 signal is seen in the gluteus minimus and medius
muscles bilaterally. There is also some mild edema in the adductor
musculature bilaterally. There may be mild edema in the right vastus
intermedius although this could be artifactual and secondary to the
patient's hip replacement.

Other findings

Miscellaneous: Edema in subcutaneous fatty tissues is present
bilaterally about the hips and pelvis, more extensive on the right.
There is also some edema in the medial subcutaneous fatty tissues of
the upper legs bilaterally. Imaged intrapelvic contents demonstrate
a Foley catheter in place. The patient is status post hysterectomy.
IMPRESSION: Subcutaneous edema about the hips and proximal thighs and buttocks
is much worse on the right and compatible with cellulitis and/or
dependent change. Negative for abscess.

Negative for osteomyelitis. No hip joint effusion is identified to
suggest septic joint.

Mildly increased T2 signal in scattered musculature bilaterally
could be due to myositis but given its symmetry is likely secondary
to atrophy. No intramuscular fluid collection is identified.
